# Patient Record
Sex: Female | Born: 1945
Health system: Southern US, Community
[De-identification: ages and names within clinical notes are randomized; demographics above are authoritative.]

## PROBLEM LIST (undated history)

## (undated) DIAGNOSIS — E213 Hyperparathyroidism, unspecified: Secondary | ICD-10-CM

## (undated) DIAGNOSIS — E785 Hyperlipidemia, unspecified: Secondary | ICD-10-CM

## (undated) DIAGNOSIS — N183 Chronic kidney disease, stage 3 unspecified: Secondary | ICD-10-CM

## (undated) DIAGNOSIS — I10 Essential (primary) hypertension: Secondary | ICD-10-CM

## (undated) DIAGNOSIS — I700 Atherosclerosis of aorta: Secondary | ICD-10-CM

## (undated) DIAGNOSIS — I498 Other specified cardiac arrhythmias: Secondary | ICD-10-CM

## (undated) DIAGNOSIS — M810 Age-related osteoporosis without current pathological fracture: Secondary | ICD-10-CM

## (undated) DIAGNOSIS — K219 Gastro-esophageal reflux disease without esophagitis: Secondary | ICD-10-CM

## (undated) DIAGNOSIS — Z8673 Personal history of transient ischemic attack (TIA), and cerebral infarction without residual deficits: Secondary | ICD-10-CM

## (undated) DIAGNOSIS — M1990 Unspecified osteoarthritis, unspecified site: Secondary | ICD-10-CM

## (undated) DIAGNOSIS — F419 Anxiety disorder, unspecified: Secondary | ICD-10-CM

## (undated) DIAGNOSIS — R002 Palpitations: Secondary | ICD-10-CM

## (undated) DIAGNOSIS — E041 Nontoxic single thyroid nodule: Secondary | ICD-10-CM

## (undated) HISTORY — DX: Essential (primary) hypertension: I10

## (undated) HISTORY — PX: COLONOSCOPY: SHX174

## (undated) HISTORY — DX: Hyperlipidemia, unspecified: E78.5

## (undated) HISTORY — DX: Anxiety disorder, unspecified: F41.9

## (undated) HISTORY — DX: Gastro-esophageal reflux disease without esophagitis: K21.9

## (undated) HISTORY — PX: ESOPHAGOGASTRODUODENOSCOPY: SHX1529

## (undated) NOTE — *Deleted (*Deleted)
Health Maintenance Due  Topic Date Due  . COLONOSCOPY  05/31/2019  . INFLUENZA VACCINE  08/08/2019   Depression screen [facility] 2/9 07/19/2019 04/15/2019 10/29/2018  Decreased Interest 0 0 0  Down, Depressed, Hopeless 0 0 0  PHQ - 2 Score 0 0 0  Altered sleeping 0 0 0  Tired, decreased energy 0 0 3  Change in appetite 0 0 0  Feeling bad or failure about yourself  0 0 0  Trouble concentrating 0 0 0  Moving slowly or fidgety/restless 0 0 0  Suicidal thoughts 0 0 0  PHQ-9 Score 0 0 3  Difficult doing work/chores Not difficult at all Not difficult at all Not difficult at all  Some recent data might be hidden

---

## 1997-04-14 ENCOUNTER — Ambulatory Visit (HOSPITAL_COMMUNITY): Admission: RE | Admit: 1997-04-14 | Discharge: 1997-04-14 | Payer: Self-pay | Admitting: Obstetrics and Gynecology

## 1997-09-19 ENCOUNTER — Other Ambulatory Visit: Admission: RE | Admit: 1997-09-19 | Discharge: 1997-09-19 | Payer: Self-pay | Admitting: Gynecology

## 1998-03-29 ENCOUNTER — Other Ambulatory Visit: Admission: RE | Admit: 1998-03-29 | Discharge: 1998-03-29 | Payer: Self-pay | Admitting: Gynecology

## 1998-12-13 ENCOUNTER — Encounter: Payer: Self-pay | Admitting: *Deleted

## 1998-12-13 ENCOUNTER — Encounter: Admission: RE | Admit: 1998-12-13 | Discharge: 1998-12-13 | Payer: Self-pay | Admitting: *Deleted

## 1998-12-19 ENCOUNTER — Other Ambulatory Visit: Admission: RE | Admit: 1998-12-19 | Discharge: 1998-12-19 | Payer: Self-pay | Admitting: Gynecology

## 1999-12-05 ENCOUNTER — Encounter: Payer: Self-pay | Admitting: *Deleted

## 1999-12-05 ENCOUNTER — Encounter: Admission: RE | Admit: 1999-12-05 | Discharge: 1999-12-05 | Payer: Self-pay | Admitting: *Deleted

## 2000-03-25 ENCOUNTER — Other Ambulatory Visit: Admission: RE | Admit: 2000-03-25 | Discharge: 2000-03-25 | Payer: Self-pay | Admitting: Gynecology

## 2001-02-16 ENCOUNTER — Observation Stay (HOSPITAL_COMMUNITY): Admission: RE | Admit: 2001-02-16 | Discharge: 2001-02-17 | Payer: Self-pay | Admitting: Neurosurgery

## 2001-04-21 ENCOUNTER — Other Ambulatory Visit: Admission: RE | Admit: 2001-04-21 | Discharge: 2001-04-21 | Payer: Self-pay | Admitting: Gynecology

## 2002-01-07 HISTORY — PX: CERVICAL LAMINECTOMY: SHX94

## 2002-06-15 ENCOUNTER — Other Ambulatory Visit: Admission: RE | Admit: 2002-06-15 | Discharge: 2002-06-15 | Payer: Self-pay | Admitting: Gynecology

## 2003-07-21 ENCOUNTER — Other Ambulatory Visit: Admission: RE | Admit: 2003-07-21 | Discharge: 2003-07-21 | Payer: Self-pay | Admitting: Gynecology

## 2004-04-02 ENCOUNTER — Ambulatory Visit: Payer: Self-pay | Admitting: Gastroenterology

## 2004-08-30 ENCOUNTER — Other Ambulatory Visit: Admission: RE | Admit: 2004-08-30 | Discharge: 2004-08-30 | Payer: Self-pay | Admitting: Gynecology

## 2005-09-16 ENCOUNTER — Encounter: Admission: RE | Admit: 2005-09-16 | Discharge: 2005-09-16 | Payer: Self-pay | Admitting: Internal Medicine

## 2006-05-26 ENCOUNTER — Ambulatory Visit: Payer: Self-pay | Admitting: Internal Medicine

## 2006-05-26 DIAGNOSIS — K219 Gastro-esophageal reflux disease without esophagitis: Secondary | ICD-10-CM | POA: Insufficient documentation

## 2006-05-26 DIAGNOSIS — I10 Essential (primary) hypertension: Secondary | ICD-10-CM | POA: Insufficient documentation

## 2006-05-26 DIAGNOSIS — E785 Hyperlipidemia, unspecified: Secondary | ICD-10-CM | POA: Insufficient documentation

## 2006-05-26 DIAGNOSIS — F411 Generalized anxiety disorder: Secondary | ICD-10-CM | POA: Insufficient documentation

## 2006-05-27 LAB — CONVERTED CEMR LAB
ALT: 20 units/L (ref 0–40)
AST: 21 units/L (ref 0–37)
Albumin: 3.9 g/dL (ref 3.5–5.2)
Alkaline Phosphatase: 62 units/L (ref 39–117)
BUN: 8 mg/dL (ref 6–23)
Bilirubin, Direct: 0.1 mg/dL (ref 0.0–0.3)
CO2: 28 meq/L (ref 19–32)
Calcium: 10 mg/dL (ref 8.4–10.5)
Chloride: 109 meq/L (ref 96–112)
Cholesterol: 162 mg/dL (ref 0–200)
Creatinine, Ser: 0.8 mg/dL (ref 0.4–1.2)
Direct LDL: 93.5 mg/dL
GFR calc Af Amer: 94 mL/min
GFR calc non Af Amer: 78 mL/min
Glucose, Bld: 135 mg/dL — ABNORMAL HIGH (ref 70–99)
HDL: 38.3 mg/dL — ABNORMAL LOW (ref >39.0)
Potassium: 3.7 meq/L (ref 3.5–5.1)
Sodium: 141 meq/L (ref 135–145)
Total Bilirubin: 0.6 mg/dL (ref 0.3–1.2)
Total CHOL/HDL Ratio: 4.2
Total Protein: 6.5 g/dL (ref 6.0–8.3)
Triglycerides: 271 mg/dL (ref 0–149)
VLDL: 54 mg/dL — ABNORMAL HIGH (ref 0–40)

## 2006-07-03 ENCOUNTER — Ambulatory Visit: Payer: Self-pay | Admitting: Internal Medicine

## 2006-10-23 ENCOUNTER — Ambulatory Visit (HOSPITAL_COMMUNITY): Admission: RE | Admit: 2006-10-23 | Discharge: 2006-10-23 | Payer: Self-pay | Admitting: Plastic Surgery

## 2006-10-23 ENCOUNTER — Encounter (INDEPENDENT_AMBULATORY_CARE_PROVIDER_SITE_OTHER): Payer: Self-pay | Admitting: Gynecology

## 2006-12-16 ENCOUNTER — Ambulatory Visit: Payer: Self-pay | Admitting: Internal Medicine

## 2006-12-18 ENCOUNTER — Ambulatory Visit: Payer: Self-pay | Admitting: Internal Medicine

## 2006-12-18 LAB — CONVERTED CEMR LAB
ALT: 20 units/L (ref 0–35)
AST: 20 units/L (ref 0–37)
Albumin: 3.8 g/dL (ref 3.5–5.2)
Alkaline Phosphatase: 62 units/L (ref 39–117)
BUN: 8 mg/dL (ref 6–23)
Basophils Absolute: 0 10*3/uL (ref 0.0–0.1)
Basophils Relative: 0.1 % (ref 0.0–1.0)
Bilirubin, Direct: 0.2 mg/dL (ref 0.0–0.3)
CO2: 29 meq/L (ref 19–32)
Calcium: 10.6 mg/dL — ABNORMAL HIGH (ref 8.4–10.5)
Chloride: 106 meq/L (ref 96–112)
Cholesterol: 145 mg/dL (ref 0–200)
Creatinine, Ser: 0.9 mg/dL (ref 0.4–1.2)
Eosinophils Absolute: 0.2 10*3/uL (ref 0.0–0.6)
Eosinophils Relative: 2.6 % (ref 0.0–5.0)
GFR calc Af Amer: 82 mL/min
GFR calc non Af Amer: 67 mL/min
Glucose, Bld: 100 mg/dL — ABNORMAL HIGH (ref 70–99)
Glucose, Urine, Semiquant: NEGATIVE
HCT: 46 % (ref 36.0–46.0)
HDL: 28.7 mg/dL — ABNORMAL LOW (ref >39.0)
Hemoglobin: 16.2 g/dL — ABNORMAL HIGH (ref 12.0–15.0)
LDL Cholesterol: 87 mg/dL (ref 0–99)
Lymphocytes Relative: 17.3 % (ref 12.0–46.0)
MCHC: 35.3 g/dL (ref 30.0–36.0)
MCV: 96 fL (ref 78.0–100.0)
Monocytes Absolute: 0.6 10*3/uL (ref 0.2–0.7)
Monocytes Relative: 6.3 % (ref 3.0–11.0)
Neutro Abs: 6.6 10*3/uL (ref 1.4–7.7)
Neutrophils Relative %: 73.7 % (ref 43.0–77.0)
Nitrite: NEGATIVE
Platelets: 248 10*3/uL (ref 150–400)
Potassium: 5 meq/L (ref 3.5–5.1)
RBC: 4.78 M/uL (ref 3.87–5.11)
RDW: 12.7 % (ref 11.5–14.6)
Sodium: 142 meq/L (ref 135–145)
Specific Gravity, Urine: >=1.03
TSH: 1.93 microintl units/mL (ref 0.35–5.50)
Total Bilirubin: 0.7 mg/dL (ref 0.3–1.2)
Total CHOL/HDL Ratio: 5.1
Total Protein: 6.7 g/dL (ref 6.0–8.3)
Triglycerides: 149 mg/dL (ref 0–149)
Urobilinogen, UA: 2
VLDL: 30 mg/dL (ref 0–40)
WBC Urine, dipstick: NEGATIVE
WBC: 9 10*3/uL (ref 4.5–10.5)
pH: 7

## 2006-12-24 ENCOUNTER — Ambulatory Visit: Payer: Self-pay | Admitting: Internal Medicine

## 2006-12-24 DIAGNOSIS — Z87891 Personal history of nicotine dependence: Secondary | ICD-10-CM | POA: Insufficient documentation

## 2006-12-24 DIAGNOSIS — F17200 Nicotine dependence, unspecified, uncomplicated: Secondary | ICD-10-CM

## 2007-01-26 ENCOUNTER — Telehealth (INDEPENDENT_AMBULATORY_CARE_PROVIDER_SITE_OTHER): Payer: Self-pay | Admitting: *Deleted

## 2007-01-28 ENCOUNTER — Ambulatory Visit: Payer: Self-pay | Admitting: Internal Medicine

## 2007-02-12 ENCOUNTER — Ambulatory Visit: Payer: Self-pay | Admitting: Gastroenterology

## 2007-02-26 ENCOUNTER — Ambulatory Visit: Payer: Self-pay | Admitting: Gastroenterology

## 2007-02-26 ENCOUNTER — Encounter: Payer: Self-pay | Admitting: Gastroenterology

## 2007-02-26 ENCOUNTER — Encounter: Payer: Self-pay | Admitting: Internal Medicine

## 2007-03-10 ENCOUNTER — Telehealth: Payer: Self-pay | Admitting: Internal Medicine

## 2007-03-12 ENCOUNTER — Telehealth: Payer: Self-pay | Admitting: *Deleted

## 2007-03-12 ENCOUNTER — Emergency Department (HOSPITAL_COMMUNITY): Admission: EM | Admit: 2007-03-12 | Discharge: 2007-03-12 | Payer: Self-pay | Admitting: Emergency Medicine

## 2007-03-13 ENCOUNTER — Ambulatory Visit: Payer: Self-pay | Admitting: Internal Medicine

## 2007-03-13 LAB — HM COLONOSCOPY

## 2007-03-16 ENCOUNTER — Encounter: Payer: Self-pay | Admitting: Internal Medicine

## 2007-03-16 ENCOUNTER — Ambulatory Visit: Payer: Self-pay | Admitting: Gastroenterology

## 2007-03-16 ENCOUNTER — Encounter: Payer: Self-pay | Admitting: Gastroenterology

## 2007-05-27 ENCOUNTER — Ambulatory Visit: Payer: Self-pay | Admitting: Internal Medicine

## 2007-06-29 ENCOUNTER — Encounter: Payer: Self-pay | Admitting: Internal Medicine

## 2007-10-20 ENCOUNTER — Telehealth: Payer: Self-pay | Admitting: Internal Medicine

## 2007-11-02 ENCOUNTER — Ambulatory Visit: Payer: Self-pay | Admitting: Internal Medicine

## 2007-11-02 LAB — CONVERTED CEMR LAB
ALT: 36 units/L — ABNORMAL HIGH (ref 0–35)
AST: 32 units/L (ref 0–37)
Alkaline Phosphatase: 58 units/L (ref 39–117)
Bilirubin, Direct: 0.1 mg/dL (ref 0.0–0.3)
CO2: 28 meq/L (ref 19–32)
Calcium: 10.8 mg/dL — ABNORMAL HIGH (ref 8.4–10.5)
Chloride: 107 meq/L (ref 96–112)
Glucose, Bld: 111 mg/dL — ABNORMAL HIGH (ref 70–99)
HDL: 56.5 mg/dL (ref >39.0)
Potassium: 4.9 meq/L (ref 3.5–5.1)
Sodium: 143 meq/L (ref 135–145)
Total Bilirubin: 0.6 mg/dL (ref 0.3–1.2)
Total CHOL/HDL Ratio: 3.2
Total Protein: 6.9 g/dL (ref 6.0–8.3)

## 2007-11-26 ENCOUNTER — Ambulatory Visit: Payer: Self-pay | Admitting: Internal Medicine

## 2007-12-01 ENCOUNTER — Telehealth: Payer: Self-pay | Admitting: Internal Medicine

## 2008-01-28 ENCOUNTER — Ambulatory Visit: Payer: Self-pay | Admitting: Internal Medicine

## 2008-06-01 ENCOUNTER — Emergency Department (HOSPITAL_COMMUNITY): Admission: EM | Admit: 2008-06-01 | Discharge: 2008-06-02 | Payer: Self-pay | Admitting: Emergency Medicine

## 2008-06-07 ENCOUNTER — Ambulatory Visit: Payer: Self-pay | Admitting: Family Medicine

## 2008-07-18 ENCOUNTER — Ambulatory Visit: Payer: Self-pay | Admitting: Internal Medicine

## 2008-08-22 ENCOUNTER — Encounter: Payer: Self-pay | Admitting: Internal Medicine

## 2008-10-17 ENCOUNTER — Ambulatory Visit: Payer: Self-pay | Admitting: Internal Medicine

## 2008-10-20 LAB — CONVERTED CEMR LAB
BUN: 18 mg/dL (ref 6–23)
Basophils Absolute: 0 10*3/uL (ref 0.0–0.1)
CO2: 28 meq/L (ref 19–32)
Calcium: 10.7 mg/dL — ABNORMAL HIGH (ref 8.4–10.5)
Chloride: 102 meq/L (ref 96–112)
Cholesterol: 212 mg/dL — ABNORMAL HIGH (ref 0–200)
Creatinine, Ser: 1.2 mg/dL (ref 0.4–1.2)
Eosinophils Absolute: 0.2 10*3/uL (ref 0.0–0.7)
GFR calc non Af Amer: 48.07 mL/min (ref >60)
Glucose, Bld: 87 mg/dL (ref 70–99)
HDL: 67.4 mg/dL (ref >39.00)
Hemoglobin: 12.2 g/dL (ref 12.0–15.0)
Lymphocytes Relative: 23.7 % (ref 12.0–46.0)
MCHC: 35.4 g/dL (ref 30.0–36.0)
Neutro Abs: 3.7 10*3/uL (ref 1.4–7.7)
Neutrophils Relative %: 64 % (ref 43.0–77.0)
RDW: 11.5 % (ref 11.5–14.6)
VLDL: 30 mg/dL (ref 0.0–40.0)

## 2009-01-11 ENCOUNTER — Ambulatory Visit: Payer: Self-pay | Admitting: Internal Medicine

## 2009-01-11 LAB — CONVERTED CEMR LAB: Calcium Ionized: 1.45 mmol/L — ABNORMAL HIGH (ref 1.12–1.32)

## 2009-02-20 LAB — CONVERTED CEMR LAB: Calcium: 10.5 mg/dL

## 2009-03-22 ENCOUNTER — Ambulatory Visit: Payer: Self-pay | Admitting: Internal Medicine

## 2009-03-22 LAB — CONVERTED CEMR LAB: Calcium, Total (PTH): 10.5 mg/dL (ref 8.4–10.5)

## 2009-03-24 LAB — CONVERTED CEMR LAB
BUN: 19 mg/dL (ref 6–23)
CO2: 29 meq/L (ref 19–32)
Calcium: 10.6 mg/dL — ABNORMAL HIGH (ref 8.4–10.5)
Creatinine, Ser: 1.2 mg/dL (ref 0.4–1.2)
Glucose, Bld: 94 mg/dL (ref 70–99)

## 2009-07-26 ENCOUNTER — Ambulatory Visit: Payer: Self-pay | Admitting: Internal Medicine

## 2009-07-26 LAB — CONVERTED CEMR LAB
BUN: 18 mg/dL (ref 6–23)
CO2: 29 meq/L (ref 19–32)
Calcium: 10.8 mg/dL — ABNORMAL HIGH (ref 8.4–10.5)
Creatinine, Ser: 0.9 mg/dL (ref 0.4–1.2)
Glucose, Bld: 95 mg/dL (ref 70–99)

## 2009-09-27 ENCOUNTER — Encounter: Payer: Self-pay | Admitting: Internal Medicine

## 2009-09-29 ENCOUNTER — Telehealth: Payer: Self-pay | Admitting: Internal Medicine

## 2009-10-18 ENCOUNTER — Ambulatory Visit: Payer: Self-pay | Admitting: Internal Medicine

## 2009-10-20 LAB — CONVERTED CEMR LAB
Albumin: 4.1 g/dL (ref 3.5–5.2)
Alkaline Phosphatase: 56 units/L (ref 39–117)
BUN: 15 mg/dL (ref 6–23)
Bilirubin, Direct: 0.1 mg/dL (ref 0.0–0.3)
CO2: 30 meq/L (ref 19–32)
Calcium: 10.5 mg/dL (ref 8.4–10.5)
Creatinine, Ser: 0.9 mg/dL (ref 0.4–1.2)
HDL: 63.8 mg/dL (ref >39.00)
Total CHOL/HDL Ratio: 4
VLDL: 55.8 mg/dL — ABNORMAL HIGH (ref 0.0–40.0)

## 2009-12-28 ENCOUNTER — Telehealth: Payer: Self-pay | Admitting: Internal Medicine

## 2010-01-12 ENCOUNTER — Ambulatory Visit
Admission: RE | Admit: 2010-01-12 | Discharge: 2010-01-12 | Payer: Self-pay | Source: Home / Self Care | Attending: Internal Medicine | Admitting: Internal Medicine

## 2010-02-06 NOTE — Progress Notes (Signed)
Summary: 90 day supply rx  Phone Note Call from Patient Call back at Home Phone [phone]   Caller: Patient Call For: Birdie Sons MD Summary of Call: pt needs 90 day supply crestor 40mg ,protonix 40 mg,celexa 10mg , wellbutrin 150 mg and lisinopril 40 mg call into [place] [phone] Initial call taken by: Heron Sabins,  September 29, 2009 3:21 PM  Follow-up for Phone Call        ok x 1 needs OV before refilled again Follow-up by: Birdie Sons MD,  September 29, 2009 3:59 PM    Prescriptions: BUDEPRION XL 150 MG  TB24 (BUPROPION HCL) 1 by mouth daily  #90 x 0   Entered by:   Lynann Beaver CMA   Authorized by:   Birdie Sons MD   Signed by:   Lynann Beaver CMA on 09/29/2009   Method used:   Electronically to        [facility]* (retail)       [address]       [place], [place]  [address]       Ph: [phone]       Fax: [phone]   RxID:   [id number] CITALOPRAM HYDROBROMIDE 10 MG  TABS (CITALOPRAM HYDROBROMIDE) Take 1 tab by mouth daily  #90 x 0   Entered by:   Lynann Beaver CMA   Authorized by:   Birdie Sons MD   Signed by:   Lynann Beaver CMA on 09/29/2009   Method used:   Electronically to        [facility]* (retail)       [address]       [place], [place]  [address]       Ph: [phone]       Fax: [phone]   RxID:   [id number] PROTONIX 40 MG  TBEC (PANTOPRAZOLE SODIUM) once daily  #90 Each x 0   Entered by:   Lynann Beaver CMA   Authorized by:   Birdie Sons MD   Signed by:   Lynann Beaver CMA on 09/29/2009   Method used:   Electronically to        [facility]* (retail)       [address]       [place], [place]  [id number]       Ph: [phone]       Fax: [phone]   RxID:   [id number] CRESTOR 40 MG TABS (ROSUVASTATIN CALCIUM) Take 1 tablet by mouth once a day  #90 x 0   Entered by:   Lynann Beaver CMA   Authorized by:   Birdie Sons MD   Signed by:   Lynann Beaver CMA on  09/29/2009   Method used:   Electronically to        [facility]* (retail)       [address]       [place], [place]  [address]       Ph: [phone]       Fax: [phone]   RxID:   [id number] LISINOPRIL 40 MG TABS (LISINOPRIL) 1 by mouth once daily  #90 Each x 0   Entered by:   Lynann Beaver CMA   Authorized by:   Birdie Sons MD   Signed by:   Lynann Beaver CMA on 09/29/2009   Method used:   Electronically to        [facility]* (retail)       [address]  [place], [place]  [address]       Ph: [phone]       Fax: [phone]   RxID:   [id number]

## 2010-02-06 NOTE — Assessment & Plan Note (Signed)
Summary: FU/et--Rm 14   Vital Signs:  Patient profile:   65 year old female Height:      64.5 inches Weight:      151 pounds BMI:     25.61 Temp:     97.6 degrees F oral Pulse rate:   78 / minute Pulse rhythm:   regular Resp:     18 per minute BP sitting:   120 / 80  (left arm) Cuff size:   regular  Vitals Entered By: Mervin Kung CMA Duncan Dull) (October 18, 2009 11:12 AM) CC: Rm 14  Follow up. Pt would like to have Wellbutrin dose increased. Is Patient Diabetic? No Pain Assessment Patient in pain? no      Comments Pt states all med doses and directions are correct. Nicki Guadalajara Fergerson CMA Duncan Dull)  October 18, 2009 11:16 AM    CC:  Rm 14  Follow up. Pt would like to have Wellbutrin dose increased.Marland Kitchen  History of Present Illness: She felt better on budeprion 300 mg   htn---tolerating meds  lipids---doing well on crestor  Allergies: 1)  * Antihistamines - Alkylamine Group 2)  Sulfamethoxazole (Sulfamethoxazole)  Past History:  Past Medical History: Last updated: 05/26/2006 Hyperlipidemia Hypertension GERD Anxiety  Past Surgical History: Last updated: 11/26/2007 Cervical laminectomy-Jeff Jenkins--2004  Family History: Last updated: 06/05/2007 Father deceased 3yo MI (paternal relatives with early heart disease) Mother deceased 32 melanoma Family History of Melanoma multiple paternal relatives with MI  Social History: Last updated: 05/26/2006 husband with melanoma Current Smoker ETOH  Risk Factors: Alcohol Use: 3 (05/26/2006)  Risk Factors: Smoking Status: current (10/17/2008) Packs/Day: <1 ppd (10/17/2008)  Physical Exam  General:  alert and well-developed.   Head:  normocephalic and atraumatic.   Eyes:  pupils equal and pupils round.   Ears:  R ear normal and L ear normal.   Neck:  No deformities, masses, or tenderness noted. Chest Wall:  No deformities, masses, or tenderness noted. Lungs:  normal respiratory effort and no intercostal  retractions.   Heart:  normal rate and regular rhythm.   Abdomen:  soft and non-tender.   Skin:  turgor normal and color normal.   Psych:  normally interactive and good eye contact.     Impression & Recommendations:  Problem # 1:  HYPERTENSION (ICD-401.9) controlled continue current medications  Her updated medication list for this problem includes:    Lisinopril 40 Mg Tabs (Lisinopril) .Marland Kitchen... 1 by mouth once daily  BP today: 120/80 Prior BP: 110/60 (07/18/2008)  Labs Reviewed: K+: 4.3 (07/26/2009) Creat: : 0.9 (07/26/2009)   Chol: 212 (10/17/2008)   HDL: 67.40 (10/17/2008)   LDL: 92 (11/02/2007)   TG: 150.0 (10/17/2008)  Orders: Specimen Handling (16109) TLB-Renal Function Panel (80069-RENAL)  Problem # 2:  HYPERLIPIDEMIA (ICD-272.4) controlled continue current medications  Her updated medication list for this problem includes:    Crestor 40 Mg Tabs (Rosuvastatin calcium) .Marland Kitchen... Take 1 tablet by mouth once a day  Labs Reviewed: SGOT: 32 (11/02/2007)   SGPT: 36 (11/02/2007)   HDL:67.40 (10/17/2008), 56.5 (11/02/2007)  LDL:92 (11/02/2007), 87 (12/18/2006)  Chol:212 (10/17/2008), 180 (11/02/2007)  Trig:150.0 (10/17/2008), 159 (11/02/2007)  Orders: Venipuncture (60454) Specimen Handling (09811) TLB-Lipid Panel (80061-LIPID) TLB-Hepatic/Liver Function Pnl (80076-HEPATIC) TLB-TSH (Thyroid Stimulating Hormone) (84443-TSH)  Problem # 3:  ANXIETY (ICD-300.00) see meds discussed side effects Her updated medication list for this problem includes:    Alprazolam 0.25 Mg Tabs (Alprazolam) .Marland Kitchen... Take 1 tablet by mouth once a day as needed    Budeprion Xl  300 Mg Tb24 (Bupropion hcl) .Marland Kitchen... 1 by mouth daily    Citalopram Hydrobromide 10 Mg Tabs (Citalopram hydrobromide) .Marland Kitchen... Take 1 tab by mouth daily  Orders: Specimen Handling (16109)  Problem # 4:  TOBACCO USE (ICD-305.1)  Encouraged smoking cessation and discussed different methods for smoking cessation.   Complete  Medication List: 1)  Alprazolam 0.25 Mg Tabs (Alprazolam) .... Take 1 tablet by mouth once a day as needed 2)  Lisinopril 40 Mg Tabs (Lisinopril) .Marland Kitchen.. 1 by mouth once daily 3)  Aspir-81 81 Mg Tbec (Aspirin) .... Take 1 tablet by mouth once a day 4)  Crestor 40 Mg Tabs (Rosuvastatin calcium) .... Take 1 tablet by mouth once a day 5)  Budeprion Xl 300 Mg Tb24 (Bupropion hcl) .Marland Kitchen.. 1 by mouth daily 6)  Protonix 40 Mg Tbec (Pantoprazole sodium) .... Once daily 7)  Zovirax 5 % Crea (Acyclovir) .... Use qid as needed 8)  Estrovan  .... Qd 9)  Citalopram Hydrobromide 10 Mg Tabs (Citalopram hydrobromide) .... Take 1 tab by mouth daily  Other Orders: Tdap => 53yrs IM (60454) Admin 1st Vaccine (09811) Flu Vaccine 8yrs + MEDICARE PATIENTS (B1478) Administration Flu vaccine - MCR (G0008) Zoster (Shingles) Vaccine Live (29562) Admin of Any Addtl Vaccine (13086)  Patient Instructions: 1)  . Prescriptions: BUDEPRION XL 300 MG TB24 (BUPROPION HCL) 1 by mouth daily  #90 x 3   Entered and Authorized by:   Birdie Sons MD   Signed by:   Birdie Sons MD on 10/18/2009   Method used:   Electronically to        [facility]* (retail)       [address]       [place], [place]  [address]       Ph: [phone]       Fax: [phone]   RxID:   [id number]   Current Allergies (reviewed today): * ANTIHISTAMINES - ALKYLAMINE GROUP SULFAMETHOXAZOLE (SULFAMETHOXAZOLE)   Orders Added: 1)  Venipuncture [43329] 2)  Est. Patient Level IV [51884] 3)  Specimen Handling [99000] 4)  Tdap => 71yrs IM [90715] 5)  Admin 1st Vaccine [90471] 6)  Flu Vaccine 41yrs + MEDICARE PATIENTS [Q2039] 7)  Administration Flu vaccine - MCR [G0008] 8)  Zoster (Shingles) Vaccine Live [90736] 9)  Admin of Any Addtl Vaccine [90472] 10)  TLB-Renal Function Panel [80069-RENAL] 11)  TLB-Lipid Panel [80061-LIPID] 12)  TLB-Hepatic/Liver Function Pnl [80076-HEPATIC] 13)  TLB-TSH (Thyroid Stimulating Hormone)  [16606-TKZ]    Immunizations Administered:  Tetanus Vaccine:    Vaccine Type: Tdap    Site: right deltoid    Mfr: GlaxoSmithKline    Dose: 0.5 ml    Route: IM    Given by: Mervin Kung CMA (AAMA)    Exp. Date: 10/27/2011    Lot #: [id number]    VIS given: 11/25/07 version given October 18, 2009.  Zostavax # 1:    Vaccine Type: Zostavax    Site: right thigh    Mfr: Merck    Dose: 0.5 ml    Route: [place]    Given by: Mervin Kung CMA (AAMA)    Exp. Date: 08/25/2010    Lot #: [id number]    VIS given: 10/19/04 given October 18, 2009.   Flu Vaccine Consent Questions     Do you have a history of severe allergic reactions to this vaccine? no    Any prior history of allergic reactions to egg and/or gelatin? no    Do you have a sensitivity  to the preservative Thimersol? no    Do you have a past history of Guillan-Barre Syndrome? no    Do you currently have an acute febrile illness? no    Have you ever had a severe reaction to latex? no    Vaccine information given and explained to patient? yes    Are you currently pregnant? no    Lot Number:[id number]   Exp Date:07/07/2010   Site Given  Left Deltoid IM. Nicki Guadalajara Fergerson CMA Duncan Dull)  October 18, 2009 12:14 PM

## 2010-02-07 ENCOUNTER — Other Ambulatory Visit: Payer: Self-pay | Admitting: Internal Medicine

## 2010-02-07 DIAGNOSIS — I10 Essential (primary) hypertension: Secondary | ICD-10-CM

## 2010-02-07 MED ORDER — LISINOPRIL 40 MG PO TABS: 40.0000 mg | ORAL_TABLET | Freq: Every day | ORAL | Status: DC

## 2010-02-08 NOTE — Progress Notes (Signed)
Summary: med refill  Phone Note Refill Request Message from:  Patient  Refills Requested: Medication #1:  ALPRAZOLAM 0.25 MG TABS Take 1 tablet by mouth once a day as needed pt needs refill  Initial call taken by: Heron Sabins,  December 28, 2009 9:49 AM  Follow-up for Phone Call        Rx called to pharmacy Follow-up by: Alfred Levins, CMA,  December 28, 2009 9:57 AM    Prescriptions: ALPRAZOLAM 0.25 MG TABS (ALPRAZOLAM) Take 1 tablet by mouth once a day as needed  #30 x 0   Entered by:   Alfred Levins, CMA   Authorized by:   Birdie Sons MD   Signed by:   Alfred Levins, CMA on 12/28/2009   Method used:   Telephoned to ...       [facility]* (retail)       [address]       [place], [place]  [address]       Ph: [phone]       Fax: [phone]   RxID:   [id number]

## 2010-02-08 NOTE — Assessment & Plan Note (Signed)
Summary: bloody diarrhea this a.m./cjr   Vital Signs:  Patient profile:   65 year old female Weight:      147 pounds Temp:     98.7 degrees F oral Pulse rate:   80 / minute Pulse rhythm:   regular BP sitting:   112 / 66  (left arm) Cuff size:   regular  Vitals Entered By: Alfred Levins, CMA (January 12, 2010 10:39 AM) CC: diarrhea with blood onset this morning   CC:  diarrhea with blood onset this morning.  History of Present Illness: This a.m. had one episode of loose stool today---some watery. she is concerned that there may have been some blood.  she denies any unusual foods. She is unsure whether there was blood in the stool this morning. She denies any fevers or chills. She denies any unusual foods or recent travel. She otherwise feels well. No other complaints in a complete review of systems.  Current Medications (verified): 1)  Alprazolam 0.25 Mg Tabs (Alprazolam) .... Take 1 Tablet By Mouth Once A Day As Needed 2)  Lisinopril 40 Mg Tabs (Lisinopril) .Marland Kitchen.. 1 By Mouth Once Daily 3)  Aspir-81 81 Mg Tbec (Aspirin) .... Take 1 Tablet By Mouth Once A Day 4)  Crestor 40 Mg Tabs (Rosuvastatin Calcium) .... Take 1 Tablet By Mouth Once A Day 5)  Budeprion Xl 300 Mg Tb24 (Bupropion Hcl) .Marland Kitchen.. 1 By Mouth Daily 6)  Protonix 40 Mg  Tbec (Pantoprazole Sodium) .... Once Daily 7)  Zovirax 5 %  Crea (Acyclovir) .... Use Qid As Needed 8)  Estrovan .... Qd 9)  Citalopram Hydrobromide 10 Mg  Tabs (Citalopram Hydrobromide) .... Take 1 Tab By Mouth Daily  Allergies (verified): 1)  * Antihistamines - Alkylamine Group 2)  Sulfamethoxazole (Sulfamethoxazole)  Past History:  Past Medical History: Last updated: 05/26/2006 Hyperlipidemia Hypertension GERD Anxiety  Past Surgical History: Last updated: 11/26/2007 Cervical laminectomy-Jeff Jenkins--2004  Family History: Last updated: 06-01-07 Father deceased 6yo MI (paternal relatives with early heart disease) Mother deceased 73  melanoma Family History of Melanoma multiple paternal relatives with MI  Social History: Last updated: 05/26/2006 husband with melanoma Current Smoker ETOH  Risk Factors: Alcohol Use: 3 (05/26/2006)  Risk Factors: Smoking Status: current (10/17/2008) Packs/Day: <1 ppd (10/17/2008)  Physical Exam  General:   well-developed well-nourished female in no acute distress. HEENT exam atraumatic, normocephalic. Conjunctiva are pink. neck is supple without jugular venous distention. chest clear to auscultation cardiac exam S1-S2 are regular. Abdominal exam active bowel sounds, soft. Extremities no edema.   Impression & Recommendations:  Problem # 1:  DIARRHEA (ICD-787.91) suspect will self resolve  if symptoms persist she will need to see gastroenterology. reviewed colonoscopy.  Complete Medication List: 1)  Alprazolam 0.25 Mg Tabs (Alprazolam) .... Take 1 tablet by mouth once a day as needed 2)  Lisinopril 40 Mg Tabs (Lisinopril) .Marland Kitchen.. 1 by mouth once daily 3)  Aspir-81 81 Mg Tbec (Aspirin) .... Take 1 tablet by mouth once a day 4)  Crestor 40 Mg Tabs (Rosuvastatin calcium) .... Take 1 tablet by mouth once a day 5)  Budeprion Xl 300 Mg Tb24 (Bupropion hcl) .Marland Kitchen.. 1 by mouth daily 6)  Protonix 40 Mg Tbec (Pantoprazole sodium) .... Once daily 7)  Zovirax 5 % Crea (Acyclovir) .... Use qid as needed 8)  Estrovan  .... Qd 9)  Citalopram Hydrobromide 10 Mg Tabs (Citalopram hydrobromide) .... Take 1 tab by mouth daily   Orders Added: 1)  Est. Patient Level III [  99213] 

## 2010-05-22 NOTE — Assessment & Plan Note (Signed)
[facility]                         GASTROENTEROLOGY OFFICE NOTE   [name], [name]                      MRN:          [id number]  DATE:02/12/2007                            DOB:          12/06/1945    PROBLEM AND REASON:  Crystal Carroll is here to schedule colonoscopy and upper  endoscopy.  She was seen in March 2006 to schedule colonoscopy, which  she did not do.  She has no lower GI complaints.  She had long standing  GERD characterized by pyrosis, which is well controlled with Protonix.  She denies dysphagia, cough or chest discomfort.   MEDICATIONS:  Include Xanax, Crestor, baby aspirin, Wellbutrin,  lisinopril, Estratest and Protonix.   EXAMINATION:  Pulse 64, blood pressure 130/80, weight 192.   IMPRESSION:  Gastroesophageal reflux disease.  Barrett's esophagus ought  to be ruled out.   RECOMMENDATIONS:  Screening colonoscopy and upper endoscopy.     Barbette Hair. Arlyce Dice, MD,FACG  Electronically Signed    RDK/MedQ  DD: 02/12/2007  DT: 02/12/2007  Job #: [id number]   cc:   Valetta Mole. Swords, MD

## 2010-05-22 NOTE — Op Note (Signed)
NAME:  Crystal Carroll, Crystal Carroll NO.:  [id number]   MEDICAL RECORD NO.:  [id number]          PATIENT TYPE:  AMB   LOCATION:  SDC                           FACILITY:  [facility]   PHYSICIAN:  Luvenia Redden, M.D.   DATE OF BIRTH:  1945-10-04   DATE OF PROCEDURE:  DATE OF DISCHARGE:                               OPERATIVE REPORT   PREOPERATIVE DIAGNOSIS:  Postmenopausal bleeding secondary to hormone  replacement therapy, probable.   POSTOPERATIVE DIAGNOSIS:  1. Postmenopausal bleeding secondary to hormone replacement therapy,      probable.  2. Endometrial polyps.   OPERATION:  Hysteroscopy and dilatation and curettage.   SURGEON:  Luvenia Redden, M.D.   PROCEDURE:  Under good anesthesia, the patient was in the lithotomy  position, prepped and draped in a sterile manner.  The bladder was  catheterized.  The exam revealed the uterus anterior, upper limits of  normal size.  No adnexal masses are palpable.  The cervix was grasped  with a tenaculum.  The uterine cavity sounded to a depth of 9 cm.  The  cervix was dilated.  Hysteroscope was placed in the endocervical canal.  Using sorbitol as a distending medium, the scope was advanced.  Multiple  polyps were seen within the uterine cavity, and the endometrium had an  irregular, shaggy appearance to it.  No other abnormalities were seen.  The scope was removed.  Endocervical curettage was done, and this was  sent as a separate specimen.  Next, the uterine cavity was explored with  a polyp forceps, and multiple polypoid fragments of tissue, which were  rather large, were removed.  Following this, a thorough curettage of the  endometrial cavity was performed.  An additional small amount of tissue  was obtained.  The endometrial cavity was then wiped with a dry sponge  and found to be clean.  The procedure was then terminated.  Blood loss  was minimal.  Fluid deficit was 20 cc.  Patient tolerated the procedure  well and was removed to  recovery in good condition.           ______________________________  Luvenia Redden, M.D.     WSB/MEDQ  D:  10/23/2006  T:  10/23/2006  Job:  [id number]

## 2010-05-25 NOTE — Op Note (Signed)
[facility]  Patient:    Crystal Carroll, Crystal Carroll Visit Number: [id number] MRN: [id number]          Service Type: SUR Location: [id number] Attending Physician:  Cristi Loron Dictated by:   Cristi Loron, M.D. Proc. Date: 02/16/01 Admit Date:  02/16/2001                             Operative Report  PREOPERATIVE DIAGNOSIS:  C5-6 and C6-7 degenerative disk disease, spondylosis, spinal stenosis, cervicalgia, and cervical radiculopathy.  POSTOPERATIVE DIAGNOSIS:  C5-6 and C6-7 degenerative disk disease, spondylosis, spinal stenosis, cervicalgia, and cervical radiculopathy.  PROCEDURE:  C5-6 and C6-7 extensive anterior cervical diskectomy, interbody iliac crest allograft arthrodesis, and anterior cervical plating (Codman titanium plate and screws).  SURGEON:  Cristi Loron, M.D.  ASSISTANT:  Stefani Dama, M.D.  ANESTHESIA:  General endotracheal.  ESTIMATED BLOOD LOSS:  150 cc.  SPECIMENS:  None.  DRAINS:  None.  COMPLICATIONS:  None.  BRIEF HISTORY:  The patient is a 65 year old white female who has suffered from neck and bilateral arm tingling and numbness.  She has failed medical management, and was worked up as an outpatient with a cervical MRI, which demonstrated severe degenerative disk disease and spondylosis at C5-6 and C6-7.  I discussed various treatment options with her and recommended that she consider surgery.  The patient weighed the risks, benefits, and alternatives of surgery, and decided to proceed with at the C5-6 and C6-7 anterior cervical diskectomy, fusion, and plating.  DESCRIPTION OF PROCEDURE:  The patient was brought to the operating room by the anesthesia team.  General endotracheal anesthesia was induced.  The patient remained in the supine position.  A roll was placed under her shoulders, placing her neck in slight extension.  The anterior cervical region was then prepared with Betadine scrub  with Betadine solution.  Sterile drapes were applied.  I then injected the area to be incised with Marcaine with epinephrine solution.  I used the scalpel to make a transverse incision in the patients left anterior neck.  I used the Metzenbaum scissors to divide the platysmal muscle.  I then dissected medial to the sternocleidomastoid muscle, jugular vein, and carotid artery.  I bluntly dissected down towards the anterior cervical spine, carefully identifying the esophagus, and retracting it medially.  I cleared the soft tissues from the anterior cervical spine, and then I inserted a bent spinal needle into the upper exposed interspace and obtained an intraoperative radiograph to confirm my location.  I then used electrocautery to detach the medial border of the longus colli muscle bilateral from the C5-6 and C6-7 intervertebral disk space.  I then inserted a Caspar self-retaining retractor for exposure.  I then brought the operative microscope into the field and under instant magnification and illumination, I completed the decompression.  I incised the C5-6 intervertebral disk and performed a partial diskectomy using pituitary forceps.  Of note, there is considerable ventral spondylosis.  Then, I inserted the _______ screws at C5 and C6, starting at the interspace.  I then used the high speed drill to drill the ventral bone spurs, and drilled away the remainder of the intervertebral disk at C5-6 which was quite collapsed down. I then used the high speed drill to decorticate the vertebral endplate of C6, and drilled the remainder of the intervertebral disk.  There was considerable posterior spondylosis which I thinned out with the  drill.  I then thinned out the posterior longitudinal ligament.  I incised the ligament with an arachnoid knife and then removed it with a Kerrison punch, undercutting the vertebral endplates at C5-6, decompressing the thecal sac, and performed a  foraminotomy about the bilateral C6 nerve Eliasen.  I then completed the procedure at C6-7, i.e. I removed the distraction screw from C5, placed into the C7, distracted the C6-7 intervertebral disk.  I performed a partial diskectomy by excising the intervertebral disk.  I performed the partial diskectomy with the pituitary forceps.  There also was considerable spondylosis at this level.  I then used the high speed drill to remove the ventral bone spurs, and then drilled away the remainder of the intervertebral disk, and decorticated the vertebral endplates at C6-7.  I again encountered considerable spondylosis posteriorly, which I thinned out with the drill, and I also thinned the posterior longitudinal ligament with the drill.  I incised the ligament with the arachnoid knife.  I then removed it with the Kerrison punch, undercutting the vertebral endplates at C6-7, decompressing the thecal sac.  I then performed a foraminotomy about the bilateral C7 nerve Sellick, completing the decompression.  I now turned my attention to arthrodesis.  I obtained iliac crest and pedicle allograft bone graft, and fashioned them to these approximate dimensions; approximately 7 mm in height and 1 cm in depth.  I inserted one bone graft into the C6-7 interspace.  I then removed the distraction screw from C7, and placed it back into C5, distracted it from the C5-6 interspace, and then placed a second bone graft in the C5-6 interspace.  I removed the distraction screws.  There was a good snug fit of bone graft at both levels.  I now turned my attention to anterior spinal instrumentation.  I obtained the appropriate length Codman anterior cervical plating.  Laid it along the anterior aspect of the vertebral body from C5 to C7.  I then drilled two holes at C5, two at C6, and two at C7; tapped the holes, and secured the plates at the vertebral bodies with two 12 mm screws at each vertebral body.  I then obtained an  intraoperative radiograph that demonstrated good position of the plate, screws, and interbody graft at C5 and at C6 (I could not see the screws  well at C7 secondary to the patients shoulders with ________).  I then secured the screws to the plate using the Cam tightener at each screw.  I then achieved strenuous hemostasis using bipolar electrocautery.  I copiously irrigated the wound out with Bacitracin solution, removed the solution, and then removed the Caspar self-retaining retractor.  I then inspected the esophagus for any damage and there was none apparent, and then reapproximated the patients platysmal muscle with interrupted 3-0 Vicryl suture, the subcutaneous tissue with interrupted 3-0 Vicryl suture, and the skin with Steri-Strips and Benzoin.  The wound was then coated with Bacitracin ointment. A sterile dressing was applied and the drapes were removed.  The patient was subsequently extubated by the anesthesia team and transported to the postanesthesia care unit in stable condition.  All sponge, instrument, and needle counts were correct at the end of the case. Dictated by:   Cristi Loron, M.D. Attending Physician:  Tressie Stalker D DD:  02/16/01 TD:  02/17/01 Job: [id number]

## 2010-06-21 ENCOUNTER — Other Ambulatory Visit: Payer: Self-pay | Admitting: Internal Medicine

## 2010-06-23 ENCOUNTER — Other Ambulatory Visit: Payer: Self-pay | Admitting: Internal Medicine

## 2010-09-11 ENCOUNTER — Telehealth: Payer: Self-pay | Admitting: Internal Medicine

## 2010-09-11 NOTE — Telephone Encounter (Signed)
I called pt and notified her that Dr Cato Mulligan recommended that she be sch for ov to see another doctor. I have sch pt to see Dr Clent Ridges at 2:15 on Wed, 09/12/10.

## 2010-09-11 NOTE — Telephone Encounter (Signed)
OV with any physician 

## 2010-09-11 NOTE — Telephone Encounter (Signed)
Pt called and said that she has runny nose and eyes, sorethroat, cough and chest congestion. Pt req med called in to [facility] or a work in ov with Dr Cato Mulligan only.

## 2010-09-12 ENCOUNTER — Ambulatory Visit (INDEPENDENT_AMBULATORY_CARE_PROVIDER_SITE_OTHER): Payer: Medicare Other | Admitting: Family Medicine

## 2010-09-12 ENCOUNTER — Encounter: Payer: Self-pay | Admitting: Family Medicine

## 2010-09-12 VITALS — BP 108/70 | HR 88 | Temp 98.1°F | Wt 148.0 lb

## 2010-09-12 DIAGNOSIS — J069 Acute upper respiratory infection, unspecified: Secondary | ICD-10-CM

## 2010-09-12 NOTE — Progress Notes (Signed)
  Subjective:    Patient ID: Crystal Carroll, female    DOB: August 14, 1945, 65 y.o.   MRN: [id number]  HPI Here for 3 days of stuffy head, PND, ST, sneezing, and a dry cough. No fever. She feels much better today.    Review of Systems  Constitutional: Negative.   HENT: Positive for congestion, rhinorrhea, sneezing and postnasal drip.   Eyes: Negative.   Respiratory: Positive for cough.        Objective:   Physical Exam  Constitutional: She appears well-developed and well-nourished.  HENT:  Right Ear: External ear normal.  Left Ear: External ear normal.  Nose: Nose normal.  Mouth/Throat: Oropharynx is clear and moist. No oropharyngeal exudate.  Eyes: Conjunctivae are normal. Pupils are equal, round, and reactive to light.  Neck: Neck supple. No thyromegaly present.  Pulmonary/Chest: Effort normal and breath sounds normal.  Lymphadenopathy:    She has no cervical adenopathy.          Assessment & Plan:  Rest, fluids, Mucinex prn

## 2010-09-27 ENCOUNTER — Other Ambulatory Visit: Payer: Self-pay | Admitting: Internal Medicine

## 2010-10-08 ENCOUNTER — Encounter: Payer: Self-pay | Admitting: Internal Medicine

## 2010-10-17 LAB — CBC
HCT: 46.5 — ABNORMAL HIGH
Hemoglobin: 16.3 — ABNORMAL HIGH
Platelets: 244
WBC: 7.8

## 2010-10-17 LAB — BASIC METABOLIC PANEL
Calcium: 10
GFR calc non Af Amer: >60
Potassium: 4.3
Sodium: 137

## 2010-11-09 ENCOUNTER — Other Ambulatory Visit (INDEPENDENT_AMBULATORY_CARE_PROVIDER_SITE_OTHER): Payer: Medicare Other

## 2010-11-09 DIAGNOSIS — I10 Essential (primary) hypertension: Secondary | ICD-10-CM

## 2010-11-09 DIAGNOSIS — Z79899 Other long term (current) drug therapy: Secondary | ICD-10-CM

## 2010-11-09 DIAGNOSIS — Z0000 Encounter for general adult medical examination without abnormal findings: Secondary | ICD-10-CM

## 2010-11-09 DIAGNOSIS — E785 Hyperlipidemia, unspecified: Secondary | ICD-10-CM

## 2010-11-09 LAB — HEPATIC FUNCTION PANEL
ALT: 22 U/L (ref 0–35)
Albumin: 4.4 g/dL (ref 3.5–5.2)
Bilirubin, Direct: 0 mg/dL (ref 0.0–0.3)
Total Protein: 7.5 g/dL (ref 6.0–8.3)

## 2010-11-09 LAB — BASIC METABOLIC PANEL
BUN: 14 mg/dL (ref 6–23)
CO2: 25 mEq/L (ref 19–32)
Chloride: 104 mEq/L (ref 96–112)
Creatinine, Ser: 1 mg/dL (ref 0.4–1.2)
Potassium: 5 mEq/L (ref 3.5–5.1)

## 2010-11-09 LAB — CBC WITH DIFFERENTIAL/PLATELET
Basophils Relative: 0.2 % (ref 0.0–3.0)
Eosinophils Absolute: 0.2 10*3/uL (ref 0.0–0.7)
Eosinophils Relative: 3 % (ref 0.0–5.0)
HCT: 40.5 % (ref 36.0–46.0)
Lymphs Abs: 1.1 10*3/uL (ref 0.7–4.0)
MCHC: 34.7 g/dL (ref 30.0–36.0)
MCV: 92.8 fl (ref 78.0–100.0)
Monocytes Absolute: 0.5 10*3/uL (ref 0.1–1.0)
Neutro Abs: 5 10*3/uL (ref 1.4–7.7)
Neutrophils Relative %: 73.3 % (ref 43.0–77.0)
RBC: 4.36 Mil/uL (ref 3.87–5.11)
WBC: 6.9 10*3/uL (ref 4.5–10.5)

## 2010-11-09 LAB — LIPID PANEL
Cholesterol: 225 mg/dL — ABNORMAL HIGH (ref 0–200)
HDL: 75 mg/dL (ref >39.00)
Triglycerides: 199 mg/dL — ABNORMAL HIGH (ref 0.0–149.0)

## 2010-11-09 LAB — POCT URINALYSIS DIPSTICK
Leukocytes, UA: NEGATIVE
Urobilinogen, UA: 0.2

## 2010-11-09 LAB — TSH: TSH: 1.43 u[IU]/mL (ref 0.35–5.50)

## 2010-11-21 ENCOUNTER — Other Ambulatory Visit: Payer: Self-pay | Admitting: Internal Medicine

## 2010-12-03 ENCOUNTER — Encounter: Payer: Self-pay | Admitting: Internal Medicine

## 2010-12-03 ENCOUNTER — Ambulatory Visit (INDEPENDENT_AMBULATORY_CARE_PROVIDER_SITE_OTHER): Payer: Medicare Other | Admitting: Internal Medicine

## 2010-12-03 VITALS — BP 112/64 | HR 84 | Temp 98.2°F | Ht 64.5 in | Wt 152.0 lb

## 2010-12-03 DIAGNOSIS — Z0000 Encounter for general adult medical examination without abnormal findings: Secondary | ICD-10-CM

## 2010-12-03 DIAGNOSIS — Z23 Encounter for immunization: Secondary | ICD-10-CM

## 2010-12-03 NOTE — Progress Notes (Signed)
[facility]  Past Medical History  Diagnosis Date  . Hyperlipidemia   . Hypertension   . GERD (gastroesophageal reflux disease)   . Anxiety     History   Social History  . Marital Status: Married    Spouse Name: N/A    Number of Children: N/A  . Years of Education: N/A   Occupational History  . Not on file.   Social History Main Topics  . Smoking status: Current Everyday Smoker -- 0.5 packs/day for 40 years    Types: Cigarettes  . Smokeless tobacco: Never Used  . Alcohol Use: Yes  . Drug Use: No  . Sexually Active: Not on file   Other Topics Concern  . Not on file   Social History Narrative  . No narrative on file    Past Surgical History  Procedure Date  . Cervical laminectomy 2004    Crystal Carroll    Family History  Problem Relation Age of Onset  . Heart attack Father   . Heart disease Other   . Melanoma Other   . Heart attack Other     Allergies  Allergen Reactions  . Sulfamethoxazole     REACTION: GI upset    Current Outpatient Prescriptions on File Prior to Visit  Medication Sig Dispense Refill  . acyclovir (ZOVIRAX) 5 % cream Apply 1 application topically 4 (four) times daily.        Marland Kitchen ALPRAZolam (XANAX) 0.25 MG tablet Take 0.25 mg by mouth at bedtime as needed.        Marland Kitchen aspirin 81 MG tablet Take 81 mg by mouth daily.        Marland Kitchen buPROPion (WELLBUTRIN XL) 300 MG 24 hr tablet Take 300 mg by mouth daily.        . CELEXA 10 MG tablet TAKE 1 TABLET ONCE DAILY.  90 each  1  . CRESTOR 40 MG tablet TAKE 1 TABLET ONCE DAILY.  30 each  5  . lisinopril (PRINIVIL,ZESTRIL) 40 MG tablet Take 1 tablet (40 mg total) by mouth daily.  30 tablet  11  . pantoprazole (PROTONIX) 40 MG tablet Take 40 mg by mouth daily.        Marland Kitchen VALTREX 1 G tablet TAKE 1 TABLET 3 TIMES A DAY.  21 each  0     patient denies chest pain, shortness of breath, orthopnea. Denies lower extremity edema, abdominal pain, change in appetite, change in bowel movements. Patient denies rashes,  musculoskeletal complaints. No other specific complaints in a complete review of systems.   BP 112/64  Pulse 84  Temp(Src) 98.2 F (36.8 C) (Oral)  Ht 5' 4.5" (1.638 m)  Wt 152 lb (68.947 kg)  BMI 25.69 kg/m2  Well-developed well-nourished female in no acute distress. HEENT exam atraumatic, normocephalic, extraocular muscles are intact. Neck is supple. No jugular venous distention no thyromegaly. Chest clear to auscultation without increased work of breathing. Cardiac exam S1 and S2 are regular. Abdominal exam active bowel sounds, soft, nontender. Extremities no edema. Neurologic exam she is alert without any motor sensory deficits. Gait is normal.   Assessment and plan health maintenance up to date. Smoking cessation discussed.

## 2010-12-06 ENCOUNTER — Other Ambulatory Visit: Payer: Self-pay | Admitting: Gynecology

## 2010-12-18 ENCOUNTER — Other Ambulatory Visit: Payer: Self-pay | Admitting: Internal Medicine

## 2010-12-27 ENCOUNTER — Other Ambulatory Visit: Payer: Self-pay | Admitting: Internal Medicine

## 2011-01-28 ENCOUNTER — Other Ambulatory Visit: Payer: Self-pay | Admitting: Internal Medicine

## 2011-02-28 ENCOUNTER — Other Ambulatory Visit: Payer: Self-pay | Admitting: Internal Medicine

## 2011-04-02 ENCOUNTER — Other Ambulatory Visit: Payer: Self-pay | Admitting: *Deleted

## 2011-04-02 MED ORDER — CITALOPRAM HYDROBROMIDE 10 MG PO TABS: 10.0000 mg | ORAL_TABLET | Freq: Every day | ORAL | Status: DC

## 2011-05-14 ENCOUNTER — Ambulatory Visit (INDEPENDENT_AMBULATORY_CARE_PROVIDER_SITE_OTHER): Payer: Medicare Other | Admitting: Family

## 2011-05-14 ENCOUNTER — Encounter: Payer: Self-pay | Admitting: Family

## 2011-05-14 DIAGNOSIS — I959 Hypotension, unspecified: Secondary | ICD-10-CM

## 2011-05-14 NOTE — Progress Notes (Signed)
Subjective:    Patient ID: Crystal Carroll, female    DOB: [birth year], 66 y.o.   MRN: [id number]  HPI 66 year old white female, half a pack per day smoker, patient of Dr. Cato Mulligan is in today with complaints of hypotension. She had her blood pressure checked at a health fair and was found to be 82/57. At that time, patient denied any lightheadedness or dizziness. She's here today concerned about a blood pressure. She currently takes lisinopril 40 milligrams once a day. Her blood pressure was taken with an electronic cuff.   Review of Systems  Constitutional: Negative.   HENT: Negative.   Respiratory: Negative.   Cardiovascular: Negative.   Gastrointestinal: Negative.   Musculoskeletal: Negative.   Skin: Negative.   Neurological: Negative.  Negative for dizziness and light-headedness.  Hematological: Negative.   Psychiatric/Behavioral: Negative.    Past Medical History  Diagnosis Date  . Hyperlipidemia   . Hypertension   . GERD (gastroesophageal reflux disease)   . Anxiety     History   Social History  . Marital Status: Married    Spouse Name: N/A    Number of Children: N/A  . Years of Education: N/A   Occupational History  . Not on file.   Social History Main Topics  . Smoking status: Current Everyday Smoker -- 0.5 packs/day for 40 years    Types: Cigarettes  . Smokeless tobacco: Never Used  . Alcohol Use: Yes  . Drug Use: No  . Sexually Active: Not on file   Other Topics Concern  . Not on file   Social History Narrative  . No narrative on file    Past Surgical History  Procedure Date  . Cervical laminectomy 2004    Delma Officer    Family History  Problem Relation Age of Onset  . Heart attack Father   . Heart disease Other   . Melanoma Other   . Heart attack Other   . Melanoma Mother     Allergies  Allergen Reactions  . Sulfamethoxazole     REACTION: GI upset    Current Outpatient Prescriptions on File Prior to Visit  Medication Sig Dispense  Refill  . acyclovir (ZOVIRAX) 5 % cream Apply 1 application topically 4 (four) times daily.        Marland Kitchen aspirin 81 MG tablet Take 81 mg by mouth daily.        . citalopram (CELEXA) 10 MG tablet Take 1 tablet (10 mg total) by mouth daily.  90 tablet  1  . CRESTOR 40 MG tablet TAKE 1 TABLET ONCE DAILY.  30 each  5  . lisinopril (PRINIVIL,ZESTRIL) 40 MG tablet TAKE 1 TABLET EACH DAY.  30 tablet  3  . pantoprazole (PROTONIX) 40 MG tablet Take 40 mg by mouth daily.        Marland Kitchen VALTREX 1 G tablet TAKE 1 TABLET 3 TIMES A DAY.  21 each  0  . WELLBUTRIN XL 300 MG 24 hr tablet TAKE 1 TABLET ONCE DAILY.  90 each  1  . XANAX 0.25 MG tablet TAKE (1) TABLET DAILY AS NEEDED.  30 each  3    BP 134/78  Pulse 72  Temp 98.2 F (36.8 C)  Resp 16  Ht 5' 4.5" (1.638 m)  Wt 152 lb (68.947 kg)  BMI 25.69 kg/m2chart    Objective:   Physical Exam  Constitutional: She is oriented to person, place, and time. She appears well-developed and well-nourished.  HENT:  Right Ear: External  ear normal.  Left Ear: External ear normal.  Nose: Nose normal.  Mouth/Throat: Oropharynx is clear and moist.  Neck: Normal range of motion. Neck supple.  Cardiovascular: Normal rate, regular rhythm and normal heart sounds.        Blood pressure recheck 136/80 in the left arm, 130/78 in the right arm  Pulmonary/Chest: Effort normal and breath sounds normal.  Abdominal: Soft. Bowel sounds are normal.  Neurological: She is alert and oriented to person, place, and time.  Skin: Skin is warm and dry.  Psychiatric: She has a normal mood and affect.          Assessment & Plan:  Assessment: Hypotension-resolved, hypertension  Plan: Pressure in the office today is stable and consistent with her usual readings. I don't see any need to adjust any blood pressure medications today. I've advised patient to keep a blood pressure diary periodically checking her blood pressure and call us with those blood pressure readings. Follow up with her  PCP as scheduled and sooner when necessary.

## 2011-07-01 ENCOUNTER — Other Ambulatory Visit: Payer: Self-pay | Admitting: Internal Medicine

## 2011-07-03 ENCOUNTER — Other Ambulatory Visit: Payer: Self-pay | Admitting: Internal Medicine

## 2011-07-26 ENCOUNTER — Telehealth: Payer: Self-pay | Admitting: Internal Medicine

## 2011-07-26 NOTE — Telephone Encounter (Signed)
Caller: [name]/Patient; PCP: Birdie Sons; CB#: [phone]; ; ; Call regarding Tick Bite; noted tick on R forearm just above the elbow 07/26/11.  Was able to remove entire tick.  Per protocol, emergnet symtpoms denied; advised for home care, with callback parameters given.

## 2011-08-05 ENCOUNTER — Other Ambulatory Visit: Payer: Self-pay | Admitting: Internal Medicine

## 2011-10-09 ENCOUNTER — Other Ambulatory Visit: Payer: Self-pay | Admitting: Internal Medicine

## 2011-11-11 ENCOUNTER — Other Ambulatory Visit: Payer: Self-pay | Admitting: *Deleted

## 2011-11-11 MED ORDER — PANTOPRAZOLE SODIUM 40 MG PO TBEC: 40.0000 mg | DELAYED_RELEASE_TABLET | Freq: Every day | ORAL | Status: DC

## 2011-11-11 MED ORDER — ROSUVASTATIN CALCIUM 40 MG PO TABS: 40.0000 mg | ORAL_TABLET | Freq: Every day | ORAL | Status: DC

## 2011-11-14 ENCOUNTER — Other Ambulatory Visit: Payer: Self-pay | Admitting: *Deleted

## 2011-11-14 MED ORDER — LISINOPRIL 40 MG PO TABS: 40.0000 mg | ORAL_TABLET | Freq: Every day | ORAL | Status: DC

## 2011-11-27 ENCOUNTER — Other Ambulatory Visit (INDEPENDENT_AMBULATORY_CARE_PROVIDER_SITE_OTHER): Payer: Medicare Other

## 2011-11-27 DIAGNOSIS — E785 Hyperlipidemia, unspecified: Secondary | ICD-10-CM

## 2011-11-27 DIAGNOSIS — Z0000 Encounter for general adult medical examination without abnormal findings: Secondary | ICD-10-CM

## 2011-11-27 DIAGNOSIS — Z79899 Other long term (current) drug therapy: Secondary | ICD-10-CM

## 2011-11-27 LAB — CBC WITH DIFFERENTIAL/PLATELET
Basophils Absolute: 0 10*3/uL (ref 0.0–0.1)
HCT: 40.6 % (ref 36.0–46.0)
Lymphocytes Relative: 19.4 % (ref 12.0–46.0)
Lymphs Abs: 1.1 10*3/uL (ref 0.7–4.0)
Monocytes Relative: 7.1 % (ref 3.0–12.0)
Platelets: 203 10*3/uL (ref 150.0–400.0)
RDW: 13.4 % (ref 11.5–14.6)

## 2011-11-27 LAB — BASIC METABOLIC PANEL
Calcium: 10.6 mg/dL — ABNORMAL HIGH (ref 8.4–10.5)
Creatinine, Ser: 1 mg/dL (ref 0.4–1.2)
GFR: 60.86 mL/min (ref >60.00)
Glucose, Bld: 82 mg/dL (ref 70–99)
Sodium: 138 mEq/L (ref 135–145)

## 2011-11-27 LAB — LIPID PANEL
Cholesterol: 213 mg/dL — ABNORMAL HIGH (ref 0–200)
HDL: 66.9 mg/dL (ref >39.00)
Triglycerides: 183 mg/dL — ABNORMAL HIGH (ref 0.0–149.0)

## 2011-11-27 LAB — POCT URINALYSIS DIPSTICK
Bilirubin, UA: NEGATIVE
Glucose, UA: NEGATIVE
Leukocytes, UA: NEGATIVE
Nitrite, UA: NEGATIVE

## 2011-11-27 LAB — HEPATIC FUNCTION PANEL
AST: 20 U/L (ref 0–37)
Total Bilirubin: 0.5 mg/dL (ref 0.3–1.2)

## 2011-11-27 LAB — TSH: TSH: 1.12 u[IU]/mL (ref 0.35–5.50)

## 2011-11-29 ENCOUNTER — Encounter: Payer: Self-pay | Admitting: Internal Medicine

## 2011-12-09 ENCOUNTER — Ambulatory Visit (INDEPENDENT_AMBULATORY_CARE_PROVIDER_SITE_OTHER): Payer: Medicare Other | Admitting: Internal Medicine

## 2011-12-09 ENCOUNTER — Encounter: Payer: Self-pay | Admitting: Internal Medicine

## 2011-12-09 VITALS — BP 114/66 | HR 78 | Temp 97.5°F | Ht 64.75 in | Wt 155.0 lb

## 2011-12-09 DIAGNOSIS — Z23 Encounter for immunization: Secondary | ICD-10-CM

## 2011-12-09 DIAGNOSIS — Z0000 Encounter for general adult medical examination without abnormal findings: Secondary | ICD-10-CM

## 2011-12-09 NOTE — Progress Notes (Signed)
Patient ID: Crystal Carroll, female   DOB: Nov 06, 1945, 66 y.o.   MRN: [id number]  cpx-  Past Medical History  Diagnosis Date  . Hyperlipidemia   . Hypertension   . GERD (gastroesophageal reflux disease)   . Anxiety     History   Social History  . Marital Status: Married    Spouse Name: N/A    Number of Children: N/A  . Years of Education: N/A   Occupational History  . Not on file.   Social History Main Topics  . Smoking status: Current Every Day Smoker -- 0.5 packs/day for 40 years    Types: Cigarettes  . Smokeless tobacco: Never Used  . Alcohol Use: Yes  . Drug Use: No  . Sexually Active: Not on file   Other Topics Concern  . Not on file   Social History Narrative  . No narrative on file    Past Surgical History  Procedure Date  . Cervical laminectomy 2004    Delma Officer    Family History  Problem Relation Age of Onset  . Heart attack Father   . Heart disease Other   . Melanoma Other   . Heart attack Other   . Melanoma Mother     Allergies  Allergen Reactions  . Sulfamethoxazole     REACTION: GI upset    Current Outpatient Prescriptions on File Prior to Visit  Medication Sig Dispense Refill  . acyclovir (ZOVIRAX) 5 % cream Apply 1 application topically 4 (four) times daily.        Marland Kitchen aspirin 81 MG tablet Take 81 mg by mouth daily.        . CELEXA 10 MG tablet TAKE 1 TABLET ONCE DAILY.  90 tablet  1  . lisinopril (PRINIVIL,ZESTRIL) 40 MG tablet Take 1 tablet (40 mg total) by mouth daily.  30 tablet  0  . pantoprazole (PROTONIX) 40 MG tablet Take 1 tablet (40 mg total) by mouth daily.  30 tablet  0  . rosuvastatin (CRESTOR) 40 MG tablet Take 1 tablet (40 mg total) by mouth daily.  30 tablet  0  . WELLBUTRIN XL 300 MG 24 hr tablet TAKE 1 TABLET ONCE DAILY.  90 each  1  . XANAX 0.25 MG tablet TAKE (1) TABLET DAILY AS NEEDED.  30 each  0     patient denies chest pain, shortness of breath, orthopnea. Denies lower extremity edema, abdominal pain, change  in appetite, change in bowel movements. Patient denies rashes, musculoskeletal complaints. No other specific complaints in a complete review of systems.   BP 114/66  Pulse 78  Temp 97.5 F (36.4 C) (Oral)  Ht 5' 4.75" (1.645 m)  Wt 155 lb (70.308 kg)  BMI 25.99 kg/m2  Well-developed well-nourished female in no acute distress. HEENT exam atraumatic, normocephalic, extraocular muscles are intact. Neck is supple. No jugular venous distention no thyromegaly. Chest clear to auscultation without increased work of breathing. Cardiac exam S1 and S2 are regular. Abdominal exam active bowel sounds, soft, nontender. Extremities no edema. Neurologic exam she is alert without any motor sensory deficits. Gait is normal.  A/p-- well visti- health maint ut Discussed need for weight loss and exercise

## 2011-12-11 ENCOUNTER — Other Ambulatory Visit: Payer: Self-pay | Admitting: *Deleted

## 2011-12-11 DIAGNOSIS — E785 Hyperlipidemia, unspecified: Secondary | ICD-10-CM

## 2011-12-11 DIAGNOSIS — I10 Essential (primary) hypertension: Secondary | ICD-10-CM

## 2011-12-11 MED ORDER — LISINOPRIL 40 MG PO TABS: 40.0000 mg | ORAL_TABLET | Freq: Every day | ORAL | Status: DC

## 2011-12-11 MED ORDER — ROSUVASTATIN CALCIUM 40 MG PO TABS: 40.0000 mg | ORAL_TABLET | Freq: Every day | ORAL | Status: DC

## 2011-12-23 ENCOUNTER — Other Ambulatory Visit: Payer: Self-pay | Admitting: *Deleted

## 2011-12-23 MED ORDER — PANTOPRAZOLE SODIUM 40 MG PO TBEC: 40.0000 mg | DELAYED_RELEASE_TABLET | Freq: Every day | ORAL | Status: DC

## 2011-12-31 ENCOUNTER — Other Ambulatory Visit: Payer: Self-pay | Admitting: *Deleted

## 2011-12-31 MED ORDER — CITALOPRAM HYDROBROMIDE 10 MG PO TABS: 10.0000 mg | ORAL_TABLET | Freq: Every day | ORAL | Status: DC

## 2012-02-10 ENCOUNTER — Other Ambulatory Visit: Payer: Self-pay | Admitting: Internal Medicine

## 2012-02-18 ENCOUNTER — Encounter: Payer: Self-pay | Admitting: Gastroenterology

## 2012-02-25 ENCOUNTER — Encounter: Payer: Self-pay | Admitting: Internal Medicine

## 2012-02-25 ENCOUNTER — Encounter: Payer: Self-pay | Admitting: Family Medicine

## 2012-02-25 ENCOUNTER — Ambulatory Visit (INDEPENDENT_AMBULATORY_CARE_PROVIDER_SITE_OTHER): Payer: Medicare Other | Admitting: Family Medicine

## 2012-02-25 VITALS — BP 122/68 | HR 91 | Temp 98.4°F | Wt 159.0 lb

## 2012-02-25 DIAGNOSIS — M659 Synovitis and tenosynovitis, unspecified: Secondary | ICD-10-CM

## 2012-02-25 DIAGNOSIS — M65839 Other synovitis and tenosynovitis, unspecified forearm: Secondary | ICD-10-CM

## 2012-02-25 NOTE — Progress Notes (Signed)
Chief Complaint  Patient presents with  . left ring finger painful and pops    soreness     HPI:  Acute visit for finger pain: -started 2-3 weeks ago -L ring finger -feels like this finger and MCP joint area has sore and sensation of catching with extension of finger  ROS: See pertinent positives and negatives per HPI.  Past Medical History  Diagnosis Date  . Hyperlipidemia   . Hypertension   . GERD (gastroesophageal reflux disease)   . Anxiety     Family History  Problem Relation Age of Onset  . Heart attack Father   . Heart disease Other   . Melanoma Other   . Heart attack Other   . Melanoma Mother     History   Social History  . Marital Status: Married    Spouse Name: N/A    Number of Children: N/A  . Years of Education: N/A   Social History Main Topics  . Smoking status: Current Every Day Smoker -- 0.50 packs/day for 40 years    Types: Cigarettes  . Smokeless tobacco: Never Used  . Alcohol Use: Yes  . Drug Use: No  . Sexually Active: None   Other Topics Concern  . None   Social History Narrative  . None    Current outpatient prescriptions:acyclovir (ZOVIRAX) 5 % cream, Apply 1 application topically 4 (four) times daily.  , Disp: , Rfl: ;  aspirin 81 MG tablet, Take 81 mg by mouth daily.  , Disp: , Rfl: ;  buPROPion (WELLBUTRIN XL) 300 MG 24 hr tablet, TAKE 1 TABLET ONCE DAILY., Disp: 30 tablet, Rfl: 5;  citalopram (CELEXA) 10 MG tablet, Take 1 tablet (10 mg total) by mouth daily., Disp: 90 tablet, Rfl: 1 lisinopril (PRINIVIL,ZESTRIL) 40 MG tablet, Take 1 tablet (40 mg total) by mouth daily., Disp: 30 tablet, Rfl: 5;  pantoprazole (PROTONIX) 40 MG tablet, Take 1 tablet (40 mg total) by mouth daily., Disp: 30 tablet, Rfl: 5;  rosuvastatin (CRESTOR) 40 MG tablet, Take 1 tablet (40 mg total) by mouth daily., Disp: 30 tablet, Rfl: 5;  valACYclovir (VALTREX) 1000 MG tablet, , Disp: , Rfl:  XANAX 0.25 MG tablet, TAKE (1) TABLET DAILY AS NEEDED., Disp: 30 each,  Rfl: 0  EXAM:  Filed Vitals:   02/25/12 1500  BP: 122/68  Pulse: 91  Temp: 98.4 F (36.9 C)    Body mass index is 26.65 kg/(m^2).  GENERAL: vitals reviewed and listed above, alert, oriented, appears well hydrated and in no acute distress  HEENT: atraumatic, conjunttiva clear, no obvious abnormalities on inspection of external nose and ears  NECK: no obvious masses on inspection  MS: moves all extremities without noticeable abnormality TTP at R ring finger flexor tendon over metacarpal head and in region of CMP joint No swelling erythema or nodule appreciated Pain with ext of flexor tendon Pt reports catching and pain with repeated flexion and ext -no weakness of fingers, good cap refill  PSYCH: pleasant and cooperative, no obvious depression or anxiety  ASSESSMENT AND PLAN:  Discussed the following assessment and plan:  Flexor tenosynovitis of finger -suspect mild tenosynovitis, discussed implications and management options - new management favors conservative immobilization with buddy taping or finger splint and ice for 4-6 weeks followed by rehab with stretching over immediate inj - pt prefers this approach  -Patient advised to return or notify a doctor immediately if symptoms worsen or persist or new concerns arise.  Patient Instructions  immobilize finger as  instructed  -ice 15 minutes twice daily if needed for pain  -follow up in 4-6 weeks      Jenisis Harmsen R.

## 2012-02-25 NOTE — Patient Instructions (Addendum)
immobilize finger as instructed  -ice 15 minutes twice daily if needed for pain  -follow up in 4-6 weeks

## 2012-06-14 ENCOUNTER — Other Ambulatory Visit: Payer: Self-pay | Admitting: Internal Medicine

## 2012-06-16 ENCOUNTER — Other Ambulatory Visit: Payer: Self-pay | Admitting: Internal Medicine

## 2012-07-13 ENCOUNTER — Other Ambulatory Visit: Payer: Self-pay | Admitting: Internal Medicine

## 2012-08-11 ENCOUNTER — Other Ambulatory Visit: Payer: Self-pay | Admitting: Internal Medicine

## 2012-08-14 ENCOUNTER — Other Ambulatory Visit: Payer: Self-pay | Admitting: *Deleted

## 2012-08-14 MED ORDER — ALPRAZOLAM 0.25 MG PO TABS: ORAL_TABLET | ORAL | Status: DC

## 2012-10-15 ENCOUNTER — Ambulatory Visit (INDEPENDENT_AMBULATORY_CARE_PROVIDER_SITE_OTHER)
Admission: RE | Admit: 2012-10-15 | Discharge: 2012-10-15 | Disposition: A | Discharge status: Home / Self Care | Payer: Medicare Other | Source: Ambulatory Visit | Attending: Internal Medicine | Admitting: Internal Medicine

## 2012-10-15 ENCOUNTER — Other Ambulatory Visit: Payer: Self-pay | Admitting: *Deleted

## 2012-10-15 DIAGNOSIS — M7989 Other specified soft tissue disorders: Secondary | ICD-10-CM

## 2012-11-02 ENCOUNTER — Encounter: Payer: Self-pay | Admitting: Family

## 2012-11-02 ENCOUNTER — Ambulatory Visit (INDEPENDENT_AMBULATORY_CARE_PROVIDER_SITE_OTHER): Payer: Medicare Other | Admitting: Family

## 2012-11-02 VITALS — BP 122/68 | HR 81 | Wt 169.0 lb

## 2012-11-02 DIAGNOSIS — Z23 Encounter for immunization: Secondary | ICD-10-CM

## 2012-11-02 DIAGNOSIS — M542 Cervicalgia: Secondary | ICD-10-CM

## 2012-11-02 MED ORDER — METHYLPREDNISOLONE 4 MG PO KIT: PACK | ORAL | Status: AC

## 2012-11-02 NOTE — Patient Instructions (Signed)
Cervical Radiculopathy  Cervical radiculopathy happens when a nerve in the neck is pinched or bruised by a slipped (herniated) disk or by arthritic changes in the bones of the cervical spine. This can occur due to an injury or as part of the normal aging process. Pressure on the cervical nerves can cause pain or numbness that runs from your neck all the way down into your arm and fingers.  CAUSES   There are many possible causes, including:   Injury.   Muscle tightness in the neck from overuse.   Swollen, painful joints (arthritis).   Breakdown or degeneration in the bones and joints of the spine (spondylosis) due to aging.   Bone spurs that may develop near the cervical nerves.  SYMPTOMS   Symptoms include pain, weakness, or numbness in the affected arm and hand. Pain can be severe or irritating. Symptoms may be worse when extending or turning the neck.  DIAGNOSIS   Your caregiver will ask about your symptoms and do a physical exam. He or she may test your strength and reflexes. X-rays, CT scans, and MRI scans may be needed in cases of injury or if the symptoms do not go away after a period of time. Electromyography (EMG) or nerve conduction testing may be done to study how your nerves and muscles are working.  TREATMENT   Your caregiver may recommend certain exercises to help relieve your symptoms. Cervical radiculopathy can, and often does, get better with time and treatment. If your problems continue, treatment options may include:   Wearing a soft collar for short periods of time.   Physical therapy to strengthen the neck muscles.   Medicines, such as nonsteroidal anti-inflammatory drugs (NSAIDs), oral corticosteroids, or spinal injections.   Surgery. Different types of surgery may be done depending on the cause of your problems.  HOME CARE INSTRUCTIONS    Put ice on the affected area.   Put ice in a plastic bag.   Place a towel between your skin and the bag.    Leave the ice on for 15-20 minutes, 3-4 times a day or as directed by your caregiver.   If ice does not help, you can try using heat. Take a warm shower or bath, or use a hot water bottle as directed by your caregiver.   You may try a gentle neck and shoulder massage.   Use a flat pillow when you sleep.   Only take over-the-counter or prescription medicines for pain, discomfort, or fever as directed by your caregiver.   If physical therapy was prescribed, follow your caregiver's directions.   If a soft collar was prescribed, use it as directed.  SEEK IMMEDIATE MEDICAL CARE IF:    Your pain gets much worse and cannot be controlled with medicines.   You have weakness or numbness in your hand, arm, face, or leg.   You have a high fever or a stiff, rigid neck.   You lose bowel or bladder control (incontinence).   You have trouble with walking, balance, or speaking.  MAKE SURE YOU:    Understand these instructions.   Will watch your condition.   Will get help right away if you are not doing well or get worse.  Document Released: 09/18/2000 Document Revised: 03/18/2011 Document Reviewed: 08/07/2010  [employer] Patient Information 2014 [employer].

## 2012-11-03 ENCOUNTER — Encounter: Payer: Self-pay | Admitting: Family

## 2012-11-03 NOTE — Progress Notes (Signed)
Subjective:    Patient ID: Crystal Carroll, female    DOB: December 11, 1945, 67 y.o.   MRN: [id number]  HPI  67 year old white female, patient of Dr. Cato Mulligan is in today with persistent tension in her neck and shoulders off and on x5 years but worse over the last 2 days. She brings attention about a 4/10, worse with movement. Advil helps but does not completely get rid of the pain. She MRI several years ago that showed a bulging disc.  Review of Systems  Constitutional: Negative.   HENT: Negative.   Respiratory: Negative.   Cardiovascular: Negative.   Gastrointestinal: Negative.   Endocrine: Negative.   Genitourinary: Negative.   Musculoskeletal: Positive for neck pain and neck stiffness.  Skin: Negative.   Neurological: Negative.   Psychiatric/Behavioral: Negative.    Past Medical History  Diagnosis Date  . Hyperlipidemia   . Hypertension   . GERD (gastroesophageal reflux disease)   . Anxiety     History   Social History  . Marital Status: Married    Spouse Name: N/A    Number of Children: N/A  . Years of Education: N/A   Occupational History  . Not on file.   Social History Main Topics  . Smoking status: Current Every Day Smoker -- 0.50 packs/day for 40 years    Types: Cigarettes  . Smokeless tobacco: Never Used  . Alcohol Use: Yes  . Drug Use: No  . Sexual Activity: Not on file   Other Topics Concern  . Not on file   Social History Narrative  . No narrative on file    Past Surgical History  Procedure Laterality Date  . Cervical laminectomy  2004    [facility] Officer    Family History  Problem Relation Age of Onset  . Heart attack Father   . Heart disease Other   . Melanoma Other   . Heart attack Other   . Melanoma Mother     Allergies  Allergen Reactions  . Sulfamethoxazole     REACTION: GI upset    Current Outpatient Prescriptions on File Prior to Visit  Medication Sig Dispense Refill  . ALPRAZolam (XANAX) 0.25 MG tablet TAKE (1) TABLET DAILY AS  NEEDED.  30 tablet  1  . aspirin 81 MG tablet Take 81 mg by mouth daily.        Marland Kitchen buPROPion (WELLBUTRIN XL) 300 MG 24 hr tablet TAKE 1 TABLET ONCE DAILY.  30 tablet  2  . citalopram (CELEXA) 10 MG tablet TAKE 1 TABLET ONCE DAILY.  30 tablet  5  . CRESTOR 40 MG tablet TAKE 1 TABLET ONCE DAILY.  30 tablet  5  . lisinopril (PRINIVIL,ZESTRIL) 40 MG tablet TAKE 1 TABLET EACH DAY.  30 tablet  5  . pantoprazole (PROTONIX) 40 MG tablet TAKE 1 TABLET ONCE DAILY.  30 tablet  5  . valACYclovir (VALTREX) 1000 MG tablet TAKE 1 TABLET 3 TIMES A DAY.  21 tablet  0  . ZOVIRAX 5 % USE 4 TIMES DAILY AS DIRECTED.  5 g  3   No current facility-administered medications on file prior to visit.    BP 122/68  Pulse 81  Wt 169 lb (76.658 kg)  BMI 28.33 kg/m2chart    Objective:   Physical Exam  Constitutional: She is oriented to person, place, and time. She appears well-developed and well-nourished.  HENT:  Right Ear: External ear normal.  Left Ear: External ear normal.  Nose: Nose normal.  Mouth/Throat: Oropharynx is  clear and moist.  Neck: Normal range of motion. Neck supple.  Cardiovascular: Normal rate, regular rhythm and normal heart sounds.   Pulmonary/Chest: Effort normal and breath sounds normal.  Abdominal: Soft. Bowel sounds are normal.  Musculoskeletal: She exhibits tenderness. She exhibits no edema.  Tension and tightness to the posterior neck and shoulders.   Neurological: She is alert and oriented to person, place, and time.  Skin: Skin is warm and dry.  Psychiatric: She has a normal mood and affect.          Assessment & Plan:  Assessment: 1. Neck tension 2. Neck pain  Plan: Medrol Dosepak as directed. Consider massage therapy. Ice and heat to the affected area. On the office with any questions or concerns. Recheck as scheduled, and as needed.

## 2012-11-16 ENCOUNTER — Other Ambulatory Visit: Payer: Self-pay | Admitting: Internal Medicine

## 2012-11-20 ENCOUNTER — Encounter: Payer: Self-pay | Admitting: Gastroenterology

## 2012-12-18 ENCOUNTER — Other Ambulatory Visit: Payer: Self-pay | Admitting: Internal Medicine

## 2013-01-17 ENCOUNTER — Other Ambulatory Visit: Payer: Self-pay | Admitting: Internal Medicine

## 2013-02-09 ENCOUNTER — Ambulatory Visit (INDEPENDENT_AMBULATORY_CARE_PROVIDER_SITE_OTHER): Payer: Medicare HMO | Admitting: Internal Medicine

## 2013-02-09 ENCOUNTER — Encounter: Payer: Self-pay | Admitting: Internal Medicine

## 2013-02-09 VITALS — BP 114/72 | HR 72 | Temp 98.3°F | Ht 64.5 in | Wt 165.0 lb

## 2013-02-09 DIAGNOSIS — E785 Hyperlipidemia, unspecified: Secondary | ICD-10-CM

## 2013-02-09 DIAGNOSIS — I10 Essential (primary) hypertension: Secondary | ICD-10-CM

## 2013-02-09 DIAGNOSIS — Z0000 Encounter for general adult medical examination without abnormal findings: Secondary | ICD-10-CM

## 2013-02-09 DIAGNOSIS — F17200 Nicotine dependence, unspecified, uncomplicated: Secondary | ICD-10-CM

## 2013-02-09 LAB — HEPATIC FUNCTION PANEL
ALT: 18 U/L (ref 0–35)
AST: 21 U/L (ref 0–37)
Albumin: 4.1 g/dL (ref 3.5–5.2)
Alkaline Phosphatase: 69 U/L (ref 39–117)
BILIRUBIN DIRECT: 0 mg/dL (ref 0.0–0.3)
TOTAL PROTEIN: 7.3 g/dL (ref 6.0–8.3)
Total Bilirubin: 0.6 mg/dL (ref 0.3–1.2)

## 2013-02-09 LAB — BASIC METABOLIC PANEL
BUN: 14 mg/dL (ref 6–23)
CHLORIDE: 105 meq/L (ref 96–112)
CO2: 24 mEq/L (ref 19–32)
CREATININE: 1.1 mg/dL (ref 0.4–1.2)
Calcium: 10.3 mg/dL (ref 8.4–10.5)
GFR: 52.45 mL/min — ABNORMAL LOW (ref >60.00)
Glucose, Bld: 93 mg/dL (ref 70–99)
POTASSIUM: 4.7 meq/L (ref 3.5–5.1)
Sodium: 137 mEq/L (ref 135–145)

## 2013-02-09 LAB — LDL CHOLESTEROL, DIRECT: Direct LDL: 124.7 mg/dL

## 2013-02-09 LAB — LIPID PANEL
Cholesterol: 216 mg/dL — ABNORMAL HIGH (ref 0–200)
HDL: 60.2 mg/dL (ref >39.00)
TRIGLYCERIDES: 324 mg/dL — AB (ref 0.0–149.0)
Total CHOL/HDL Ratio: 4
VLDL: 64.8 mg/dL — ABNORMAL HIGH (ref 0.0–40.0)

## 2013-02-09 LAB — CBC WITH DIFFERENTIAL/PLATELET
Basophils Absolute: 0 10*3/uL (ref 0.0–0.1)
Basophils Relative: 0.3 % (ref 0.0–3.0)
EOS ABS: 0.1 10*3/uL (ref 0.0–0.7)
Eosinophils Relative: 2.2 % (ref 0.0–5.0)
HCT: 41.5 % (ref 36.0–46.0)
Hemoglobin: 13.8 g/dL (ref 12.0–15.0)
LYMPHS ABS: 1.3 10*3/uL (ref 0.7–4.0)
Lymphocytes Relative: 20.2 % (ref 12.0–46.0)
MCHC: 33.2 g/dL (ref 30.0–36.0)
MCV: 93.2 fl (ref 78.0–100.0)
MONO ABS: 0.4 10*3/uL (ref 0.1–1.0)
Monocytes Relative: 6.2 % (ref 3.0–12.0)
NEUTROS PCT: 71.1 % (ref 43.0–77.0)
Neutro Abs: 4.6 10*3/uL (ref 1.4–7.7)
Platelets: 203 10*3/uL (ref 150.0–400.0)
RBC: 4.45 Mil/uL (ref 3.87–5.11)
RDW: 13.5 % (ref 11.5–14.6)
WBC: 6.4 10*3/uL (ref 4.5–10.5)

## 2013-02-09 LAB — TSH: TSH: 1.46 u[IU]/mL (ref 0.35–5.50)

## 2013-02-09 LAB — POCT URINALYSIS DIPSTICK
Bilirubin, UA: NEGATIVE
GLUCOSE UA: NEGATIVE
KETONES UA: NEGATIVE
LEUKOCYTES UA: NEGATIVE
Nitrite, UA: NEGATIVE
PH UA: 7
Spec Grav, UA: 1.025
Urobilinogen, UA: 0.2

## 2013-02-09 MED ORDER — LISINOPRIL 40 MG PO TABS: ORAL_TABLET | ORAL | Status: DC

## 2013-02-09 MED ORDER — PANTOPRAZOLE SODIUM 40 MG PO TBEC: DELAYED_RELEASE_TABLET | ORAL | Status: DC

## 2013-02-09 MED ORDER — CITALOPRAM HYDROBROMIDE 10 MG PO TABS: ORAL_TABLET | ORAL | Status: DC

## 2013-02-09 MED ORDER — ROSUVASTATIN CALCIUM 40 MG PO TABS: ORAL_TABLET | ORAL | Status: DC

## 2013-02-09 MED ORDER — BUPROPION HCL ER (XL) 300 MG PO TB24: ORAL_TABLET | ORAL | Status: DC

## 2013-02-09 NOTE — Progress Notes (Signed)
cpx  Past Medical History  Diagnosis Date  . Hyperlipidemia   . Hypertension   . GERD (gastroesophageal reflux disease)   . Anxiety     History   Social History  . Marital Status: Married    Spouse Name: N/A    Number of Children: N/A  . Years of Education: N/A   Occupational History  . Not on file.   Social History Main Topics  . Smoking status: Current Every Day Smoker -- 0.50 packs/day for 40 years    Types: Cigarettes  . Smokeless tobacco: Never Used  . Alcohol Use: Yes  . Drug Use: No  . Sexual Activity: Not on file   Other Topics Concern  . Not on file   Social History Narrative  . No narrative on file    Past Surgical History  Procedure Laterality Date  . Cervical laminectomy  2004    E[facility] Gell    Family History  Problem Relation Age of Onset  . Heart attack Father   . Heart disease Other   . Melanoma Other   . Heart attack Other   . Melanoma Mother     Allergies  Allergen Reactions  . Sulfamethoxazole     REACTION: GI upset    Current Outpatient Prescriptions on File Prior to Visit  Medication Sig Dispense Refill  . ALPRAZolam (XANAX) 0.25 MG tablet TAKE (1) TABLET DAILY AS NEEDED.  30 tablet  1  . aspirin 81 MG tablet Take 81 mg by mouth daily.        Marland Kitchen buPROPion (WELLBUTRIN XL) 300 MG 24 hr tablet TAKE 1 TABLET ONCE DAILY.  30 tablet  0  . citalopram (CELEXA) 10 MG tablet TAKE 1 TABLET ONCE DAILY.  30 tablet  0  . CRESTOR 40 MG tablet TAKE 1 TABLET ONCE DAILY.  90 tablet  0  . lisinopril (PRINIVIL,ZESTRIL) 40 MG tablet TAKE 1 TABLET EACH DAY.  90 tablet  0  . pantoprazole (PROTONIX) 40 MG tablet TAKE 1 TABLET ONCE DAILY.  90 tablet  0  . valACYclovir (VALTREX) 1000 MG tablet TAKE 1 TABLET 3 TIMES A DAY.  21 tablet  0  . ZOVIRAX 5 % USE 4 TIMES DAILY AS DIRECTED.  5 g  3   No current facility-administered medications on file prior to visit.     patient denies chest pain, shortness of breath, orthopnea. Denies lower extremity edema,  abdominal pain, change in appetite, change in bowel movements. Patient denies rashes, musculoskeletal complaints. No other specific complaints in a complete review of systems.   BP 114/72  Pulse 72  Temp(Src) 98.3 F (36.8 C) (Oral)  Ht 5' 4.5" (1.638 m)  Wt 165 lb (74.844 kg)  BMI 27.90 kg/m2  Well-developed well-nourished female in no acute distress. HEENT exam atraumatic, normocephalic, extraocular muscles are intact. Neck is supple. No jugular venous distention no thyromegaly. Chest clear to auscultation without increased work of breathing. Cardiac exam S1 and S2 are regular. Abdominal exam active bowel sounds, soft, nontender. Extremities no edema. Neurologic exam she is alert without any motor sensory deficits. Gait is normal.  Well visit Health maint utd  Smoker- she understands need to quit Discussed need for daily walking

## 2013-02-09 NOTE — Progress Notes (Signed)
Pre visit review using our clinic review tool, if applicable. No additional management support is needed unless otherwise documented below in the visit note. 

## 2013-02-10 ENCOUNTER — Telehealth: Payer: Self-pay | Admitting: Internal Medicine

## 2013-02-10 NOTE — Telephone Encounter (Signed)
Relevant patient education assigned to patient using Emmi. ° °

## 2013-02-22 ENCOUNTER — Encounter: Payer: Self-pay | Admitting: Internal Medicine

## 2013-03-16 ENCOUNTER — Encounter (INDEPENDENT_AMBULATORY_CARE_PROVIDER_SITE_OTHER): Payer: Medicare HMO

## 2013-03-16 DIAGNOSIS — R0602 Shortness of breath: Secondary | ICD-10-CM

## 2013-03-16 DIAGNOSIS — F17200 Nicotine dependence, unspecified, uncomplicated: Secondary | ICD-10-CM

## 2013-03-16 LAB — PULMONARY FUNCTION TEST
DL/VA % pred: 72 %
DL/VA: 3.48 ml/min/mmHg/L
DLCO unc % pred: 58 %
DLCO unc: 14.22 ml/min/mmHg
FEF 25-75 Post: 1.75 L/sec
FEF 25-75 Pre: 1.8 L/sec
FEF2575-%Change-Post: -2 %
FEF2575-%Pred-Post: 88 %
FEF2575-%Pred-Pre: 91 %
FEV1-%Change-Post: -1 %
FEV1-%Pred-Post: 79 %
FEV1-%Pred-Pre: 80 %
FEV1-Post: 1.84 L
FEV1-Pre: 1.86 L
FEV1FVC-%Change-Post: 2 %
FEV1FVC-%Pred-Pre: 105 %
FEV6-%Change-Post: -5 %
FEV6-%Pred-Post: 74 %
FEV6-%Pred-Pre: 78 %
FEV6-Post: 2.18 L
FEV6-Pre: 2.3 L
FEV6FVC-%Change-Post: -1 %
FEV6FVC-%Pred-Post: 101 %
FEV6FVC-%Pred-Pre: 102 %
FVC-%Change-Post: -4 %
FVC-%Pred-Post: 73 %
FVC-%Pred-Pre: 76 %
FVC-Post: 2.23 L
FVC-Pre: 2.33 L
Post FEV1/FVC ratio: 82 %
Post FEV6/FVC ratio: 98 %
Pre FEV1/FVC ratio: 80 %
Pre FEV6/FVC Ratio: 99 %

## 2013-03-18 ENCOUNTER — Encounter: Payer: Self-pay | Admitting: Internal Medicine

## 2013-08-18 ENCOUNTER — Other Ambulatory Visit: Payer: Self-pay | Admitting: Internal Medicine

## 2013-08-31 ENCOUNTER — Other Ambulatory Visit: Payer: Self-pay | Admitting: Internal Medicine

## 2013-09-21 ENCOUNTER — Other Ambulatory Visit: Payer: Self-pay | Admitting: Internal Medicine

## 2013-10-11 ENCOUNTER — Telehealth: Payer: Self-pay | Admitting: Internal Medicine

## 2013-10-11 NOTE — Telephone Encounter (Signed)
[place].is requesting re-fill on lisinopril (PRINIVIL,ZESTRIL) 40 MG tablet

## 2013-10-12 MED ORDER — LISINOPRIL 40 MG PO TABS: ORAL_TABLET | ORAL | Status: DC

## 2013-10-12 NOTE — Telephone Encounter (Signed)
Medication sent in. 

## 2013-10-15 ENCOUNTER — Other Ambulatory Visit: Payer: Self-pay | Admitting: Family Medicine

## 2013-10-15 ENCOUNTER — Encounter: Payer: Self-pay | Admitting: Family Medicine

## 2013-10-15 ENCOUNTER — Ambulatory Visit (INDEPENDENT_AMBULATORY_CARE_PROVIDER_SITE_OTHER): Payer: Medicare HMO | Admitting: Family Medicine

## 2013-10-15 VITALS — BP 108/60 | HR 72 | Temp 98.6°F | Wt 160.0 lb

## 2013-10-15 DIAGNOSIS — R05 Cough: Secondary | ICD-10-CM

## 2013-10-15 DIAGNOSIS — Z23 Encounter for immunization: Secondary | ICD-10-CM

## 2013-10-15 DIAGNOSIS — N183 Chronic kidney disease, stage 3 unspecified: Secondary | ICD-10-CM | POA: Insufficient documentation

## 2013-10-15 DIAGNOSIS — Z720 Tobacco use: Secondary | ICD-10-CM

## 2013-10-15 DIAGNOSIS — Z8619 Personal history of other infectious and parasitic diseases: Secondary | ICD-10-CM | POA: Insufficient documentation

## 2013-10-15 DIAGNOSIS — E785 Hyperlipidemia, unspecified: Secondary | ICD-10-CM

## 2013-10-15 DIAGNOSIS — F17200 Nicotine dependence, unspecified, uncomplicated: Secondary | ICD-10-CM

## 2013-10-15 DIAGNOSIS — R059 Cough, unspecified: Secondary | ICD-10-CM

## 2013-10-15 DIAGNOSIS — I10 Essential (primary) hypertension: Secondary | ICD-10-CM

## 2013-10-15 DIAGNOSIS — R319 Hematuria, unspecified: Secondary | ICD-10-CM

## 2013-10-15 LAB — POCT URINALYSIS DIPSTICK
Bilirubin, UA: NEGATIVE
GLUCOSE UA: NEGATIVE
Ketones, UA: NEGATIVE
LEUKOCYTES UA: NEGATIVE
NITRITE UA: NEGATIVE
SPEC GRAV UA: 1.02
Urobilinogen, UA: 1
pH, UA: 6

## 2013-10-15 NOTE — Assessment & Plan Note (Signed)
Poor control with last LDL at 124. Advised start regular exercise and repeat direct ldl next visit. Continue crestor 40mg 

## 2013-10-15 NOTE — Assessment & Plan Note (Signed)
Recheck albumin/calcium today.

## 2013-10-15 NOTE — Progress Notes (Signed)
Crystal Reddish, MD Phone: [phone]  Subjective:  Patient presents today to establish care with me as their new primary care provider. Patient was formerly a patient of Dr. Leanne Chang. Chief complaint-noted.   Hyperlipidemia-poor control Last ldl >100  On statin: yes, crestor 40mg  Regular exercise: no, advised ROS- no chest pain or shortness of breath. No myalgias  Tobacco abuse history-currently former smoker Patient quit smoking in march but has > 30 pack year smoking history. Discussed CT scan vs. CXR and patient prefers to avoid higher doses of radiation.  ROS-intermittent cough  Hematuria In former smoker-1+ on dipstick previously. Recheck today and send for micro if elevated ROS-no gross hematuria, no changes in urination  CKD Stage III-stable on lisinopril Hypercalcemia history-none on recent testing but intermittently elevated ROS-no changes in urination pattern or in urine color, no confusion, no   The following were reviewed and entered/updated in epic: Past Medical History  Diagnosis Date  . Hyperlipidemia   . Hypertension   . GERD (gastroesophageal reflux disease)   . Anxiety    Patient Active Problem List   Diagnosis Date Noted  . TOBACCO USE 12/24/2006    Priority: High  . Hypercalcemia 01/28/2007    Priority: Medium  . Hyperlipidemia 05/26/2006    Priority: Medium  . Anxiety state 05/26/2006    Priority: Medium  . Essential hypertension 05/26/2006    Priority: Medium  . H/O cold sores 10/15/2013    Priority: Low  . GERD 05/26/2006    Priority: Low  . CKD (chronic kidney disease), stage III 10/15/2013   Past Surgical History  Procedure Laterality Date  . Cervical laminectomy  2004    Earle Gell    Family History  Problem Relation Age of Onset  . Heart attack Father 65    smoker, rheumatic fever as child  . Melanoma Mother     labia    Medications- reviewed and updated Current Outpatient Prescriptions  Medication Sig Dispense Refill    . aspirin 81 MG tablet Take 81 mg by mouth daily.        Marland Kitchen buPROPion (WELLBUTRIN XL) 300 MG 24 hr tablet TAKE 1 TABLET ONCE DAILY.  90 tablet  1  . citalopram (CELEXA) 10 MG tablet TAKE 1 TABLET ONCE DAILY.  90 tablet  1  . CRESTOR 40 MG tablet TAKE 1 TABLET ONCE DAILY.  90 tablet  1  . lisinopril (PRINIVIL,ZESTRIL) 40 MG tablet TAKE 1 TABLET EACH DAY.  90 tablet  1  . pantoprazole (PROTONIX) 40 MG tablet TAKE 1 TABLET ONCE DAILY.  90 tablet  0  . ALPRAZolam (XANAX) 0.25 MG tablet TAKE (1) TABLET DAILY AS NEEDED.  30 tablet  1   No current facility-administered medications for this visit.    Allergies-reviewed and updated Allergies  Allergen Reactions  . Sulfamethoxazole     REACTION: GI upset    History   Social History  . Marital Status: Married    Spouse Name: N/A    Number of Children: N/A  . Years of Education: N/A   Social History Main Topics  . Smoking status: Former Smoker -- 0.75 packs/day for 50 years    Types: Cigarettes    Quit date: 03/07/2013  . Smokeless tobacco: Never Used  . Alcohol Use: Yes  . Drug Use: No  . Sexual Activity: None   Other Topics Concern  . None   Social History Narrative   Married to [employer] (patient) in 1981. 2nd marriage-1 child with 1  adopted grandchild.       Retired as Statistician.       Hobbies: antiques, former Firefighter      Exercise: none currently.           ROS--See HPI   Objective: BP 108/60  Pulse 72  Temp(Src) 98.6 F (37 C)  Wt 160 lb (72.576 kg) Gen: NAD, resting comfortably in chair.  HEENT: Mucous membranes are moist. Oropharynx normal CV: RRR no murmurs rubs or gallops Lungs: CTAB. Slight coarseness throughout.  Abdomen: soft/nontender/nondistended/normal bowel sounds.  Ext: no edema, 2+ PT pulses Skin: warm, dry, no rash Neuro: grossly normal, moves all extremities, PERRLA  Assessment/Plan:  Hyperlipidemia Poor control with last LDL at 124. Advised start regular exercise  and repeat direct ldl next visit. Continue crestor 40mg   TOBACCO USE Praised on cessation. Patient would like to avoid radiation from CT. Will get CXR for intermittent cough and former smoker.   CKD (chronic kidney disease), stage III Hypercalcemia Hematuria Check kidney function today. Does have intermittent hypercalcemia seems to correct to normal. May need to consider PTH testing. Hematuria also noted in former smoker-will need micro if dipstick shows 1+ blood again.   Hypercalcemia Recheck albumin/calcium today.   Return precautions advised. 4-6 month follow up.   Orders Placed This Encounter  Procedures  . DG Chest 2 View    Standing Status: Future     Number of Occurrences:      Standing Expiration Date: 12/16/2014    Order Specific Question:  Reason for Exam (SYMPTOM  OR DIAGNOSIS REQUIRED)    Answer:  cough and former smoker, considering low dose CT scan in future    Order Specific Question:  Preferred imaging location?    Answer:  [facility]  . Comprehensive metabolic panel  . POCT urinalysis dipstick

## 2013-10-15 NOTE — Assessment & Plan Note (Signed)
Praised on cessation. Patient would like to avoid radiation from CT. Will get CXR for intermittent cough and former smoker.

## 2013-10-15 NOTE — Patient Instructions (Addendum)
Chest x-ray at [facility]. Could consider low dose CT. Congrats on quitting smoking.   Sorry to alarm you about chronic kidney disease stage III. You are on the right medicine for this and I doubt this will ever really affect you. Keep taking your lisinopril.   Check your urine as there was possible blood in there last time-will send follow up test if needed.   Blood test will check your calcium and your kidney function.

## 2013-10-15 NOTE — Assessment & Plan Note (Signed)
Check kidney function today. Does have intermittent hypercalcemia seems to correct to normal. May need to consider PTH testing. Hematuria also noted in former smoker-will need micro if dipstick shows 1+ blood again.

## 2013-10-16 LAB — COMPREHENSIVE METABOLIC PANEL
ALBUMIN: 4.5 g/dL (ref 3.5–5.2)
ALK PHOS: 60 U/L (ref 39–117)
ALT: 14 U/L (ref 0–35)
AST: 20 U/L (ref 0–37)
BILIRUBIN TOTAL: 0.5 mg/dL (ref 0.2–1.2)
BUN: 20 mg/dL (ref 6–23)
CO2: 25 mEq/L (ref 19–32)
Calcium: 10.5 mg/dL (ref 8.4–10.5)
Chloride: 103 mEq/L (ref 96–112)
Creat: 1.22 mg/dL — ABNORMAL HIGH (ref 0.50–1.10)
GLUCOSE: 93 mg/dL (ref 70–99)
POTASSIUM: 4.3 meq/L (ref 3.5–5.3)
SODIUM: 139 meq/L (ref 135–145)
TOTAL PROTEIN: 6.9 g/dL (ref 6.0–8.3)

## 2013-10-17 LAB — URINE CULTURE
Colony Count: NO GROWTH
Organism ID, Bacteria: NO GROWTH

## 2013-10-18 LAB — URINALYSIS, MICROSCOPIC ONLY
Bacteria, UA: NONE SEEN
CASTS: NONE SEEN
Crystals: NONE SEEN
Squamous Epithelial / LPF: NONE SEEN

## 2013-10-20 ENCOUNTER — Ambulatory Visit (INDEPENDENT_AMBULATORY_CARE_PROVIDER_SITE_OTHER)
Admission: RE | Admit: 2013-10-20 | Discharge: 2013-10-20 | Disposition: A | Discharge status: Home / Self Care | Payer: Medicare HMO | Source: Ambulatory Visit | Attending: Family Medicine | Admitting: Family Medicine

## 2013-10-20 DIAGNOSIS — R059 Cough, unspecified: Secondary | ICD-10-CM

## 2013-10-20 DIAGNOSIS — R05 Cough: Secondary | ICD-10-CM

## 2013-10-20 DIAGNOSIS — F17200 Nicotine dependence, unspecified, uncomplicated: Secondary | ICD-10-CM

## 2013-10-20 DIAGNOSIS — Z720 Tobacco use: Secondary | ICD-10-CM

## 2013-10-21 ENCOUNTER — Encounter: Payer: Self-pay | Admitting: Family Medicine

## 2013-11-18 ENCOUNTER — Other Ambulatory Visit: Payer: Self-pay | Admitting: Gynecology

## 2013-11-19 LAB — CYTOLOGY - PAP

## 2013-12-24 ENCOUNTER — Other Ambulatory Visit: Payer: Self-pay | Admitting: Internal Medicine

## 2014-01-05 ENCOUNTER — Other Ambulatory Visit: Payer: Self-pay | Admitting: Internal Medicine

## 2014-01-24 ENCOUNTER — Encounter: Payer: Self-pay | Admitting: Family Medicine

## 2014-01-27 ENCOUNTER — Encounter: Payer: Self-pay | Admitting: Family Medicine

## 2014-01-27 ENCOUNTER — Ambulatory Visit (INDEPENDENT_AMBULATORY_CARE_PROVIDER_SITE_OTHER): Payer: PPO | Admitting: Family Medicine

## 2014-01-27 VITALS — BP 100/52 | Temp 98.0°F | Wt 157.0 lb

## 2014-01-27 DIAGNOSIS — K529 Noninfective gastroenteritis and colitis, unspecified: Secondary | ICD-10-CM

## 2014-01-27 LAB — COMPREHENSIVE METABOLIC PANEL
ALK PHOS: 73 U/L (ref 39–117)
ALT: 14 U/L (ref 0–35)
AST: 19 U/L (ref 0–37)
Albumin: 4.4 g/dL (ref 3.5–5.2)
BUN: 17 mg/dL (ref 6–23)
CALCIUM: 10.1 mg/dL (ref 8.4–10.5)
CO2: 22 mEq/L (ref 19–32)
Chloride: 103 mEq/L (ref 96–112)
Creatinine, Ser: 1.69 mg/dL — ABNORMAL HIGH (ref 0.40–1.20)
GFR: 31.86 mL/min — AB (ref >60.00)
GLUCOSE: 101 mg/dL — AB (ref 70–99)
Potassium: 3.9 mEq/L (ref 3.5–5.1)
Sodium: 136 mEq/L (ref 135–145)
Total Bilirubin: 0.5 mg/dL (ref 0.2–1.2)
Total Protein: 6.9 g/dL (ref 6.0–8.3)

## 2014-01-27 LAB — TSH: TSH: 1.18 u[IU]/mL (ref 0.35–4.50)

## 2014-01-27 NOTE — Patient Instructions (Signed)
Check your stool for infection and also check some bloodwork given how long you have had the diarrhea.   Let's check back in 2-3 weeks from now. If issues persistent and we do not find an answer, may send you to stomach docs

## 2014-01-27 NOTE — Progress Notes (Signed)
  [name], MD Phone: [phone]  Subjective:   Crystal Carroll is a 69 y.o. year old very pleasant female patient who presents with the following:  Chronic Diarrhea Over the last 6 weeks perhaps 5 days a week once a day. Normal color, no blood, no pain. Nausea only twice and short lived. Primarily loose not very watery except perhaps at beginning. Takes a lot of advil.   No recent travel, food changes, new medications.   ROS- NO melena or brbpr. No fever/chills/vomiting. No fatigue, slight weight loss in last few months but no drastic loss-stable in 6 weeks at home. No dizziness.   Past Medical History- Patient Active Problem List   Diagnosis Date Noted  . TOBACCO USE 12/24/2006    Priority: High  . CKD (chronic kidney disease), stage III 10/15/2013    Priority: Medium  . Hypercalcemia 01/28/2007    Priority: Medium  . Hyperlipidemia 05/26/2006    Priority: Medium  . Anxiety state 05/26/2006    Priority: Medium  . Essential hypertension 05/26/2006    Priority: Medium  . H/O cold sores 10/15/2013    Priority: Low  . GERD 05/26/2006    Priority: Low   Medications- reviewed and updated Current Outpatient Prescriptions  Medication Sig Dispense Refill  . aspirin 81 MG tablet Take 81 mg by mouth daily.      Marland Kitchen buPROPion (WELLBUTRIN XL) 300 MG 24 hr tablet TAKE 1 TABLET ONCE DAILY. 90 tablet 1  . citalopram (CELEXA) 10 MG tablet TAKE 1 TABLET ONCE DAILY. 90 tablet 1  . CRESTOR 40 MG tablet TAKE 1 TABLET ONCE DAILY. 30 tablet 5  . lisinopril (PRINIVIL,ZESTRIL) 40 MG tablet TAKE 1 TABLET EACH DAY. 90 tablet 1  . pantoprazole (PROTONIX) 40 MG tablet TAKE 1 TABLET ONCE DAILY. 90 tablet 0  . ALPRAZolam (XANAX) 0.25 MG tablet TAKE (1) TABLET DAILY AS NEEDED. (Patient not taking: Reported on 01/27/2014) 30 tablet 1   Objective: BP 100/52 mmHg  Temp(Src) 98 F (36.7 C)  Wt 157 lb (71.215 kg) Gen: NAD, resting comfortably Mucous membranes are moist. CV: RRR no murmurs rubs  or gallops Lungs: CTAB no crackles, wheeze, rhonchi Abdomen: soft/nontender/nondistended/normal bowel sounds (slightly hyperactive). No rebound or guarding.  Ext: no edema Skin: warm, dry, no rash   Assessment/Plan:  Chronic Diarrhea Labs as below. Interesting pattern but duration is worrisome so will include ova and parasite. Highly doubt c. Diff. No other symptoms to suggest hyperthyroidism. CMET to evaluate electrolytes and renal function.   Suggested to patient that she consider probiotics such as [place] depending on results.   Return precautions advised.   Orders Placed This Encounter  Procedures  . Stool culture  . Ova and parasite examination  . C. difficile, PCR  . TSH    [place]  . Comprehensive metabolic panel    [place]

## 2014-02-05 LAB — C. DIFFICILE GDH AND TOXIN A/B
C. difficile GDH: NOT DETECTED
C. difficile Toxin A/B: NOT DETECTED

## 2014-02-08 LAB — STOOL CULTURE

## 2014-02-08 LAB — OVA AND PARASITE EXAMINATION: OP: NONE SEEN

## 2014-02-17 ENCOUNTER — Encounter: Payer: Self-pay | Admitting: Family Medicine

## 2014-02-17 ENCOUNTER — Ambulatory Visit (INDEPENDENT_AMBULATORY_CARE_PROVIDER_SITE_OTHER): Payer: PPO | Admitting: Family Medicine

## 2014-02-17 VITALS — BP 108/50 | Temp 98.3°F | Wt 157.0 lb

## 2014-02-17 DIAGNOSIS — N183 Chronic kidney disease, stage 3 unspecified: Secondary | ICD-10-CM

## 2014-02-17 DIAGNOSIS — D369 Benign neoplasm, unspecified site: Secondary | ICD-10-CM

## 2014-02-17 DIAGNOSIS — R197 Diarrhea, unspecified: Secondary | ICD-10-CM | POA: Diagnosis not present

## 2014-02-17 LAB — BASIC METABOLIC PANEL
BUN: 22 mg/dL (ref 6–23)
CO2: 26 meq/L (ref 19–32)
Calcium: 10.4 mg/dL (ref 8.4–10.5)
Chloride: 102 mEq/L (ref 96–112)
Creatinine, Ser: 1.44 mg/dL — ABNORMAL HIGH (ref 0.40–1.20)
GFR: 38.32 mL/min — AB (ref >60.00)
Glucose, Bld: 102 mg/dL — ABNORMAL HIGH (ref 70–99)
Potassium: 4 mEq/L (ref 3.5–5.1)
Sodium: 135 mEq/L (ref 135–145)

## 2014-02-17 NOTE — Progress Notes (Signed)
Crystal Reddish, MD Phone: [phone]  Subjective:   Crystal Carroll is a 69 y.o. year old very pleasant female patient who presents with the following:  Chronic Diarrhea Now for about 8 weeks. still slightly watery but becoming more and more formed. Monday 2 episodes, other days has only had 1 episode. No episode yet today. Slight decreased appetite but no weight loss.   We reviewed her last colonoscopy from 2009 as well as path and discovered adenomatous polyp. Patient believed she was instructed 10 year follow up.   We also discussed her CKD stage III which showed worsening on last labs with GFR down to near 30 from typical 50-60 range.   ROS- no fever/chills/nausea. No blood or mucus in stool. No oliguria or change in urination.   Past Medical History- Patient Active Problem List   Diagnosis Date Noted  . TOBACCO USE 12/24/2006    Priority: High  . CKD (chronic kidney disease), stage III 10/15/2013    Priority: Medium  . Hypercalcemia 01/28/2007    Priority: Medium  . Hyperlipidemia 05/26/2006    Priority: Medium  . Anxiety state 05/26/2006    Priority: Medium  . Essential hypertension 05/26/2006    Priority: Medium  . H/O cold sores 10/15/2013    Priority: Low  . GERD 05/26/2006    Priority: Low   Medications- reviewed and updated Current Outpatient Prescriptions  Medication Sig Dispense Refill  . aspirin 81 MG tablet Take 81 mg by mouth daily.      Marland Kitchen buPROPion (WELLBUTRIN XL) 300 MG 24 hr tablet TAKE 1 TABLET ONCE DAILY. 90 tablet 1  . citalopram (CELEXA) 10 MG tablet TAKE 1 TABLET ONCE DAILY. 90 tablet 1  . CRESTOR 40 MG tablet TAKE 1 TABLET ONCE DAILY. 30 tablet 5  . lisinopril (PRINIVIL,ZESTRIL) 40 MG tablet TAKE 1 TABLET EACH DAY. 90 tablet 1  . pantoprazole (PROTONIX) 40 MG tablet TAKE 1 TABLET ONCE DAILY. 90 tablet 0  . ALPRAZolam (XANAX) 0.25 MG tablet TAKE (1) TABLET DAILY AS NEEDED. (Patient not taking: Reported on 01/27/2014) 30 tablet 1   No current  facility-administered medications for this visit.    Objective: BP 108/50 mmHg  Temp(Src) 98.3 F (36.8 C)  Wt 157 lb (71.215 kg) Gen: NAD, resting comfortably CV: RRR no murmurs rubs or gallops Lungs: CTAB no crackles, wheeze, rhonchi Abdomen: soft/nontender/nondistended/normal bowel sounds. No rebound or guarding.  Ext: no edema Skin: warm, dry, no rash  Assessment/Plan:  Chronic Diarrhea Slow improvement now with some formation of stool but still much more loose than her baseline. C. Diff, stool cultures, ova and parasites were negative. Given negative workup, discussed could use imodium but patient declines as she would like to know cause  Given change in stool pattern and discovery of adenomatous polyp in 2009-7 years out with no follow up, we discussed GI referral. I will have patient continue her probiotic which may possibly be helping. My strong guess is she will need colonoscopy.   CKD (chronic kidney disease), stage III GFR down to near 30 at last check from typical 50-60 range. Today, gfr improved to near 40. Unclear cause-last visit had stopped nsaids which helped some. Encouraged patient to continue to not use. Asked for 1 month follow up for repeat. I am hopeful she continues to trend up as she improves hydration, hopefully moves away from diarrhea.      Return precautions advised.   Orders Placed This Encounter  Procedures  . Basic metabolic panel  [place]  . Ambulatory referral to Gastroenterology    Referral Priority:  Routine    Referral Type:  Consultation    Referral Reason:  Specialty Services Required    Requested Specialty:  Gastroenterology    Number of Visits Requested:  1    Results for orders placed or performed in visit on 02/17/14 (from the past 24 hour(s))  Basic metabolic panel     Status: Abnormal   Collection Time: 02/17/14  4:05 PM  Result Value Ref Range   Sodium 135 135 - 145 mEq/L   Potassium 4.0 3.5 - 5.1 mEq/L   Chloride 102 96  - 112 mEq/L   CO2 26 19 - 32 mEq/L   Glucose, Bld 102 (H) 70 - 99 mg/dL   BUN 22 6 - 23 mg/dL   Creatinine, Ser 1.44 (H) 0.40 - 1.20 mg/dL   Calcium 10.4 8.4 - 10.5 mg/dL   GFR 38.32 (L) >60.00 mL/min

## 2014-02-17 NOTE — Patient Instructions (Signed)
I placed a referral to the stomach doctors. I am not sure if they will send you directly for colonoscopy or have them discuss your symptoms first.   Continue probiotic. I am glad your symptoms are much improved. Please return if any worsening beyond perhaps twice a day slightly loose stools.

## 2014-02-17 NOTE — Assessment & Plan Note (Signed)
GFR down to near 30 at last check from typical 50-60 range. Today, gfr improved to near 40. Unclear cause-last visit had stopped nsaids which helped some. Encouraged patient to continue to not use. Asked for 1 month follow up for repeat. I am hopeful she continues to trend up as she improves hydration, hopefully moves away from diarrhea.

## 2014-02-20 ENCOUNTER — Other Ambulatory Visit: Payer: Self-pay | Admitting: Internal Medicine

## 2014-02-22 ENCOUNTER — Other Ambulatory Visit: Payer: Self-pay | Admitting: Internal Medicine

## 2014-02-24 ENCOUNTER — Encounter: Payer: Self-pay | Admitting: Gastroenterology

## 2014-03-23 LAB — HM MAMMOGRAPHY: HM Mammogram: NORMAL

## 2014-03-24 ENCOUNTER — Encounter: Payer: Self-pay | Admitting: Family Medicine

## 2014-03-27 ENCOUNTER — Other Ambulatory Visit: Payer: Self-pay | Admitting: Family Medicine

## 2014-03-31 ENCOUNTER — Encounter: Payer: Self-pay | Admitting: Family Medicine

## 2014-04-14 ENCOUNTER — Ambulatory Visit (INDEPENDENT_AMBULATORY_CARE_PROVIDER_SITE_OTHER): Payer: PPO | Admitting: Gastroenterology

## 2014-04-14 ENCOUNTER — Encounter: Payer: Self-pay | Admitting: Gastroenterology

## 2014-04-14 VITALS — BP 104/60 | HR 76 | Ht 63.5 in | Wt 157.1 lb

## 2014-04-14 DIAGNOSIS — Z860100 Personal history of colon polyps, unspecified: Secondary | ICD-10-CM | POA: Insufficient documentation

## 2014-04-14 DIAGNOSIS — R197 Diarrhea, unspecified: Secondary | ICD-10-CM | POA: Diagnosis not present

## 2014-04-14 DIAGNOSIS — Z789 Other specified health status: Secondary | ICD-10-CM | POA: Diagnosis not present

## 2014-04-14 DIAGNOSIS — F1090 Alcohol use, unspecified, uncomplicated: Secondary | ICD-10-CM | POA: Insufficient documentation

## 2014-04-14 MED ORDER — NA SULFATE-K SULFATE-MG SULF 17.5-3.13-1.6 GM/177ML PO SOLN: 1.0000 | Freq: Once | ORAL | Status: DC

## 2014-04-14 NOTE — Assessment & Plan Note (Signed)
Plan followup colonoscopy 

## 2014-04-14 NOTE — Patient Instructions (Signed)

## 2014-04-14 NOTE — Assessment & Plan Note (Signed)
Stool studies were negative and symptoms appear to have resolved.  Plan no further workup at this time.

## 2014-04-14 NOTE — Progress Notes (Signed)
_                                                                                                                History of Present Illness:  Crystal Carroll is a pleasant 69 year old white female with history of colon polyps referred at the request of Dr. Yong Channel for evaluation of diarrhea.  For the past 3 months she is been complaining of loose stools consisting of 3-4 loose stools daily.  Stool studies were negative.  She denies change in medications or diet Over the past 2-3 weeks symptoms have actually significantly improved.  She's now having a normal solid bowel movement daily.  She does note small mass of blood on the toilet tissue with wiping.  Adenomatous polyps were removed in 2009.  Weight is stable.  She admits to drinking 3 glasses of wine daily although her companion says it is more.   Past Medical History  Diagnosis Date  . Hyperlipidemia   . Hypertension   . GERD (gastroesophageal reflux disease)   . Anxiety    Past Surgical History  Procedure Laterality Date  . Cervical laminectomy  2004    [facility]   family history includes Heart attack (age of onset: 30) in her father; Melanoma in her mother. Current Outpatient Prescriptions  Medication Sig Dispense Refill  . ALPRAZolam (XANAX) 0.25 MG tablet TAKE (1) TABLET DAILY AS NEEDED. (Patient taking differently: Take 0.125 mg by mouth as needed. TAKE (1) TABLET DAILY AS NEEDED.) 30 tablet 1  . aspirin 81 MG tablet Take 81 mg by mouth daily.      Marland Kitchen buPROPion (WELLBUTRIN XL) 300 MG 24 hr tablet TAKE 1 TABLET ONCE DAILY. 90 tablet 3  . citalopram (CELEXA) 10 MG tablet TAKE 1 TABLET ONCE DAILY. 90 tablet 3  . CRESTOR 40 MG tablet TAKE 1 TABLET ONCE DAILY. 30 tablet 5  . lisinopril (PRINIVIL,ZESTRIL) 40 MG tablet TAKE 1 TABLET EACH DAY. 90 tablet 1  . nystatin-triamcinolone (MYCOLOG II) cream Apply 1 application topically 3 (three) times daily as needed.    . pantoprazole (PROTONIX) 40 MG tablet TAKE 1 TABLET ONCE  DAILY. 90 tablet 1   No current facility-administered medications for this visit.   Allergies as of 04/14/2014 - Review Complete 04/14/2014  Allergen Reaction Noted  . Sulfamethoxazole  07/10/2006    reports that she quit smoking about 13 months ago. Her smoking use included Cigarettes. She has a 37.5 pack-year smoking history. She has never used smokeless tobacco. She reports that she drinks alcohol. She reports that she does not use illicit drugs.   Review of Systems: Pertinent positive and negative review of systems were noted in the above HPI section. All other review of systems were otherwise negative.  Vital signs were reviewed in today's medical record Physical Exam: General: Well developed , well nourished, no acute distress Skin: anicteric.  There are telangiectasias on her cheek Head: Normocephalic and atraumatic Eyes:  sclerae anicteric, EOMI Ears: Normal auditory acuity Mouth: No deformity or  lesions Neck: Supple, no masses or thyromegaly Lungs: Clear throughout to auscultation Heart: Regular rate and rhythm; no murmurs, rubs or bruits Abdomen: Soft, non tender and non distended. No masses, hepatosplenomegaly or hernias noted. Normal Bowel sounds Rectal:deferred Musculoskeletal: Symmetrical with no gross deformities  Skin: No lesions on visible extremities Pulses:  Normal pulses noted Extremities: No clubbing, cyanosis, edema or deformities noted Neurological: Alert oriented x 4, grossly nonfocal Cervical Nodes:  No significant cervical adenopathy Inguinal Nodes: No significant inguinal adenopathy Psychological:  Alert and cooperative. Normal mood and affect  See Assessment and Plan under Problem List

## 2014-04-14 NOTE — Assessment & Plan Note (Signed)
Patient admits to 3 glasses  of wine daily although I suspect more.

## 2014-04-15 ENCOUNTER — Ambulatory Visit (INDEPENDENT_AMBULATORY_CARE_PROVIDER_SITE_OTHER): Payer: PPO | Admitting: Family Medicine

## 2014-04-15 ENCOUNTER — Encounter: Payer: Self-pay | Admitting: Family Medicine

## 2014-04-15 VITALS — BP 112/64 | HR 70 | Temp 98.0°F | Wt 157.0 lb

## 2014-04-15 DIAGNOSIS — Z780 Asymptomatic menopausal state: Secondary | ICD-10-CM

## 2014-04-15 DIAGNOSIS — Z23 Encounter for immunization: Secondary | ICD-10-CM | POA: Diagnosis not present

## 2014-04-15 DIAGNOSIS — I10 Essential (primary) hypertension: Secondary | ICD-10-CM

## 2014-04-15 DIAGNOSIS — N183 Chronic kidney disease, stage 3 unspecified: Secondary | ICD-10-CM

## 2014-04-15 DIAGNOSIS — E785 Hyperlipidemia, unspecified: Secondary | ICD-10-CM

## 2014-04-15 LAB — COMPREHENSIVE METABOLIC PANEL
ALK PHOS: 62 U/L (ref 39–117)
ALT: 9 U/L (ref 0–35)
AST: 11 U/L (ref 0–37)
Albumin: 4.1 g/dL (ref 3.5–5.2)
BILIRUBIN TOTAL: 0.4 mg/dL (ref 0.2–1.2)
BUN: 26 mg/dL — AB (ref 6–23)
CO2: 23 mEq/L (ref 19–32)
Calcium: 9.6 mg/dL (ref 8.4–10.5)
Chloride: 106 mEq/L (ref 96–112)
Creatinine, Ser: 1.4 mg/dL — ABNORMAL HIGH (ref 0.40–1.20)
GFR: 39.57 mL/min — ABNORMAL LOW (ref >60.00)
GLUCOSE: 111 mg/dL — AB (ref 70–99)
POTASSIUM: 3.5 meq/L (ref 3.5–5.1)
Sodium: 137 mEq/L (ref 135–145)
Total Protein: 6.8 g/dL (ref 6.0–8.3)

## 2014-04-15 LAB — LDL CHOLESTEROL, DIRECT: LDL DIRECT: 100 mg/dL

## 2014-04-15 NOTE — Assessment & Plan Note (Signed)
In past. Not evident as of 04/2014 labs. Continue to monitor. Avoid hctz.

## 2014-04-15 NOTE — Progress Notes (Signed)
[name], MD Phone: [phone]  Subjective:   Crystal Carroll is a 69 y.o. year old very pleasant female patient who presents with the following:  Hyperlipidemia-mild poor control previously   On statin: crestor 44m Regular exercise: no advised Diet: poor choices ROS- no chest pain or shortness of breath. No myalgias  Hypertension-great control CKD Stage III- stable, on statin and ace-i though not clear on proteinuric element BP Readings from Last 3 Encounters:  04/15/14 112/64  04/14/14 104/60  02/17/14 108/50   Home BP monitoring-no Compliant with medications-yes without side effects ROS-Denies any CP, HA, SOB, blurry vision, LE edema  Past Medical History- Patient Active Problem List   Diagnosis Date Noted  . TOBACCO USE 12/24/2006    Priority: High  . CKD (chronic kidney disease), stage III 10/15/2013    Priority: Medium  . Hypercalcemia 01/28/2007    Priority: Medium  . Hyperlipidemia 05/26/2006    Priority: Medium  . Anxiety state 05/26/2006    Priority: Medium  . Essential hypertension 05/26/2006    Priority: Medium  . H/O cold sores 10/15/2013    Priority: Low  . GERD 05/26/2006    Priority: Low  . Diarrhea 04/14/2014  . History of colonic polyps 04/14/2014  . Heavy alcohol use 04/14/2014   Medications- reviewed and updated Current Outpatient Prescriptions  Medication Sig Dispense Refill  . ALPRAZolam (XANAX) 0.25 MG tablet TAKE (1) TABLET DAILY AS NEEDED. (Patient not taking: Reported on 04/15/2014) 30 tablet 1  . aspirin 81 MG tablet Take 81 mg by mouth daily.      .Marland KitchenbuPROPion (WELLBUTRIN XL) 300 MG 24 hr tablet TAKE 1 TABLET ONCE DAILY. 90 tablet 3  . citalopram (CELEXA) 10 MG tablet TAKE 1 TABLET ONCE DAILY. 90 tablet 3  . CRESTOR 40 MG tablet TAKE 1 TABLET ONCE DAILY. 30 tablet 5  . lisinopril (PRINIVIL,ZESTRIL) 40 MG tablet TAKE 1 TABLET EACH DAY. 90 tablet 1  . Na Sulfate-K Sulfate-Mg Sulf (SUPREP BOWEL PREP) SOLN Take 1 kit by mouth  once. 1 Bottle 0  . nystatin-triamcinolone (MYCOLOG II) cream Apply 1 application topically 3 (three) times daily as needed.    . pantoprazole (PROTONIX) 40 MG tablet TAKE 1 TABLET ONCE DAILY. 90 tablet 1   Objective: BP 112/64 mmHg  Pulse 70  Temp(Src) 98 F (36.7 C)  Wt 157 lb (71.215 kg) Gen: NAD, resting comfortably in chair CV: RRR no murmurs rubs or gallops Lungs: CTAB no crackles, wheeze, rhonchi Abdomen: soft/nontender/nondistended/normal bowel sounds. No rebound or guarding.  Ext: no edema Skin: warm, dry, no rash   Assessment/Plan:  Hyperlipidemia LDL right at 100 today. Encouraged exercise and eating healthy. Continue crestor 416m would not add another agent.    Hypercalcemia In past. Not evident as of 04/2014 labs. Continue to monitor. Avoid hctz.    Essential hypertension Control on lisinopril 40 mg   CKD (chronic kidney disease), stage III GFR 50-60-->30-40 after bout with diarrhea early 2016. Stable near 40 today. Continue BP and cholesterol control.     6 month follow up. Agrees to screen for osteoporosis.   Orders Placed This Encounter  Procedures  . DG Bone Density    Standing Status: Future     Number of Occurrences:      Standing Expiration Date: 06/15/2015    Order Specific Question:  Reason for Exam (SYMPTOM  OR DIAGNOSIS REQUIRED)    Answer:  screening osteoporosis, postmenopausal    Order Specific Question:  Preferred imaging location?  Answer:  [facility]  . Pneumococcal conjugate vaccine 13-valent  . LDL cholesterol, direct    [facility]  . Comprehensive metabolic panel    [place]

## 2014-04-15 NOTE — Assessment & Plan Note (Signed)
LDL right at 100 today. Encouraged exercise and eating healthy. Continue crestor 40mg , would not add another agent.

## 2014-04-15 NOTE — Patient Instructions (Addendum)
Received final pneumonia shot today([id number]).  Check bone density-schedule at front desk  Check cholesterol and kidneys  Check in at least every 6 months  Glad for good report from stomach docs

## 2014-04-15 NOTE — Assessment & Plan Note (Signed)
GFR 50-60-->30-40 after bout with diarrhea early 2016. Stable near 40 today. Continue BP and cholesterol control.

## 2014-04-15 NOTE — Assessment & Plan Note (Signed)
Control on lisinopril 40 mg

## 2014-04-18 ENCOUNTER — Encounter: Payer: Self-pay | Admitting: Gastroenterology

## 2014-04-23 ENCOUNTER — Other Ambulatory Visit: Payer: Self-pay | Admitting: Family Medicine

## 2014-05-16 ENCOUNTER — Ambulatory Visit (INDEPENDENT_AMBULATORY_CARE_PROVIDER_SITE_OTHER)
Admission: RE | Admit: 2014-05-16 | Discharge: 2014-05-16 | Disposition: A | Discharge status: Home / Self Care | Payer: PPO | Source: Ambulatory Visit | Attending: Family Medicine | Admitting: Family Medicine

## 2014-05-16 DIAGNOSIS — Z780 Asymptomatic menopausal state: Secondary | ICD-10-CM | POA: Diagnosis not present

## 2014-05-31 ENCOUNTER — Encounter: Payer: Self-pay | Admitting: Gastroenterology

## 2014-05-31 ENCOUNTER — Ambulatory Visit (AMBULATORY_SURGERY_CENTER): Payer: PPO | Admitting: Gastroenterology

## 2014-05-31 VITALS — BP 115/39 | HR 61 | Temp 97.9°F | Resp 23 | Ht 63.0 in | Wt 157.0 lb

## 2014-05-31 DIAGNOSIS — Z86010 Personal history of colonic polyps: Secondary | ICD-10-CM

## 2014-05-31 DIAGNOSIS — D123 Benign neoplasm of transverse colon: Secondary | ICD-10-CM

## 2014-05-31 DIAGNOSIS — R197 Diarrhea, unspecified: Secondary | ICD-10-CM

## 2014-05-31 DIAGNOSIS — K5730 Diverticulosis of large intestine without perforation or abscess without bleeding: Secondary | ICD-10-CM

## 2014-05-31 HISTORY — PX: COLONOSCOPY: SHX174

## 2014-05-31 MED ORDER — SODIUM CHLORIDE 0.9 % IV SOLN: 500.0000 mL | INTRAVENOUS | Status: DC

## 2014-05-31 NOTE — Op Note (Signed)
[facility]  [address] [place], [address]   COLONOSCOPY PROCEDURE REPORT  PATIENT: [name], [name]  MR#: [id number] BIRTHDATE: 01-07-1946 , 68  yrs. old GENDER: female ENDOSCOPIST: Inda Castle, MD REFERRED OX:[id number] Kristian Covey, M.D. PROCEDURE DATE:  05/31/2014 PROCEDURE:   Colonoscopy, surveillance and Colonoscopy with snare polypectomy First Screening Colonoscopy - Avg.  risk and is 50 yrs.  old or older - No.  Prior Negative Screening - Now for repeat screening. N/Carroll  History of Adenoma - Now for follow-up colonoscopy & has been > or = to 3 yrs.  Yes hx of adenoma.  Has been 3 or more years since last colonoscopy.  Polyps removed today? Yes ASA CLASS:   Class II INDICATIONS:PH Colon Adenoma 2009. MEDICATIONS: Monitored anesthesia care and Propofol 200 mg IV  DESCRIPTION OF PROCEDURE:   After the risks benefits and alternatives of the procedure were thoroughly explained, informed consent was obtained.  The digital rectal exam revealed no abnormalities of the rectum.   The LB [id number]  endoscope was introduced through the anus and advanced to the cecum, which was identified by both the appendix and ileocecal valve. No adverse events experienced.   The quality of the prep was (Suprep was used) excellent.  The instrument was then slowly withdrawn as the colon was fully examined. Estimated blood loss is zero unless otherwise noted in this procedure report.      COLON FINDINGS: Carroll sessile polyp measuring 3 mm in size was found in the proximal transverse colon.  Carroll polypectomy was performed with Carroll cold snare.  The resection was complete, the polyp tissue was completely retrieved and sent to histology.   There was severe diverticulosis noted in the sigmoid colon with associated colonic spasm and muscular hypertrophy.  Retroflexed views revealed no abnormalities. The time to cecum = 4.6 Withdrawal time = 7.9   The scope was withdrawn and the  procedure completed. COMPLICATIONS: There were no immediate complications.  ENDOSCOPIC IMPRESSION: 1.   Sessile polyp was found in the proximal transverse colon; polypectomy was performed with Carroll cold snare 2.   There was severe diverticulosis noted in the sigmoid colon  RECOMMENDATIONS: If the polyp(s) removed today are proven to be adenomatous (pre-cancerous) polyps, you will need Carroll repeat colonoscopy in 5 years.  Otherwise you should continue to follow colorectal cancer screening guidelines for "routine risk" patients with colonoscopy in 10 years.  You will receive Carroll letter within 1-2 weeks with the results of your biopsy as well as final recommendations.  Please call my office if you have not received Carroll letter after 3 weeks.  eSigned:  Inda Castle, MD 05/31/2014 3:17 PM   cc:   PATIENT NAME:  Crystal Carroll, Crystal Carroll MR#: [id number]

## 2014-05-31 NOTE — Patient Instructions (Signed)
YOU HAD AN ENDOSCOPIC PROCEDURE TODAY AT [facility]:   Refer to the procedure report that was given to you for any specific questions about what was found during the examination.  If the procedure report does not answer your questions, please call your gastroenterologist to clarify.  If you requested that your care partner not be given the details of your procedure findings, then the procedure report has been included in a sealed envelope for you to review at your convenience later.  YOU SHOULD EXPECT: Some feelings of bloating in the abdomen. Passage of more gas than usual.  Walking can help get rid of the air that was put into your GI tract during the procedure and reduce the bloating. If you had a lower endoscopy (such as a colonoscopy or flexible sigmoidoscopy) you may notice spotting of blood in your stool or on the toilet paper. If you underwent a bowel prep for your procedure, you may not have a normal bowel movement for a few days.  Please Note:  You might notice some irritation and congestion in your nose or some drainage.  This is from the oxygen used during your procedure.  There is no need for concern and it should clear up in a day or so.  SYMPTOMS TO REPORT IMMEDIATELY:   Following lower endoscopy (colonoscopy or flexible sigmoidoscopy):  Excessive amounts of blood in the stool  Significant tenderness or worsening of abdominal pains  Swelling of the abdomen that is new, acute  Fever of 100F or higher   For urgent or emergent issues, a gastroenterologist can be reached at any hour by calling [phone].   DIET: Your first meal following the procedure should be a small meal and then it is ok to progress to your normal diet. Heavy or fried foods are harder to digest and may make you feel nauseous or bloated.  Likewise, meals heavy in dairy and vegetables can increase bloating.  Drink plenty of fluids but you should avoid alcoholic beverages for 24 hours. Increase  the fiber in your diet.  ACTIVITY:  You should plan to take it easy for the rest of today and you should NOT DRIVE or use heavy machinery until tomorrow (because of the sedation medicines used during the test).    FOLLOW UP: Our staff will call the number listed on your records the next business day following your procedure to check on you and address any questions or concerns that you may have regarding the information given to you following your procedure. If we do not reach you, we will leave a message.  However, if you are feeling well and you are not experiencing any problems, there is no need to return our call.  We will assume that you have returned to your regular daily activities without incident.  If any biopsies were taken you will be contacted by phone or by letter within the next 1-3 weeks.  Please call us at [phone] if you have not heard about the biopsies in 3 weeks.    SIGNATURES/CONFIDENTIALITY: You and/or your care partner have signed paperwork which will be entered into your electronic medical record.  These signatures attest to the fact that that the information above on your After Visit Summary has been reviewed and is understood.  Full responsibility of the confidentiality of this discharge information lies with you and/or your care-partner.  Read all of the handouts given to you by your recovery room nurse.

## 2014-05-31 NOTE — Progress Notes (Signed)
To Pacu. Awake and alert. Pleased with MAC. Report to RN

## 2014-05-31 NOTE — Progress Notes (Signed)
Called to room to assist during endoscopic procedure.  Patient ID and intended procedure confirmed with present staff. Received instructions for my participation in the procedure from the performing physician.  

## 2014-06-01 ENCOUNTER — Telehealth: Payer: Self-pay

## 2014-06-01 NOTE — Telephone Encounter (Signed)
  Follow up Call-  Call back number 05/31/2014  Post procedure Call Back phone  # [phone]  Permission to leave phone message Yes     Patient questions:  Do you have a fever, pain , or abdominal swelling? No. Pain Score  0 *  Have you tolerated food without any problems? Yes.    Have you been able to return to your normal activities? Yes.    Do you have any questions about your discharge instructions: Diet   No. Medications  No. Follow up visit  No.  Do you have questions or concerns about your Care? No.  Actions: * If pain score is 4 or above: No action needed, pain <4.

## 2014-06-14 ENCOUNTER — Encounter: Payer: Self-pay | Admitting: Gastroenterology

## 2014-06-14 ENCOUNTER — Encounter: Payer: Self-pay | Admitting: Family Medicine

## 2014-06-14 DIAGNOSIS — D126 Benign neoplasm of colon, unspecified: Secondary | ICD-10-CM | POA: Insufficient documentation

## 2014-07-26 ENCOUNTER — Other Ambulatory Visit: Payer: Self-pay | Admitting: Family Medicine

## 2014-10-03 ENCOUNTER — Other Ambulatory Visit: Payer: Self-pay | Admitting: Family Medicine

## 2014-11-09 ENCOUNTER — Encounter: Payer: Self-pay | Admitting: Family Medicine

## 2014-11-23 ENCOUNTER — Encounter: Payer: Self-pay | Admitting: Gastroenterology

## 2014-11-28 ENCOUNTER — Telehealth: Payer: Self-pay

## 2014-11-28 NOTE — Telephone Encounter (Signed)
Please schedule pt follow up appointment with Dr. Yong Channel per request of her dentist.

## 2014-11-28 NOTE — Telephone Encounter (Signed)
Pt has been scheduled for 12/06/14 at 1 pm had to use a SDA

## 2014-12-06 ENCOUNTER — Ambulatory Visit: Payer: PPO | Admitting: Family Medicine

## 2014-12-06 ENCOUNTER — Ambulatory Visit (INDEPENDENT_AMBULATORY_CARE_PROVIDER_SITE_OTHER): Payer: PPO | Admitting: Family Medicine

## 2014-12-06 DIAGNOSIS — Z23 Encounter for immunization: Secondary | ICD-10-CM

## 2014-12-07 ENCOUNTER — Other Ambulatory Visit: Payer: Self-pay | Admitting: Family Medicine

## 2014-12-07 ENCOUNTER — Encounter: Payer: Self-pay | Admitting: Family Medicine

## 2014-12-07 ENCOUNTER — Ambulatory Visit (INDEPENDENT_AMBULATORY_CARE_PROVIDER_SITE_OTHER): Payer: PPO | Admitting: Family Medicine

## 2014-12-07 VITALS — BP 108/50 | Temp 98.7°F | Wt 163.0 lb

## 2014-12-07 DIAGNOSIS — Z20828 Contact with and (suspected) exposure to other viral communicable diseases: Secondary | ICD-10-CM | POA: Diagnosis not present

## 2014-12-07 DIAGNOSIS — I6521 Occlusion and stenosis of right carotid artery: Secondary | ICD-10-CM

## 2014-12-07 DIAGNOSIS — E785 Hyperlipidemia, unspecified: Secondary | ICD-10-CM

## 2014-12-07 DIAGNOSIS — I10 Essential (primary) hypertension: Secondary | ICD-10-CM

## 2014-12-07 DIAGNOSIS — N183 Chronic kidney disease, stage 3 unspecified: Secondary | ICD-10-CM

## 2014-12-07 LAB — LDL CHOLESTEROL, DIRECT: LDL DIRECT: 97 mg/dL

## 2014-12-07 LAB — BASIC METABOLIC PANEL
BUN: 20 mg/dL (ref 6–23)
CO2: 27 mEq/L (ref 19–32)
Calcium: 9.8 mg/dL (ref 8.4–10.5)
Chloride: 103 mEq/L (ref 96–112)
Creatinine, Ser: 1.61 mg/dL — ABNORMAL HIGH (ref 0.40–1.20)
GFR: 33.61 mL/min — AB (ref >60.00)
GLUCOSE: 84 mg/dL (ref 70–99)
POTASSIUM: 4.1 meq/L (ref 3.5–5.1)
SODIUM: 139 meq/L (ref 135–145)

## 2014-12-07 NOTE — Progress Notes (Signed)
[name], MD  Subjective:  Crystal Carroll is a 69 y.o. year old very pleasant female patient who presents for/with See problem oriented charting ROS- No chest pain or shortness of breath. No headache or blurry vision.   Past Medical History-  Patient Active Problem List   Diagnosis Date Noted  . TOBACCO USE 12/24/2006    Priority: High  . CKD (chronic kidney disease), stage III 10/15/2013    Priority: Medium  . Hypercalcemia 01/28/2007    Priority: Medium  . Hyperlipidemia 05/26/2006    Priority: Medium  . Anxiety state 05/26/2006    Priority: Medium  . Essential hypertension 05/26/2006    Priority: Medium  . Adenomatous colon polyp 06/14/2014    Priority: Low  . History of colonic polyps 04/14/2014    Priority: Low  . H/O cold sores 10/15/2013    Priority: Low  . GERD 05/26/2006    Priority: Low  . Diarrhea 04/14/2014  . Heavy alcohol use 04/14/2014    Medications- reviewed and updated Current Outpatient Prescriptions  Medication Sig Dispense Refill  . aspirin 81 MG tablet Take 81 mg by mouth daily.      Marland Kitchen buPROPion (WELLBUTRIN XL) 300 MG 24 hr tablet TAKE 1 TABLET ONCE DAILY. 90 tablet 3  . citalopram (CELEXA) 10 MG tablet TAKE 1 TABLET ONCE DAILY. 90 tablet 3  . CRESTOR 40 MG tablet TAKE 1 TABLET ONCE DAILY. 90 tablet 3  . lisinopril (PRINIVIL,ZESTRIL) 40 MG tablet TAKE 1 TABLET EACH DAY. 90 tablet 3  . pantoprazole (PROTONIX) 40 MG tablet TAKE 1 TABLET ONCE DAILY. 90 tablet 3  . ALPRAZolam (XANAX) 0.25 MG tablet TAKE (1) TABLET DAILY AS NEEDED. (Patient not taking: Reported on 04/15/2014) 30 tablet 1  . nystatin-triamcinolone (MYCOLOG II) cream Apply 1 application topically 3 (three) times daily as needed.     No current facility-administered medications for this visit.    Objective: BP 108/50 mmHg  Temp(Src) 98.7 F (37.1 C)  Wt 163 lb (73.936 kg) Gen: NAD, resting comfortably See below CV: RRR no murmurs rubs or gallops Lungs: CTAB no crackles,  wheeze, rhonchi Abdomen: soft/nontender/nondistended/normal bowel sounds. No rebound or guarding.  Ext: no edema Skin: warm, dry Neuro: grossly normal, moves all extremities  Assessment/Plan:  Hyperlipidemia S: improved controlled on crestor 40mg  with goal <100 LDL. No myalgias.  Lab Results  Component Value Date   LDLDIRECT 97.0 12/07/2014   A/P: continue current rx. Needs weight loss.     CKD (chronic kidney disease), stage III S:GFR 50-60 last year-->30-40 after bout with diarrhea early 2016. Same #s today.  A/P: discussed avoiding nsaids. Continue to trend. Wonder if diarrhea episode caused permanent damage.    Essential hypertension S: controlled. On Lisinopril 40mg   BP Readings from Last 3 Encounters:  12/07/14 108/50  05/31/14 115/39  04/15/14 112/64  A/P:Continue current meds:  Doing well  Calcifications left neck on dental x-ray S: saw dentist within last month and 2 small calcifications were seen in left neck. Per dentist could be Salivary or parotid stone or carotid calcification. Patient denies any pain O: no carotid bruit. No salivary or parotid stones palpated.  A/P: no obvious cause for concern after examination. Carotid artery calcification seems most likely with HLD, tobacco history, HTN. No bruit to suggest narrowing though certainly imperfect test. We will follow for symtpoms and repeat carotid bruit auscultation at follow up.   Return precautions advised.   Orders Placed This Encounter  Procedures  . Basic metabolic  panel    [facility]  . LDL cholesterol, direct    [facility]  . Hepatitis C antibody, reflex    [facility]

## 2014-12-07 NOTE — Assessment & Plan Note (Signed)
S:GFR 50-60 last year-->30-40 after bout with diarrhea early 2016. Same #s today.  A/P: discussed avoiding nsaids. Continue to trend. Wonder if diarrhea episode caused permanent damage.

## 2014-12-07 NOTE — Assessment & Plan Note (Signed)
S: improved controlled on crestor 40mg  with goal <100 LDL. No myalgias.  Lab Results  Component Value Date   LDLDIRECT 97.0 12/07/2014   A/P: continue current rx. Needs weight loss.

## 2014-12-07 NOTE — Assessment & Plan Note (Signed)
S: controlled. On Lisinopril 40mg   BP Readings from Last 3 Encounters:  12/07/14 108/50  05/31/14 115/39  04/15/14 112/64  A/P:Continue current meds:  Doing well

## 2014-12-07 NOTE — Patient Instructions (Addendum)
No findings to correspond to shadow on x-ray. No routine follow up unless symptoms. I will listen to your neck next visit  Labs before you go  Follow up 6 months  Health Maintenance Due  Topic Date Due  . Hepatitis C Screening  04/15/1945

## 2014-12-08 LAB — HEPATITIS C ANTIBODY: HCV AB: NEGATIVE

## 2015-01-19 DIAGNOSIS — Z01419 Encounter for gynecological examination (general) (routine) without abnormal findings: Secondary | ICD-10-CM | POA: Diagnosis not present

## 2015-01-20 ENCOUNTER — Encounter: Payer: Self-pay | Admitting: Family Medicine

## 2015-02-08 ENCOUNTER — Encounter: Payer: Self-pay | Admitting: Family Medicine

## 2015-02-08 ENCOUNTER — Other Ambulatory Visit: Payer: Self-pay | Admitting: Obstetrics & Gynecology

## 2015-02-08 DIAGNOSIS — D280 Benign neoplasm of vulva: Secondary | ICD-10-CM | POA: Diagnosis not present

## 2015-02-08 DIAGNOSIS — N909 Noninflammatory disorder of vulva and perineum, unspecified: Secondary | ICD-10-CM | POA: Diagnosis not present

## 2015-02-08 DIAGNOSIS — R809 Proteinuria, unspecified: Secondary | ICD-10-CM | POA: Diagnosis not present

## 2015-02-27 ENCOUNTER — Other Ambulatory Visit: Payer: Self-pay | Admitting: Family Medicine

## 2015-03-10 ENCOUNTER — Ambulatory Visit (INDEPENDENT_AMBULATORY_CARE_PROVIDER_SITE_OTHER): Payer: PPO | Admitting: Family Medicine

## 2015-03-10 ENCOUNTER — Encounter: Payer: Self-pay | Admitting: Family Medicine

## 2015-03-10 VITALS — BP 116/60 | HR 79 | Temp 98.0°F | Wt 163.0 lb

## 2015-03-10 DIAGNOSIS — N183 Chronic kidney disease, stage 3 unspecified: Secondary | ICD-10-CM

## 2015-03-10 DIAGNOSIS — I10 Essential (primary) hypertension: Secondary | ICD-10-CM

## 2015-03-10 LAB — POC URINALSYSI DIPSTICK (AUTOMATED)
BILIRUBIN UA: NEGATIVE
GLUCOSE UA: NEGATIVE
Ketones, UA: NEGATIVE
LEUKOCYTES UA: NEGATIVE
NITRITE UA: NEGATIVE
PH UA: 5.5
Spec Grav, UA: >=1.03
Urobilinogen, UA: 1

## 2015-03-10 LAB — CBC
HCT: 39.7 % (ref 36.0–46.0)
Hemoglobin: 13.4 g/dL (ref 12.0–15.0)
MCHC: 33.8 g/dL (ref 30.0–36.0)
MCV: 89.6 fl (ref 78.0–100.0)
PLATELETS: 220 10*3/uL (ref 150.0–400.0)
RBC: 4.43 Mil/uL (ref 3.87–5.11)
RDW: 13.5 % (ref 11.5–15.5)
WBC: 7.1 10*3/uL (ref 4.0–10.5)

## 2015-03-10 LAB — BASIC METABOLIC PANEL
BUN: 15 mg/dL (ref 6–23)
CALCIUM: 10.2 mg/dL (ref 8.4–10.5)
CO2: 26 mEq/L (ref 19–32)
CREATININE: 1.39 mg/dL — AB (ref 0.40–1.20)
Chloride: 105 mEq/L (ref 96–112)
GFR: 39.79 mL/min — AB (ref >60.00)
Glucose, Bld: 87 mg/dL (ref 70–99)
Potassium: 4 mEq/L (ref 3.5–5.1)
SODIUM: 141 meq/L (ref 135–145)

## 2015-03-10 LAB — MICROALBUMIN / CREATININE URINE RATIO
CREATININE, U: 103.6 mg/dL
Microalb Creat Ratio: 20.5 mg/g (ref 0.0–30.0)
Microalb, Ur: 21.2 mg/dL — ABNORMAL HIGH (ref 0.0–1.9)

## 2015-03-10 NOTE — Assessment & Plan Note (Signed)
S: controlled on Lisinopril 40mg  BP Readings from Last 3 Encounters:  03/10/15 116/60  12/07/14 108/50  05/31/14 115/39  A/P:Continue current medications- consider 20mg  lisinopril and monitor BP and GFR to see if any improvement though doubt- likely protective

## 2015-03-10 NOTE — Progress Notes (Signed)
[name], MD  Subjective:  Crystal Carroll is a 70 y.o. year old very pleasant female patient who presents for/with See problem oriented charting ROS- no chest pain, shortness of breath, no CVA tenderness, no change in urination pattern  Past Medical History-  Patient Active Problem List   Diagnosis Date Noted  . TOBACCO USE 12/24/2006    Priority: High  . CKD (chronic kidney disease), stage III 10/15/2013    Priority: Medium  . Hypercalcemia 01/28/2007    Priority: Medium  . Hyperlipidemia 05/26/2006    Priority: Medium  . Anxiety state 05/26/2006    Priority: Medium  . Essential hypertension 05/26/2006    Priority: Medium  . Adenomatous colon polyp 06/14/2014    Priority: Low  . History of colonic polyps 04/14/2014    Priority: Low  . H/O cold sores 10/15/2013    Priority: Low  . GERD 05/26/2006    Priority: Low  . Diarrhea 04/14/2014  . Heavy alcohol use 04/14/2014    Medications- reviewed and updated Current Outpatient Prescriptions  Medication Sig Dispense Refill  . aspirin 81 MG tablet Take 81 mg by mouth daily.      Marland Kitchen buPROPion (WELLBUTRIN XL) 300 MG 24 hr tablet TAKE 1 TABLET ONCE DAILY. 90 tablet 2  . citalopram (CELEXA) 10 MG tablet TAKE 1 TABLET ONCE DAILY. 90 tablet 2  . CRESTOR 40 MG tablet TAKE 1 TABLET ONCE DAILY. 90 tablet 3  . lisinopril (PRINIVIL,ZESTRIL) 40 MG tablet TAKE 1 TABLET EACH DAY. 90 tablet 3  . pantoprazole (PROTONIX) 40 MG tablet TAKE 1 TABLET ONCE DAILY. 90 tablet 3  . ALPRAZolam (XANAX) 0.25 MG tablet TAKE (1) TABLET DAILY AS NEEDED. (Patient not taking: Reported on 04/15/2014) 30 tablet 1  . nystatin-triamcinolone (MYCOLOG II) cream Apply 1 application topically 3 (three) times daily as needed. Reported on 03/10/2015     No current facility-administered medications for this visit.    Objective: BP 116/60 mmHg  Pulse 79  Temp(Src) 98 F (36.7 C)  Wt 163 lb (73.936 kg) Gen: NAD, resting comfortably CV: RRR no murmurs rubs or  gallops Lungs: CTAB no crackles, wheeze, rhonchi Abdomen: soft/nontender/nondistended/normal bowel sounds. No rebound or guarding.  Ext: no edema Skin: warm, dry Neuro: grossly normal, moves all extremities  Assessment/Plan:  CKD (chronic kidney disease), stage III S: Patient concerned about prior kidney findings with GFR dipping from 50-60 down to 30-40 range after diarrheal illness in 2016.  A/P: Given significant dip, will also get renal ultrasound, UA, microalbumin/creatinine ratio, cbc, bmet. Avoiding nsaids. Did advise to push fluids some. Consider lisinopril 20mg  to see if any difference if no proteinuria  Essential hypertension S: controlled on Lisinopril 40mg  BP Readings from Last 3 Encounters:  03/10/15 116/60  12/07/14 108/50  05/31/14 115/39  A/P:Continue current medications- consider 20mg  lisinopril and monitor BP and GFR to see if any improvement though doubt- likely protective   Return precautions advised.   Orders Placed This Encounter  Procedures  . US Renal    Wt Ins- Epic order    Standing Status: Future     Number of Occurrences:      Standing Expiration Date: 05/09/2016    Order Specific Question:  Reason for Exam (SYMPTOM  OR DIAGNOSIS REQUIRED)    Answer:  CKD III with worsening from GFR in 50s to 30s after chronic diarrhea and hasnt reovered    Order Specific Question:  Preferred imaging location?    Answer:  [facility]  .  Microalbumin / creatinine urine ratio    [place]  . Basic metabolic panel    [place]  . CBC    [place]  . POCT Urinalysis Dipstick (Automated)

## 2015-03-10 NOTE — Assessment & Plan Note (Addendum)
S: Patient concerned about prior kidney findings with GFR dipping from 50-60 down to 30-40 range after diarrheal illness in 2016.  A/P: Given significant dip, will also get renal ultrasound, UA, microalbumin/creatinine ratio, cbc, bmet. Avoiding nsaids. Did advise to push fluids some. Consider lisinopril 20mg  to see if any difference if no proteinuria

## 2015-03-10 NOTE — Patient Instructions (Signed)
Blood and urine tests before you go  We will call you within a week about your referral for renal ultrasound (kidney ultrasound). If you do not hear within 2 weeks, give [place] a call.   May reduce lisinopril to 20mg  and have you back in a month- will wait on labwork and ultrasound to decide

## 2015-03-11 ENCOUNTER — Encounter: Payer: Self-pay | Admitting: Family Medicine

## 2015-03-13 ENCOUNTER — Other Ambulatory Visit: Payer: Self-pay | Admitting: Family Medicine

## 2015-03-13 DIAGNOSIS — R319 Hematuria, unspecified: Secondary | ICD-10-CM

## 2015-03-13 NOTE — Telephone Encounter (Signed)
Keba please set patient up with urine microscopic

## 2015-03-15 ENCOUNTER — Ambulatory Visit
Admission: RE | Admit: 2015-03-15 | Discharge: 2015-03-15 | Disposition: A | Discharge status: Home / Self Care | Payer: PPO | Source: Ambulatory Visit | Attending: Family Medicine | Admitting: Family Medicine

## 2015-03-15 DIAGNOSIS — N183 Chronic kidney disease, stage 3 unspecified: Secondary | ICD-10-CM

## 2015-03-21 ENCOUNTER — Encounter: Payer: Self-pay | Admitting: Family Medicine

## 2015-04-07 ENCOUNTER — Other Ambulatory Visit: Payer: Self-pay | Admitting: Internal Medicine

## 2015-04-07 NOTE — Telephone Encounter (Signed)
Refill ok? 

## 2015-04-07 NOTE — Telephone Encounter (Signed)
Yes thanks 

## 2015-04-18 ENCOUNTER — Encounter: Payer: Self-pay | Admitting: Family Medicine

## 2015-04-18 ENCOUNTER — Ambulatory Visit (INDEPENDENT_AMBULATORY_CARE_PROVIDER_SITE_OTHER): Payer: PPO | Admitting: Family Medicine

## 2015-04-18 VITALS — BP 110/50 | HR 89 | Temp 99.2°F | Wt 162.0 lb

## 2015-04-18 DIAGNOSIS — G250 Essential tremor: Secondary | ICD-10-CM | POA: Diagnosis not present

## 2015-04-18 NOTE — Patient Instructions (Signed)
You opted to monitor for now. There are medications to help if it bothers you too much  Essential Tremor A tremor is trembling or shaking that you cannot control. Most tremors affect the hands or arms. Tremors can also affect the head, vocal cords, face, and other parts of the body.  Essential tremor is a tremor without a known cause.  CAUSES Essential tremor has no known cause.  RISK FACTORS You may be at greater risk of essential tremor if:   You have a family member with essential tremor.   You are age 16 or older.   You take certain medicines. SIGNS AND SYMPTOMS The main sign of a tremor is uncontrolled and unintentional rhythmic shaking of a body part.  You may have difficulty eating with a spoon or fork.   You may have difficulty writing.   You may nod your head up and down or side to side.   You may have a quivering voice.  Your tremors:  May get worse over time.   May come and go.   May be more noticeable on one side of your body.   May get worse due to stress, fatigue, caffeine, and extreme heat or cold.  DIAGNOSIS Your health care provider can diagnose essential tremor based on your symptoms, medical history, and a physical examination. There is no single test to diagnose an essential tremor. However, your health care provider may perform a variety of tests to rule out other conditions. Tests may include:   Blood and urine tests.   Imaging studies of your brain, such as:   CT scan.   MRI.   A test that measures involuntary muscle movement (electromyogram). TREATMENT Your tremors may go away without treatment. Mild tremors may not need treatment if they do not affect your day-to-day life. Severe tremors may need to be treated using one or a combination of the following options:   Medicines. This may include medicine that is injected.  Lifestyle changes.   Physical therapy.  HOME CARE INSTRUCTIONS  Take medicines only as directed by  your health care provider.   Limit alcohol intake to no more than 1 drink per day for nonpregnant women and 2 drinks per day for men. One drink equals 12 oz of beer, 5 oz of wine, or 1 oz of hard liquor.  Do not use any tobacco products, including cigarettes, chewing tobacco, or electronic cigarettes. If you need help quitting, ask your health care provider.  Take medicines only as directed by your health care provider.   Avoid extreme heat or cold.   Limit the amount of caffeine you consumeas directed by your health care provider.   Try to get eight hours of sleep each night.  Find ways to manage your stress, such as meditation or yoga.  Keep all follow-up visits as directed by your health care provider. This is important. This includes any physical therapy visits. SEEK MEDICAL CARE IF:  You experience any changes in the location or intensity of your tremors.   You start having a tremor after starting a new medicine.   You have tremor with other symptoms such as:   Numbness.   Tingling.   Pain.   Weakness.   Your tremor gets worse.   Your tremor interferes with your daily life.    This information is not intended to replace advice given to you by your health care provider. Make sure you discuss any questions you have with your health care provider.  Document Released: 01/14/2014 Document Reviewed: 01/14/2014 Elsevier Interactive Patient Education [employer].

## 2015-04-18 NOTE — Progress Notes (Signed)
Crystal Reddish, MD  Subjective:  Crystal Carroll is a 70 y.o. year old very pleasant female patient who presents for/with See problem oriented charting ROS- No facial or extremity weakness. No slurred words or trouble swallowing. no blurry vision or double vision. No paresthesias. No confusion or word finding difficulties.   Past Medical History-  Patient Active Problem List   Diagnosis Date Noted  . Essential tremor 04/18/2015    Priority: Medium  . CKD (chronic kidney disease), stage III 10/15/2013    Priority: Medium  . Hypercalcemia 01/28/2007    Priority: Medium  . TOBACCO USE 12/24/2006    Priority: Medium  . Hyperlipidemia 05/26/2006    Priority: Medium  . Anxiety state 05/26/2006    Priority: Medium  . Essential hypertension 05/26/2006    Priority: Medium  . Adenomatous colon polyp 06/14/2014    Priority: Low  . History of colonic polyps 04/14/2014    Priority: Low  . H/O cold sores 10/15/2013    Priority: Low  . GERD 05/26/2006    Priority: Low  . Diarrhea 04/14/2014  . Heavy alcohol use 04/14/2014    Medications- reviewed and updated Current Outpatient Prescriptions  Medication Sig Dispense Refill  . ALPRAZolam (XANAX) 0.25 MG tablet TAKE (1) TABLET DAILY AS NEEDED. 30 tablet 5  . aspirin 81 MG tablet Take 81 mg by mouth daily.      Marland Kitchen buPROPion (WELLBUTRIN XL) 300 MG 24 hr tablet TAKE 1 TABLET ONCE DAILY. 90 tablet 2  . citalopram (CELEXA) 10 MG tablet TAKE 1 TABLET ONCE DAILY. 90 tablet 2  . CRESTOR 40 MG tablet TAKE 1 TABLET ONCE DAILY. 90 tablet 3  . lisinopril (PRINIVIL,ZESTRIL) 40 MG tablet TAKE 1 TABLET EACH DAY. 90 tablet 3  . pantoprazole (PROTONIX) 40 MG tablet TAKE 1 TABLET ONCE DAILY. 90 tablet 3  . nystatin-triamcinolone (MYCOLOG II) cream Apply 1 application topically 3 (three) times daily as needed. Reported on 04/18/2015     No current facility-administered medications for this visit.    Objective: BP 110/50 mmHg  Pulse 89  Temp(Src) 99.2  F (37.3 C)  Wt 162 lb (73.483 kg) Gen: NAD, resting comfortably CV: RRR no murmurs rubs or gallops Lungs: CTAB no crackles, wheeze, rhonchi Abdomen: soft/nontender/nondistended/normal bowel sounds. No rebound or guarding.  Ext: no edema Skin: warm, dry Neuro: CN II-XII intact, sensation and reflexes normal throughout, 5/5 muscle strength in bilateral upper and lower extremities. Normal finger to nose. Normal rapid alternating movements. No pronator drift. Normal romberg. Normal gait.   Assessment/Plan:  Essential tremor S: plays bridge every Tuesday has been told hands shake when she plays the game for at least 6 months but admits could have been going on longer. Has seemed to get worse since last weekend. The more she dwells on it the worse it gets.  Usually no issues when well rested, gets worse with prolonged activity. Goes away with alcohol. No issues with rest. Better today. Has noted handwriting has been getting worse over last year. Trouble putting earrongs in. Left slightly worse than right A/P: Classic for essential tremor (intention tremor, no resting tremor, worse with anxiety or prolonged use, better with alcohol). Discussed medication options- primarily beta blocker with her underlying anxiety but she declines- would reduce lisinopril to 20mg  and consider beta blocker like propranolol. We will monitor for now per her preference. Counseled extensively on her stress levels and amount of worry.   Return precautions advised.   The duration of face-to-face time  during this visit was 20 minutes. Greater than 50% of this time was spent in counseling, explanation of diagnosis, planning of further management, and/or coordination of care.

## 2015-04-18 NOTE — Assessment & Plan Note (Signed)
S: plays bridge every Tuesday has been told hands shake when she plays the game for at least 6 months but admits could have been going on longer. Has seemed to get worse since last weekend. The more she dwells on it the worse it gets.  Usually no issues when well rested, gets worse with prolonged activity. Goes away with alcohol. No issues with rest. Better today. Has noted handwriting has been getting worse over last year. Trouble putting earrongs in. Left slightly worse than right A/P: Classic for essential tremor (intention tremor, no resting tremor, worse with anxiety or prolonged use, better with alcohol). Discussed medication options- primarily beta blocker with her underlying anxiety but she declines- would reduce lisinopril to 20mg  and consider beta blocker like propranolol. We will monitor for now per her preference. Counseled extensively on her stress levels and amount of worry.

## 2015-04-19 DIAGNOSIS — Z1231 Encounter for screening mammogram for malignant neoplasm of breast: Secondary | ICD-10-CM | POA: Diagnosis not present

## 2015-04-21 LAB — HM MAMMOGRAPHY: HM Mammogram: NORMAL (ref 0–4)

## 2015-04-25 ENCOUNTER — Encounter: Payer: Self-pay | Admitting: Family Medicine

## 2015-04-27 DIAGNOSIS — L853 Xerosis cutis: Secondary | ICD-10-CM | POA: Diagnosis not present

## 2015-04-27 DIAGNOSIS — L439 Lichen planus, unspecified: Secondary | ICD-10-CM | POA: Diagnosis not present

## 2015-04-27 DIAGNOSIS — L438 Other lichen planus: Secondary | ICD-10-CM | POA: Diagnosis not present

## 2015-04-27 DIAGNOSIS — L821 Other seborrheic keratosis: Secondary | ICD-10-CM | POA: Diagnosis not present

## 2015-04-27 DIAGNOSIS — R21 Rash and other nonspecific skin eruption: Secondary | ICD-10-CM | POA: Diagnosis not present

## 2015-05-25 ENCOUNTER — Other Ambulatory Visit: Payer: Self-pay | Admitting: Family Medicine

## 2015-06-07 DIAGNOSIS — L570 Actinic keratosis: Secondary | ICD-10-CM | POA: Diagnosis not present

## 2015-06-27 ENCOUNTER — Ambulatory Visit (INDEPENDENT_AMBULATORY_CARE_PROVIDER_SITE_OTHER): Payer: PPO | Admitting: Family Medicine

## 2015-06-27 ENCOUNTER — Encounter: Payer: Self-pay | Admitting: Family Medicine

## 2015-06-27 ENCOUNTER — Ambulatory Visit (INDEPENDENT_AMBULATORY_CARE_PROVIDER_SITE_OTHER)
Admission: RE | Admit: 2015-06-27 | Discharge: 2015-06-27 | Disposition: A | Discharge status: Home / Self Care | Payer: PPO | Source: Ambulatory Visit | Attending: Family Medicine | Admitting: Family Medicine

## 2015-06-27 VITALS — BP 136/78 | HR 81 | Temp 98.2°F | Ht 63.0 in | Wt 166.0 lb

## 2015-06-27 DIAGNOSIS — R0781 Pleurodynia: Secondary | ICD-10-CM | POA: Diagnosis not present

## 2015-06-27 DIAGNOSIS — S299XXA Unspecified injury of thorax, initial encounter: Secondary | ICD-10-CM | POA: Diagnosis not present

## 2015-06-27 MED ORDER — TRAMADOL HCL 50 MG PO TABS: 50.0000 mg | ORAL_TABLET | Freq: Four times a day (QID) | ORAL | Status: DC | PRN

## 2015-06-27 NOTE — Progress Notes (Signed)
Subjective:  Crystal Carroll is a 70 y.o. year old very pleasant female patient who presents for/with See problem oriented charting ROS- see any ROS included in HPI as well.   Past Medical History-  Patient Active Problem List   Diagnosis Date Noted  . Essential tremor 04/18/2015    Priority: Medium  . CKD (chronic kidney disease), stage III 10/15/2013    Priority: Medium  . Hypercalcemia 01/28/2007    Priority: Medium  . TOBACCO USE 12/24/2006    Priority: Medium  . Hyperlipidemia 05/26/2006    Priority: Medium  . Anxiety state 05/26/2006    Priority: Medium  . Essential hypertension 05/26/2006    Priority: Medium  . Adenomatous colon polyp 06/14/2014    Priority: Low  . History of colonic polyps 04/14/2014    Priority: Low  . H/O cold sores 10/15/2013    Priority: Low  . GERD 05/26/2006    Priority: Low  . Diarrhea 04/14/2014  . Heavy alcohol use 04/14/2014    Medications- reviewed and updated Current Outpatient Prescriptions  Medication Sig Dispense Refill  . ALPRAZolam (XANAX) 0.25 MG tablet TAKE (1) TABLET DAILY AS NEEDED. 30 tablet 5  . aspirin 81 MG tablet Take 81 mg by mouth daily.      Marland Kitchen buPROPion (WELLBUTRIN XL) 300 MG 24 hr tablet TAKE 1 TABLET ONCE DAILY. 90 tablet 2  . citalopram (CELEXA) 10 MG tablet TAKE 1 TABLET ONCE DAILY. 90 tablet 2  . CRESTOR 40 MG tablet TAKE 1 TABLET ONCE DAILY. 90 tablet 3  . lisinopril (PRINIVIL,ZESTRIL) 40 MG tablet TAKE 1 TABLET EACH DAY. 90 tablet 1  . nystatin-triamcinolone (MYCOLOG II) cream Apply 1 application topically 3 (three) times daily as needed. Reported on 04/18/2015    . pantoprazole (PROTONIX) 40 MG tablet TAKE 1 TABLET ONCE DAILY. 90 tablet 3   No current facility-administered medications for this visit.    Objective: BP 136/78 mmHg  Pulse 81  Temp(Src) 98.2 F (36.8 C) (Oral)  Ht 5\' 3"  (1.6 m)  Wt 166 lb (75.297 kg)  BMI 29.41 kg/m2  SpO2 96% Gen: NAD, resting comfortably CV: RRR no murmurs rubs or  gallops Lungs: CTAB no crackles, wheeze, rhonchi Patient very tender in an area at least 10 x 5 cm along her left lower ribs. No obvious bruising at this point. Diffusely tender in this area and cannot pinpoint more painful area. Abdomen: soft/nontender/nondistended/normal bowel sounds.  Ext: no edema Skin: warm, dry, no rash or bruising over the ribs yet Neuro: grossly normal, moves all extremities   Assessment/Plan:   Left Side Rib pain S: Out to dinner last night. Table was up on small platform. Gout up to see a friend and forgot was on a platform and fell toward a man in a chair and hit her left side of ribs along the back of chair with severe impact. Noted some pain initially moderately but then worsened. At 4:30 this AM seemed to hurt worse, very sore. Took 4 advil at 4:30, then 9 AM took 3 more advil, then took 1/2 an advil at 9:30 AM. Martin Majestic to play bridge and when she got back home started to have some spasms. Pain up to 10/10.  ROS-no fever, shortness of breath. Does have pain with deep breaths, coughing, sneezing. No abnormal fatigue A/P: We will get rib films although unlikely to change management. I suspect this is just going to be an extensive contusion but rib fracture is possible. Pain level is severe and  considered hydrocodone but patient apparently has not tolerated this or oxycodone in the past and feels confused. She also has some muscle spasm and may benefit from a medication such as Flexeril but has had similar confused response in the past. Therefore we opted to trial tramadol. We discussed increased risk of serotonin syndrome on citalopram. Also advised icing for first 3 days and then switching to heat.   Orders Placed This Encounter  Procedures  . DG Ribs Unilateral Left    Standing Status: Future     Number of Occurrences: 1     Standing Expiration Date: 08/26/2016    Order Specific Question:  Reason for Exam (SYMPTOM  OR DIAGNOSIS REQUIRED)    Answer:  left sided rib  pain, eval fracture    Order Specific Question:  Preferred imaging location?    Answer:  [facility]    Meds ordered this encounter  Medications  . traMADol (ULTRAM) 50 MG tablet    Sig: Take 1 tablet (50 mg total) by mouth every 6 (six) hours as needed.    Dispense:  40 tablet    Refill:  1   New Acute problem. Medication management including high-risk medication. High-risk in patient with history of difficulty tolerating medications including opiates and muscle relaxants  Return precautions advised. Specifically we discussed risk of pneumothorax if there was a fracture and urgent follow-up needed if develops shortness of breath [name], MD

## 2015-06-27 NOTE — Progress Notes (Signed)
Pre visit review using our clinic review tool, if applicable. No additional management support is needed unless otherwise documented below in the visit note. 

## 2015-06-27 NOTE — Patient Instructions (Signed)
Get rib films to evaluate for fracture  Take tramadol up to every 6 hours for pain. Can also schedule tylenol 650mg  every 6 hours. Would avoid ibuprofen with your kidney history  Ice area of pain 20 minutes 3x a day for next  Days then switch to heat

## 2015-06-29 ENCOUNTER — Ambulatory Visit (INDEPENDENT_AMBULATORY_CARE_PROVIDER_SITE_OTHER): Payer: PPO | Admitting: Family Medicine

## 2015-06-29 ENCOUNTER — Encounter: Payer: Self-pay | Admitting: Family Medicine

## 2015-06-29 VITALS — BP 136/72 | HR 80 | Temp 98.2°F

## 2015-06-29 DIAGNOSIS — S2232XA Fracture of one rib, left side, initial encounter for closed fracture: Secondary | ICD-10-CM

## 2015-06-29 MED ORDER — HYDROCODONE-ACETAMINOPHEN 5-325 MG PO TABS: 1.0000 | ORAL_TABLET | Freq: Four times a day (QID) | ORAL | Status: DC | PRN

## 2015-06-29 NOTE — Patient Instructions (Signed)
Trial stronger pain medicine. If you cannot get pain to manageable level (5-6/10 ) by tomorrow then proceed to emergency room. May need to get suggested CT at that point to make sure something else isnt going on

## 2015-06-29 NOTE — Progress Notes (Signed)
Subjective:  Crystal Carroll is a 70 y.o. year old very pleasant female patient who presents for/with See problem oriented charting ROS- denies shortness of breath, lightheadedness, disorientation on tramadol.see any ROS included in HPI as well.   Past Medical History-  Patient Active Problem List   Diagnosis Date Noted  . Essential tremor 04/18/2015    Priority: Medium  . CKD (chronic kidney disease), stage III 10/15/2013    Priority: Medium  . Hypercalcemia 01/28/2007    Priority: Medium  . TOBACCO USE 12/24/2006    Priority: Medium  . Hyperlipidemia 05/26/2006    Priority: Medium  . Anxiety state 05/26/2006    Priority: Medium  . Essential hypertension 05/26/2006    Priority: Medium  . Adenomatous colon polyp 06/14/2014    Priority: Low  . History of colonic polyps 04/14/2014    Priority: Low  . H/O cold sores 10/15/2013    Priority: Low  . GERD 05/26/2006    Priority: Low  . Diarrhea 04/14/2014  . Heavy alcohol use 04/14/2014    Medications- reviewed and updated Current Outpatient Prescriptions  Medication Sig Dispense Refill  . ALPRAZolam (XANAX) 0.25 MG tablet TAKE (1) TABLET DAILY AS NEEDED. 30 tablet 5  . aspirin 81 MG tablet Take 81 mg by mouth daily.      Marland Kitchen buPROPion (WELLBUTRIN XL) 300 MG 24 hr tablet TAKE 1 TABLET ONCE DAILY. 90 tablet 2  . citalopram (CELEXA) 10 MG tablet TAKE 1 TABLET ONCE DAILY. 90 tablet 2  . CRESTOR 40 MG tablet TAKE 1 TABLET ONCE DAILY. 90 tablet 3  . lisinopril (PRINIVIL,ZESTRIL) 40 MG tablet TAKE 1 TABLET EACH DAY. 90 tablet 1  . nystatin-triamcinolone (MYCOLOG II) cream Apply 1 application topically 3 (three) times daily as needed. Reported on 04/18/2015    . pantoprazole (PROTONIX) 40 MG tablet TAKE 1 TABLET ONCE DAILY. 90 tablet 3  . traMADol (ULTRAM) 50 MG tablet Take 1 tablet (50 mg total) by mouth every 6 (six) hours as needed. 40 tablet 1   No current facility-administered medications for this visit.   Dg Ribs Unilateral  Left  06/28/2015  CLINICAL DATA:  Status post fall lot left night with posterior lower ribcage pain on the left. Chest x-ray of October 20 2013 EXAM: LEFT RIBS - 2 VIEW COMPARISON:  Chest x-ray of October 20, 2013 FINDINGS: There is subtle contour deformity involving the lateral aspect of the left ninth rib. This is seen on two views. No displaced fracture is observed. Subtle loss of the cortical margin of the posterior aspect of the left tenth rib is noted. This is limited no by the patient's osteopenia. No pleural effusion or pneumothorax is demonstrated. IMPRESSION: No pneumothorax or pleural effusion.Probable nondisplaced fracture of the lateral aspect of the left ninth rib. Possible mildly displaced fracture of the posterior aspect of the left tenth rib fine detail of the posterior aspect of the tenth rib is not available. While this is likely due to osteopenia and radiographic technique a lytic lesion here is not excluded. Noncontrast chest CT scanning would be a useful next imaging step. Electronically Signed   By: David  Martinique M.D.   On: 06/28/2015 08:27     Objective: BP 136/72 mmHg  Pulse 80  Temp(Src) 98.2 F (36.8 C)  SpO2 96% Gen: NAD, resting comfortably, appears uncomfortable, unkempt for patient CV: RRR no murmurs rubs or gallops Lungs: CTAB no crackles, wheeze, rhonchi Patient very tender in an area at least 10 x 5 cm  along her left lower ribs. Diffusely tender in this area and cannot pinpoint more painful area. Patient seems to intermittently have flare ups of pain in area Abdomen: soft/nontender/nondistended/normal bowel sounds.  Ext: no edema Neuro: grossly normal, moves all extremities  Assessment/Plan:  Left rib fractures S:Patien treturns today after visit 2 days ago where we discovered likely 2 broken ribs. Her pain is severe. Has been able to sleep in night but today pain seems worse. Took tramadol 6 hours ago but has not repeated dosing. Pain 10/10 without shortness  of breath A/P: We will trade tramadol for vicodin given level of pain can take 1-2 tablets every 6 hours. Continue to avoid alcohol. She will follow up likely in emergency room if worsening pain as may need IV pain medicine- in addition follow up CT may be reasonable given lvel of pain but likelihood of malignancy/lytic lesion likely very low. On appearance today, would not tolerate CT scan without further pain management- we had planned on 8 week follow up x-ray initially and may still end up with that plan. patien thesitant to try hydrocodone due to SE on oxycodone of confusion in past. IN addition- does not want to trial muscle relaxant   Meds ordered this encounter  Medications  . HYDROcodone-acetaminophen (NORCO/VICODIN) 5-325 MG tablet    Sig: Take 1-2 tablets by mouth every 6 (six) hours as needed for severe pain.    Dispense:  50 tablet    Refill:  0   The duration of face-to-face time during this visit was 16 minutes. Greater than 50% of this time was spent in counseling, explanation of diagnosis, planning of further management, and/or coordination of care.    Return precautions advised.  [name], MD

## 2015-07-06 ENCOUNTER — Encounter: Payer: Self-pay | Admitting: Family Medicine

## 2015-07-07 ENCOUNTER — Telehealth: Payer: Self-pay | Admitting: Family Medicine

## 2015-07-07 ENCOUNTER — Other Ambulatory Visit: Payer: Self-pay | Admitting: Family Medicine

## 2015-07-07 NOTE — Telephone Encounter (Signed)
Yes thanks 

## 2015-07-07 NOTE — Telephone Encounter (Signed)
Pt needs new rx hydrocodone °

## 2015-07-07 NOTE — Telephone Encounter (Signed)
Yes may fill #[id number]- after hours so would be unable to fill at this point

## 2015-07-10 ENCOUNTER — Other Ambulatory Visit: Payer: Self-pay

## 2015-07-10 NOTE — Telephone Encounter (Signed)
Prescription was reviewed and changed. Patient called and voicemail left for patient to pick up prescription from up front

## 2015-07-13 ENCOUNTER — Other Ambulatory Visit: Payer: Self-pay

## 2015-07-13 NOTE — Telephone Encounter (Signed)
Patient picked up medication

## 2015-07-13 NOTE — Telephone Encounter (Signed)
Fax received from pharmacy stating that Hydrocodone was faxed into her pharmacy. Just want to verify that she did come pick this up since we cant fax that medication for her. Thanks.

## 2015-08-10 ENCOUNTER — Other Ambulatory Visit: Payer: Self-pay | Admitting: Family Medicine

## 2015-08-23 ENCOUNTER — Telehealth: Payer: Self-pay | Admitting: Family Medicine

## 2015-08-23 DIAGNOSIS — S2232XA Fracture of one rib, left side, initial encounter for closed fracture: Secondary | ICD-10-CM

## 2015-08-23 DIAGNOSIS — S2242XA Multiple fractures of ribs, left side, initial encounter for closed fracture: Secondary | ICD-10-CM

## 2015-08-23 NOTE — Telephone Encounter (Signed)
Patient needs follow up x-ray of ribs - can we please call her to have her go to [facility] for this? Thanks

## 2015-08-25 NOTE — Telephone Encounter (Signed)
As long as she gets the follow up- sounds good

## 2015-08-25 NOTE — Telephone Encounter (Signed)
Patient is on vacation right now, but will go get x-ray when they get back next week.

## 2015-09-05 ENCOUNTER — Ambulatory Visit (INDEPENDENT_AMBULATORY_CARE_PROVIDER_SITE_OTHER)
Admission: RE | Admit: 2015-09-05 | Discharge: 2015-09-05 | Disposition: A | Discharge status: Home / Self Care | Payer: PPO | Source: Ambulatory Visit | Attending: Family Medicine | Admitting: Family Medicine

## 2015-09-05 DIAGNOSIS — S2232XA Fracture of one rib, left side, initial encounter for closed fracture: Secondary | ICD-10-CM | POA: Diagnosis not present

## 2015-09-05 DIAGNOSIS — S2242XA Multiple fractures of ribs, left side, initial encounter for closed fracture: Secondary | ICD-10-CM | POA: Diagnosis not present

## 2015-10-18 ENCOUNTER — Encounter: Payer: Self-pay | Admitting: Family Medicine

## 2015-10-18 ENCOUNTER — Ambulatory Visit (INDEPENDENT_AMBULATORY_CARE_PROVIDER_SITE_OTHER): Payer: PPO | Admitting: Family Medicine

## 2015-10-18 ENCOUNTER — Other Ambulatory Visit: Payer: Self-pay | Admitting: Family Medicine

## 2015-10-18 VITALS — BP 108/60 | HR 83 | Temp 97.8°F | Wt 156.6 lb

## 2015-10-18 DIAGNOSIS — R319 Hematuria, unspecified: Secondary | ICD-10-CM | POA: Diagnosis not present

## 2015-10-18 DIAGNOSIS — N183 Chronic kidney disease, stage 3 unspecified: Secondary | ICD-10-CM

## 2015-10-18 DIAGNOSIS — I10 Essential (primary) hypertension: Secondary | ICD-10-CM

## 2015-10-18 DIAGNOSIS — Z23 Encounter for immunization: Secondary | ICD-10-CM

## 2015-10-18 DIAGNOSIS — E785 Hyperlipidemia, unspecified: Secondary | ICD-10-CM | POA: Diagnosis not present

## 2015-10-18 DIAGNOSIS — I129 Hypertensive chronic kidney disease with stage 1 through stage 4 chronic kidney disease, or unspecified chronic kidney disease: Secondary | ICD-10-CM | POA: Diagnosis not present

## 2015-10-18 DIAGNOSIS — F411 Generalized anxiety disorder: Secondary | ICD-10-CM | POA: Diagnosis not present

## 2015-10-18 LAB — LDL CHOLESTEROL, DIRECT: Direct LDL: 98 mg/dL

## 2015-10-18 LAB — BASIC METABOLIC PANEL
BUN: 15 mg/dL (ref 6–23)
CO2: 24 meq/L (ref 19–32)
Calcium: 10.2 mg/dL (ref 8.4–10.5)
Chloride: 106 mEq/L (ref 96–112)
Creatinine, Ser: 1.39 mg/dL — ABNORMAL HIGH (ref 0.40–1.20)
GFR: 39.72 mL/min — ABNORMAL LOW (ref >60.00)
GLUCOSE: 89 mg/dL (ref 70–99)
POTASSIUM: 4 meq/L (ref 3.5–5.1)
SODIUM: 141 meq/L (ref 135–145)

## 2015-10-18 LAB — POC URINALSYSI DIPSTICK (AUTOMATED)
BILIRUBIN UA: NEGATIVE
GLUCOSE UA: NEGATIVE
Ketones, UA: NEGATIVE
Leukocytes, UA: NEGATIVE
NITRITE UA: NEGATIVE
SPEC GRAV UA: 1.025
UROBILINOGEN UA: 0.2
pH, UA: 6

## 2015-10-18 MED ORDER — CITALOPRAM HYDROBROMIDE 20 MG PO TABS: 20.0000 mg | ORAL_TABLET | Freq: Every day | ORAL | 3 refills | Status: DC

## 2015-10-18 MED ORDER — LISINOPRIL 20 MG PO TABS: ORAL_TABLET | ORAL | 3 refills | Status: DC

## 2015-10-18 NOTE — Assessment & Plan Note (Addendum)
S: GFR has lingered near 40 down from 50-60 after bout with diarrhea in 2016- stable today A/P: continue to avoid nsaids- work on remaining hydrated. Reasonable to be on ace-I.  Possible blood in urine- get microscopic

## 2015-10-18 NOTE — Progress Notes (Signed)
Subjective:  Crystal Carroll is a 70 y.o. year old very pleasant female patient who presents for/with See problem oriented charting ROS- left knee pain, admits to anxiety but No chest pain or shortness of breath. No headache or blurry vision.No SI .see any ROS included in HPI as well.   Past Medical History-  Patient Active Problem List   Diagnosis Date Noted  . Essential tremor 04/18/2015    Priority: Medium  . CKD (chronic kidney disease), stage III 10/15/2013    Priority: Medium  . Hypercalcemia 01/28/2007    Priority: Medium  . TOBACCO USE 12/24/2006    Priority: Medium  . Hyperlipidemia 05/26/2006    Priority: Medium  . Anxiety state 05/26/2006    Priority: Medium  . Essential hypertension 05/26/2006    Priority: Medium  . Adenomatous colon polyp 06/14/2014    Priority: Low  . History of colonic polyps 04/14/2014    Priority: Low  . H/O cold sores 10/15/2013    Priority: Low  . GERD 05/26/2006    Priority: Low  . Diarrhea 04/14/2014  . Heavy alcohol use 04/14/2014    Medications- reviewed and updated Current Outpatient Prescriptions  Medication Sig Dispense Refill  . ALPRAZolam (XANAX) 0.25 MG tablet TAKE (1) TABLET DAILY AS NEEDED. 30 tablet 5  . aspirin 81 MG tablet Take 81 mg by mouth daily.      Marland Kitchen buPROPion (WELLBUTRIN XL) 300 MG 24 hr tablet TAKE 1 TABLET ONCE DAILY. 90 tablet 2  . citalopram (CELEXA) 20 MG tablet Take 1 tablet (20 mg total) by mouth daily. 90 tablet 3  . lisinopril (PRINIVIL,ZESTRIL) 20 MG tablet TAKE 1 TABLET EACH DAY. 90 tablet 3  . nystatin-triamcinolone (MYCOLOG II) cream Apply 1 application topically 3 (three) times daily as needed. Reported on 04/18/2015    . pantoprazole (PROTONIX) 40 MG tablet TAKE 1 TABLET ONCE DAILY. 90 tablet 3  . rosuvastatin (CRESTOR) 40 MG tablet TAKE 1 TABLET ONCE DAILY. 90 tablet 3   No current facility-administered medications for this visit.     Objective: BP 108/60   Pulse 83   Temp 97.8 F (36.6 C)  (Oral)   Wt 156 lb 9.6 oz (71 kg)   SpO2 94%   BMI 27.74 kg/m  Gen: NAD, resting comfortably CV: RRR no murmurs rubs or gallops Lungs: CTAB no crackles, wheeze, rhonchi Abdomen: overweight Ext: no edema, thin ankles compared to torso Skin: warm, dry, no rash  Assessment/Plan:  Essential hypertension S: controlled. On lisinopril 40mg  BP Readings from Last 3 Encounters:  10/18/15 108/60  06/29/15 136/72  06/27/15 136/78  A/P:Continue current meds:  First reading today in 90s, prior BPs higher while in pain from rib fracture- trial 20mg  and follow up 2 months  CKD (chronic kidney disease), stage III S: GFR has lingered near 40 down from 50-60 after bout with diarrhea in 2016- stable today A/P: continue to avoid nsaids- work on remaining hydrated. Reasonable to be on ace-I.  Possible blood in urine- get microscopic   Hyperlipidemia S: well controlled on crestor 40mg . No myalgias.  Lab Results  Component Value Date   CHOL 216 (H) 02/09/2013   HDL 60.20 02/09/2013   LDLCALC 92 11/02/2007   LDLDIRECT 98.0 10/18/2015   TRIG 324.0 (H) 02/09/2013   CHOLHDL 4 02/09/2013   A/P: continue current dose   Anxiety state S: high level stress with husband having parkinsons and some fear after fall leading to rib fractures A/P: we will titrate celexa to  20mg  and leave wellbutrin 300mg  with 2 month follow up. Return precautions advised   2 months  Orders Placed This Encounter  Procedures  . Flu vaccine HIGH DOSE PF  . Basic metabolic panel    [place]  . LDL cholesterol, direct    [place]  . POCT Urinalysis Dipstick (Automated)    Meds ordered this encounter  Medications  . lisinopril (PRINIVIL,ZESTRIL) 20 MG tablet    Sig: TAKE 1 TABLET EACH DAY.    Dispense:  90 tablet    Refill:  3  . citalopram (CELEXA) 20 MG tablet    Sig: Take 1 tablet (20 mg total) by mouth daily.    Dispense:  90 tablet    Refill:  3    Return precautions advised.  [name], MD

## 2015-10-18 NOTE — Assessment & Plan Note (Signed)
S: well controlled on crestor 40mg . No myalgias.  Lab Results  Component Value Date   CHOL 216 (H) 02/09/2013   HDL 60.20 02/09/2013   LDLCALC 92 11/02/2007   LDLDIRECT 98.0 10/18/2015   TRIG 324.0 (H) 02/09/2013   CHOLHDL 4 02/09/2013   A/P: continue current dose

## 2015-10-18 NOTE — Assessment & Plan Note (Signed)
S: high level stress with husband having parkinsons and some fear after fall leading to rib fractures A/P: we will titrate celexa to 20mg  and leave wellbutrin 300mg  with 2 month follow up. Return precautions advised

## 2015-10-18 NOTE — Progress Notes (Signed)
Pre visit review using our clinic review tool, if applicable. No additional management support is needed unless otherwise documented below in the visit note. 

## 2015-10-18 NOTE — Patient Instructions (Addendum)
You can cut the 40mg  pills in half of lisinopril and take until you run out and then pick up the new prescription. Recheck at follow up. Check at pharmacy if you are out- if over 140/90 see me sooner  Increase celexa to 20mg - follow up in 2 months to see if this is helping with anxiety. Glad you are trying to fill your life with things to help with stress such as Bible Study and volunteering. Exercise can help as well  Blood tests before you leave ads well as urine

## 2015-10-18 NOTE — Assessment & Plan Note (Signed)
S: controlled. On lisinopril 40mg  BP Readings from Last 3 Encounters:  10/18/15 108/60  06/29/15 136/72  06/27/15 136/78  A/P:Continue current meds:  First reading today in 90s, prior BPs higher while in pain from rib fracture- trial 20mg  and follow up 2 months

## 2015-10-19 NOTE — Addendum Note (Signed)
Addended by: Elmer Picker on: 10/19/2015 09:26 AM   Modules accepted: Orders

## 2015-10-20 LAB — URINALYSIS, MICROSCOPIC ONLY

## 2015-11-13 ENCOUNTER — Ambulatory Visit (INDEPENDENT_AMBULATORY_CARE_PROVIDER_SITE_OTHER): Payer: PPO | Admitting: Family Medicine

## 2015-11-13 ENCOUNTER — Encounter: Payer: Self-pay | Admitting: Family Medicine

## 2015-11-13 VITALS — BP 102/64 | HR 79 | Temp 97.9°F | Wt 155.4 lb

## 2015-11-13 DIAGNOSIS — M4802 Spinal stenosis, cervical region: Secondary | ICD-10-CM | POA: Diagnosis not present

## 2015-11-13 DIAGNOSIS — M79602 Pain in left arm: Secondary | ICD-10-CM

## 2015-11-13 DIAGNOSIS — M79601 Pain in right arm: Secondary | ICD-10-CM | POA: Diagnosis not present

## 2015-11-13 DIAGNOSIS — R202 Paresthesia of skin: Secondary | ICD-10-CM

## 2015-11-13 MED ORDER — PREDNISONE 20 MG PO TABS: 20.0000 mg | ORAL_TABLET | Freq: Every day | ORAL | 0 refills | Status: DC

## 2015-11-13 NOTE — Progress Notes (Signed)
Pre visit review using our clinic review tool, if applicable. No additional management support is needed unless otherwise documented below in the visit note. 

## 2015-11-13 NOTE — Progress Notes (Signed)
Subjective:  Xin A Mccleery is a 70 y.o. year old very pleasant female patient who presents for/with See problem oriented charting ROS- .see any ROS included in HPI/S section  Past Medical History-  Patient Active Problem List   Diagnosis Date Noted  . Essential tremor 04/18/2015    Priority: Medium  . CKD (chronic kidney disease), stage III 10/15/2013    Priority: Medium  . Hypercalcemia 01/28/2007    Priority: Medium  . TOBACCO USE 12/24/2006    Priority: Medium  . Hyperlipidemia 05/26/2006    Priority: Medium  . Anxiety state 05/26/2006    Priority: Medium  . Essential hypertension 05/26/2006    Priority: Medium  . Adenomatous colon polyp 06/14/2014    Priority: Low  . History of colonic polyps 04/14/2014    Priority: Low  . H/O cold sores 10/15/2013    Priority: Low  . GERD 05/26/2006    Priority: Low  . Diarrhea 04/14/2014  . Heavy alcohol use 04/14/2014    Medications- reviewed and updated Current Outpatient Prescriptions  Medication Sig Dispense Refill  . ALPRAZolam (XANAX) 0.25 MG tablet TAKE (1) TABLET DAILY AS NEEDED. 30 tablet 5  . aspirin 81 MG tablet Take 81 mg by mouth daily.      Marland Kitchen buPROPion (WELLBUTRIN XL) 300 MG 24 hr tablet TAKE 1 TABLET ONCE DAILY. 90 tablet 2  . citalopram (CELEXA) 20 MG tablet Take 1 tablet (20 mg total) by mouth daily. 90 tablet 3  . lisinopril (PRINIVIL,ZESTRIL) 20 MG tablet TAKE 1 TABLET EACH DAY. 90 tablet 3  . nystatin-triamcinolone (MYCOLOG II) cream Apply 1 application topically 3 (three) times daily as needed. Reported on 04/18/2015    . pantoprazole (PROTONIX) 40 MG tablet TAKE 1 TABLET ONCE DAILY. 90 tablet 1  . rosuvastatin (CRESTOR) 40 MG tablet TAKE 1 TABLET ONCE DAILY. 90 tablet 3  . predniSONE (DELTASONE) 20 MG tablet Take 1 tablet (20 mg total) by mouth daily with breakfast. 7 tablet 0   No current facility-administered medications for this visit.     Objective: BP 102/64 (BP Location: Left Arm, Patient Position:  Sitting, Cuff Size: Normal)   Pulse 79   Temp 97.9 F (36.6 C) (Oral)   Wt 155 lb 6.4 oz (70.5 kg)   SpO2 95%   BMI 27.53 kg/m  Gen: NAD, resting comfortably Neck rather stiff with poor range of motion- not able to complete true spurlings.  CV: RRR no murmurs rubs or gallops Lungs: CTAB no crackles, wheeze, rhonchi  Ext: no edema. Negative tinel's and phalen's.  Skin: warm, dry Neuro: g5/5 grip strength, intact distal sensation to gross touch  Assessment/Plan:  Spinal stenosis in cervical region  Paresthesia and pain of both upper extremities S: With neck surgery had tingling down arms a few times a month. Had spinal stenosis- had cadaver bone put in along with surgery. This was 10 years ago.   Now experiencing intermittent numbness and tingling into bilateral arms. Burning in fingertips as well. Been going on since Friday. Not at all times but present most of the time. Rates as moderate burning. She has seen Dr. Earle Gell of neurosurgery in the past and was told that she would likely need return visit.   ROS- no dropping of objects, not worse with neck turning, no fecal or urinary incontinence. Mild feeling of weakness in hands due to the pain- as in harder to open a jar due to pain but physicall still able to.  A/P: Suspet symptoms of  numbness/tingling/burning may be from cervical stenosis- will trial prednisone 20mg  x 7 days and then she will update me. Discussed more severe symptoms such as true weakness would need to be addressed immediately.   She is very sensitive to medicine so asks for trial of lower dose Meds ordered this encounter  Medications  . predniSONE (DELTASONE) 20 MG tablet    Sig: Take 1 tablet (20 mg total) by mouth daily with breakfast.    Dispense:  7 tablet    Refill:  0    Return precautions advised.  [name], MD

## 2015-11-13 NOTE — Patient Instructions (Signed)
Prednisone for 7 days  Should help within 48 hours  Give me an update at end of course as to how you are doing  Hoping we can avoid neurosurgery referral- if worsening symptoms including weakness in particular- let us know immediately

## 2015-11-15 ENCOUNTER — Other Ambulatory Visit: Payer: Self-pay | Admitting: Family Medicine

## 2015-11-24 ENCOUNTER — Encounter: Payer: Self-pay | Admitting: Family Medicine

## 2015-11-24 ENCOUNTER — Telehealth: Payer: Self-pay | Admitting: Family Medicine

## 2015-11-25 MED ORDER — PREDNISONE 10 MG PO TABS: ORAL_TABLET | ORAL | 0 refills | Status: DC

## 2015-11-27 ENCOUNTER — Encounter: Payer: Self-pay | Admitting: Family Medicine

## 2015-11-27 NOTE — Telephone Encounter (Signed)
I sent her a mychart message about this

## 2016-02-08 DIAGNOSIS — Z01419 Encounter for gynecological examination (general) (routine) without abnormal findings: Secondary | ICD-10-CM | POA: Diagnosis not present

## 2016-02-13 ENCOUNTER — Ambulatory Visit: Payer: PPO | Admitting: Family Medicine

## 2016-02-16 ENCOUNTER — Encounter: Payer: Self-pay | Admitting: Internal Medicine

## 2016-02-16 ENCOUNTER — Ambulatory Visit (INDEPENDENT_AMBULATORY_CARE_PROVIDER_SITE_OTHER): Payer: PPO | Admitting: Internal Medicine

## 2016-02-16 VITALS — BP 112/52 | HR 67 | Temp 98.2°F | Ht 63.0 in | Wt 149.8 lb

## 2016-02-16 DIAGNOSIS — M4802 Spinal stenosis, cervical region: Secondary | ICD-10-CM | POA: Diagnosis not present

## 2016-02-16 DIAGNOSIS — I10 Essential (primary) hypertension: Secondary | ICD-10-CM

## 2016-02-16 MED ORDER — PREDNISONE 10 MG PO TABS: ORAL_TABLET | ORAL | 0 refills | Status: DC

## 2016-02-16 NOTE — Progress Notes (Signed)
Subjective:    Patient ID: Crystal Carroll, female    DOB: Nov 05, 1945, 71 y.o.   MRN: [id number]  HPI 71 year old patient who has a prior history of spinal stenosis that required a discectomy in 2012. She was seen in November for paresthesias of the arms and responded quite well to a modest prednisone regimen. For the past week she has been experiencing numbness of the fingertips bilaterally.  Denies any motor weakness or any bowel or bladder complaints  Past Medical History:  Diagnosis Date  . Anxiety   . GERD (gastroesophageal reflux disease)   . Hyperlipidemia   . Hypertension      Social History   Social History  . Marital status: Married    Spouse name: N/A  . Number of children: 1  . Years of education: N/A   Occupational History  . Not on file.   Social History Main Topics  . Smoking status: Former Smoker    Packs/day: 0.75    Years: 50.00    Types: Cigarettes    Quit date: 03/07/2013  . Smokeless tobacco: Never Used  . Alcohol use 0.0 oz/week     Comment: 2-3 per day  . Drug use: No  . Sexual activity: Not on file   Other Topics Concern  . Not on file   Social History Narrative   Married to [facility] (patient) in 1981. 2nd marriage-1 child with 1 adopted grandchild.       Retired as Statistician.       Hobbies: antiques, former Firefighter      Exercise: none currently.           Past Surgical History:  Procedure Laterality Date  . CERVICAL LAMINECTOMY  2004   Earle Gell    Family History  Problem Relation Age of Onset  . Heart attack Father 46    smoker, rheumatic fever as child  . Melanoma Mother     labia    Allergies  Allergen Reactions  . Sulfamethoxazole     REACTION: GI upset    Current Outpatient Prescriptions on File Prior to Visit  Medication Sig Dispense Refill  . ALPRAZolam (XANAX) 0.25 MG tablet TAKE (1) TABLET DAILY AS NEEDED. 30 tablet 5  . aspirin 81 MG tablet Take 81 mg by mouth daily.      Marland Kitchen  buPROPion (WELLBUTRIN XL) 300 MG 24 hr tablet TAKE 1 TABLET ONCE DAILY. 90 tablet 1  . citalopram (CELEXA) 20 MG tablet Take 1 tablet (20 mg total) by mouth daily. 90 tablet 3  . lisinopril (PRINIVIL,ZESTRIL) 20 MG tablet TAKE 1 TABLET EACH DAY. 90 tablet 3  . nystatin-triamcinolone (MYCOLOG II) cream Apply 1 application topically 3 (three) times daily as needed. Reported on 04/18/2015    . pantoprazole (PROTONIX) 40 MG tablet TAKE 1 TABLET ONCE DAILY. 90 tablet 1  . rosuvastatin (CRESTOR) 40 MG tablet TAKE 1 TABLET ONCE DAILY. 90 tablet 3   No current facility-administered medications on file prior to visit.     BP (!) 112/52 (BP Location: Right Arm, Patient Position: Sitting, Cuff Size: Normal)   Pulse 67   Temp 98.2 F (36.8 C) (Oral)   Ht 5\' 3"  (1.6 m)   Wt 149 lb 12.8 oz (67.9 kg)   SpO2 98%   BMI 26.54 kg/m      Review of Systems  Constitutional: Negative.   HENT: Negative for congestion, dental problem, hearing loss, rhinorrhea, sinus pressure, sore throat and tinnitus.  Eyes: Negative for pain, discharge and visual disturbance.  Respiratory: Negative for cough and shortness of breath.   Cardiovascular: Negative for chest pain, palpitations and leg swelling.  Gastrointestinal: Negative for abdominal distention, abdominal pain, blood in stool, constipation, diarrhea, nausea and vomiting.  Genitourinary: Negative for difficulty urinating, dysuria, flank pain, frequency, hematuria, pelvic pain, urgency, vaginal bleeding, vaginal discharge and vaginal pain.  Musculoskeletal: Negative for arthralgias, gait problem and joint swelling.  Skin: Negative for rash.  Neurological: Positive for numbness. Negative for dizziness, syncope, speech difficulty, weakness and headaches.  Hematological: Negative for adenopathy.  Psychiatric/Behavioral: Negative for agitation, behavioral problems and dysphoric mood. The patient is not nervous/anxious.        Objective:   Physical Exam    Constitutional: She appears well-developed and well-nourished. No distress.  Neurological:  Grip strength appeared normal Reflexes brisk  Sensory exam subjectively normal          Assessment & Plan:   Bilateral arm paresthesias.  Will retreat with prednisone, which was quite helpful.  3 months ago. We'll set up for cervical MRI and follow-up with neurosurgery Essential hypertension, stable  Nyoka Cowden

## 2016-02-16 NOTE — Patient Instructions (Addendum)
Spinal Stenosis Spinal stenosis is an abnormal narrowing of the canals of your spine (vertebrae). CAUSES  Spinal stenosis is caused by areas of bone pushing into the central canals of your vertebrae. This condition can be present at birth (congenital). It also may be caused by arthritic deterioration of your vertebrae (spinal degeneration).  SYMPTOMS   Pain that is generally worse with activities, particularly standing and walking.  Numbness, tingling, hot or cold sensations, weakness, or weariness in your legs.  Frequent episodes of falling.  A foot-slapping gait that leads to muscle weakness. DIAGNOSIS  Spinal stenosis is diagnosed with the use of magnetic resonance imaging (MRI) or computed tomography (CT). TREATMENT  Initial therapy for spinal stenosis focuses on the management of the pain and other symptoms associated with the condition. These therapies include:  Practicing postural changes to lessen pressure on your nerves.  Exercises to strengthen the core of your body.  Loss of excess body weight.  The use of nonsteroidal anti-inflammatory medicines to reduce swelling and inflammation in your nerves. When therapies to manage pain are not successful, surgery to treat spinal stenosis may be recommended. This surgery involves removing excess bone, which puts pressure on your nerve roots. During this surgery (laminectomy), the posterior boney arch (lamina) and excess bone around the facet joints are removed. This information is not intended to replace advice given to you by your health care provider. Make sure you discuss any questions you have with your health care provider. Document Released: 03/16/2003 Document Revised: 01/14/2014 Document Reviewed: 04/03/2012 Elsevier Interactive Patient Education  2017 [employer]  Prednisone as directed  Cervical MRI  Neurosurgery consultation with Dr. Arnoldo Morale

## 2016-02-16 NOTE — Progress Notes (Signed)
Pre visit review using our clinic review tool, if applicable. No additional management support is needed unless otherwise documented below in the visit note. 

## 2016-02-20 ENCOUNTER — Ambulatory Visit: Payer: Self-pay | Admitting: Family Medicine

## 2016-03-11 ENCOUNTER — Ambulatory Visit
Admission: RE | Admit: 2016-03-11 | Discharge: 2016-03-11 | Disposition: A | Discharge status: Home / Self Care | Payer: PPO | Source: Ambulatory Visit | Attending: Internal Medicine | Admitting: Internal Medicine

## 2016-03-11 DIAGNOSIS — M4802 Spinal stenosis, cervical region: Secondary | ICD-10-CM

## 2016-03-25 ENCOUNTER — Encounter: Payer: Self-pay | Admitting: Family Medicine

## 2016-03-26 DIAGNOSIS — R200 Anesthesia of skin: Secondary | ICD-10-CM | POA: Diagnosis not present

## 2016-03-26 DIAGNOSIS — M4802 Spinal stenosis, cervical region: Secondary | ICD-10-CM | POA: Diagnosis not present

## 2016-03-26 DIAGNOSIS — M4312 Spondylolisthesis, cervical region: Secondary | ICD-10-CM | POA: Diagnosis not present

## 2016-04-02 ENCOUNTER — Ambulatory Visit (INDEPENDENT_AMBULATORY_CARE_PROVIDER_SITE_OTHER): Payer: PPO | Admitting: Family Medicine

## 2016-04-02 ENCOUNTER — Encounter: Payer: Self-pay | Admitting: Family Medicine

## 2016-04-02 DIAGNOSIS — M5412 Radiculopathy, cervical region: Secondary | ICD-10-CM | POA: Diagnosis not present

## 2016-04-02 DIAGNOSIS — I10 Essential (primary) hypertension: Secondary | ICD-10-CM | POA: Diagnosis not present

## 2016-04-02 DIAGNOSIS — Z7289 Other problems related to lifestyle: Secondary | ICD-10-CM

## 2016-04-02 DIAGNOSIS — Z636 Dependent relative needing care at home: Secondary | ICD-10-CM

## 2016-04-02 DIAGNOSIS — Z789 Other specified health status: Secondary | ICD-10-CM

## 2016-04-02 DIAGNOSIS — F1090 Alcohol use, unspecified, uncomplicated: Secondary | ICD-10-CM

## 2016-04-02 NOTE — Progress Notes (Signed)
Pre visit review using our clinic review tool, if applicable. No additional management support is needed unless otherwise documented below in the visit note. 

## 2016-04-02 NOTE — Progress Notes (Signed)
Subjective:  Crystal Carroll is a 71 y.o. year old very pleasant female patient who presents for/with See problem oriented charting ROS- no ches tpain or shortness of breath. Admits to stress and anxiety at times. Still with som earm pain and finger numbness (second portion residual from prior surgery0   Past Medical History-  Patient Active Problem List   Diagnosis Date Noted  . Cervical radiculopathy 04/03/2016    Priority: Medium  . Essential tremor 04/18/2015    Priority: Medium  . Heavy alcohol use 04/14/2014    Priority: Medium  . CKD (chronic kidney disease), stage III 10/15/2013    Priority: Medium  . Hypercalcemia 01/28/2007    Priority: Medium  . TOBACCO USE 12/24/2006    Priority: Medium  . Hyperlipidemia 05/26/2006    Priority: Medium  . Anxiety state 05/26/2006    Priority: Medium  . Essential hypertension 05/26/2006    Priority: Medium  . Adenomatous colon polyp 06/14/2014    Priority: Low  . H/O cold sores 10/15/2013    Priority: Low  . GERD 05/26/2006    Priority: Low  . Diarrhea 04/14/2014    Medications- reviewed and updated Current Outpatient Prescriptions  Medication Sig Dispense Refill  . ALPRAZolam (XANAX) 0.25 MG tablet TAKE (1) TABLET DAILY AS NEEDED. 30 tablet 5  . aspirin 81 MG tablet Take 81 mg by mouth daily.      Marland Kitchen buPROPion (WELLBUTRIN XL) 300 MG 24 hr tablet TAKE 1 TABLET ONCE DAILY. 90 tablet 1  . citalopram (CELEXA) 20 MG tablet Take 1 tablet (20 mg total) by mouth daily. 90 tablet 3  . lisinopril (PRINIVIL,ZESTRIL) 20 MG tablet TAKE 1 TABLET EACH DAY. 90 tablet 3  . nystatin-triamcinolone (MYCOLOG II) cream Apply 1 application topically 3 (three) times daily as needed. Reported on 04/18/2015    . pantoprazole (PROTONIX) 40 MG tablet TAKE 1 TABLET ONCE DAILY. 90 tablet 1  . rosuvastatin (CRESTOR) 40 MG tablet TAKE 1 TABLET ONCE DAILY. 90 tablet 3   No current facility-administered medications for this visit.     Objective: BP 104/60  (BP Location: Left Arm, Patient Position: Sitting, Cuff Size: Normal)   Pulse 77   Temp 97.9 F (36.6 C) (Oral)   Ht 5\' 3"  (1.6 m)   Wt 149 lb 12.8 oz (67.9 kg)   SpO2 97%   BMI 26.54 kg/m  Gen: NAD, resting comfortably CV: RRR no murmurs rubs or gallops Lungs: CTAB no crackles, wheeze, rhonchi Abdomen: soft/nontender/nondistended/normal bowel sounds. No rebound or guarding.  Ext: no edema Skin: warm, dry  Assessment/Plan:  Cervical radiculopathy S: Patient last saw Dr. Earle Gell 2003. She saw him again recently as had noted pain into both arms and numbness into both thumbs. She had an MRI and Prior hardware looked excellent. has some arthritic changes and foraminal narrowing.  Option of surgery was given but only mild places- as long as she thinks she can tolerate the difficulty. 1 year follow up planned.  A/P: patient is pleased that no surgery was recommended. Will continue to deal with pain and discomfort and contact Dr. Earle Gell for worsening symptoms  Heavy alcohol use Wine spritzer- one beverage a night. She has drastically cut down- likely helping her with weigh tloss. Praised her efforts  Essential hypertension S: controlled on lisinopril 40mg .  BP Readings from Last 3 Encounters:  04/02/16 104/60  02/16/16 (!) 112/52  11/13/15 102/64  A/P:Continue current meds:   6 lbs down since November. May have  to reduce medicine if weight loss continues  Caregiver burden We also spent over 15 minutes discussing the burden of caring for her husband with parkinsons and mental burden of not knowing what future holds. She wants to discuss next life steps and husband not willing- trying to get them both in for awv with Manuela Schwartz  The duration of face-to-face time during this visit was greater than 25 minutes. Greater than 50% of this time was spent in counseling, explanation of diagnosis, planning of further management, and/or coordination of care including 15 minute discussion on  caregiver burden as noted.    Return precautions advised.  [name], MD

## 2016-04-02 NOTE — Patient Instructions (Signed)
Great job on weight loss and cutting down on the wine!   For you and Brad. Could even sign up on same day.  I would also like for you to sign up for an annual wellness visit with our nurse, Manuela Schwartz, who specializes in the annual wellness exam. This is a free benefit under medicare that may help [place] find additional ways to help you. Some highlights are reviewing medications, lifestyle, and doing a dementia screen.

## 2016-04-03 DIAGNOSIS — M5412 Radiculopathy, cervical region: Secondary | ICD-10-CM | POA: Insufficient documentation

## 2016-04-03 DIAGNOSIS — Z636 Dependent relative needing care at home: Secondary | ICD-10-CM | POA: Insufficient documentation

## 2016-04-03 NOTE — Assessment & Plan Note (Signed)
Wine spritzer- one beverage a night. She has drastically cut down- likely helping her with weigh tloss. Praised her efforts

## 2016-04-03 NOTE — Assessment & Plan Note (Addendum)
We also spent over 15 minutes discussing the burden of caring for her husband with parkinsons and mental burden of not knowing what future holds. She wants to discuss next life steps and husband not willing- trying to get them both in for awv with Manuela Schwartz

## 2016-04-03 NOTE — Assessment & Plan Note (Signed)
S: controlled on lisinopril 40mg .  BP Readings from Last 3 Encounters:  04/02/16 104/60  02/16/16 (!) 112/52  11/13/15 102/64  A/P:Continue current meds:   6 lbs down since November. May have to reduce medicine if weight loss continues

## 2016-04-03 NOTE — Assessment & Plan Note (Signed)
S: Patient last saw Dr. Earle Gell 2003. She saw him again recently as had noted pain into both arms and numbness into both thumbs. She had an MRI and Prior hardware looked excellent. has some arthritic changes and foraminal narrowing.  Option of surgery was given but only mild places- as long as she thinks she can tolerate the difficulty. 1 year follow up planned.  A/P: patient is pleased that no surgery was recommended. Will continue to deal with pain and discomfort and contact Dr. Earle Gell for worsening symptoms

## 2016-05-06 ENCOUNTER — Other Ambulatory Visit: Payer: Self-pay | Admitting: Family Medicine

## 2016-05-10 DIAGNOSIS — Z1231 Encounter for screening mammogram for malignant neoplasm of breast: Secondary | ICD-10-CM | POA: Diagnosis not present

## 2016-05-10 LAB — HM MAMMOGRAPHY

## 2016-05-14 ENCOUNTER — Encounter: Payer: Self-pay | Admitting: Family Medicine

## 2016-05-20 ENCOUNTER — Other Ambulatory Visit: Payer: Self-pay | Admitting: Family Medicine

## 2016-05-20 ENCOUNTER — Ambulatory Visit: Payer: Self-pay | Admitting: Family Medicine

## 2016-07-18 ENCOUNTER — Ambulatory Visit: Payer: PPO | Admitting: Family Medicine

## 2016-07-19 ENCOUNTER — Encounter: Payer: Self-pay | Admitting: Family Medicine

## 2016-07-19 ENCOUNTER — Ambulatory Visit (INDEPENDENT_AMBULATORY_CARE_PROVIDER_SITE_OTHER): Payer: PPO | Admitting: Family Medicine

## 2016-07-19 VITALS — BP 92/58 | HR 73 | Temp 97.6°F | Ht 63.0 in | Wt 153.6 lb

## 2016-07-19 DIAGNOSIS — E785 Hyperlipidemia, unspecified: Secondary | ICD-10-CM | POA: Diagnosis not present

## 2016-07-19 DIAGNOSIS — I10 Essential (primary) hypertension: Secondary | ICD-10-CM | POA: Diagnosis not present

## 2016-07-19 DIAGNOSIS — F411 Generalized anxiety disorder: Secondary | ICD-10-CM | POA: Diagnosis not present

## 2016-07-19 DIAGNOSIS — R635 Abnormal weight gain: Secondary | ICD-10-CM

## 2016-07-19 DIAGNOSIS — L659 Nonscarring hair loss, unspecified: Secondary | ICD-10-CM

## 2016-07-19 LAB — TSH: TSH: 1.09 mIU/L

## 2016-07-19 MED ORDER — LISINOPRIL 10 MG PO TABS: 10.0000 mg | ORAL_TABLET | Freq: Every day | ORAL | 3 refills | Status: DC

## 2016-07-19 MED ORDER — ALPRAZOLAM 0.25 MG PO TABS: ORAL_TABLET | ORAL | 0 refills | Status: DC

## 2016-07-19 NOTE — Assessment & Plan Note (Signed)
S: controlled on lisinopril 20mg .  BP Readings from Last 3 Encounters:  07/19/16 (!) 92/58  04/02/16 104/60  02/16/16 (!) 112/52  A/P: We discussed blood pressure goal of <140/90. Continue current meds:  But further reduce to 10mg  lisinopril

## 2016-07-19 NOTE — Patient Instructions (Signed)
Please stop by lab before you go  You agreed to 1 alcoholic beverage max per day (0 is better)  #30 xanax given- want this to last you at least 6 months  You agreed to counseling with Dr. Glennon Hamilton- call # on handout  Lets check in 2 months from now to check on weight, alcohol, anxiety

## 2016-07-19 NOTE — Assessment & Plan Note (Signed)
S: anxiety stable on celexa 20mg  and wellbutrin 300mg  with sparing xanax. Last #30 xanax refill would have been 10/2015. States she uses very sparingly for stress as caregiver.   On unfortunate side, prior wine spritzer one beverage each night is up to 4-5 at times. Likely contributing to 4 lbs weigh tgain.  A/P: she agrees to limit alcohol to max 1 per day. I agreed to refill xanax #30 but continue celexa and wellbutrin. She agrees to counseling with Dr. Glennon Hamilton to help her deal with stress.

## 2016-07-19 NOTE — Progress Notes (Signed)
Subjective:  Hanae A Gutter is a 71 y.o. year old very pleasant female patient who presents for/with See problem oriented charting ROS- weight gain, hair thinning. No chest pain. No shortness of breath.    Past Medical History-  Patient Active Problem List   Diagnosis Date Noted  . Cervical radiculopathy 04/03/2016    Priority: Medium  . Essential tremor 04/18/2015    Priority: Medium  . Heavy alcohol use 04/14/2014    Priority: Medium  . CKD (chronic kidney disease), stage III 10/15/2013    Priority: Medium  . Hypercalcemia 01/28/2007    Priority: Medium  . TOBACCO USE 12/24/2006    Priority: Medium  . Hyperlipidemia 05/26/2006    Priority: Medium  . Anxiety state 05/26/2006    Priority: Medium  . Essential hypertension 05/26/2006    Priority: Medium  . Adenomatous colon polyp 06/14/2014    Priority: Low  . H/O cold sores 10/15/2013    Priority: Low  . GERD 05/26/2006    Priority: Low  . Caregiver burden 04/03/2016  . Diarrhea 04/14/2014    Medications- reviewed and updated Current Outpatient Prescriptions  Medication Sig Dispense Refill  . ALPRAZolam (XANAX) 0.25 MG tablet TAKE (1) TABLET DAILY AS NEEDED. 30 tablet 5  . aspirin 81 MG tablet Take 81 mg by mouth daily.      Marland Kitchen buPROPion (WELLBUTRIN XL) 300 MG 24 hr tablet TAKE 1 TABLET ONCE DAILY. 90 tablet 1  . citalopram (CELEXA) 20 MG tablet Take 1 tablet (20 mg total) by mouth daily. 90 tablet 3  . lisinopril (PRINIVIL,ZESTRIL) 20 MG tablet TAKE 1 TABLET EACH DAY. 90 tablet 3  . nystatin-triamcinolone (MYCOLOG II) cream Apply 1 application topically 3 (three) times daily as needed. Reported on 04/18/2015    . pantoprazole (PROTONIX) 40 MG tablet TAKE 1 TABLET ONCE DAILY. 90 tablet 1  . rosuvastatin (CRESTOR) 40 MG tablet TAKE 1 TABLET ONCE DAILY. 90 tablet 3   Objective: BP (!) 92/58 (BP Location: Left Arm, Patient Position: Sitting, Cuff Size: Large)   Pulse 73   Temp 97.6 F (36.4 C) (Oral)   Ht 5\' 3"  (1.6 m)    Wt 153 lb 9.6 oz (69.7 kg)   SpO2 97%   BMI 27.21 kg/m  Gen: NAD, resting comfortably CV: RRR no murmurs rubs or gallops Lungs: CTAB no crackles, wheeze, rhonchi Abdomen: obese Ext: no edema Skin: warm, dry  Assessment/Plan:  Check tsh- hair and weight gain (though likely related to increased alcohol intake)  Essential hypertension S: controlled on lisinopril 20mg .  BP Readings from Last 3 Encounters:  07/19/16 (!) 92/58  04/02/16 104/60  02/16/16 (!) 112/52  A/P: We discussed blood pressure goal of <140/90. Continue current meds:  But further reduce to 10mg  lisinopril   Hyperlipidemia S: reasonably controlled on crestor 40mg  with last LDL of 98. No myalgias.  A/P: continue current rx  Anxiety state S: anxiety stable on celexa 20mg  and wellbutrin 300mg  with sparing xanax. Last #30 xanax refill would have been 10/2015. States she uses very sparingly for stress as caregiver.   On unfortunate side, prior wine spritzer one beverage each night is up to 4-5 at times. Likely contributing to 4 lbs weigh tgain.  A/P: she agrees to limit alcohol to max 1 per day. I agreed to refill xanax #30 but continue celexa and wellbutrin. She agrees to counseling with Dr. Glennon Hamilton to help her deal with stress.  2 month follow up  Orders Placed This Encounter  Procedures  .  TSH    [place]  . LDL cholesterol, direct    [place]  . Basic metabolic panel    [facility]   Meds ordered this encounter  Medications  . lisinopril (PRINIVIL,ZESTRIL) 10 MG tablet    Sig: Take 1 tablet (10 mg total) by mouth daily.    Dispense:  90 tablet    Refill:  3  . ALPRAZolam (XANAX) 0.25 MG tablet    Sig: TAKE (1) TABLET DAILY AS NEEDED.    Dispense:  30 tablet    Refill:  0   Return precautions advised.  [name], MD

## 2016-07-19 NOTE — Assessment & Plan Note (Signed)
S: reasonably controlled on crestor 40mg  with last LDL of 98. No myalgias.  A/P: continue current rx

## 2016-07-20 LAB — BASIC METABOLIC PANEL
BUN: 20 mg/dL (ref 7–25)
CALCIUM: 10.1 mg/dL (ref 8.6–10.4)
CHLORIDE: 104 mmol/L (ref 98–110)
CO2: 21 mmol/L (ref 20–31)
Creat: 1.33 mg/dL — ABNORMAL HIGH (ref 0.60–0.93)
Glucose, Bld: 98 mg/dL (ref 65–99)
POTASSIUM: 4.1 mmol/L (ref 3.5–5.3)
SODIUM: 138 mmol/L (ref 135–146)

## 2016-07-20 LAB — LDL CHOLESTEROL, DIRECT: Direct LDL: 106 mg/dL — ABNORMAL HIGH (ref <100)

## 2016-07-22 ENCOUNTER — Ambulatory Visit (INDEPENDENT_AMBULATORY_CARE_PROVIDER_SITE_OTHER): Payer: PPO

## 2016-07-22 VITALS — BP 100/58 | HR 68 | Resp 16 | Ht 63.0 in | Wt 149.3 lb

## 2016-07-22 DIAGNOSIS — Z780 Asymptomatic menopausal state: Secondary | ICD-10-CM | POA: Diagnosis not present

## 2016-07-22 DIAGNOSIS — Z0000 Encounter for general adult medical examination without abnormal findings: Secondary | ICD-10-CM | POA: Diagnosis not present

## 2016-07-22 NOTE — Progress Notes (Signed)
Subjective:   Crystal Carroll is a 71 y.o. female who presents for an Initial Medicare Annual Wellness Visit.  2 Spritzers/day. States she is trying to cut back more.   Pt states that she has been experiencing hair loss x 6 months. Pt is wondering if she should see a dermatologist or Dr Crystal Carroll.   Review of Systems    No ROS.  Medicare Wellness Visit. Additional risk factors are reflected in the social history.  Cardiac Risk Factors include: advanced age (>75men, >44 women);hypertension;dyslipidemia   Sleep patterns: States she is a night person. States she does not like to wake up in the morning.   Home Safety/Smoke Alarms: Feels safe in home. Smoke alarms in place.  Living environment; residence and Firearm Safety: Lives with husband who recently was diagnosed with Parkinson's Disease. Two story home. Seat Belt Safety/Bike Helmet: Wears seat belt.   Female:   Pap-    Every 2 years, Crystal Carroll.   Mammo-  05/10/2016 Negative Dexa scan- 05/16/2014  Osteopenia CCS-  05/31/2014  5 year recall    Objective:    Today's Vitals   07/22/16 1529  BP: (!) 100/58  Pulse: 68  Resp: 16  SpO2: 98%  Weight: 149 lb 4.8 oz (67.7 kg)  Height: 5\' 3"  (1.6 m)   Body mass index is 26.45 kg/m.   Current Medications (verified) Outpatient Encounter Prescriptions as of 07/22/2016  Medication Sig  . ALPRAZolam (XANAX) 0.25 MG tablet TAKE (1) TABLET DAILY AS NEEDED.  Marland Kitchen aspirin 81 MG tablet Take 81 mg by mouth daily.    Marland Kitchen buPROPion (WELLBUTRIN XL) 300 MG 24 hr tablet TAKE 1 TABLET ONCE DAILY.  . citalopram (CELEXA) 20 MG tablet Take 1 tablet (20 mg total) by mouth daily.  Marland Kitchen lisinopril (PRINIVIL,ZESTRIL) 10 MG tablet Take 1 tablet (10 mg total) by mouth daily.  Marland Kitchen nystatin-triamcinolone (MYCOLOG II) cream Apply 1 application topically 3 (three) times daily as needed. Reported on 04/18/2015  . pantoprazole (PROTONIX) 40 MG tablet TAKE 1 TABLET ONCE DAILY.  . rosuvastatin (CRESTOR) 40 MG tablet TAKE  1 TABLET ONCE DAILY.   No facility-administered encounter medications on file as of 07/22/2016.     Allergies (verified) Sulfamethoxazole   History: Past Medical History:  Diagnosis Date  . Anxiety   . GERD (gastroesophageal reflux disease)   . Hyperlipidemia   . Hypertension    Past Surgical History:  Procedure Laterality Date  . CERVICAL LAMINECTOMY  2004   [facility]   Family History  Problem Relation Age of Onset  . Heart attack Father 83       smoker, rheumatic fever as child  . Melanoma Mother        labia   Social History   Occupational History  . Not on file.   Social History Main Topics  . Smoking status: Former Smoker    Packs/day: 0.75    Years: 50.00    Types: Cigarettes    Quit date: 03/07/2013  . Smokeless tobacco: Never Used  . Alcohol use 0.0 oz/week     Comment: 2 per day  . Drug use: No  . Sexual activity: Not on file    Tobacco Counseling Counseling given: Not Answered   Activities of Daily Living In your present state of health, do you have any difficulty performing the following activities: 07/22/2016  Hearing? N  Vision? N  Difficulty concentrating or making decisions? N  Walking or climbing stairs? N  Dressing or bathing?  N  Doing errands, shopping? N  Preparing Food and eating ? N  Using the Toilet? N  In the past six months, have you accidently leaked urine? N  Do you have problems with loss of bowel control? N  Managing your Medications? N  Managing your Finances? N  Housekeeping or managing your Housekeeping? N  Some recent data might be hidden    Immunizations and Health Maintenance Immunization History  Administered Date(s) Administered  . Influenza Split 12/03/2010, 12/09/2011  . Influenza Whole 10/18/2009  . Influenza, High Dose Seasonal PF 11/02/2012, 10/18/2015  . Influenza,inj,Quad PF,36+ Mos 10/15/2013, 12/06/2014  . Pneumococcal Conjugate-13 04/15/2014  . Pneumococcal Polysaccharide-23 12/03/2010  . Td  01/07/1998, 10/18/2009  . Zoster 10/18/2009   There are no preventive care reminders to display for this patient.  Patient Care Team: Marin Olp, MD as PCP - General (Family Medicine)  Indicate any recent Medical Services you may have received from other than [facility] providers in the past year (date may be approximate).     Assessment:   This is a routine wellness examination for Crystal Carroll. Physical assessment deferred to PCP.   Hearing/Vision screen Hearing Screening Comments: Able to hear conversational tones w/o difficulty. No issues reported.  Vision Screening Comments: Yearly. Dr Crystal Carroll.  Dietary issues and exercise activities discussed: Current Exercise Habits: The patient does not participate in regular exercise at present   Diet (meal preparation, eat out, water intake, caffeinated beverages, dairy products, fruits and vegetables):  8 oz water/day. I suggested she increase her water intake.     Goals    None     Depression Screen PHQ 2/9 Scores 07/22/2016 04/02/2016 03/10/2015 02/17/2014 02/09/2013  PHQ - 2 Score 0 0 0 0 0    Fall Risk Fall Risk  07/22/2016 04/02/2016 03/10/2015 02/17/2014 02/09/2013  Falls in the past year? No No No No No    Cognitive Function:   Ad8 score reviewed for issues:  Issues making decisions:no  Less interest in hobbies / activities:no  Repeats questions, stories (family complaining):no  Trouble using ordinary gadgets (microwave, computer, phone):no  Forgets the month or year: no  Mismanaging finances: no  Remembering appts:no  Daily problems with thinking and/or memory:no Ad8 score is=0       Screening Tests Health Maintenance  Topic Date Due  . INFLUENZA VACCINE  08/07/2016  . MAMMOGRAM  05/11/2018  . COLONOSCOPY  05/31/2019  . TETANUS/TDAP  10/19/2019  . DEXA SCAN  Completed  . Hepatitis C Screening  Completed  . PNA vac Low Risk Adult  Completed      Plan:   Pt would like guidance regarding her hair loss, she  considered seeing her dermatologist but wondered if she should see Dr Crystal Carroll. She has a dermatologist that she has seen in the past.  Bring a copy of your advance directives to your next office visit.  Order placed for bone density.   Pt would like order for Shingrix sent to [facility], I explained that pharmacies should not required prescription.  I have personally reviewed and noted the following in the patient's chart:   . Medical and social history . Use of alcohol, tobacco or illicit drugs  . Current medications and supplements . Functional ability and status . Nutritional status . Physical activity . Advanced directives . List of other physicians . Vitals . Screenings to include cognitive, depression, and falls . Referrals and appointments  In addition, I have reviewed and discussed with patient certain  preventive protocols, quality metrics, and best practice recommendations. A written personalized care plan for preventive services as well as general preventive health recommendations were provided to patient.     Ree Edman, RN   07/22/2016

## 2016-07-22 NOTE — Patient Instructions (Addendum)
Bring a copy of your advance directives to your next office visit. Schedule Bone Density Exam. Please find out the name of your dermatologist.  Please increase your water intake, at least 4-5 glasses/day.  Preventive Care 72 Years and Older, Female Preventive care refers to lifestyle choices and visits with your health care provider that can promote health and wellness. What does preventive care include?  A yearly physical exam. This is also called an annual well check.  Dental exams once or twice a year.  Routine eye exams. Ask your health care provider how often you should have your eyes checked.  Personal lifestyle choices, including: ? Daily care of your teeth and gums. ? Regular physical activity. ? Eating a healthy diet. ? Avoiding tobacco and drug use. ? Limiting alcohol use. ? Practicing safe sex. ? Taking low-dose aspirin every day. ? Taking vitamin and mineral supplements as recommended by your health care provider. What happens during an annual well check? The services and screenings done by your health care provider during your annual well check will depend on your age, overall health, lifestyle risk factors, and family history of disease. Counseling Your health care provider may ask you questions about your:  Alcohol use.  Tobacco use.  Drug use.  Emotional well-being.  Home and relationship well-being.  Sexual activity.  Eating habits.  History of falls.  Memory and ability to understand (cognition).  Work and work Statistician.  Reproductive health.  Screening You may have the following tests or measurements:  Height, weight, and BMI.  Blood pressure.  Lipid and cholesterol levels. These may be checked every 5 years, or more frequently if you are over 72 years old.  Skin check.  Lung cancer screening. You may have this screening every year starting at age 72 if you have a 30-pack-year history of smoking and currently smoke or have quit  within the past 15 years.  Fecal occult blood test (FOBT) of the stool. You may have this test every year starting at age 72.  Flexible sigmoidoscopy or colonoscopy. You may have a sigmoidoscopy every 5 years or a colonoscopy every 10 years starting at age 70.  Hepatitis C blood test.  Hepatitis B blood test.  Sexually transmitted disease (STD) testing.  Diabetes screening. This is done by checking your blood sugar (glucose) after you have not eaten for a while (fasting). You may have this done every 1-3 years.  Bone density scan. This is done to screen for osteoporosis. You may have this done starting at age 72.  Mammogram. This may be done every 1-2 years. Talk to your health care provider about how often you should have regular mammograms.  Talk with your health care provider about your test results, treatment options, and if necessary, the need for more tests. Vaccines Your health care provider may recommend certain vaccines, such as:  Influenza vaccine. This is recommended every year.  Tetanus, diphtheria, and acellular pertussis (Tdap, Td) vaccine. You may need a Td booster every 10 years.  Varicella vaccine. You may need this if you have not been vaccinated.  Zoster vaccine. You may need this after age 72.  Measles, mumps, and rubella (MMR) vaccine. You may need at least one dose of MMR if you were born in 1957 or later. You may also need a second dose.  Pneumococcal 13-valent conjugate (PCV13) vaccine. One dose is recommended after age 72.  Pneumococcal polysaccharide (PPSV23) vaccine. One dose is recommended after age 72.  Meningococcal vaccine. You may  need this if you have certain conditions.  Hepatitis A vaccine. You may need this if you have certain conditions or if you travel or work in places where you may be exposed to hepatitis A.  Hepatitis B vaccine. You may need this if you have certain conditions or if you travel or work in places where you may be exposed  to hepatitis B.  Haemophilus influenzae type b (Hib) vaccine. You may need this if you have certain conditions.  Talk to your health care provider about which screenings and vaccines you need and how often you need them. This information is not intended to replace advice given to you by your health care provider. Make sure you discuss any questions you have with your health care provider. Document Released: 01/20/2015 Document Revised: 09/13/2015 Document Reviewed: 10/25/2014 Elsevier Interactive Patient Education  2017 [employer].

## 2016-07-22 NOTE — Progress Notes (Signed)
Pre visit review using our clinic review tool, if applicable. No additional management support is needed unless otherwise documented below in the visit note. 

## 2016-07-22 NOTE — Progress Notes (Signed)
I have reviewed and agree with note, evaluation, plan. Roselyn Reef- can you please follow up with patient and advise she follow up with dermatology for hair loss?  [name], MD

## 2016-07-30 ENCOUNTER — Telehealth: Payer: Self-pay

## 2016-07-30 NOTE — Telephone Encounter (Signed)
-----   Message from Marin Olp, MD sent at 07/22/2016  7:50 PM EDT -----   ----- Message ----- From: Stephanie Acre, RN Sent: 07/22/2016   4:34 PM To: Marin Olp, MD

## 2016-07-30 NOTE — Telephone Encounter (Signed)
Called and left voicemail messages on both her home number and her cell number asking for a return phone call

## 2016-07-31 ENCOUNTER — Telehealth: Payer: Self-pay

## 2016-07-31 ENCOUNTER — Telehealth: Payer: Self-pay | Admitting: Family Medicine

## 2016-07-31 NOTE — Telephone Encounter (Signed)
-----   Message from Marin Olp, MD sent at 07/22/2016  7:50 PM EDT -----   ----- Message ----- From: Stephanie Acre, RN Sent: 07/22/2016   4:34 PM To: Marin Olp, MD

## 2016-07-31 NOTE — Telephone Encounter (Signed)
Crystal Carroll pt returned your call. °

## 2016-07-31 NOTE — Telephone Encounter (Signed)
Spoke with patient and she has not called yet but she is going to.  [facility] - Dr. Teryl Lucy

## 2016-07-31 NOTE — Telephone Encounter (Signed)
Spoke with patient and she has not called yet but she is going to.  [facility] - Dr. Teryl Lucy

## 2016-08-23 ENCOUNTER — Other Ambulatory Visit: Payer: Self-pay | Admitting: Family Medicine

## 2016-08-23 ENCOUNTER — Encounter (HOSPITAL_COMMUNITY): Payer: Self-pay | Admitting: Emergency Medicine

## 2016-08-23 ENCOUNTER — Emergency Department (HOSPITAL_COMMUNITY)
Admission: EM | Admit: 2016-08-23 | Discharge: 2016-08-23 | Disposition: A | Discharge status: Home / Self Care | Payer: PPO | Attending: Emergency Medicine | Admitting: Emergency Medicine

## 2016-08-23 DIAGNOSIS — Z5321 Procedure and treatment not carried out due to patient leaving prior to being seen by health care provider: Secondary | ICD-10-CM | POA: Insufficient documentation

## 2016-08-23 DIAGNOSIS — M79641 Pain in right hand: Secondary | ICD-10-CM | POA: Diagnosis present

## 2016-08-23 NOTE — ED Notes (Signed)
Made aware by secretary patient turned in her stickers and asked to be d/c'd while this RN was transporting a CP.  Will d/c

## 2016-08-23 NOTE — ED Triage Notes (Signed)
Pt presents with steam burn to R hand from cooking, slight redness and swelling noted. Pt attempted several at home remedies without success.

## 2016-10-21 ENCOUNTER — Ambulatory Visit (INDEPENDENT_AMBULATORY_CARE_PROVIDER_SITE_OTHER): Payer: PPO

## 2016-10-21 DIAGNOSIS — Z23 Encounter for immunization: Secondary | ICD-10-CM

## 2016-11-23 ENCOUNTER — Other Ambulatory Visit: Payer: Self-pay | Admitting: Family Medicine

## 2016-12-02 ENCOUNTER — Encounter: Payer: Self-pay | Admitting: Family Medicine

## 2016-12-02 ENCOUNTER — Ambulatory Visit: Payer: PPO | Admitting: Family Medicine

## 2016-12-02 VITALS — BP 118/60 | HR 55 | Temp 97.8°F | Ht 64.0 in | Wt 151.0 lb

## 2016-12-02 DIAGNOSIS — R1011 Right upper quadrant pain: Secondary | ICD-10-CM

## 2016-12-02 NOTE — Progress Notes (Signed)
Subjective:  Crystal Carroll is a 71 y.o. year old very pleasant female patient who presents for/with See problem oriented charting ROS- no chest pain, shortness of breath, nausea, vomiting, constipation   Past Medical History-  Patient Active Problem List   Diagnosis Date Noted  . Cervical radiculopathy 04/03/2016    Priority: Medium  . Essential tremor 04/18/2015    Priority: Medium  . Heavy alcohol use 04/14/2014    Priority: Medium  . CKD (chronic kidney disease), stage III ([place]) 10/15/2013    Priority: Medium  . Hypercalcemia 01/28/2007    Priority: Medium  . TOBACCO USE 12/24/2006    Priority: Medium  . Hyperlipidemia 05/26/2006    Priority: Medium  . Anxiety state 05/26/2006    Priority: Medium  . Essential hypertension 05/26/2006    Priority: Medium  . Adenomatous colon polyp 06/14/2014    Priority: Low  . H/O cold sores 10/15/2013    Priority: Low  . GERD 05/26/2006    Priority: Low  . Caregiver burden 04/03/2016  . Diarrhea 04/14/2014    Medications- reviewed and updated Current Outpatient Medications  Medication Sig Dispense Refill  . ALPRAZolam (XANAX) 0.25 MG tablet TAKE (1) TABLET DAILY AS NEEDED. 30 tablet 0  . aspirin 81 MG tablet Take 81 mg by mouth daily.      Marland Kitchen buPROPion (WELLBUTRIN XL) 300 MG 24 hr tablet TAKE 1 TABLET ONCE DAILY. 90 tablet 0  . citalopram (CELEXA) 20 MG tablet TAKE 1 TABLET ONCE DAILY. 90 tablet 0  . lisinopril (PRINIVIL,ZESTRIL) 10 MG tablet Take 1 tablet (10 mg total) by mouth daily. 90 tablet 3  . nystatin-triamcinolone (MYCOLOG II) cream Apply 1 application topically 3 (three) times daily as needed. Reported on 04/18/2015    . pantoprazole (PROTONIX) 40 MG tablet TAKE 1 TABLET ONCE DAILY. 90 tablet 0  . rosuvastatin (CRESTOR) 40 MG tablet TAKE 1 TABLET ONCE DAILY. 90 tablet 0   No current facility-administered medications for this visit.     Objective: BP 118/60 (BP Location: Left Arm, Patient Position: Sitting, Cuff Size:  Normal)   Pulse (!) 55   Temp 97.8 F (36.6 C) (Oral)   Ht 5\' 4"  (1.626 m)   Wt 151 lb (68.5 kg)   SpO2 97%   BMI 25.92 kg/m  Gen: NAD, resting comfortably CV: RRR no murmurs rubs or gallops Lungs: CTAB no crackles, wheeze, rhonchi Abdomen: soft/nontender- only tender at bottom of right side of ribs-  More toward mid axillary line/nondistended/normal bowel sounds. No rebound or guarding. obese Ext: no edema Skin: warm, dry, no rash  Assessment/Plan:  Right upper quadrant pain S: right upper quadrant soreness for 2-3 days. Not worse after meals or fatty foods. Only hurts if pushes on it. States mashing on it a lot. Not getting worse. Concerned about cancer. Pain up to 3/10 maximum. Not sharp. Mild soreness. Thinks possible could have had mild trauma. Remains down at only 1 drink a night which is significant improvement for her A/P: Pain reproducible over ribs. Strongly suspect msk cause. No exertional cause to suggest cardiac. Minimal pain on abdominal exam- doubt GI source though did discuss Cbc, cmp, ultrasound if not improving. See other conservative measures advised below  Also spent some time counseling about caregiver burden for husband with parkinsons and related stressors.   Patient Instructions  Try heat 20 minutes 3-4x a day on the tender area  Avoid pushing on the area  May try tylenol 650 mg as needed up to  every 6 hours as well  Give this about a week- if have fever, worsening symptoms including pain, nausea, vomiting- please see [place] back. We could certainly get labs and an ultrasound if you are not feeling better within a week  Return precautions advised.  [name], MD

## 2016-12-02 NOTE — Patient Instructions (Signed)
Try heat 20 minutes 3-4x a day on the tender area  Avoid pushing on the area  May try tylenol 650 mg as needed up to every 6 hours as well  Give this about a week- if have fever, worsening symptoms including pain, nausea, vomiting- please see [place] back. We could certainly get labs and an ultrasound if you are not feeling better within a week

## 2017-02-12 ENCOUNTER — Encounter: Payer: Self-pay | Admitting: Family Medicine

## 2017-02-12 ENCOUNTER — Ambulatory Visit: Payer: PPO | Admitting: Family Medicine

## 2017-02-12 VITALS — BP 100/58 | HR 65 | Temp 98.3°F | Ht 64.0 in | Wt 149.4 lb

## 2017-02-12 DIAGNOSIS — I10 Essential (primary) hypertension: Secondary | ICD-10-CM

## 2017-02-12 DIAGNOSIS — E785 Hyperlipidemia, unspecified: Secondary | ICD-10-CM | POA: Diagnosis not present

## 2017-02-12 NOTE — Patient Instructions (Signed)
No changes today  The spot you are feeling in your skull is the sagittal suture of the skull- this is normal  Come fasting in 3 months for physical

## 2017-02-12 NOTE — Assessment & Plan Note (Signed)
S: mild poorly controlled on crestor 40mg - had planned to work on weight loss with last LDL 106.  She is down 2 lbs from last visit A/P: Due for full fasting lipids- will do at physical

## 2017-02-12 NOTE — Assessment & Plan Note (Signed)
S: controlled on lisinopril 10mg  BP Readings from Last 3 Encounters:  02/12/17 (!) 100/58  12/02/16 118/60  08/23/16 114/61  A/P: We discussed blood pressure goal of <140/90. Continue current meds- may have to go down to 5mg  next visit if continues weight loss. Cutting down on alcohol has really helped her BP it seems.

## 2017-02-12 NOTE — Progress Notes (Signed)
Subjective:  Crystal Carroll is a 72 y.o. year old very pleasant female patient who presents for/with See problem oriented charting ROS- No chest pain or shortness of breath. No headache or blurry vision.    Past Medical History-  Patient Active Problem List   Diagnosis Date Noted  . Cervical radiculopathy 04/03/2016    Priority: Medium  . Essential tremor 04/18/2015    Priority: Medium  . Heavy alcohol use 04/14/2014    Priority: Medium  . CKD (chronic kidney disease), stage III ([place]) 10/15/2013    Priority: Medium  . Hypercalcemia 01/28/2007    Priority: Medium  . TOBACCO USE 12/24/2006    Priority: Medium  . Hyperlipidemia 05/26/2006    Priority: Medium  . Anxiety state 05/26/2006    Priority: Medium  . Essential hypertension 05/26/2006    Priority: Medium  . Adenomatous colon polyp 06/14/2014    Priority: Low  . H/O cold sores 10/15/2013    Priority: Low  . GERD 05/26/2006    Priority: Low  . Caregiver burden 04/03/2016  . Diarrhea 04/14/2014    Medications- reviewed and updated Current Outpatient Medications  Medication Sig Dispense Refill  . ALPRAZolam (XANAX) 0.25 MG tablet TAKE (1) TABLET DAILY AS NEEDED. 30 tablet 0  . aspirin 81 MG tablet Take 81 mg by mouth daily.      Marland Kitchen buPROPion (WELLBUTRIN XL) 300 MG 24 hr tablet TAKE 1 TABLET ONCE DAILY. 90 tablet 0  . citalopram (CELEXA) 20 MG tablet TAKE 1 TABLET ONCE DAILY. 90 tablet 0  . lisinopril (PRINIVIL,ZESTRIL) 10 MG tablet Take 1 tablet (10 mg total) by mouth daily. 90 tablet 3  . nystatin-triamcinolone (MYCOLOG II) cream Apply 1 application topically 3 (three) times daily as needed. Reported on 04/18/2015    . pantoprazole (PROTONIX) 40 MG tablet TAKE 1 TABLET ONCE DAILY. 90 tablet 0  . rosuvastatin (CRESTOR) 40 MG tablet TAKE 1 TABLET ONCE DAILY. 90 tablet 0   Objective: BP (!) 100/58 (BP Location: Left Arm, Patient Position: Sitting, Cuff Size: Large)   Pulse 65   Temp 98.3 F (36.8 C) (Oral)   Ht 5\' 4"   (1.626 m)   Wt 149 lb 6.4 oz (67.8 kg)   SpO2 96%   BMI 25.64 kg/m  Gen: NAD, resting comfortably CV: RRR no murmurs rubs or gallops Lungs: CTAB no crackles, wheeze, rhonchi Abdomen: soft/nontender/nondistended/normal bowel sounds.  Ext: no edema Skin: warm, dry  Assessment/Plan:  Patient came in concerned about ridge in her skull- explained this is normal- it is her sagittal suture of the skull  Essential hypertension S: controlled on lisinopril 10mg  BP Readings from Last 3 Encounters:  02/12/17 (!) 100/58  12/02/16 118/60  08/23/16 114/61  A/P: We discussed blood pressure goal of <140/90. Continue current meds- may have to go down to 5mg  next visit if continues weight loss. Cutting down on alcohol has really helped her BP it seems.   Hyperlipidemia S: mild poorly controlled on crestor 40mg - had planned to work on weight loss with last LDL 106.  She is down 2 lbs from last visit A/P: Due for full fasting lipids- will do at physical  Return in about 3 months (around 05/12/2017) for physical.  Return precautions advised.  [name], MD

## 2017-02-24 ENCOUNTER — Other Ambulatory Visit: Payer: Self-pay | Admitting: Family Medicine

## 2017-02-27 ENCOUNTER — Other Ambulatory Visit: Payer: Self-pay | Admitting: Family Medicine

## 2017-03-21 DIAGNOSIS — L853 Xerosis cutis: Secondary | ICD-10-CM | POA: Diagnosis not present

## 2017-03-21 DIAGNOSIS — D1801 Hemangioma of skin and subcutaneous tissue: Secondary | ICD-10-CM | POA: Diagnosis not present

## 2017-03-21 DIAGNOSIS — L821 Other seborrheic keratosis: Secondary | ICD-10-CM | POA: Diagnosis not present

## 2017-04-15 ENCOUNTER — Ambulatory Visit: Payer: PPO | Admitting: Physician Assistant

## 2017-04-15 ENCOUNTER — Encounter: Payer: Self-pay | Admitting: Physician Assistant

## 2017-04-15 VITALS — BP 120/68 | HR 74 | Temp 98.2°F | Ht 64.0 in | Wt 152.2 lb

## 2017-04-15 DIAGNOSIS — J0110 Acute frontal sinusitis, unspecified: Secondary | ICD-10-CM

## 2017-04-15 MED ORDER — FLUTICASONE PROPIONATE 50 MCG/ACT NA SUSP: 2.0000 | Freq: Every day | NASAL | 2 refills | Status: DC

## 2017-04-15 MED ORDER — AMOXICILLIN-POT CLAVULANATE 875-125 MG PO TABS: 1.0000 | ORAL_TABLET | Freq: Two times a day (BID) | ORAL | 0 refills | Status: DC

## 2017-04-15 NOTE — Patient Instructions (Signed)
It was great to see you!  Use medication as prescribed: Augmentin (antibiotic) and Flonase (nasal spray) Push fluids and get plenty of rest. Please return if you are not improving as expected, or if you have high fevers (>101.5) or difficulty swallowing or worsening productive cough.  Call clinic with questions.  I hope you start feeling better soon!

## 2017-04-15 NOTE — Progress Notes (Signed)
Crystal Carroll is a 72 y.o. female here for a new problem.  I acted as a Education administrator for [employer], PA-C Anselmo Pickler, LPN  History of Present Illness:   Chief Complaint  Patient presents with  . Sinus Problem    Sinus Problem  This is a new problem. Episode onset: Started on Saturday. The problem has been gradually worsening since onset. There has been no fever. Associated symptoms include congestion (Nasal yellow / green drainage, Chest congestion also), coughing, headaches, a hoarse voice, sinus pressure (around eyes) and a sore throat. Pertinent negatives include no ear pain, neck pain, shortness of breath, sneezing or swollen glands. Past treatments include acetaminophen. The treatment provided no relief.   Not a smoker, quit 3 months ago. No sick contacts. Appetite is diminished. Not drinking much water, enjoys Coke.  No recent travel.    Past Medical History:  Diagnosis Date  . Anxiety   . GERD (gastroesophageal reflux disease)   . Hyperlipidemia   . Hypertension      Social History   Socioeconomic History  . Marital status: Married    Spouse name: Not on file  . Number of children: 1  . Years of education: Not on file  . Highest education level: Not on file  Occupational History  . Not on file  Social Needs  . Financial resource strain: Not on file  . Food insecurity:    Worry: Not on file    Inability: Not on file  . Transportation needs:    Medical: Not on file    Non-medical: Not on file  Tobacco Use  . Smoking status: Former Smoker    Packs/day: 0.75    Years: 50.00    Pack years: 37.50    Types: Cigarettes    Last attempt to quit: 03/07/2013    Years since quitting: 4.1  . Smokeless tobacco: Never Used  Substance and Sexual Activity  . Alcohol use: Yes    Alcohol/week: 0.0 oz    Comment: 2 per day  . Drug use: No  . Sexual activity: Not on file  Lifestyle  . Physical activity:    Days per week: Not on file    Minutes per session: Not on  file  . Stress: Not on file  Relationships  . Social connections:    Talks on phone: Not on file    Gets together: Not on file    Attends religious service: Not on file    Active member of club or organization: Not on file    Attends meetings of clubs or organizations: Not on file    Relationship status: Not on file  . Intimate partner violence:    Fear of current or ex partner: Not on file    Emotionally abused: Not on file    Physically abused: Not on file    Forced sexual activity: Not on file  Other Topics Concern  . Not on file  Social History Narrative   Married to [facility] (patient) in 1981. 2nd marriage-1 child with 1 adopted grandchild.       Retired as Statistician.       Hobbies: antiques, former Firefighter      Exercise: none currently.           Past Surgical History:  Procedure Laterality Date  . CERVICAL LAMINECTOMY  2004   Earle Gell    Family History  Problem Relation Age of Onset  . Heart attack Father 17  smoker, rheumatic fever as child  . Melanoma Mother        labia    Allergies  Allergen Reactions  . Codeine Nausea Only  . Sulfamethoxazole     REACTION: GI upset    Current Medications:   Current Outpatient Medications:  .  ALPRAZolam (XANAX) 0.25 MG tablet, TAKE (1) TABLET DAILY AS NEEDED., Disp: 30 tablet, Rfl: 0 .  aspirin 81 MG tablet, Take 81 mg by mouth daily.  , Disp: , Rfl:  .  buPROPion (WELLBUTRIN XL) 300 MG 24 hr tablet, TAKE 1 TABLET ONCE DAILY., Disp: 90 tablet, Rfl: 1 .  citalopram (CELEXA) 20 MG tablet, TAKE 1 TABLET ONCE DAILY., Disp: 90 tablet, Rfl: 1 .  lisinopril (PRINIVIL,ZESTRIL) 10 MG tablet, Take 1 tablet (10 mg total) by mouth daily., Disp: 90 tablet, Rfl: 3 .  nystatin-triamcinolone (MYCOLOG II) cream, Apply 1 application topically 3 (three) times daily as needed. Reported on 04/18/2015, Disp: , Rfl:  .  pantoprazole (PROTONIX) 40 MG tablet, TAKE 1 TABLET ONCE DAILY., Disp: 90 tablet, Rfl:  0 .  rosuvastatin (CRESTOR) 40 MG tablet, TAKE 1 TABLET ONCE DAILY., Disp: 90 tablet, Rfl: 1 .  amoxicillin-clavulanate (AUGMENTIN) 875-125 MG tablet, Take 1 tablet by mouth 2 (two) times daily., Disp: 20 tablet, Rfl: 0 .  fluticasone (FLONASE) 50 MCG/ACT nasal spray, Place 2 sprays into both nostrils daily., Disp: 16 g, Rfl: 2   Review of Systems:   Review of Systems  HENT: Positive for congestion (Nasal yellow / green drainage, Chest congestion also), hoarse voice, sinus pressure (around eyes) and sore throat. Negative for ear pain and sneezing.   Respiratory: Positive for cough. Negative for shortness of breath.   Musculoskeletal: Negative for neck pain.  Neurological: Positive for headaches.    Vitals:   Vitals:   04/15/17 1258  BP: 120/68  Pulse: 74  Temp: 98.2 F (36.8 C)  TempSrc: Oral  SpO2: 97%  Weight: 152 lb 4 oz (69.1 kg)  Height: 5\' 4"  (1.626 m)     Body mass index is 26.13 kg/m.  Physical Exam:   Physical Exam  Constitutional: She appears well-developed. She is cooperative.  Non-toxic appearance. She does not have a sickly appearance. She does not appear ill. No distress.  HENT:  Head: Normocephalic and atraumatic.  Right Ear: Tympanic membrane, external ear and ear canal normal. Tympanic membrane is not erythematous, not retracted and not bulging.  Left Ear: Tympanic membrane, external ear and ear canal normal. Tympanic membrane is not erythematous, not retracted and not bulging.  Nose: Mucosal edema and rhinorrhea present. Right sinus exhibits maxillary sinus tenderness. Right sinus exhibits no frontal sinus tenderness. Left sinus exhibits maxillary sinus tenderness. Left sinus exhibits no frontal sinus tenderness.  Mouth/Throat: Uvula is midline and mucous membranes are normal. Posterior oropharyngeal erythema present. No posterior oropharyngeal edema. Tonsils are 1+ on the right. Tonsils are 1+ on the left.  Eyes: Conjunctivae and lids are normal.  Neck:  Trachea normal.  Cardiovascular: Normal rate, regular rhythm, S1 normal, S2 normal and normal heart sounds.  Pulmonary/Chest: Effort normal and breath sounds normal. She has no decreased breath sounds. She has no wheezes. She has no rhonchi. She has no rales.  Lymphadenopathy:    She has no cervical adenopathy.  Neurological: She is alert.  Skin: Skin is warm, dry and intact.  Psychiatric: She has a normal mood and affect. Her speech is normal and behavior is normal.  Nursing note and vitals reviewed.  Assessment and Plan:    Suzanne was seen today for sinus problem.  Diagnoses and all orders for this visit:  Acute non-recurrent frontal sinusitis  Other orders -     amoxicillin-clavulanate (AUGMENTIN) 875-125 MG tablet; Take 1 tablet by mouth 2 (two) times daily. -     fluticasone (FLONASE) 50 MCG/ACT nasal spray; Place 2 sprays into both nostrils daily.   No red flags on exam.  Will initiate Augmentin and Flonase per orders. Discussed taking medications as prescribed. Reviewed return precautions including worsening fever, SOB, worsening cough or other concerns. Push fluids and rest. I recommend that patient follow-up if symptoms worsen or persist despite treatment x 7-10 days, sooner if needed.   . Reviewed expectations re: course of current medical issues. . Discussed self-management of symptoms. . Outlined signs and symptoms indicating need for more acute intervention. . Patient verbalized understanding and all questions were answered. . See orders for this visit as documented in the electronic medical record. . Patient received an After-Visit Summary.  CMA or LPN served as scribe during this visit. History, Physical, and Plan performed by medical provider. Documentation and orders reviewed and attested to.  [name], PA-C

## 2017-05-12 ENCOUNTER — Ambulatory Visit (INDEPENDENT_AMBULATORY_CARE_PROVIDER_SITE_OTHER): Payer: PPO | Admitting: Family Medicine

## 2017-05-12 ENCOUNTER — Encounter: Payer: Self-pay | Admitting: Family Medicine

## 2017-05-12 VITALS — BP 118/68 | HR 60 | Temp 98.1°F | Ht 64.0 in | Wt 148.2 lb

## 2017-05-12 DIAGNOSIS — Z87891 Personal history of nicotine dependence: Secondary | ICD-10-CM | POA: Diagnosis not present

## 2017-05-12 DIAGNOSIS — Z79899 Other long term (current) drug therapy: Secondary | ICD-10-CM

## 2017-05-12 DIAGNOSIS — E785 Hyperlipidemia, unspecified: Secondary | ICD-10-CM

## 2017-05-12 DIAGNOSIS — Z0001 Encounter for general adult medical examination with abnormal findings: Secondary | ICD-10-CM

## 2017-05-12 DIAGNOSIS — R002 Palpitations: Secondary | ICD-10-CM | POA: Insufficient documentation

## 2017-05-12 DIAGNOSIS — F411 Generalized anxiety disorder: Secondary | ICD-10-CM

## 2017-05-12 DIAGNOSIS — E538 Deficiency of other specified B group vitamins: Secondary | ICD-10-CM | POA: Insufficient documentation

## 2017-05-12 DIAGNOSIS — K219 Gastro-esophageal reflux disease without esophagitis: Secondary | ICD-10-CM | POA: Diagnosis not present

## 2017-05-12 DIAGNOSIS — Z0000 Encounter for general adult medical examination without abnormal findings: Secondary | ICD-10-CM

## 2017-05-12 DIAGNOSIS — Z1231 Encounter for screening mammogram for malignant neoplasm of breast: Secondary | ICD-10-CM | POA: Diagnosis not present

## 2017-05-12 LAB — COMPREHENSIVE METABOLIC PANEL
ALT: 8 U/L (ref 0–35)
AST: 11 U/L (ref 0–37)
Albumin: 4.5 g/dL (ref 3.5–5.2)
Alkaline Phosphatase: 75 U/L (ref 39–117)
BILIRUBIN TOTAL: 0.6 mg/dL (ref 0.2–1.2)
BUN: 16 mg/dL (ref 6–23)
CALCIUM: 10.6 mg/dL — AB (ref 8.4–10.5)
CO2: 28 meq/L (ref 19–32)
CREATININE: 1.27 mg/dL — AB (ref 0.40–1.20)
Chloride: 105 mEq/L (ref 96–112)
GFR: 43.89 mL/min — ABNORMAL LOW (ref >60.00)
GLUCOSE: 100 mg/dL — AB (ref 70–99)
Potassium: 4.6 mEq/L (ref 3.5–5.1)
Sodium: 141 mEq/L (ref 135–145)
Total Protein: 7.2 g/dL (ref 6.0–8.3)

## 2017-05-12 LAB — TSH: TSH: 1.45 u[IU]/mL (ref 0.35–4.50)

## 2017-05-12 LAB — URINALYSIS, ROUTINE W REFLEX MICROSCOPIC
BILIRUBIN URINE: NEGATIVE
Ketones, ur: NEGATIVE
Nitrite: NEGATIVE
Specific Gravity, Urine: >=1.03 — AB (ref 1.000–1.030)
TOTAL PROTEIN, URINE-UPE24: 30 — AB
UROBILINOGEN UA: 0.2 (ref 0.0–1.0)
Urine Glucose: NEGATIVE
pH: 6 (ref 5.0–8.0)

## 2017-05-12 LAB — CBC
HCT: 39.2 % (ref 36.0–46.0)
Hemoglobin: 13.8 g/dL (ref 12.0–15.0)
MCHC: 35.2 g/dL (ref 30.0–36.0)
MCV: 86 fl (ref 78.0–100.0)
Platelets: 225 10*3/uL (ref 150.0–400.0)
RBC: 4.55 Mil/uL (ref 3.87–5.11)
RDW: 13.6 % (ref 11.5–15.5)
WBC: 6 10*3/uL (ref 4.0–10.5)

## 2017-05-12 LAB — LIPID PANEL
Cholesterol: 170 mg/dL (ref 0–200)
HDL: 65.7 mg/dL (ref >39.00)
LDL Cholesterol: 78 mg/dL (ref 0–99)
NonHDL: 103.86
Total CHOL/HDL Ratio: 3
Triglycerides: 131 mg/dL (ref 0.0–149.0)
VLDL: 26.2 mg/dL (ref 0.0–40.0)

## 2017-05-12 LAB — VITAMIN B12: Vitamin B-12: 183 pg/mL — ABNORMAL LOW (ref 211–911)

## 2017-05-12 NOTE — Assessment & Plan Note (Signed)
GERD-continues on Protonix- if missed dose for 3 days she gets flare up. Advised her to try every other day.  We will check a B12. Failed zantac years ago

## 2017-05-12 NOTE — Assessment & Plan Note (Signed)
Patient remains on Celexa 20 mg and Wellbutrin 300 mg for anxiety.  She will also has Xanax on hand for sparing breakthrough anxiety.  GAD-7 today of 11- a lot of this is caregiver burden related. She does not jump at the idea of counseling- has done this many times before and hasnt found super helpful

## 2017-05-12 NOTE — Assessment & Plan Note (Signed)
Hyperlipidemia unspecified- remains on Crestor 40 mg.  She is also on aspirin for primary prevention and we discussed she could potentially quit this- she opts for this

## 2017-05-12 NOTE — Assessment & Plan Note (Addendum)
S:About once or twice a week is experiencing palpitations in upper chest/ around lower neck. No chest pain, shortness of breath, lightheadedness, dizziness, no left arm or neck pain. Going on for at least a month. Does not feel poorly with these.  A/P: We will place her in a 7-day heart monitor.  Offered a 14-day monitor and she declines.  She would prefer to wear 7 days and palpitations do not occur to wear it again another 7 days.  We will also get a TSH and CBC and CMP

## 2017-05-12 NOTE — Progress Notes (Signed)
Are you having any signs of urinary tract infection such as burning with peeing or frequent urination?  If she is please let me know.  If you are not- team please refer her to urology under microscopic hematuria.  This is basically blood in the urine that is too small to see with the eye Your CBC was normal (blood counts, infection fighting cells, platelets). Your CMET was stable (kidney, liver, and electrolytes, blood sugar).  You have a baseline loss of some kidney function but this actually looks better than the last few checks.  Your blood sugar was a hair high suggesting increased risk of diabetes.  The main thing here is to cut out those Coca-Cola's.  Calcium just a hair high-we will monitor every 6 to 12 months Your cholesterol looks great Your B12 was low which can cause memory issues.  Please start her on vitamin B12 shots once a week for the next 4 weeks then monthly after that..  Please repeat a level in about 2 months under B12 deficiency Your thyroid was normal

## 2017-05-12 NOTE — Patient Instructions (Addendum)
Schedule your bone density test at check out desk. You may also call directly to X-ray at [phone] to schedule an appointment that is convenient for you.  - located [address] across the street from [place] - in the basement - you do need an appointment for the bone density tests.   Please stop by lab before you go   Take 800 to 1000 units of vitamin D per day. Also 1200mg  of calcium per day. You could do half through a pill and half through diet.   We will call you within a week or two about your referral to lung cancer screening and heart monitor. If you do not hear within 3 weeks, give [place] a call.

## 2017-05-12 NOTE — Progress Notes (Signed)
Phone: [phone]  Subjective:  Patient presents today for their annual physical. Chief complaint-noted.   See problem oriented charting- ROS- full  review of systems was completed and negative except for: palpitations, increased thirst, increased eating, off balance at times, hyperactive, nervous  The following were reviewed and entered/updated in epic: Past Medical History:  Diagnosis Date  . Anxiety   . GERD (gastroesophageal reflux disease)   . Hyperlipidemia   . Hypertension    Patient Active Problem List   Diagnosis Date Noted  . Cervical radiculopathy 04/03/2016    Priority: Medium  . Essential tremor 04/18/2015    Priority: Medium  . Heavy alcohol use 04/14/2014    Priority: Medium  . CKD (chronic kidney disease), stage III ([place]) 10/15/2013    Priority: Medium  . Hypercalcemia 01/28/2007    Priority: Medium  . TOBACCO USE 12/24/2006    Priority: Medium  . Hyperlipidemia 05/26/2006    Priority: Medium  . Anxiety state 05/26/2006    Priority: Medium  . Essential hypertension 05/26/2006    Priority: Medium  . Adenomatous colon polyp 06/14/2014    Priority: Low  . H/O cold sores 10/15/2013    Priority: Low  . GERD 05/26/2006    Priority: Low  . Palpitations 05/12/2017  . Caregiver burden 04/03/2016  . Diarrhea 04/14/2014   Past Surgical History:  Procedure Laterality Date  . CERVICAL LAMINECTOMY  2004   Earle Gell    Family History  Problem Relation Age of Onset  . Heart attack Father 46       smoker, rheumatic fever as child  . Melanoma Mother        labia    Medications- reviewed and updated Current Outpatient Medications  Medication Sig Dispense Refill  . ALPRAZolam (XANAX) 0.25 MG tablet TAKE (1) TABLET DAILY AS NEEDED. 30 tablet 0  . buPROPion (WELLBUTRIN XL) 300 MG 24 hr tablet TAKE 1 TABLET ONCE DAILY. 90 tablet 1  . citalopram (CELEXA) 20 MG tablet TAKE 1 TABLET ONCE DAILY. 90 tablet 1  . lisinopril (PRINIVIL,ZESTRIL) 10 MG tablet  Take 1 tablet (10 mg total) by mouth daily. 90 tablet 3  . nystatin-triamcinolone (MYCOLOG II) cream Apply 1 application topically 3 (three) times daily as needed. Reported on 04/18/2015    . pantoprazole (PROTONIX) 40 MG tablet TAKE 1 TABLET ONCE DAILY. 90 tablet 0  . rosuvastatin (CRESTOR) 40 MG tablet TAKE 1 TABLET ONCE DAILY. 90 tablet 1   No current facility-administered medications for this visit.     Allergies-reviewed and updated Allergies  Allergen Reactions  . Codeine Nausea Only  . Sulfamethoxazole     REACTION: GI upset    Social History   Social History Narrative   Married to [employer] (patient) in 1981. 2nd marriage-1 child with 1 adopted grandchild.       Retired as Statistician.       Hobbies: antiques, former Firefighter      Exercise: none currently.           Objective: BP 118/68 (BP Location: Left Arm, Patient Position: Sitting, Cuff Size: Normal)   Pulse 60   Temp 98.1 F (36.7 C) (Oral)   Ht 5\' 4"  (1.626 m)   Wt 148 lb 3.2 oz (67.2 kg)   SpO2 97%   BMI 25.44 kg/m  Gen: NAD, resting comfortably HEENT: Mucous membranes are moist. Oropharynx normal Neck: no thyromegaly CV: RRR no murmurs rubs or gallops Lungs: CTAB no crackles, wheeze, rhonchi Abdomen:  soft/nontender/nondistended/normal bowel sounds. No rebound or guarding.  Ext: no edema Skin: warm, dry Neuro: grossly normal, moves all extremities, PERRLA  Assessment/Plan:  72 y.o. female presenting for annual physical.  Health Maintenance counseling: 1. Anticipatory guidance: Patient counseled regarding regular dental exams -q6 months, eye exams - yearly, wearing seatbelts.  2. Risk factor reduction:  Advised patient of need for regular exercise and diet rich and fruits and vegetables to reduce risk of heart attack and stroke. Exercise- 0 right now- advised her the importance of this with target 150 minutes a week. Diet-drinks too many cokes- strongly advised her to cut down or  out.  Wt Readings from Last 3 Encounters:  05/12/17 148 lb 3.2 oz (67.2 kg)  04/15/17 152 lb 4 oz (69.1 kg)  02/12/17 149 lb 6.4 oz (67.8 kg)  3. Immunizations/screenings/ancillary studies-discussed option of Shingrix at her pharmacy.  This has also been discussed at annual wellness visit Immunization History  Administered Date(s) Administered  . Influenza Split 12/03/2010, 12/09/2011  . Influenza Whole 10/18/2009  . Influenza, High Dose Seasonal PF 11/02/2012, 10/18/2015, 10/21/2016  . Influenza,inj,Quad PF,6+ Mos 10/15/2013, 12/06/2014  . Pneumococcal Conjugate-13 04/15/2014  . Pneumococcal Polysaccharide-23 12/03/2010  . Td 01/07/1998, 10/18/2009  . Tdap 10/18/2016  . Zoster 10/18/2009  4. Cervical cancer screening- passed age based screening- still sees gynecology.  Denies history of abnormal Pap smear.  Denies vaginal bleeding 5. Breast cancer screening-  breast exam with gynecology and mammogram 05/17/2016 reassuring- and has updated mammogram planned 6. Colon cancer screening - 05/31/2014 with 5-year repeat planned due to adenomatous polyp history 7. Skin cancer screening-has a dermatologist and has seen within last year for hair loss-Dr. Fontaine No. advised regular sunscreen use. Denies worrisome, changing, or new skin lesions.  8. Birth control/STD check- monogamous and postmenopausal 9. Osteoporosis screening at 11- 05/16/2014 with worst T score at -2.2.  Patient was to take calcium and vitamin D- she is not taking this.  At her annual wellness visit another bone density was ordered.  She needs to schedule this and I gave her information today on that.  Suspect she will need Fosamaxj 10.  Former smoker-over 30 pack years.  Quit in 2015.  We discussed lung cancer screening program- she opts in .  Will get urine as well  Status of chronic or acute concerns   Increased eating due to stress. Also stress drinking cokes- advised against.   Chronic kidney disease stage III- GFR 30-40  range after bout of diarrhea in 2016.  She knows to avoid NSAIDs.  We will update bmet/cmet  Hypertension controlled on lisinopril 10 mg.  We have considered reducing to 5 mg if BP drops any lower  Alcohol use-has cut down to 1 wine spritzer per night.  This is a huge improvement for her  Anxiety state Patient remains on Celexa 20 mg and Wellbutrin 300 mg for anxiety.  She will also has Xanax on hand for sparing breakthrough anxiety.  GAD-7 today of 11- a lot of this is caregiver burden related. She does not jump at the idea of counseling- has done this many times before and hasnt found super helpful   Hyperlipidemia Hyperlipidemia unspecified- remains on Crestor 40 mg.  She is also on aspirin for primary prevention and we discussed she could potentially quit this- she opts for this   GERD GERD-continues on Protonix- if missed dose for 3 days she gets flare up. Advised her to try every other day.  We will check a B12.  Failed zantac years ago  Palpitations S:About once or twice a week is experiencing palpitations in upper chest/ around lower neck. No chest pain, shortness of breath, lightheadedness, dizziness, no left arm or neck pain. Going on for at least a month. Does not feel poorly with these.  A/P: We will place her in a 7-day heart monitor.  Offered a 14-day monitor and she declines.  She would prefer to wear 7 days and palpitations do not occur to wear it again another 7 days.  We will also get a TSH and CBC and CMP  Return in about 3 months (around 08/12/2017) for follow up- or sooner if needed.  Lab/Order associations: Hyperlipidemia, unspecified hyperlipidemia type - Plan: CBC, Comprehensive metabolic panel, Lipid panel, Urinalysis, TSH  High risk medication use - Plan: Vitamin B12  Anxiety state - Plan: TSH  Former smoker - Plan: Ambulatory Referral for Lung Cancer Scre  Palpitations - Plan: Cardiac event monitor  Return precautions advised.  [name], MD

## 2017-05-13 ENCOUNTER — Other Ambulatory Visit: Payer: Self-pay

## 2017-05-13 ENCOUNTER — Telehealth: Payer: Self-pay

## 2017-05-13 DIAGNOSIS — R3129 Other microscopic hematuria: Secondary | ICD-10-CM

## 2017-05-13 DIAGNOSIS — E538 Deficiency of other specified B group vitamins: Secondary | ICD-10-CM

## 2017-05-13 NOTE — Telephone Encounter (Signed)
Called patient and read her lab results to her. Patient stated "I don't have time for this!" in a joking matter. I have placed the referral for urology for her. I also have scheduled her for her first nurse visit for the B12 shot. She will call weekly to schedule the future visits. Patient verbalized understanding.

## 2017-05-14 ENCOUNTER — Ambulatory Visit: Payer: PPO

## 2017-05-16 ENCOUNTER — Other Ambulatory Visit: Payer: Self-pay | Admitting: Acute Care

## 2017-05-16 DIAGNOSIS — Z122 Encounter for screening for malignant neoplasm of respiratory organs: Secondary | ICD-10-CM

## 2017-05-16 DIAGNOSIS — Z87891 Personal history of nicotine dependence: Secondary | ICD-10-CM

## 2017-05-19 ENCOUNTER — Ambulatory Visit (INDEPENDENT_AMBULATORY_CARE_PROVIDER_SITE_OTHER)
Admission: RE | Admit: 2017-05-19 | Discharge: 2017-05-19 | Disposition: A | Discharge status: Home / Self Care | Payer: PPO | Source: Ambulatory Visit | Attending: Family Medicine | Admitting: Family Medicine

## 2017-05-19 DIAGNOSIS — Z780 Asymptomatic menopausal state: Secondary | ICD-10-CM | POA: Diagnosis not present

## 2017-05-22 ENCOUNTER — Encounter: Payer: Self-pay | Admitting: Family Medicine

## 2017-05-23 ENCOUNTER — Ambulatory Visit (INDEPENDENT_AMBULATORY_CARE_PROVIDER_SITE_OTHER): Payer: PPO

## 2017-05-23 DIAGNOSIS — E538 Deficiency of other specified B group vitamins: Secondary | ICD-10-CM | POA: Diagnosis not present

## 2017-05-23 MED ORDER — CYANOCOBALAMIN 1000 MCG/ML IJ SOLN: 1000.0000 ug | Freq: Once | INTRAMUSCULAR | Status: DC

## 2017-05-23 NOTE — Progress Notes (Signed)
Per orders of Dr. Yong Channel, injection of B12 given by Francella Solian. Patient tolerated injection well. Injection given in left arm.

## 2017-05-26 ENCOUNTER — Telehealth: Payer: Self-pay | Admitting: Acute Care

## 2017-05-26 NOTE — Telephone Encounter (Signed)
Spoke with pt and rescheduled [facility] 06/06/17 2:30.  Will reschedule CT.  Pt verbalized understanding.  Nothing further needed.

## 2017-05-29 ENCOUNTER — Other Ambulatory Visit: Payer: Self-pay

## 2017-05-29 ENCOUNTER — Ambulatory Visit (INDEPENDENT_AMBULATORY_CARE_PROVIDER_SITE_OTHER): Payer: PPO

## 2017-05-29 DIAGNOSIS — E538 Deficiency of other specified B group vitamins: Secondary | ICD-10-CM | POA: Diagnosis not present

## 2017-05-29 MED ORDER — CYANOCOBALAMIN 1000 MCG/ML IJ SOLN
1000.0000 ug | Freq: Once | INTRAMUSCULAR | Status: AC
  Administered 2017-05-29: 1000 ug via INTRAMUSCULAR

## 2017-05-29 MED ORDER — ALENDRONATE SODIUM 70 MG PO TABS: 70.0000 mg | ORAL_TABLET | ORAL | 11 refills | Status: DC

## 2017-05-29 NOTE — Progress Notes (Signed)
Patient was given B12 IM Injection in the right deltoid, tolerated well. Patient will return next week for another injection.

## 2017-05-30 ENCOUNTER — Other Ambulatory Visit: Payer: Self-pay | Admitting: Family Medicine

## 2017-05-30 ENCOUNTER — Encounter: Payer: PPO | Admitting: Acute Care

## 2017-05-30 ENCOUNTER — Ambulatory Visit: Payer: PPO

## 2017-06-05 ENCOUNTER — Ambulatory Visit (INDEPENDENT_AMBULATORY_CARE_PROVIDER_SITE_OTHER): Payer: PPO

## 2017-06-05 DIAGNOSIS — E538 Deficiency of other specified B group vitamins: Secondary | ICD-10-CM | POA: Diagnosis not present

## 2017-06-05 MED ORDER — CYANOCOBALAMIN 1000 MCG/ML IJ SOLN
1000.0000 ug | Freq: Once | INTRAMUSCULAR | Status: AC
  Administered 2017-06-05: 1000 ug via INTRAMUSCULAR

## 2017-06-05 NOTE — Progress Notes (Signed)
Per orders of Dr.Hunter, injection of B-12 given by Francella Solian. Patient tolerated injection well. Given in left arm.

## 2017-06-06 ENCOUNTER — Encounter: Payer: Self-pay | Admitting: Acute Care

## 2017-06-06 ENCOUNTER — Ambulatory Visit (INDEPENDENT_AMBULATORY_CARE_PROVIDER_SITE_OTHER): Payer: PPO | Admitting: Acute Care

## 2017-06-06 ENCOUNTER — Ambulatory Visit (INDEPENDENT_AMBULATORY_CARE_PROVIDER_SITE_OTHER)
Admission: RE | Admit: 2017-06-06 | Discharge: 2017-06-06 | Disposition: A | Discharge status: Home / Self Care | Payer: PPO | Source: Ambulatory Visit | Attending: Acute Care | Admitting: Acute Care

## 2017-06-06 DIAGNOSIS — Z87891 Personal history of nicotine dependence: Secondary | ICD-10-CM | POA: Diagnosis not present

## 2017-06-06 DIAGNOSIS — Z122 Encounter for screening for malignant neoplasm of respiratory organs: Secondary | ICD-10-CM

## 2017-06-06 NOTE — Progress Notes (Signed)
Shared Decision Making Visit Lung Cancer Screening Program [phone])   Eligibility:  Age 72 y.o.  Pack Years Smoking History Calculation 54 pack year smoking history (# packs/per year x # years smoked)  Recent History of coughing up blood  no  Unexplained weight loss? no ( >Than 15 pounds within the last 6 months )  Prior History Lung / other cancer no (Diagnosis within the last 5 years already requiring surveillance chest CT Scans).  Smoking Status Former Smoker  Former Smokers: Years since quit: 3 years  Quit Date: 2016  Visit Components:  Discussion included one or more decision making aids. yes  Discussion included risk/benefits of screening. yes  Discussion included potential follow up diagnostic testing for abnormal scans. yes  Discussion included meaning and risk of over diagnosis. yes  Discussion included meaning and risk of False Positives. yes  Discussion included meaning of total radiation exposure. yes  Counseling Included:  Importance of adherence to annual lung cancer LDCT screening. yes  Impact of comorbidities on ability to participate in the program. yes  Ability and willingness to under diagnostic treatment. yes  Smoking Cessation Counseling:  Current Smokers:   Discussed importance of smoking cessation. yes  Information about tobacco cessation classes and interventions provided to patient. yes  Patient provided with "ticket" for LDCT Scan. yes  Symptomatic Patient. no  Counseling  Diagnosis Code: Tobacco Use Z72.0  Asymptomatic Patient yes  Counseling (Intermediate counseling: > three minutes counseling) V7846  Former Smokers:   Discussed the importance of maintaining cigarette abstinence. yes  Diagnosis Code: Personal History of Nicotine Dependence. N62.952  Information about tobacco cessation classes and interventions provided to patient. Yes  Patient provided with "ticket" for LDCT Scan. yes  Written Order for Lung Cancer  Screening with LDCT placed in Epic. Yes (CT Chest Lung Cancer Screening Low Dose W/O CM) [id number] Z12.2-Screening of respiratory organs Z87.891-Personal history of nicotine dependence  I spent 25 minutes of face to face time with Ms. Selbe discussing the risks and benefits of lung cancer screening. We viewed a power point together that explained in detail the above noted topics. We took the time to pause the power point at intervals to allow for questions to be asked and answered to ensure understanding. We discussed that she had taken the single most powerful action possible to decrease her risk of developing lung cancer when she quit smoking. I counseled her to remain smoke free, and to contact me if she ever had the desire to smoke again so that I can provide resources and tools to help support the effort to remain smoke free. We discussed the time and location of the scan, and that either  Doroteo Glassman RN or I will call with the results within  24-48 hours of receiving them. She  has my card and contact information in the event she needs to speak with me, in addition to a copy of the power point we reviewed as a resource. She verbalized understanding of all of the above and had no further questions upon leaving the office.     I explained to the patient that there has been a high incidence of coronary artery disease noted on these exams. I explained that this is a non-gated exam therefore degree or severity cannot be determined. This patient is currently on statin therapy. I have asked the patient to follow-up with their PCP regarding any incidental finding of coronary artery disease and management with diet or medication as they feel is  clinically indicated. The patient verbalized understanding of the above and had no further questions.     Magdalen Spatz, NP  06/06/2017 3:10 PM

## 2017-06-10 ENCOUNTER — Encounter: Payer: Self-pay | Admitting: Family Medicine

## 2017-06-11 ENCOUNTER — Telehealth: Payer: Self-pay | Admitting: Acute Care

## 2017-06-11 DIAGNOSIS — Z122 Encounter for screening for malignant neoplasm of respiratory organs: Secondary | ICD-10-CM

## 2017-06-11 DIAGNOSIS — Z87891 Personal history of nicotine dependence: Secondary | ICD-10-CM

## 2017-06-11 NOTE — Telephone Encounter (Signed)
Pt informed of CT results per Sarah Groce, NP.  PT verbalized understanding.  Copy sent to PCP.  Order placed for 1 yr f/u CT.  

## 2017-06-12 ENCOUNTER — Ambulatory Visit (INDEPENDENT_AMBULATORY_CARE_PROVIDER_SITE_OTHER): Payer: PPO

## 2017-06-12 ENCOUNTER — Encounter: Payer: Self-pay | Admitting: Family Medicine

## 2017-06-12 DIAGNOSIS — I700 Atherosclerosis of aorta: Secondary | ICD-10-CM | POA: Insufficient documentation

## 2017-06-12 DIAGNOSIS — E538 Deficiency of other specified B group vitamins: Secondary | ICD-10-CM | POA: Diagnosis not present

## 2017-06-12 MED ORDER — CYANOCOBALAMIN 1000 MCG/ML IJ SOLN
1000.0000 ug | Freq: Once | INTRAMUSCULAR | Status: AC
  Administered 2017-06-12: 1000 ug via INTRAMUSCULAR

## 2017-06-12 NOTE — Progress Notes (Signed)
Per orders of Dr. Retail banker, injection of b12 given by Francella Solian. Patient tolerated injection well. Given in right deltoid

## 2017-06-26 DIAGNOSIS — L309 Dermatitis, unspecified: Secondary | ICD-10-CM | POA: Diagnosis not present

## 2017-06-26 DIAGNOSIS — L821 Other seborrheic keratosis: Secondary | ICD-10-CM | POA: Diagnosis not present

## 2017-06-26 DIAGNOSIS — L430 Hypertrophic lichen planus: Secondary | ICD-10-CM | POA: Diagnosis not present

## 2017-06-26 DIAGNOSIS — D485 Neoplasm of uncertain behavior of skin: Secondary | ICD-10-CM | POA: Diagnosis not present

## 2017-07-03 ENCOUNTER — Other Ambulatory Visit: Payer: Self-pay

## 2017-07-03 ENCOUNTER — Encounter: Payer: Self-pay | Admitting: Family Medicine

## 2017-07-03 DIAGNOSIS — R002 Palpitations: Secondary | ICD-10-CM

## 2017-07-14 ENCOUNTER — Ambulatory Visit (INDEPENDENT_AMBULATORY_CARE_PROVIDER_SITE_OTHER): Payer: PPO

## 2017-07-14 DIAGNOSIS — E538 Deficiency of other specified B group vitamins: Secondary | ICD-10-CM

## 2017-07-14 MED ORDER — CYANOCOBALAMIN 1000 MCG/ML IJ SOLN
1000.0000 ug | Freq: Once | INTRAMUSCULAR | Status: AC
  Administered 2017-07-14: 1000 ug via INTRAMUSCULAR

## 2017-07-14 NOTE — Progress Notes (Signed)
Patient here for B12 Injection. Administered in her left deltoid. No reaction to the injection was noted.

## 2017-07-16 ENCOUNTER — Ambulatory Visit (INDEPENDENT_AMBULATORY_CARE_PROVIDER_SITE_OTHER): Payer: PPO

## 2017-07-16 DIAGNOSIS — R002 Palpitations: Secondary | ICD-10-CM | POA: Diagnosis not present

## 2017-07-22 DIAGNOSIS — R002 Palpitations: Secondary | ICD-10-CM | POA: Diagnosis not present

## 2017-08-12 ENCOUNTER — Encounter: Payer: Self-pay | Admitting: Family Medicine

## 2017-08-12 ENCOUNTER — Ambulatory Visit (INDEPENDENT_AMBULATORY_CARE_PROVIDER_SITE_OTHER): Payer: PPO | Admitting: Family Medicine

## 2017-08-12 VITALS — BP 102/62 | HR 66 | Temp 98.2°F | Ht 64.0 in | Wt 151.2 lb

## 2017-08-12 DIAGNOSIS — F411 Generalized anxiety disorder: Secondary | ICD-10-CM

## 2017-08-12 DIAGNOSIS — K219 Gastro-esophageal reflux disease without esophagitis: Secondary | ICD-10-CM | POA: Diagnosis not present

## 2017-08-12 DIAGNOSIS — E538 Deficiency of other specified B group vitamins: Secondary | ICD-10-CM | POA: Diagnosis not present

## 2017-08-12 DIAGNOSIS — I10 Essential (primary) hypertension: Secondary | ICD-10-CM

## 2017-08-12 DIAGNOSIS — E785 Hyperlipidemia, unspecified: Secondary | ICD-10-CM | POA: Diagnosis not present

## 2017-08-12 NOTE — Progress Notes (Signed)
Subjective:  Crystal Carroll is a 72 y.o. year old very pleasant female patient who presents for/with See problem oriented charting ROS- continued stress/anxiety. Denies chest pain. No edema.    Past Medical History-  Patient Active Problem List   Diagnosis Date Noted  . Vitamin B12 deficiency 05/12/2017    Priority: Medium  . Cervical radiculopathy 04/03/2016    Priority: Medium  . Essential tremor 04/18/2015    Priority: Medium  . Heavy alcohol use 04/14/2014    Priority: Medium  . CKD (chronic kidney disease), stage III ([place]) 10/15/2013    Priority: Medium  . Hypercalcemia 01/28/2007    Priority: Medium  . TOBACCO USE 12/24/2006    Priority: Medium  . Hyperlipidemia 05/26/2006    Priority: Medium  . Anxiety state 05/26/2006    Priority: Medium  . Essential hypertension 05/26/2006    Priority: Medium  . Adenomatous colon polyp 06/14/2014    Priority: Low  . H/O cold sores 10/15/2013    Priority: Low  . GERD 05/26/2006    Priority: Low  . Aortic atherosclerosis ([place]) 06/12/2017  . Palpitations 05/12/2017  . Caregiver burden 04/03/2016  . Diarrhea 04/14/2014    Medications- reviewed and updated Current Outpatient Medications  Medication Sig Dispense Refill  . amoxicillin (AMOXIL) 500 MG capsule Take 500 mg by mouth 2 (two) times daily.    Marland Kitchen alendronate (FOSAMAX) 70 MG tablet Take 1 tablet (70 mg total) by mouth once a week. Take with a full glass of water on an empty stomach. 5 tablet 11  . ALPRAZolam (XANAX) 0.25 MG tablet TAKE (1) TABLET DAILY AS NEEDED. 30 tablet 0  . buPROPion (WELLBUTRIN XL) 300 MG 24 hr tablet TAKE 1 TABLET ONCE DAILY. 90 tablet 1  . citalopram (CELEXA) 20 MG tablet TAKE 1 TABLET ONCE DAILY. 90 tablet 1  . lisinopril (PRINIVIL,ZESTRIL) 10 MG tablet Take 1 tablet (10 mg total) by mouth daily. 90 tablet 3  . nystatin-triamcinolone (MYCOLOG II) cream Apply 1 application topically 3 (three) times daily as needed. Reported on 04/18/2015    .  pantoprazole (PROTONIX) 40 MG tablet TAKE 1 TABLET ONCE DAILY. 90 tablet 1  . rosuvastatin (CRESTOR) 40 MG tablet TAKE 1 TABLET ONCE DAILY. 90 tablet 1   No current facility-administered medications for this visit.     Objective: BP 102/62 (BP Location: Left Arm, Patient Position: Sitting, Cuff Size: Normal)   Pulse 66   Temp 98.2 F (36.8 C) (Oral)   Ht 5\' 4"  (1.626 m)   Wt 151 lb 3.2 oz (68.6 kg)   SpO2 95%   BMI 25.95 kg/m  Gen: NAD, resting comfortably CV: RRR no murmurs rubs or gallops Lungs: CTAB no crackles, wheeze, rhonchi Abdomen: soft/nontender/nondistended/normal bowel sounds. overweight Ext: no edema Skin: warm, dry  Assessment/Plan:  Other notes: 1.had one episode of dizziness lasting a few minutes or less then resolved- she is not sure  What caused it.  2. Continues to have palpitations when she had one while had monitor on it was found to be NSR- we discussed non cardiac palpitations  Essential hypertension S: controlled on lisinopril 10mg  alone BP Readings from Last 3 Encounters:  08/12/17 102/62  05/12/17 118/68  04/15/17 120/68  A/P: We discussed blood pressure goal of <140/90. Continue current meds:  If persists this low could reduce dose  Anxiety state S: PHQ2 remains at a 0 today on wellbutrin 300 mg and celexa 20mg . She states reasonable control of anxiety A/P: continue current rx  Hyperlipidemia S:  controlled on crestor 40mg  with last LDL at 78  A/P: continue current rx  GERD S:  doing extremely well on protonix 40mg . If she misses 2 days starts to get reflux A/P: we discussed reducing dose - she wants to hold off for now  Vitamin B12 deficiency S: did b12 shots weekly for a month and now is on once a month. She feels memory is sharper Lab Results  Component Value Date   VITAMINB12 412 08/12/2017  A/P: b12 is normal but it did not rapidly increase- lets continue monthly injections. She can keep her visit this week. She feels this has  helped her memory as above   Future Appointments  Date Time Provider [place]  08/14/2017  2:00 PM [facility] NURSE [facility]  11/13/2017  2:15 PM Marin Olp, MD [facility]   3 months per her preference  Lab/Order associations: B12 deficiency - Plan: Vitamin B12  Essential hypertension  Anxiety state  Hyperlipidemia, unspecified hyperlipidemia type  Gastroesophageal reflux disease without esophagitis  Vitamin B12 deficiency  Return precautions advised.  [name], MD

## 2017-08-12 NOTE — Patient Instructions (Addendum)
Health Maintenance Due  Topic Date Due  . INFLUENZA VACCINE - Will await until October 08/07/2017  . I would also like for you to sign up for an annual wellness visit with one of our nurses, Cassie or Manuela Schwartz, who both specialize in the annual wellness visit. This is a free benefit under medicare that may help [place] find additional ways to help you. Some highlights are reviewing medications, lifestyle, and doing a dementia screen.   . Please check with your pharmacy to see if they have the shingrix vaccine. If they do- please get this immunization and update [place] by phone call or mychart with dates you receive the vaccine    Please stop by lab before you go  If B12 looks really good- I will likely change you to cyanocobalamin/vitamin B12 1000 mcg by mouth daily and we can recheck at next visit  See me back in 3-6 months

## 2017-08-13 LAB — VITAMIN B12: Vitamin B-12: 412 pg/mL (ref 211–911)

## 2017-08-13 NOTE — Assessment & Plan Note (Signed)
S:  controlled on crestor 40mg  with last LDL at 78  A/P: continue current rx

## 2017-08-13 NOTE — Assessment & Plan Note (Signed)
S: did b12 shots weekly for a month and now is on once a month. She feels memory is sharper Lab Results  Component Value Date   VITAMINB12 412 08/12/2017  A/P: b12 is normal but it did not rapidly increase- lets continue monthly injections. She can keep her visit this week. She feels this has helped her memory as above

## 2017-08-13 NOTE — Assessment & Plan Note (Signed)
S: controlled on lisinopril 10mg  alone BP Readings from Last 3 Encounters:  08/12/17 102/62  05/12/17 118/68  04/15/17 120/68  A/P: We discussed blood pressure goal of <140/90. Continue current meds:  If persists this low could reduce dose

## 2017-08-13 NOTE — Assessment & Plan Note (Signed)
S: PHQ2 remains at a 0 today on wellbutrin 300 mg and celexa 20mg . She states reasonable control of anxiety A/P: continue current rx

## 2017-08-13 NOTE — Assessment & Plan Note (Signed)
S:  doing extremely well on protonix 40mg . If she misses 2 days starts to get reflux A/P: we discussed reducing dose - she wants to hold off for now

## 2017-08-14 ENCOUNTER — Ambulatory Visit (INDEPENDENT_AMBULATORY_CARE_PROVIDER_SITE_OTHER): Payer: PPO

## 2017-08-14 DIAGNOSIS — E538 Deficiency of other specified B group vitamins: Secondary | ICD-10-CM | POA: Diagnosis not present

## 2017-08-14 MED ORDER — CYANOCOBALAMIN 1000 MCG/ML IJ SOLN
1000.0000 ug | Freq: Once | INTRAMUSCULAR | Status: AC
Start: 2017-08-14 — End: 2017-08-14
  Administered 2017-08-14: 1000 ug via INTRAMUSCULAR

## 2017-08-14 NOTE — Progress Notes (Signed)
Per orders of Dr.Hunter, injection of B-12 given by Francella Solian. Patient tolerated injection well. Injection given in right deltoid. Will make next appointment at check out.

## 2017-08-21 ENCOUNTER — Other Ambulatory Visit: Payer: Self-pay | Admitting: Family Medicine

## 2017-09-09 ENCOUNTER — Other Ambulatory Visit: Payer: Self-pay | Admitting: Family Medicine

## 2017-09-17 ENCOUNTER — Ambulatory Visit (INDEPENDENT_AMBULATORY_CARE_PROVIDER_SITE_OTHER): Payer: PPO

## 2017-09-17 DIAGNOSIS — E538 Deficiency of other specified B group vitamins: Secondary | ICD-10-CM | POA: Diagnosis not present

## 2017-09-17 MED ORDER — CYANOCOBALAMIN 1000 MCG/ML IJ SOLN
1000.0000 ug | Freq: Once | INTRAMUSCULAR | Status: AC
Start: 2017-09-17 — End: 2017-09-17
  Administered 2017-09-17: 1000 ug via INTRAMUSCULAR

## 2017-09-17 NOTE — Progress Notes (Signed)
Per orders of Dr. Yong Channel , injection of b-12 given by Francella Solian. Patient tolerated injection well. Patient given injection in left deltoid with no problems. Will make next appointment at check out.

## 2017-09-19 ENCOUNTER — Other Ambulatory Visit: Payer: Self-pay | Admitting: Family Medicine

## 2017-10-21 ENCOUNTER — Ambulatory Visit (INDEPENDENT_AMBULATORY_CARE_PROVIDER_SITE_OTHER): Payer: PPO

## 2017-10-21 DIAGNOSIS — E538 Deficiency of other specified B group vitamins: Secondary | ICD-10-CM | POA: Diagnosis not present

## 2017-10-21 DIAGNOSIS — Z23 Encounter for immunization: Secondary | ICD-10-CM

## 2017-10-21 MED ORDER — CYANOCOBALAMIN 1000 MCG/ML IJ SOLN
1000.0000 ug | Freq: Once | INTRAMUSCULAR | Status: AC
  Administered 2017-10-21: 1000 ug via INTRAMUSCULAR

## 2017-10-21 NOTE — Progress Notes (Signed)
Per orders of Dr. Yong Channel, injection of Vitamin B12  1000 mcg given right deltoid IM by Kevan Ny, CMA Patient tolerated injection well.  Will make appt for 1 month to return for next injection.

## 2017-11-11 ENCOUNTER — Ambulatory Visit (INDEPENDENT_AMBULATORY_CARE_PROVIDER_SITE_OTHER): Payer: PPO | Admitting: Family Medicine

## 2017-11-11 ENCOUNTER — Ambulatory Visit (INDEPENDENT_AMBULATORY_CARE_PROVIDER_SITE_OTHER): Payer: PPO

## 2017-11-11 ENCOUNTER — Telehealth: Payer: Self-pay | Admitting: *Deleted

## 2017-11-11 ENCOUNTER — Encounter: Payer: Self-pay | Admitting: Family Medicine

## 2017-11-11 VITALS — BP 118/64 | HR 74 | Temp 97.6°F | Ht 64.0 in | Wt 154.6 lb

## 2017-11-11 DIAGNOSIS — I700 Atherosclerosis of aorta: Secondary | ICD-10-CM | POA: Diagnosis not present

## 2017-11-11 DIAGNOSIS — M79621 Pain in right upper arm: Secondary | ICD-10-CM

## 2017-11-11 DIAGNOSIS — R0781 Pleurodynia: Secondary | ICD-10-CM

## 2017-11-11 DIAGNOSIS — S4991XA Unspecified injury of right shoulder and upper arm, initial encounter: Secondary | ICD-10-CM

## 2017-11-11 DIAGNOSIS — W19XXXA Unspecified fall, initial encounter: Secondary | ICD-10-CM | POA: Diagnosis not present

## 2017-11-11 DIAGNOSIS — S299XXA Unspecified injury of thorax, initial encounter: Secondary | ICD-10-CM | POA: Diagnosis not present

## 2017-11-11 NOTE — Assessment & Plan Note (Signed)
S:Noted CT 06/2017 for lung cancer screening A/P: suspect stable. Sie pain related to fall and strongly doubt something like progression to aneurysm/rupture.   continue risk factor reduction with statin and BP control

## 2017-11-11 NOTE — Telephone Encounter (Signed)
Spoke to pt's husband told him left message on mobile wanted to know if pt would like to see Dr. Yong Channel instead he has openings this afternoon. Mr Mix said yes that would be great. Told him okay appt scheduled for 3:00 pm with Dr. Yong Channel. Mr Flynn verbalized understanding and will let pt know.

## 2017-11-11 NOTE — Progress Notes (Signed)
Subjective:  Crystal Carroll is a 72 y.o. year old very pleasant female patient who presents for/with See problem oriented charting ROS-patient with right chest wall pain where she fell.  Patient also has right upper arm pain.  Pain with lifting arm overhead.  No fever or chills.  No coughing.  No shortness of breath reported.  Past Medical History-  Patient Active Problem List   Diagnosis Date Noted  . Vitamin B12 deficiency 05/12/2017    Priority: Medium  . Cervical radiculopathy 04/03/2016    Priority: Medium  . Essential tremor 04/18/2015    Priority: Medium  . Heavy alcohol use 04/14/2014    Priority: Medium  . CKD (chronic kidney disease), stage III ([place]) 10/15/2013    Priority: Medium  . Hypercalcemia 01/28/2007    Priority: Medium  . TOBACCO USE 12/24/2006    Priority: Medium  . Hyperlipidemia 05/26/2006    Priority: Medium  . Anxiety state 05/26/2006    Priority: Medium  . Essential hypertension 05/26/2006    Priority: Medium  . Adenomatous colon polyp 06/14/2014    Priority: Low  . H/O cold sores 10/15/2013    Priority: Low  . GERD 05/26/2006    Priority: Low  . Aortic atherosclerosis ([place]) 06/12/2017  . Palpitations 05/12/2017  . Caregiver burden 04/03/2016  . Diarrhea 04/14/2014    Medications- reviewed and updated Current Outpatient Medications  Medication Sig Dispense Refill  . alendronate (FOSAMAX) 70 MG tablet Take 1 tablet (70 mg total) by mouth once a week. Take with a full glass of water on an empty stomach. 5 tablet 11  . ALPRAZolam (XANAX) 0.25 MG tablet TAKE (1) TABLET DAILY AS NEEDED. 30 tablet 0  . amoxicillin (AMOXIL) 500 MG capsule Take 500 mg by mouth 2 (two) times daily.    Marland Kitchen buPROPion (WELLBUTRIN XL) 300 MG 24 hr tablet TAKE 1 TABLET ONCE DAILY. 90 tablet 0  . citalopram (CELEXA) 20 MG tablet TAKE 1 TABLET ONCE DAILY. 90 tablet 0  . lisinopril (PRINIVIL,ZESTRIL) 10 MG tablet TAKE 1 TABLET ONCE DAILY. 90 tablet 3  . nystatin-triamcinolone  (MYCOLOG II) cream Apply 1 application topically 3 (three) times daily as needed. Reported on 04/18/2015    . pantoprazole (PROTONIX) 40 MG tablet TAKE 1 TABLET ONCE DAILY. 90 tablet 1  . rosuvastatin (CRESTOR) 40 MG tablet TAKE 1 TABLET ONCE DAILY. 90 tablet 1   No current facility-administered medications for this visit.     Objective: BP 118/64 (BP Location: Left Arm, Patient Position: Sitting, Cuff Size: Normal)   Pulse 74   Temp 97.6 F (36.4 C) (Oral)   Ht 5\' 4"  (1.626 m)   Wt 154 lb 9.6 oz (70.1 kg)   LMP  (LMP Unknown)   SpO2 97%   BMI 26.54 kg/m  Gen: NAD, resting comfortably CV: RRR no murmurs rubs or gallops Lungs: CTAB no crackles, wheeze, rhonchi MSK- pain over right anterior ribs.  No pain over shoulder.  Some pain with palpation over right upper humerus.  Patient with good forward flexion of shoulder but pain with abduction at about 90 degrees. Ext: no edema Skin: warm, dry Neuro: Moves all extremities- difficulty with abduction of right shoulder due to pain  Assessment/Plan:  Fall, initial encounter - Plan: DG Humerus Right, DG Ribs Unilateral Right  Rib pain on right side - Plan: DG Ribs Unilateral Right  Pain in right upper arm - Plan: DG Humerus Right S:  last night tripped over husbands cane in the house.  Fell onto carpetted floor- had arm tucked in against ribs- felt immediate pain in right upper arm as well as pain along ribs on right anterior side. She was able to get up by herself thankfully.   Pain level in arm 5/10 and in ribs 3/10. Arm pain with trying to elevate it hurts worse in the arm/grabbing pain  Some mild pain into right neck- likely whiplash like injury with fall. No midline pain.  A/P: 72 year old female with a fall after tripping over husband's cane.  Fall education provided-needs to be careful with footing.  Needs to encourage husband to keep cane in safer position.  I did an initial review on her rib and humerus x-rays and did not see  obvious fracture.  Is having some trouble with abduction of the right arm-discussed could have provoked a rotator cuff issue and we should follow-up if she is not improving in the next few  weeks.  From AVS:  " I think this is pain from significant fall/bone bruising but we will await radiology overread to make sure no fractures in ribs on arm.   Would be worth scheduling tylenol 650 mg every 6 hours while awake. Or could buy some extended release tablets and take 2 every 8 hours while awake.   Let us know if worsening symptoms like trouble breathing, worsening pain, fever. Can always send you to sports medicine if shoulder/arm dont improve or I could see you back first if you prefer "  Future Appointments  Date Time Provider [place]  11/13/2017  2:15 PM Marin Olp, MD [facility]  11/20/2017  2:00 PM [facility] NURSE [facility]   No follow-ups on file.  Lab/Order associations: Fall, initial encounter - Plan: DG Humerus Right, DG Ribs Unilateral Right  Rib pain on right side - Plan: DG Ribs Unilateral Right  Pain in right upper arm - Plan: DG Humerus Right  Return precautions advised.  [name], MD

## 2017-11-11 NOTE — Patient Instructions (Addendum)
I think this is pain from significant fall/bone bruising but we will await radiology overread to make sure no fractures in ribs on arm.   Would be worth scheduling tylenol 650 mg every 6 hours while awake. Or could buy some extended release tablets and take 2 every 8 hours while awake.   Let us know if worsening symptoms like trouble breathing, worsening pain, fever. Can always send you to sports medicine if shoulder/arm dont improve or I could see you back first if you prefer  No other changes today- will be in touch.

## 2017-11-13 ENCOUNTER — Ambulatory Visit: Payer: PPO | Admitting: Family Medicine

## 2017-11-20 ENCOUNTER — Ambulatory Visit: Payer: PPO

## 2017-12-03 ENCOUNTER — Ambulatory Visit (INDEPENDENT_AMBULATORY_CARE_PROVIDER_SITE_OTHER): Payer: PPO

## 2017-12-03 DIAGNOSIS — E538 Deficiency of other specified B group vitamins: Secondary | ICD-10-CM | POA: Diagnosis not present

## 2017-12-03 MED ORDER — CYANOCOBALAMIN 1000 MCG/ML IJ SOLN
1000.0000 ug | Freq: Once | INTRAMUSCULAR | Status: AC
  Administered 2017-12-03: 1000 ug via INTRAMUSCULAR

## 2017-12-03 NOTE — Patient Instructions (Signed)
There are no preventive care reminders to display for this patient.  Depression screen [facility] 2/9 08/12/2017 05/12/2017 07/22/2016  Decreased Interest 0 0 0  Down, Depressed, Hopeless 0 0 0  PHQ - 2 Score 0 0 0  Altered sleeping - 1 -  Tired, decreased energy - 1 -  Change in appetite - 0 -  Feeling bad or failure about yourself  - 0 -  Trouble concentrating - 0 -  Moving slowly or fidgety/restless - 0 -  Suicidal thoughts - 0 -  PHQ-9 Score - 2 -  Difficult doing work/chores - Not difficult at all -

## 2017-12-03 NOTE — Progress Notes (Signed)
Patient here today for B12 injection. Administered in left arm. Tolerated well.

## 2017-12-10 ENCOUNTER — Encounter: Payer: Self-pay | Admitting: Family Medicine

## 2017-12-10 ENCOUNTER — Ambulatory Visit (INDEPENDENT_AMBULATORY_CARE_PROVIDER_SITE_OTHER): Payer: PPO | Admitting: Family Medicine

## 2017-12-10 VITALS — BP 110/62 | HR 57 | Temp 97.6°F | Ht 64.0 in | Wt 150.0 lb

## 2017-12-10 DIAGNOSIS — N183 Chronic kidney disease, stage 3 unspecified: Secondary | ICD-10-CM

## 2017-12-10 DIAGNOSIS — E538 Deficiency of other specified B group vitamins: Secondary | ICD-10-CM

## 2017-12-10 DIAGNOSIS — F411 Generalized anxiety disorder: Secondary | ICD-10-CM | POA: Diagnosis not present

## 2017-12-10 DIAGNOSIS — I10 Essential (primary) hypertension: Secondary | ICD-10-CM | POA: Diagnosis not present

## 2017-12-10 DIAGNOSIS — E785 Hyperlipidemia, unspecified: Secondary | ICD-10-CM | POA: Diagnosis not present

## 2017-12-10 LAB — BASIC METABOLIC PANEL
BUN: 14 mg/dL (ref 6–23)
CALCIUM: 10.5 mg/dL (ref 8.4–10.5)
CO2: 27 mEq/L (ref 19–32)
Chloride: 104 mEq/L (ref 96–112)
Creatinine, Ser: 1.2 mg/dL (ref 0.40–1.20)
GFR: 46.78 mL/min — AB (ref >60.00)
Glucose, Bld: 80 mg/dL (ref 70–99)
POTASSIUM: 3.9 meq/L (ref 3.5–5.1)
SODIUM: 138 meq/L (ref 135–145)

## 2017-12-10 LAB — VITAMIN B12: VITAMIN B 12: 698 pg/mL (ref 211–911)

## 2017-12-10 MED ORDER — LISINOPRIL 5 MG PO TABS: 5.0000 mg | ORAL_TABLET | Freq: Every day | ORAL | 3 refills | Status: DC

## 2017-12-10 NOTE — Assessment & Plan Note (Signed)
S: controlled on lisinopril 10 mg. Having orthostatic symptoms with standing at times BP Readings from Last 3 Encounters:  12/10/17 110/62  11/11/17 118/64  08/12/17 102/62  A/P: We discussed blood pressure goal of <140/90. Due to orthostatic issues- we will reduce to 5mg  lisinopril today with follow up in 1-2 months

## 2017-12-10 NOTE — Assessment & Plan Note (Signed)
S: Well controlled on Crestor 40 mg with last LDL 78 in May 2019  A/P: Stable-continue current medications

## 2017-12-10 NOTE — Progress Notes (Signed)
Subjective:  Crystal Carroll is a 72 y.o. year old very pleasant female patient who presents for/with See problem oriented charting ROS- No chest pain or shortness of breath. No headache or blurry vision.     Past Medical History-  Patient Active Problem List   Diagnosis Date Noted  . Vitamin B12 deficiency 05/12/2017    Priority: Medium  . Cervical radiculopathy 04/03/2016    Priority: Medium  . Essential tremor 04/18/2015    Priority: Medium  . Heavy alcohol use 04/14/2014    Priority: Medium  . CKD (chronic kidney disease), stage III ([place]) 10/15/2013    Priority: Medium  . Hypercalcemia 01/28/2007    Priority: Medium  . TOBACCO USE 12/24/2006    Priority: Medium  . Hyperlipidemia 05/26/2006    Priority: Medium  . Anxiety state 05/26/2006    Priority: Medium  . Essential hypertension 05/26/2006    Priority: Medium  . Adenomatous colon polyp 06/14/2014    Priority: Low  . H/O cold sores 10/15/2013    Priority: Low  . GERD 05/26/2006    Priority: Low  . Aortic atherosclerosis ([place]) 06/12/2017  . Palpitations 05/12/2017  . Caregiver burden 04/03/2016  . Diarrhea 04/14/2014    Medications- reviewed and updated Current Outpatient Medications  Medication Sig Dispense Refill  . alendronate (FOSAMAX) 70 MG tablet Take 1 tablet (70 mg total) by mouth once a week. Take with a full glass of water on an empty stomach. 5 tablet 11  . ALPRAZolam (XANAX) 0.25 MG tablet TAKE (1) TABLET DAILY AS NEEDED. 30 tablet 0  . amoxicillin (AMOXIL) 500 MG capsule Take 500 mg by mouth 2 (two) times daily.    Marland Kitchen buPROPion (WELLBUTRIN XL) 300 MG 24 hr tablet TAKE 1 TABLET ONCE DAILY. 90 tablet 0  . citalopram (CELEXA) 20 MG tablet TAKE 1 TABLET ONCE DAILY. 90 tablet 0  . lisinopril (PRINIVIL,ZESTRIL) 5 MG tablet Take 1 tablet (5 mg total) by mouth daily. 90 tablet 3  . nystatin-triamcinolone (MYCOLOG II) cream Apply 1 application topically 3 (three) times daily as needed. Reported on 04/18/2015     . pantoprazole (PROTONIX) 40 MG tablet TAKE 1 TABLET ONCE DAILY. 90 tablet 1  . rosuvastatin (CRESTOR) 40 MG tablet TAKE 1 TABLET ONCE DAILY. 90 tablet 1   No current facility-administered medications for this visit.     Objective: BP 110/62 (BP Location: Left Arm, Patient Position: Sitting, Cuff Size: Large)   Pulse (!) 57   Temp 97.6 F (36.4 C) (Oral)   Ht 5\' 4"  (1.626 m)   Wt 150 lb (68 kg)   LMP  (LMP Unknown)   SpO2 98%   BMI 25.75 kg/m  Gen: NAD, resting comfortably CV: RRR no murmurs rubs or gallops Lungs: CTAB no crackles, wheeze, rhonchi Abdomen: soft/nontender/nondistended/normal bowel sounds.   Ext: no edema Skin: warm, dry  Assessment/Plan:  Other notes: 1.down 4 more lbs . BMI 25- congratulated her on success- contineu healthy eating efforts, cutting down on alcohol certainly has helped  Essential hypertension S: controlled on lisinopril 10 mg. Having orthostatic symptoms with standing at times BP Readings from Last 3 Encounters:  12/10/17 110/62  11/11/17 118/64  08/12/17 102/62  A/P: We discussed blood pressure goal of <140/90. Due to orthostatic issues- we will reduce to 5mg  lisinopril today with follow up in 1-2 months  Hyperlipidemia S: Well controlled on Crestor 40 mg with last LDL 78 in May 2019  A/P: Stable-continue current medications  Anxiety state S: Patient  continues on Wellbutrin 300 mg and Celexa 20 mg. She does well as long as her husband does as long as her husband is not more upset- he has parkinsons and that can be a big stressor  Has done well and has kept alcohol to one spritzer  A/P: doing well, continue current medicines.    CKD (chronic kidney disease), stage III S: mild reduction in kidney function but has been stable lately in hight 30s or 40s. Knows to avoid nsaids- tylenol if needed A/P:update bmet today  Vitamin B12 deficiency S: continues monthly b12 injections Lab Results  Component Value Date   VITAMINB12 412  08/12/2017  A/P:  Hopefully stable- update b12  Discussed lisinopril changes verbally  Return in about 3 months (around 03/11/2018) for follow up- or sooner if needed.  Lab/Order associations: Essential hypertension, CKD III - Plan: Basic metabolic panel  Vitamin M07 deficiency - Plan: Vitamin B12  Meds ordered this encounter  Medications  . lisinopril (PRINIVIL,ZESTRIL) 5 MG tablet    Sig: Take 1 tablet (5 mg total) by mouth daily.    Dispense:  90 tablet    Refill:  3    Return precautions advised.  [name], MD

## 2017-12-10 NOTE — Assessment & Plan Note (Signed)
S: mild reduction in kidney function but has been stable lately in hight 30s or 40s. Knows to avoid nsaids- tylenol if needed A/P:update bmet today

## 2017-12-10 NOTE — Assessment & Plan Note (Signed)
S: continues monthly b12 injections Lab Results  Component Value Date   VITAMINB12 412 08/12/2017  A/P:  Hopefully stable- update b12

## 2017-12-10 NOTE — Assessment & Plan Note (Signed)
S: Patient continues on Wellbutrin 300 mg and Celexa 20 mg. She does well as long as her husband does as long as her husband is not more upset- he has parkinsons and that can be a big stressor  Has done well and has kept alcohol to one spritzer  A/P: doing well, continue current medicines.

## 2017-12-10 NOTE — Patient Instructions (Addendum)
Please stop by lab before you go  Great to see you today ! Have a Merry Christmas!

## 2017-12-19 ENCOUNTER — Other Ambulatory Visit: Payer: Self-pay | Admitting: Family Medicine

## 2017-12-28 ENCOUNTER — Other Ambulatory Visit: Payer: Self-pay | Admitting: Family Medicine

## 2018-01-20 ENCOUNTER — Ambulatory Visit (INDEPENDENT_AMBULATORY_CARE_PROVIDER_SITE_OTHER): Payer: PPO | Admitting: Family Medicine

## 2018-01-20 ENCOUNTER — Encounter: Payer: Self-pay | Admitting: Family Medicine

## 2018-01-20 ENCOUNTER — Encounter

## 2018-01-20 VITALS — BP 94/58 | HR 72 | Temp 97.7°F | Ht 64.0 in | Wt 148.6 lb

## 2018-01-20 DIAGNOSIS — R2681 Unsteadiness on feet: Secondary | ICD-10-CM | POA: Insufficient documentation

## 2018-01-20 DIAGNOSIS — I10 Essential (primary) hypertension: Secondary | ICD-10-CM

## 2018-01-20 DIAGNOSIS — R29898 Other symptoms and signs involving the musculoskeletal system: Secondary | ICD-10-CM | POA: Diagnosis not present

## 2018-01-20 MED ORDER — ALPRAZOLAM 0.25 MG PO TABS: ORAL_TABLET | ORAL | 0 refills | Status: DC

## 2018-01-20 MED ORDER — PREDNISONE 20 MG PO TABS: ORAL_TABLET | ORAL | 0 refills | Status: DC

## 2018-01-20 NOTE — Assessment & Plan Note (Addendum)
right lower extremity weakness  s:On Saturday felt very unbalanced Felt low back pain and into both sides but not down into legs States walked like she was drunk even though she wasn't drunk Did not note any numbness initially  By Sunday then felt numbness into right leg Also noted some numbness yesterday up into right side but not into her arm.   At that time-neck was not bothering her.  Numbness is now improving on her right side Balance issues have largely improved-she can at least walk with a cane now but still does not feel like she has as good of control on the right side.  She does admit doing well with prednisone in the past  Of note- patient has a very good friend Dr. Linna Darner who evaluated patient on 01/16/2018-his notes state symptoms started at 11 AM and gradually over 10 minutes developed a sense of being unbalanced with rubbery like legs.  Symptoms seem to progress over 24 hours.  She complained of aching and pain at her waist level with lateral radiation.  Symptoms seem to improve with symptoms.  No lower extremity pain, numbness or tingling initially noted-see notes above about how this developed at a later time.  No fecal or urinary incontinence-that is consistent today.  Patient does have a history of cervical spine stenosis at C5-C7.  She has a titanium implant by Dr. Earle Gell of neurosurgery.  Dr. Linna Darner noted that her gait was broad-based and steady.  She had deep tendon reflexes 1.5+ as well as good strength to opposition in all extremities.  Her light touch was intact in bilateral lower extremities. A/P: Patient is a 73 year old female who presents with unstable gait (seems to swing the right leg at times and does not land on right foot nearly as gracefully as the left foot) and  right lower extremity weakness  (mild with 5-/5 on right compared to 5/5 on left) along with numbness in the right lower leg-the symptoms are improving.  I believe we need to get a CT scan to rule out  possible stroke.  I do think etiology from the back and possible lumbar stenosis as she has had spinal stenosis in cervical region is possible.  Would have gotten x-rays today but unfortunately our x-ray technician said already left the building at time of exam.  We agreed if CT is negative to trial a course of prednisone and get an x-ray if she does not improve with that.    In regards to possible stroke-considered aspirin-want to wait till CT scan is completed.  She is already on high-dose statin.  Blood pressure is controlled today- actually somewhat overcontrolled so we opted to hold her lisinopril for now-she states she is only taking half of a 5 mg tablet right now anyway  Depending on results- consider CT scan

## 2018-01-20 NOTE — Patient Instructions (Addendum)
I think this is most likely coming from your back- I want to try prednisone  First I want to get a CT scan to make sure no stroke with the balance issues you are having. We will try to get that set up this week hopefully tomorrow or Thursday  If you have new or worsening symptoms- go to the hospital  Hold your blood pressure medicine for now with how low your blood pressure is - restart if blood pressure gets above 130/85 .

## 2018-01-20 NOTE — Progress Notes (Addendum)
Subjective:  Crystal Carroll is a 73 y.o. year old very pleasant female patient who presents for/with See problem oriented charting ROS- No facial or extremity weakness- other than possibly right leg and also unstable gait. No slurred words or trouble swallowing. no blurry vision or double vision. Notes paresthesias as below. No confusion or word finding difficulties.    Past Medical History-  Patient Active Problem List   Diagnosis Date Noted  . Vitamin B12 deficiency 05/12/2017    Priority: Medium  . Cervical radiculopathy 04/03/2016    Priority: Medium  . Essential tremor 04/18/2015    Priority: Medium  . Heavy alcohol use 04/14/2014    Priority: Medium  . CKD (chronic kidney disease), stage III ([place]) 10/15/2013    Priority: Medium  . Hypercalcemia 01/28/2007    Priority: Medium  . TOBACCO USE 12/24/2006    Priority: Medium  . Hyperlipidemia 05/26/2006    Priority: Medium  . Anxiety state 05/26/2006    Priority: Medium  . Essential hypertension 05/26/2006    Priority: Medium  . Adenomatous colon polyp 06/14/2014    Priority: Low  . H/O cold sores 10/15/2013    Priority: Low  . GERD 05/26/2006    Priority: Low  . Unstable gait 01/20/2018  . Aortic atherosclerosis ([place]) 06/12/2017  . Palpitations 05/12/2017  . Caregiver burden 04/03/2016  . Diarrhea 04/14/2014    Medications- reviewed and updated Current Outpatient Medications  Medication Sig Dispense Refill  . alendronate (FOSAMAX) 70 MG tablet Take 1 tablet (70 mg total) by mouth once a week. Take with a full glass of water on an empty stomach. 5 tablet 11  . ALPRAZolam (XANAX) 0.25 MG tablet TAKE (1) TABLET DAILY AS NEEDED. 30 tablet 0  . amoxicillin (AMOXIL) 500 MG capsule Take 500 mg by mouth 2 (two) times daily.    Marland Kitchen buPROPion (WELLBUTRIN XL) 300 MG 24 hr tablet TAKE 1 TABLET ONCE DAILY. 90 tablet 1  . citalopram (CELEXA) 20 MG tablet TAKE 1 TABLET ONCE DAILY. 90 tablet 1  . lisinopril (PRINIVIL,ZESTRIL) 5 MG  tablet Take 1 tablet (5 mg total) by mouth daily. 90 tablet 3  . nystatin-triamcinolone (MYCOLOG II) cream Apply 1 application topically 3 (three) times daily as needed. Reported on 04/18/2015    . pantoprazole (PROTONIX) 40 MG tablet TAKE 1 TABLET ONCE DAILY. 90 tablet 1  . rosuvastatin (CRESTOR) 40 MG tablet TAKE 1 TABLET ONCE DAILY. 90 tablet 1  . predniSONE (DELTASONE) 20 MG tablet Take 2 pills for 3 days, 1 pill for 4 days 10 tablet 0   No current facility-administered medications for this visit.     Objective: BP (!) 94/58 (BP Location: Left Arm, Patient Position: Sitting, Cuff Size: Normal)   Pulse 72   Temp 97.7 F (36.5 C) (Oral)   Ht 5\' 4"  (1.626 m)   Wt 148 lb 9.6 oz (67.4 kg)   LMP  (LMP Unknown)   SpO2 97%   BMI 25.51 kg/m  Gen: NAD, resting comfortably CV: RRR no murmurs rubs or gallops Lungs: CTAB no crackles, wheeze, rhonchi Abdomen: soft/nontender/nondistended/normal bowel sounds. No rebound or guarding.  Ext: no edema Skin: warm, dry Neuro: CN II-XII intact, sensation and reflexes normal throughout, 5/5 muscle strength in bilateral upper and lower extremities- with exception in left lower extremity 5-/5 on right lower extremity particularly with flexion- may be 4+/5. Normal finger to nose. Normal rapid alternating movements. No pronator drift. With gait- Swings right foot out at times- foot hits  harder on the ground than typical. Walks with cane. Needs assist with balance. Similar sensation in right and left foot to monofilament. 2+ reflexes at knee.   Assessment/Plan:  Unstable gait right lower extremity weakness  s:On Saturday felt very unbalanced Felt low back pain and into both sides but not down into legs States walked like she was drunk even though she wasn't drunk Did not note any numbness initially  By Sunday then felt numbness into right leg Also noted some numbness yesterday up into right side but not into her arm.   At that time-neck was not bothering  her.  Numbness is now improving on her right side Balance issues have largely improved-she can at least walk with a cane now but still does not feel like she has as good of control on the right side.  She does admit doing well with prednisone in the past  Of note- patient has a very good friend Dr. Linna Darner who evaluated patient on 01/16/2018-his notes state symptoms started at 11 AM and gradually over 10 minutes developed a sense of being unbalanced with rubbery like legs.  Symptoms seem to progress over 24 hours.  She complained of aching and pain at her waist level with lateral radiation.  Symptoms seem to improve with symptoms.  No lower extremity pain, numbness or tingling initially noted-see notes above about how this developed at a later time.  No fecal or urinary incontinence-that is consistent today.  Patient does have a history of cervical spine stenosis at C5-C7.  She has a titanium implant by Dr. Earle Gell of neurosurgery.  Dr. Linna Darner noted that her gait was broad-based and steady.  She had deep tendon reflexes 1.5+ as well as good strength to opposition in all extremities.  Her light touch was intact in bilateral lower extremities. A/P: Patient is a 73 year old female who presents with unstable gait (seems to swing the right leg at times and does not land on right foot nearly as gracefully as the left foot) and  right lower extremity weakness  (mild with 5-/5 on right compared to 5/5 on left) along with numbness in the right lower leg-the symptoms are improving.  I believe we need to get a CT scan to rule out possible stroke.  I do think etiology from the back and possible lumbar stenosis as she has had spinal stenosis in cervical region is possible.  Would have gotten x-rays today but unfortunately our x-ray technician said already left the building at time of exam.  We agreed if CT is negative to trial a course of prednisone and get an x-ray if she does not improve with that.    In regards to  possible stroke-considered aspirin-want to wait till CT scan is completed.  She is already on high-dose statin.  Blood pressure is controlled today- actually somewhat overcontrolled so we opted to hold her lisinopril for now-she states she is only taking half of a 5 mg tablet right now anyway  Depending on results- consider CT scan   Essential hypertension S: controlled on lisinopril 5 mg but perhaps over controlled. She thinks only taking 2.5mg  BP Readings from Last 3 Encounters:  01/20/18 (!) 94/58  12/10/17 110/62  11/11/17 118/64  A/P: We discussed blood pressure goal of <140/90. Hold lisinopril unless BP >130/85 at home.     Future Appointments  Date Time Provider [place]  03/13/2018 11:00 AM Marin Olp, MD [facility]   Lab/Order associations: Weakness of right lower extremity -  Plan: CT Head Wo Contrast, CANCELED: CT Head Wo Contrast  Unstable gait - Plan: CT Head Wo Contrast, CANCELED: CT Head Wo Contrast  Essential hypertension  Meds ordered this encounter  Medications  . ALPRAZolam (XANAX) 0.25 MG tablet    Sig: TAKE (1) TABLET DAILY AS NEEDED.    Dispense:  30 tablet    Refill:  0  . predniSONE (DELTASONE) 20 MG tablet    Sig: Take 2 pills for 3 days, 1 pill for 4 days    Dispense:  10 tablet    Refill:  0    Return precautions advised.  [name], MD

## 2018-01-20 NOTE — Assessment & Plan Note (Signed)
S: controlled on lisinopril 5 mg but perhaps over controlled. She thinks only taking 2.5mg  BP Readings from Last 3 Encounters:  01/20/18 (!) 94/58  12/10/17 110/62  11/11/17 118/64  A/P: We discussed blood pressure goal of <140/90. Hold lisinopril unless BP >130/85 at home.

## 2018-01-21 ENCOUNTER — Ambulatory Visit (INDEPENDENT_AMBULATORY_CARE_PROVIDER_SITE_OTHER)
Admission: RE | Admit: 2018-01-21 | Discharge: 2018-01-21 | Disposition: A | Discharge status: Home / Self Care | Payer: PPO | Source: Ambulatory Visit | Attending: Family Medicine | Admitting: Family Medicine

## 2018-01-21 DIAGNOSIS — R2689 Other abnormalities of gait and mobility: Secondary | ICD-10-CM | POA: Diagnosis not present

## 2018-01-21 DIAGNOSIS — R2681 Unsteadiness on feet: Secondary | ICD-10-CM | POA: Diagnosis not present

## 2018-01-21 DIAGNOSIS — R29898 Other symptoms and signs involving the musculoskeletal system: Secondary | ICD-10-CM

## 2018-01-22 ENCOUNTER — Telehealth: Payer: Self-pay | Admitting: Family Medicine

## 2018-01-22 NOTE — Telephone Encounter (Signed)
See note  Copied from [place] [id number]. Topic: General - Call Back - No Documentation >> Jan 22, 2018 12:50 PM Sheran Luz wrote: Reason for CRM: Patient returning call to [facility] regarding imaging from 01/15. See result note.

## 2018-02-20 ENCOUNTER — Encounter: Payer: Self-pay | Admitting: Family Medicine

## 2018-02-20 ENCOUNTER — Ambulatory Visit (INDEPENDENT_AMBULATORY_CARE_PROVIDER_SITE_OTHER): Payer: PPO | Admitting: Family Medicine

## 2018-02-20 ENCOUNTER — Ambulatory Visit (INDEPENDENT_AMBULATORY_CARE_PROVIDER_SITE_OTHER): Payer: PPO

## 2018-02-20 VITALS — BP 102/60 | HR 68 | Temp 97.7°F | Ht 64.0 in | Wt 149.4 lb

## 2018-02-20 DIAGNOSIS — R269 Unspecified abnormalities of gait and mobility: Secondary | ICD-10-CM

## 2018-02-20 DIAGNOSIS — I10 Essential (primary) hypertension: Secondary | ICD-10-CM | POA: Diagnosis not present

## 2018-02-20 DIAGNOSIS — M545 Low back pain, unspecified: Secondary | ICD-10-CM

## 2018-02-20 DIAGNOSIS — M47816 Spondylosis without myelopathy or radiculopathy, lumbar region: Secondary | ICD-10-CM | POA: Diagnosis not present

## 2018-02-20 MED ORDER — DICLOFENAC SODIUM 1 % TD GEL: 2.0000 g | Freq: Four times a day (QID) | TRANSDERMAL | 3 refills | Status: DC

## 2018-02-20 NOTE — Patient Instructions (Addendum)
Please stop by x-ray before you go If you do not have mychart- we will call you about results within 5 business days of [place] receiving them.  If you have mychart- we will send your results within 3 business days of [place] receiving them.  If abnormal or we want to clarify a result, we will call or mychart you to make sure you receive the message.  If you have questions or concerns or don't hear within 5-7 days, please send [place] a message or call us.   We will call you within two weeks about your referral to Dr. Tamala Julian of sports medicine. If you do not hear within 3 weeks, give [place] a call.   Remain off lisinopril unless blood pressure gets over 140/90  Try Voltaren gel for the knees in the mornings-can use up to 4 times a day.  Only pick this up if affordable

## 2018-02-20 NOTE — Progress Notes (Signed)
Phone [phone]   Subjective:  Crystal Carroll is a 73 y.o. year old very pleasant female patient who presents for/with See problem oriented charting ROS-continued issues with imbalance.  No chest pain or shortness of breath.  Has a fluttering feeling in her right neck at times.  Back pain when helping her husband put on compression stockings  Past Medical History-  Patient Active Problem List   Diagnosis Date Noted  . Vitamin B12 deficiency 05/12/2017    Priority: Medium  . Cervical radiculopathy 04/03/2016    Priority: Medium  . Essential tremor 04/18/2015    Priority: Medium  . Heavy alcohol use 04/14/2014    Priority: Medium  . CKD (chronic kidney disease), stage III ([place]) 10/15/2013    Priority: Medium  . Hypercalcemia 01/28/2007    Priority: Medium  . TOBACCO USE 12/24/2006    Priority: Medium  . Hyperlipidemia 05/26/2006    Priority: Medium  . Anxiety state 05/26/2006    Priority: Medium  . Essential hypertension 05/26/2006    Priority: Medium  . Adenomatous colon polyp 06/14/2014    Priority: Low  . H/O cold sores 10/15/2013    Priority: Low  . GERD 05/26/2006    Priority: Low  . Unstable gait 01/20/2018  . Aortic atherosclerosis ([place]) 06/12/2017  . Palpitations 05/12/2017  . Caregiver burden 04/03/2016  . Diarrhea 04/14/2014    Medications- reviewed and updated Current Outpatient Medications  Medication Sig Dispense Refill  . alendronate (FOSAMAX) 70 MG tablet Take 1 tablet (70 mg total) by mouth once a week. Take with a full glass of water on an empty stomach. 5 tablet 11  . ALPRAZolam (XANAX) 0.25 MG tablet TAKE (1) TABLET DAILY AS NEEDED. 30 tablet 0  . buPROPion (WELLBUTRIN XL) 300 MG 24 hr tablet TAKE 1 TABLET ONCE DAILY. 90 tablet 1  . citalopram (CELEXA) 20 MG tablet TAKE 1 TABLET ONCE DAILY. 90 tablet 1  . nystatin-triamcinolone (MYCOLOG II) cream Apply 1 application topically 3 (three) times daily as needed. Reported on 04/18/2015    .  pantoprazole (PROTONIX) 40 MG tablet TAKE 1 TABLET ONCE DAILY. 90 tablet 1  . rosuvastatin (CRESTOR) 40 MG tablet TAKE 1 TABLET ONCE DAILY. 90 tablet 1   No current facility-administered medications for this visit.      Objective:  BP 102/60 (BP Location: Left Arm, Patient Position: Sitting)   Pulse 68   Temp 97.7 F (36.5 C) (Oral)   Ht 5\' 4"  (1.626 m)   Wt 149 lb 6.4 oz (67.8 kg)   LMP  (LMP Unknown)   SpO2 97%   BMI 25.64 kg/m  Gen: NAD, resting comfortably CV: RRR no murmurs rubs or gallops Lungs: CTAB no crackles, wheeze, rhonchi Abdomen: soft/nontender/nondistended/normal bowel sounds. No rebound or guarding.  Ext: no edema Skin: warm, dry MSK/neuro: Gait appears much more stable.  Last visit seem to be swinging the right foot out at times and that foot would hit harder on the ground.  She was using a cane at that time and needed assist with balance.  She is able to walk without assist today and appears much more balanced but still slightly unstable.  Seems to swing the left foot out at this visit slightly.    Assessment and Plan    Unstable gait/right lower extremity weakness S: Patient has had significant improvement over the last month and her gait imbalance.  She does not feel weak in her right lower extremity as she did last visit.  She is no longer having to use a cane.  She seemed to improve on prednisone- gait and balance better- pain in knees improved.  She is on even more so significant improvement this last Sunday-not sure what changed that day.  She wakes up each morning and feels like she has been hit by a truck in respect to her knees-knees seem to hurt pretty bad in the morning.  She does not have an orthopedist.  She has no pain at the moment.  She is complaining of some low back pain with putting on her husband's compression stockings but no regular back pain outside of that.  Discussed that Xanax can cause imbalance but she suffers from imbalance even  not on  days that she uses Xanax  A/P: Patient with unstable gait/right lower extremity weakness which improved on prednisone and with continued intermittent low back pain-possible lumbar spine issue.  Will get x-rays.  Pain is intermittent so I do not think we need pain medication.  In regards to the knees- can trial Voltaren gel-lower risk with her age and hypertension history -Patient request visit with Dr. Tamala Julian of sports medicine- will refer today for him to evaluate the low back and knee pain and see if he can comment on the unstable gait as well. - I also considered referral to physical therapy for gait imbalance -Also considering neurology referral depending on Dr. Thompson Caul findings.   Essential hypertension S: controlled on no medication-lisinopril has been held since last visit BP Readings from Last 3 Encounters:  02/20/18 102/60  01/20/18 (!) 94/58  12/10/17 110/62  A/P: We discussed blood pressure goal of <140/90. Continue current meds: Stable without medication  Future Appointments  Date Time Provider [place]  03/13/2018 11:00 AM Marin Olp, MD [facility]   Lab/Order associations: Bilateral low back pain without sciatica, unspecified chronicity - Plan: DG Lumbar Spine Complete, Ambulatory referral to Sports Medicine  Gait abnormality - Plan: DG Lumbar Spine Complete, Ambulatory referral to Sports Medicine  Essential hypertension  Return precautions advised.  [name], MD

## 2018-02-20 NOTE — Assessment & Plan Note (Signed)
S: controlled on no medication-lisinopril has been held since last visit BP Readings from Last 3 Encounters:  02/20/18 102/60  01/20/18 (!) 94/58  12/10/17 110/62  A/P: We discussed blood pressure goal of <140/90. Continue current meds: Stable without medication

## 2018-03-05 ENCOUNTER — Encounter: Payer: Self-pay | Admitting: Family Medicine

## 2018-03-05 ENCOUNTER — Ambulatory Visit (INDEPENDENT_AMBULATORY_CARE_PROVIDER_SITE_OTHER): Payer: PPO | Admitting: Family Medicine

## 2018-03-05 VITALS — BP 140/70 | HR 69 | Ht 64.0 in | Wt 151.0 lb

## 2018-03-05 DIAGNOSIS — M545 Low back pain, unspecified: Secondary | ICD-10-CM

## 2018-03-05 DIAGNOSIS — M1710 Unilateral primary osteoarthritis, unspecified knee: Secondary | ICD-10-CM | POA: Diagnosis not present

## 2018-03-05 DIAGNOSIS — M5136 Other intervertebral disc degeneration, lumbar region: Secondary | ICD-10-CM

## 2018-03-05 MED ORDER — GABAPENTIN 100 MG PO CAPS: 200.0000 mg | ORAL_CAPSULE | Freq: Every day | ORAL | 3 refills | Status: DC

## 2018-03-05 NOTE — Assessment & Plan Note (Signed)
Degenerative disc disease, possible spinal stenosis.  Discussed which activities to do which wants to avoid.  Gabapentin given today.  Will start formal physical therapy.  X-rays pending follow-up again in 4 weeks

## 2018-03-05 NOTE — Assessment & Plan Note (Signed)
Patellofemoral arthritis bilaterally.  Discussed bracing, home exercise, topical anti-inflammatories.  Do believe some of the leg pain could be secondary to more of a spinal stenosis.  X-rays of the back ordered today.  Start with formal physical therapy as well that I think will be beneficial.  Follow-up again in 4 to 6 weeks

## 2018-03-05 NOTE — Progress Notes (Signed)
[facility] [address], [place] [address] Phone: [phone] Subjective:    I [name] am serving as a Education administrator for Dr. Hulan Saas.    CC: Bilateral knee pain and back pain  [id number]  Crystal Carroll is a 73 y.o. female coming in with complaint of back and bilateral knee pain. [place] about a month ago. Fell on her right side. Back surgery about 20 years ago. No numbness and tingling.   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  This causes significant anxiety and has not had time to take care of herself.  Past Medical History:  Diagnosis Date  . Anxiety   . GERD (gastroesophageal reflux disease)   . Hyperlipidemia   . Hypertension    Past Surgical History:  Procedure Laterality Date  . CERVICAL LAMINECTOMY  2004   Earle Gell   Social History   Socioeconomic History  . Marital status: Married    Spouse name: Not on file  . Number of children: 1  . Years of education: Not on file  . Highest education level: Not on file  Occupational History  . Not on file  Social Needs  . Financial resource strain: Not on file  . Food insecurity:    Worry: Not on file    Inability: Not on file  . Transportation needs:    Medical: Not on file    Non-medical: Not on file  Tobacco Use  . Smoking status: Former Smoker    Packs/day: 0.75    Years: 50.00    Pack years: 37.50    Types: Cigarettes    Last attempt to quit: 03/07/2013    Years since quitting: 4.9  . Smokeless tobacco: Never Used  Substance and Sexual Activity  . Alcohol use: Yes    Alcohol/week: 0.0 standard drinks    Comment: 2 per day  . Drug use: No  . Sexual activity: Not on file  Lifestyle  . Physical activity:    Days per week: Not  on file    Minutes per session: Not on file  . Stress: Not on file  Relationships  . Social connections:    Talks on phone: Not on file    Gets together: Not on file    Attends religious service: Not on file    Active member of club or organization: Not on file    Attends meetings of clubs or organizations: Not on file    Relationship status: Not on file  Other Topics Concern  . Not on file  Social History Narrative   Married to [facility] (patient) in 1981. 2nd marriage-1 child with 1 adopted grandchild.       Retired as Statistician.       Hobbies: antiques, former Firefighter      Exercise: none currently.          Allergies  Allergen Reactions  . Codeine Nausea Only  . Sulfamethoxazole     REACTION: GI upset   Family History  Problem Relation Age of Onset  . Heart attack Father 64       smoker, rheumatic fever as child  . Melanoma Mother        labia    Current Outpatient  Medications (Endocrine & Metabolic):  .  alendronate (FOSAMAX) 70 MG tablet, Take 1 tablet (70 mg total) by mouth once a week. Take with a full glass of water on an empty stomach.  Current Outpatient Medications (Cardiovascular):  .  rosuvastatin (CRESTOR) 40 MG tablet, TAKE 1 TABLET ONCE DAILY.     Current Outpatient Medications (Other):  Marland Kitchen  ALPRAZolam (XANAX) 0.25 MG tablet, TAKE (1) TABLET DAILY AS NEEDED. Marland Kitchen  buPROPion (WELLBUTRIN XL) 300 MG 24 hr tablet, TAKE 1 TABLET ONCE DAILY. .  citalopram (CELEXA) 20 MG tablet, TAKE 1 TABLET ONCE DAILY. Marland Kitchen  diclofenac sodium (VOLTAREN) 1 % GEL, Apply 2 g topically 4 (four) times daily. Marland Kitchen  nystatin-triamcinolone (MYCOLOG II) cream, Apply 1 application topically 3 (three) times daily as needed. Reported on 04/18/2015 .  pantoprazole (PROTONIX) 40 MG tablet, TAKE 1 TABLET ONCE DAILY. Marland Kitchen  gabapentin (NEURONTIN) 100 MG capsule, Take 2 capsules (200 mg total) by mouth at bedtime.    Past medical history, social, surgical and family history  all reviewed in electronic medical record.  No pertanent information unless stated regarding to the chief complaint.   Review of Systems:  No headache, visual changes, nausea, vomiting, diarrhea, constipation, dizziness, abdominal pain, skin rash, fevers, chills, night sweats, weight loss, swollen lymph nodes, body aches, joint swelling,chest pain, shortness of breath, mood changes.  Positive muscle aches  Objective  Blood pressure 140/70, pulse 69, height 5\' 4"  (1.626 m), weight 151 lb (68.5 kg), SpO2 97 %.    General: No apparent distress alert and oriented x3 mood and affect normal, dressed appropriately.  HEENT: Pupils equal, extraocular movements intact  Respiratory: Patient's speak in full sentences and does not appear short of breath  Cardiovascular: No lower extremity edema, non tender, no erythema  Skin: Warm dry intact with no signs of infection or rash on extremities or on axial skeleton.  Abdomen: Soft nontender  Neuro: Cranial nerves II through XII are intact, neurovascularly intact in all extremities with 2+ DTRs and 2+ pulses.  Lymph: No lymphadenopathy of posterior or anterior cervical chain or axillae bilaterally.  Gait mild antalgic MSK:  tender with full range of motion and good stability and symmetric strength and tone of shoulders, elbows, wrist, hip, and ankles bilaterally.  Knee exams bilaterally show the patient has lateral tracking of the patella.  Positive patellar grind.  Patient does have full range of motion.  No significant instability with varus or valgus force.  Near full range of motion.  Mild narrowing of the medial joint line  Back Exam:  Inspection: Loss of lordosis Motion: Flexion 30 deg, Extension 25 deg, Side Bending to 35 deg bilaterally,  Rotation to 35 deg bilaterally  SLR laying: Negative  XSLR laying: Negative  Palpable tenderness: Tightness of the paraspinal musculature bilaterally in the lumbar spine. FABER: Tightness bilaterally. Sensory  change: Gross sensation intact to all lumbar and sacral dermatomes.  Reflexes: 2+ at both patellar tendons, 2+ at achilles tendons, Babinski's downgoing.  Strength at foot  Plantar-flexion: 5/5 Dorsi-flexion: 5/5 Eversion: 5/5 Inversion: 5/5  Leg strength  Quad: 5/5 Hamstring: 5/5 Hip flexor: 5/5 Hip abductors: 5/5    [id number]; 15 additional minutes spent for Therapeutic exercises as stated in above notes.  This included exercises focusing on stretching, strengthening, with significant focus on eccentric aspects.   Long term goals include an improvement in range of motion, strength, endurance as well as avoiding reinjury. Patient's frequency would include in 1-2 times a day, 3-5 times a  week for a duration of 6-12 weeks. Low back exercises that included:  Pelvic tilt/bracing instruction to focus on control of the pelvic girdle and lower abdominal muscles  Glute strengthening exercises, focusing on proper firing of the glutes without engaging the low back muscles Proper stretching techniques for maximum relief for the hamstrings, hip flexors, low back and some rotation where tolerated    Proper technique shown and discussed handout in great detail with ATC.  All questions were discussed and answered.     Impression and Recommendations:     This case required medical decision making of moderate complexity. The above documentation has been reviewed and is accurate and complete Lyndal Pulley, DO       Note: This dictation was prepared with Dragon dictation along with smaller phrase technology. Any transcriptional errors that result from this process are unintentional.

## 2018-03-05 NOTE — Patient Instructions (Addendum)
Good to see you  Ice 20 minutes 2 times daily. Usually after activity and before bed. PT will be calling you   Gabapentin 200mg  at night.  If drowsy in AM then decrease to 100mg   Over the counter get  Vitamin D 2000 IU daily  Turmeric 500mg  daily  Tart cherry extract 1200mg  at night See me again in 6 weeks and we will make sure you are better

## 2018-03-13 ENCOUNTER — Ambulatory Visit (INDEPENDENT_AMBULATORY_CARE_PROVIDER_SITE_OTHER): Payer: PPO | Admitting: Family Medicine

## 2018-03-13 ENCOUNTER — Encounter: Payer: Self-pay | Admitting: Family Medicine

## 2018-03-13 VITALS — BP 120/68 | HR 55 | Temp 97.6°F | Ht 64.0 in | Wt 151.2 lb

## 2018-03-13 DIAGNOSIS — N183 Chronic kidney disease, stage 3 unspecified: Secondary | ICD-10-CM

## 2018-03-13 DIAGNOSIS — M810 Age-related osteoporosis without current pathological fracture: Secondary | ICD-10-CM

## 2018-03-13 DIAGNOSIS — E785 Hyperlipidemia, unspecified: Secondary | ICD-10-CM

## 2018-03-13 DIAGNOSIS — I10 Essential (primary) hypertension: Secondary | ICD-10-CM | POA: Diagnosis not present

## 2018-03-13 DIAGNOSIS — I700 Atherosclerosis of aorta: Secondary | ICD-10-CM | POA: Diagnosis not present

## 2018-03-13 NOTE — Progress Notes (Signed)
Phone [phone]   Subjective:  Crystal Carroll is a 73 y.o. year old very pleasant female patient who presents for/with See problem oriented charting ROS- No chest pain or shortness of breath. No headache or blurry vision. Occasional palpitations still. Gait feels more stable but still not perfect- going to work with PT soon   Past Medical History-  Patient Active Problem List   Diagnosis Date Noted  . Caregiver burden 04/03/2016    Priority: High  . Aortic atherosclerosis ([place]) 06/12/2017    Priority: Medium  . Vitamin B12 deficiency 05/12/2017    Priority: Medium  . Cervical radiculopathy 04/03/2016    Priority: Medium  . Essential tremor 04/18/2015    Priority: Medium  . Heavy alcohol use 04/14/2014    Priority: Medium  . CKD (chronic kidney disease), stage III ([place]) 10/15/2013    Priority: Medium  . Hypercalcemia 01/28/2007    Priority: Medium  . TOBACCO USE 12/24/2006    Priority: Medium  . Hyperlipidemia 05/26/2006    Priority: Medium  . Anxiety state 05/26/2006    Priority: Medium  . Essential hypertension 05/26/2006    Priority: Medium  . Patellofemoral arthritis 03/05/2018    Priority: Low  . Adenomatous colon polyp 06/14/2014    Priority: Low  . H/O cold sores 10/15/2013    Priority: Low  . GERD 05/26/2006    Priority: Low  . Osteoporosis 03/13/2018  . DDD (degenerative disc disease), lumbar 03/05/2018  . Unstable gait 01/20/2018  . Palpitations 05/12/2017  . Diarrhea 04/14/2014    Medications- reviewed and updated Current Outpatient Medications  Medication Sig Dispense Refill  . alendronate (FOSAMAX) 70 MG tablet Take 1 tablet (70 mg total) by mouth once a week. Take with a full glass of water on an empty stomach. 5 tablet 11  . ALPRAZolam (XANAX) 0.25 MG tablet TAKE (1) TABLET DAILY AS NEEDED. 30 tablet 0  . buPROPion (WELLBUTRIN XL) 300 MG 24 hr tablet TAKE 1 TABLET ONCE DAILY. 90 tablet 1  . citalopram (CELEXA) 20 MG tablet TAKE 1 TABLET ONCE  DAILY. 90 tablet 1  . diclofenac sodium (VOLTAREN) 1 % GEL Apply 2 g topically 4 (four) times daily. 100 g 3  . gabapentin (NEURONTIN) 100 MG capsule Take 2 capsules (200 mg total) by mouth at bedtime. 60 capsule 3  . nystatin-triamcinolone (MYCOLOG II) cream Apply 1 application topically 3 (three) times daily as needed. Reported on 04/18/2015    . pantoprazole (PROTONIX) 40 MG tablet TAKE 1 TABLET ONCE DAILY. 90 tablet 1  . rosuvastatin (CRESTOR) 40 MG tablet TAKE 1 TABLET ONCE DAILY. 90 tablet 1     Objective:  BP 120/68 (BP Location: Left Arm, Patient Position: Sitting, Cuff Size: Normal)   Pulse (!) 55   Temp 97.6 F (36.4 C) (Oral)   Ht 5\' 4"  (1.626 m)   Wt 151 lb 3.2 oz (68.6 kg)   LMP  (LMP Unknown)   SpO2 98%   BMI 25.95 kg/m  Gen: NAD, resting comfortably CV: RRR no murmurs rubs or gallops Lungs: CTAB no crackles, wheeze, rhonchi Ext: no edema Skin: warm, dry Neuro: Able to stand more easily without use of hands today    Assessment and Plan   #Hypertension/CKD stage III S: Patient remains off lisinopril unfortunately blood pressure remains controlled  -GFR has been stable largely in the 40s lately- in the past had been in the 30s. No nsaids. PPI of some risk but current good control- so will continue A/P:  stable x2. Continue current meds   #Osteoporosis S: Patient is compliant with Fosamax once a week.  She also takes calcium and vitamin D.   A/P: Stable. Continue current medications.    #Hyperlipidemia/aortic atherosclerosis S: Compliant with rosuvastatin 40 mg.  LDL goal at least under 100 -Aortic atherosclerosis noted CT 06/26/2017 for lung cancer screening.  Risk factor modification with blood pressure and lipid control plan. A/P:Stable. Continue current medications.    #GERD S: Compliant with Protonix 40 mg. Reflux controlled A/P: controlled- continue meds. b12 has been ok.   Lab Results  Component Value Date   [id number] 096 12/10/2017    #Anxiety/stress  management S: Compliant with Celexa 20 mg, Wellbutrin 300 mg extended release.  Also takes sparing Xanax as needed for anxiety A/P: reasonably stable- continue current medicine. PHQ2 0 and no SI.    Other notes: 1.enjoyed visit with Dr. Tamala Julian. Is going to tryto be more consistent with ice and has upcoming PT sessions.  Going to start PT tonight.    Future Appointments  Date Time Provider [place]  03/25/2018  3:00 PM Donita Brooks [facility]  04/22/2018  1:00 PM Lyndal Pulley, DO [facility]   Return in about 4 months (around 07/13/2018) for physical.  Lab/Order associations: Hyperlipidemia, unspecified hyperlipidemia type  Essential hypertension  CKD (chronic kidney disease), stage III ([facility])  Aortic atherosclerosis ([place])  Age-related osteoporosis without current pathological fracture   Return precautions advised.  [name], MD

## 2018-03-13 NOTE — Patient Instructions (Addendum)
labwork likely next visit  Glad you are doing well! Hopefully PT will improve things

## 2018-03-25 ENCOUNTER — Ambulatory Visit: Payer: PPO | Admitting: Physical Therapy

## 2018-03-31 ENCOUNTER — Other Ambulatory Visit: Payer: Self-pay | Admitting: Family Medicine

## 2018-04-06 ENCOUNTER — Telehealth: Payer: Self-pay

## 2018-04-06 NOTE — Telephone Encounter (Signed)
Called patient to reschedule appointment on 04/22/2018. Left message.

## 2018-04-09 ENCOUNTER — Ambulatory Visit: Payer: PPO | Admitting: Physical Therapy

## 2018-04-12 NOTE — Progress Notes (Deleted)
[facility] [place], [place] [address] Phone: [phone] Subjective:    I'm seeing this patient by the request  of:    CC:   [id number]  Crystal Carroll is a 73 y.o. female coming in with complaint of ***  Onset-  Location Duration-  Character- Aggravating factors- Reliving factors-  Therapies tried-  Severity-     Past Medical History:  Diagnosis Date  . Anxiety   . GERD (gastroesophageal reflux disease)   . Hyperlipidemia   . Hypertension    Past Surgical History:  Procedure Laterality Date  . CERVICAL LAMINECTOMY  2004   Earle Gell   Social History   Socioeconomic History  . Marital status: Married    Spouse name: Not on file  . Number of children: 1  . Years of education: Not on file  . Highest education level: Not on file  Occupational History  . Not on file  Social Needs  . Financial resource strain: Not on file  . Food insecurity:    Worry: Not on file    Inability: Not on file  . Transportation needs:    Medical: Not on file    Non-medical: Not on file  Tobacco Use  . Smoking status: Former Smoker    Packs/day: 0.75    Years: 50.00    Pack years: 37.50    Types: Cigarettes    Last attempt to quit: 03/07/2013    Years since quitting: 5.1  . Smokeless tobacco: Never Used  Substance and Sexual Activity  . Alcohol use: Yes    Alcohol/week: 0.0 standard drinks    Comment: 2 per day  . Drug use: No  . Sexual activity: Not on file  Lifestyle  . Physical activity:    Days per week: Not on file    Minutes per session: Not on file  . Stress: Not on file  Relationships  . Social connections:    Talks on phone: Not on file    Gets together: Not on file    Attends religious service: Not on file    Active member of club or organization: Not on file    Attends meetings of clubs or organizations: Not on file    Relationship status: Not on file  Other Topics Concern  . Not on file  Social  History Narrative   Married to [facility] (patient) in 1981. 2nd marriage-1 child with 1 adopted grandchild.       Retired as Statistician.       Hobbies: antiques, former Firefighter      Exercise: none currently.          Allergies  Allergen Reactions  . Codeine Nausea Only  . Sulfamethoxazole     REACTION: GI upset   Family History  Problem Relation Age of Onset  . Heart attack Father 64       smoker, rheumatic fever as child  . Melanoma Mother        labia    Current Outpatient Medications (Endocrine & Metabolic):  .  alendronate (FOSAMAX) 70 MG tablet, Take 1 tablet (70 mg total) by mouth once a week. Take with a full glass of water on an empty stomach.  Current Outpatient Medications (Cardiovascular):  .  rosuvastatin (CRESTOR) 40 MG tablet, TAKE 1 TABLET ONCE DAILY.     Current Outpatient Medications (Other):  Marland Kitchen  ALPRAZolam (XANAX) 0.25 MG tablet, TAKE (1) TABLET DAILY AS NEEDED. Marland Kitchen  buPROPion (WELLBUTRIN XL) 300 MG 24 hr tablet, TAKE 1 TABLET ONCE DAILY. .  citalopram (CELEXA) 20 MG tablet, TAKE 1 TABLET ONCE DAILY. Marland Kitchen  diclofenac sodium (VOLTAREN) 1 % GEL, Apply 2 g topically 4 (four) times daily. Marland Kitchen  gabapentin (NEURONTIN) 100 MG capsule, Take 2 capsules (200 mg total) by mouth at bedtime. Marland Kitchen  nystatin-triamcinolone (MYCOLOG II) cream, Apply 1 application topically 3 (three) times daily as needed. Reported on 04/18/2015 .  pantoprazole (PROTONIX) 40 MG tablet, TAKE 1 TABLET ONCE DAILY.    Past medical history, social, surgical and family history all reviewed in electronic medical record.  No pertanent information unless stated regarding to the chief complaint.   Review of Systems:  No headache, visual changes, nausea, vomiting, diarrhea, constipation, dizziness, abdominal pain, skin rash, fevers, chills, night sweats, weight loss, swollen lymph nodes, body aches, joint swelling, muscle aches, chest pain, shortness of breath, mood changes.    Objective  There were no vitals taken for this visit. Systems examined below as of    General: No apparent distress alert and oriented x3 mood and affect normal, dressed appropriately.  HEENT: Pupils equal, extraocular movements intact  Respiratory: Patient's speak in full sentences and does not appear short of breath  Cardiovascular: No lower extremity edema, non tender, no erythema  Skin: Warm dry intact with no signs of infection or rash on extremities or on axial skeleton.  Abdomen: Soft nontender  Neuro: Cranial nerves II through XII are intact, neurovascularly intact in all extremities with 2+ DTRs and 2+ pulses.  Lymph: No lymphadenopathy of posterior or anterior cervical chain or axillae bilaterally.  Gait normal with good balance and coordination.  MSK:  Non tender with full range of motion and good stability and symmetric strength and tone of shoulders, elbows, wrist, hip, knee and ankles bilaterally.     Impression and Recommendations:     This case required medical decision making of moderate complexity. The above documentation has been reviewed and is accurate and complete Lyndal Pulley, DO       Note: This dictation was prepared with Dragon dictation along with smaller phrase technology. Any transcriptional errors that result from this process are unintentional.

## 2018-04-13 ENCOUNTER — Ambulatory Visit: Payer: PPO | Admitting: Family Medicine

## 2018-04-22 ENCOUNTER — Ambulatory Visit: Payer: PPO | Admitting: Family Medicine

## 2018-05-07 ENCOUNTER — Ambulatory Visit (INDEPENDENT_AMBULATORY_CARE_PROVIDER_SITE_OTHER): Payer: PPO | Admitting: Family Medicine

## 2018-05-07 DIAGNOSIS — M1710 Unilateral primary osteoarthritis, unspecified knee: Secondary | ICD-10-CM

## 2018-05-07 DIAGNOSIS — R2681 Unsteadiness on feet: Secondary | ICD-10-CM | POA: Diagnosis not present

## 2018-05-07 DIAGNOSIS — M5136 Other intervertebral disc degeneration, lumbar region: Secondary | ICD-10-CM

## 2018-05-07 NOTE — Progress Notes (Signed)
[facility] [place], [place] [address] Phone: [phone] Subjective:   Virtual Visit via Video Note  I connected with Crystal Carroll by a video enabled telemedicine application and verified that I am speaking with the correct person using two identifiers.  Location: Patient: home setting Provider: office setting  I discussed the limitations of evaluation and management by telemedicine and the availability of in person appointments. The patient expressed understanding and agreed to proceed.     I discussed the assessment and treatment plan with the patient. The patient was provided an opportunity to ask questions and all were answered. The patient agreed with the plan and demonstrated an understanding of the instructions.   The patient was advised to call back or seek an in-person evaluation if the symptoms worsen or if the condition fails to improve as anticipated.  I provided 25 minutes of non-face-to-face time during this encounter.   Lyndal Pulley, DO    CC: Knee and back pain follow-up  [id number]  Crystal Carroll is a 74 y.o. female coming in with complaint of knee and back pain.  Found to have patellofemoral arthritis as well as spinal stenosis.  Started on gabapentin which she is only been using intermittently.  Felt like it made her feel funny with some daydreams.  Patient stopped the medication prematurely.  Has not tried it again.  Patient did get some of the vitamin supplementations and been doing some of the exercises.  Would state approximately 20 to 25% better.  Patient still has pain on a regular basis.  Still affecting some daily activities.     Past Medical History:  Diagnosis Date  . Anxiety   . GERD (gastroesophageal reflux disease)   . Hyperlipidemia   . Hypertension    Past Surgical History:  Procedure Laterality Date  . CERVICAL LAMINECTOMY  2004   Earle Gell   Social History   Socioeconomic  History  . Marital status: Married    Spouse name: Not on file  . Number of children: 1  . Years of education: Not on file  . Highest education level: Not on file  Occupational History  . Not on file  Social Needs  . Financial resource strain: Not on file  . Food insecurity:    Worry: Not on file    Inability: Not on file  . Transportation needs:    Medical: Not on file    Non-medical: Not on file  Tobacco Use  . Smoking status: Former Smoker    Packs/day: 0.75    Years: 50.00    Pack years: 37.50    Types: Cigarettes    Last attempt to quit: 03/07/2013    Years since quitting: 5.1  . Smokeless tobacco: Never Used  Substance and Sexual Activity  . Alcohol use: Yes    Alcohol/week: 0.0 standard drinks    Comment: 2 per day  . Drug use: No  . Sexual activity: Not on file  Lifestyle  . Physical activity:    Days per week: Not on file    Minutes per session: Not on file  . Stress: Not on file  Relationships  . Social connections:    Talks on phone: Not on file    Gets together: Not on file    Attends religious service: Not on file    Active member of club or organization: Not on file    Attends meetings of clubs or organizations: Not on  file    Relationship status: Not on file  Other Topics Concern  . Not on file  Social History Narrative   Married to [facility] (patient) in 1981. 2nd marriage-1 child with 1 adopted grandchild.       Retired as Statistician.       Hobbies: antiques, former Firefighter      Exercise: none currently.          Allergies  Allergen Reactions  . Codeine Nausea Only  . Sulfamethoxazole     REACTION: GI upset   Family History  Problem Relation Age of Onset  . Heart attack Father 29       smoker, rheumatic fever as child  . Melanoma Mother        labia    Current Outpatient Medications (Endocrine & Metabolic):  .  alendronate (FOSAMAX) 70 MG tablet, Take 1 tablet (70 mg total) by mouth once a week. Take with a  full glass of water on an empty stomach.  Current Outpatient Medications (Cardiovascular):  .  rosuvastatin (CRESTOR) 40 MG tablet, TAKE 1 TABLET ONCE DAILY.     Current Outpatient Medications (Other):  Marland Kitchen  ALPRAZolam (XANAX) 0.25 MG tablet, TAKE (1) TABLET DAILY AS NEEDED. Marland Kitchen  buPROPion (WELLBUTRIN XL) 300 MG 24 hr tablet, TAKE 1 TABLET ONCE DAILY. .  citalopram (CELEXA) 20 MG tablet, TAKE 1 TABLET ONCE DAILY. Marland Kitchen  diclofenac sodium (VOLTAREN) 1 % GEL, Apply 2 g topically 4 (four) times daily. Marland Kitchen  gabapentin (NEURONTIN) 100 MG capsule, Take 2 capsules (200 mg total) by mouth at bedtime. Marland Kitchen  nystatin-triamcinolone (MYCOLOG II) cream, Apply 1 application topically 3 (three) times daily as needed. Reported on 04/18/2015 .  pantoprazole (PROTONIX) 40 MG tablet, TAKE 1 TABLET ONCE DAILY.    Past medical history, social, surgical and family history all reviewed in electronic medical record.  No pertanent information unless stated regarding to the chief complaint.   Review of Systems:  No headache, visual changes, nausea, vomiting, diarrhea, constipation, dizziness, abdominal pain, skin rash, fevers, chills, night sweats, weight loss, swollen lymph nodes, body aches, joint swelling, , chest pain, shortness of breath, mood changes.  Muscle aches positive  Objective     General: No apparent distress alert and oriented x3 mood and affect normal, dressed appropriately.      Impression and Recommendations:     This case required medical decision making of moderate complexity. The above documentation has been reviewed and is accurate and complete Lyndal Pulley, DO       Note: This dictation was prepared with Dragon dictation along with smaller phrase technology. Any transcriptional errors that result from this process are unintentional.

## 2018-05-08 ENCOUNTER — Encounter: Payer: Self-pay | Admitting: Family Medicine

## 2018-05-08 NOTE — Assessment & Plan Note (Signed)
Still likely lumbar spinal stenosis.  Encouraged her on core strengthening and trying the gabapentin on a more regular basis.  Patient states that she would like to do this.  We will see how patient responds.  Follow-up again 4 weeks

## 2018-05-08 NOTE — Assessment & Plan Note (Signed)
Having difficulty with this.  Would like her to start possible formal physical therapy for balance and coordination.  We will put the order in.  Secondary to corona outbreak though on able to likely start in the near future.  Patient understands this.  Has some home exercises and would encourage her to do them.

## 2018-05-08 NOTE — Assessment & Plan Note (Signed)
Stable.  Patient has not been completely compliant with medications of the exercises.  We discussed with patient in great length.  We discussed the idea of potential injections which patient wants to hold at the moment.  Patient will continue with conservative therapy at the moment.  We will follow-up again in 6 weeks

## 2018-05-22 ENCOUNTER — Ambulatory Visit (INDEPENDENT_AMBULATORY_CARE_PROVIDER_SITE_OTHER): Payer: PPO | Admitting: Family Medicine

## 2018-05-22 ENCOUNTER — Encounter: Payer: Self-pay | Admitting: Family Medicine

## 2018-05-22 ENCOUNTER — Other Ambulatory Visit: Payer: Self-pay

## 2018-05-22 VITALS — BP 122/64 | HR 64 | Temp 97.5°F | Ht 64.0 in | Wt 151.0 lb

## 2018-05-22 DIAGNOSIS — R2681 Unsteadiness on feet: Secondary | ICD-10-CM | POA: Diagnosis not present

## 2018-05-22 DIAGNOSIS — G3281 Cerebellar ataxia in diseases classified elsewhere: Secondary | ICD-10-CM | POA: Diagnosis not present

## 2018-05-22 DIAGNOSIS — R002 Palpitations: Secondary | ICD-10-CM | POA: Diagnosis not present

## 2018-05-22 DIAGNOSIS — E785 Hyperlipidemia, unspecified: Secondary | ICD-10-CM | POA: Diagnosis not present

## 2018-05-22 DIAGNOSIS — E538 Deficiency of other specified B group vitamins: Secondary | ICD-10-CM | POA: Diagnosis not present

## 2018-05-22 NOTE — Progress Notes (Signed)
Phone [phone]   Subjective:  Crystal Carroll is a 73 y.o. year old very pleasant female patient who presents for/with See problem oriented charting ROS- weakness sensation in legs without falls. Balance issues. No fever, chills, cough, shortness of breath, body aches, sore throat, or loss of taste or smell   Past Medical History-  Patient Active Problem List   Diagnosis Date Noted  . Caregiver burden 04/03/2016    Priority: High  . Aortic atherosclerosis ([place]) 06/12/2017    Priority: Medium  . Vitamin B12 deficiency 05/12/2017    Priority: Medium  . Cervical radiculopathy 04/03/2016    Priority: Medium  . Essential tremor 04/18/2015    Priority: Medium  . Heavy alcohol use 04/14/2014    Priority: Medium  . CKD (chronic kidney disease), stage III ([place]) 10/15/2013    Priority: Medium  . Hypercalcemia 01/28/2007    Priority: Medium  . TOBACCO USE 12/24/2006    Priority: Medium  . Hyperlipidemia 05/26/2006    Priority: Medium  . Anxiety state 05/26/2006    Priority: Medium  . Essential hypertension 05/26/2006    Priority: Medium  . Patellofemoral arthritis 03/05/2018    Priority: Low  . Adenomatous colon polyp 06/14/2014    Priority: Low  . H/O cold sores 10/15/2013    Priority: Low  . GERD 05/26/2006    Priority: Low  . Osteoporosis 03/13/2018  . DDD (degenerative disc disease), lumbar 03/05/2018  . Unstable gait 01/20/2018  . Palpitations 05/12/2017  . Diarrhea 04/14/2014    Medications- reviewed and updated Current Outpatient Medications  Medication Sig Dispense Refill  . alendronate (FOSAMAX) 70 MG tablet Take 1 tablet (70 mg total) by mouth once a week. Take with a full glass of water on an empty stomach. 5 tablet 11  . ALPRAZolam (XANAX) 0.25 MG tablet TAKE (1) TABLET DAILY AS NEEDED. 30 tablet 0  . buPROPion (WELLBUTRIN XL) 300 MG 24 hr tablet TAKE 1 TABLET ONCE DAILY. 90 tablet 1  . cholecalciferol (VITAMIN D3) 25 MCG (1000 UT) tablet Take 1,000 Units  by mouth daily.    . citalopram (CELEXA) 20 MG tablet TAKE 1 TABLET ONCE DAILY. 90 tablet 1  . diclofenac sodium (VOLTAREN) 1 % GEL Apply 2 g topically 4 (four) times daily. 100 g 3  . gabapentin (NEURONTIN) 100 MG capsule Take 2 capsules (200 mg total) by mouth at bedtime. 60 capsule 3  . Misc Natural Products (TART CHERRY ADVANCED PO) Take by mouth.    . nystatin-triamcinolone (MYCOLOG II) cream Apply 1 application topically 3 (three) times daily as needed. Reported on 04/18/2015    . pantoprazole (PROTONIX) 40 MG tablet TAKE 1 TABLET ONCE DAILY. 90 tablet 1  . rosuvastatin (CRESTOR) 40 MG tablet TAKE 1 TABLET ONCE DAILY. 90 tablet 1   No current facility-administered medications for this visit.      Objective:  BP 122/64 (BP Location: Left Arm, Patient Position: Sitting, Cuff Size: Normal)   Pulse 64   Temp (!) 97.5 F (36.4 C) (Oral)   Ht 5\' 4"  (1.626 m)   Wt 151 lb (68.5 kg)   LMP  (LMP Unknown)   SpO2 97%   BMI 25.92 kg/m  Gen: NAD, resting comfortably CV: RRR no murmurs rubs or gallops Lungs: CTAB no crackles, wheeze, rhonchi Abdomen: soft/nontender/nondistended Ext: no edema Skin: warm, dry Neuro: mild instability with turns- left foot seems to hit down hard at times with walking and turns slightly outward MSK: pain with empty can and  with abduction and forward flexion of shoulder    Assessment and Plan   # Gait instability # Palpitations S: patient seen in January for unstable gait- did stat head CT to rule out subdural hematoma or obvious stroke- none was detected thankfully. A tthat time she had mild right lower extremity weakness and some numbness in right lower leg. Patient was treated with prednisone and had significant improvement- thought symptoms potentially related to undiagnosed previously lumbar stenosis. She has had issues with intermittent back pain- continued to have that even after prednisone and we referred to Dr. Charlann Boxer of sports medicine. Dr. Tamala Julian  did x-rays of spine and knees- recommended home exercises (also having knee pain) and advised formal PT in late February. PT has been pushed back due to covid 19. Had follow up video visit with Dr. Tamala Julian on April 30th- still thought to have lumbar spinal stenosis as cause of back pain and leg issues- cores trengthening and gabapentin advised. Still advised to get into PT  Today, patient states overall gait improving -some right shoulder pain from trying to pull on compression stockings -can get pulsating/twitching feeling in right upper arm and chest- may be related to above A/P: Gait instability has improved after prednisone and with time but has not resolved. Concern for gait instability/ataxia- will get MRI brain to further evaluate infarct/mass- particularly cerrebellar. She states loses her balance just standing still but not sure if its her back/knee - would also consider neuro consult.  - for palpitations in right chest- initially seemed like muscular spasm related to shoulder issues from putting on compression stockings. We will do a cardiac monitor for 7 days to further evaluate. No chest pain or shortness of breath at present  Future Appointments  Date Time Provider [place]  06/18/2018  2:20 PM Marin Olp, MD [facility]   cpe in June- update labs at this time  Lab/Order associations: Hyperlipidemia, unspecified hyperlipidemia type - Plan: Lipid panel, Comprehensive metabolic panel, CBC, CBC, Comprehensive metabolic panel, Lipid panel, CANCELED: CBC, CANCELED: Comprehensive metabolic panel, CANCELED: Lipid panel, CANCELED: CBC, CANCELED: Comprehensive metabolic panel, CANCELED: Lipid panel -.Stable. Continue current medications.   Vitamin B12 deficiency - Plan: Vitamin B12, Vitamin B12, CANCELED: Vitamin B12, CANCELED: Vitamin B12  Cerebellar ataxia in diseases classified elsewhere [facility]) - Plan: MR Brain W Wo Contrast, CANCELED: MR Brain W Wo Contrast  Gait instability  - Plan: MR Brain W Wo Contrast, CANCELED: MR Brain W Wo Contrast  Palpitations - Plan: Cardiac event monitor  Return precautions advised.  [name], MD

## 2018-05-22 NOTE — Patient Instructions (Addendum)
Health Maintenance Due  Topic Date Due  . MAMMOGRAM - pushed back due to covid? 05/11/2018   Glad you are doing better- hope things get even better as you get back into PT  b12 levels have been ok -dont see vitamins he listed Since you have a history of this being low and you are not sure if you are taking it right now- lets repeat bloodwork  Please stop by lab before you go If you do not have mychart- we will call you about results within 5 business days of [place] receiving them.  If you have mychart- we will send your results within 3 business days of [place] receiving them.  If abnormal or we want to clarify a result, we will call or mychart you to make sure you receive the message.  If you have questions or concerns or don't hear within 5-7 days, please send [place] a message or call us.

## 2018-05-23 LAB — COMPREHENSIVE METABOLIC PANEL
AG Ratio: 2 (calc) (ref 1.0–2.5)
ALT: 9 U/L (ref 6–29)
AST: 12 U/L (ref 10–35)
Albumin: 4.5 g/dL (ref 3.6–5.1)
Alkaline phosphatase (APISO): 75 U/L (ref 37–153)
BUN/Creatinine Ratio: 12 (calc) (ref 6–22)
BUN: 18 mg/dL (ref 7–25)
CO2: 23 mmol/L (ref 20–32)
Calcium: 10.8 mg/dL — ABNORMAL HIGH (ref 8.6–10.4)
Chloride: 105 mmol/L (ref 98–110)
Creat: 1.5 mg/dL — ABNORMAL HIGH (ref 0.60–0.93)
Globulin: 2.3 g/dL (calc) (ref 1.9–3.7)
Glucose, Bld: 84 mg/dL (ref 65–99)
Potassium: 3.8 mmol/L (ref 3.5–5.3)
Sodium: 138 mmol/L (ref 135–146)
Total Bilirubin: 0.5 mg/dL (ref 0.2–1.2)
Total Protein: 6.8 g/dL (ref 6.1–8.1)

## 2018-05-23 LAB — CBC
HCT: 39.4 % (ref 35.0–45.0)
Hemoglobin: 13.8 g/dL (ref 11.7–15.5)
MCH: 30 pg (ref 27.0–33.0)
MCHC: 35 g/dL (ref 32.0–36.0)
MCV: 85.7 fL (ref 80.0–100.0)
MPV: 10.9 fL (ref 7.5–12.5)
Platelets: 219 10*3/uL (ref 140–400)
RBC: 4.6 10*6/uL (ref 3.80–5.10)
RDW: 13.4 % (ref 11.0–15.0)
WBC: 7.2 10*3/uL (ref 3.8–10.8)

## 2018-05-23 LAB — VITAMIN B12: Vitamin B-12: 789 pg/mL (ref 200–1100)

## 2018-05-23 LAB — LIPID PANEL
Cholesterol: 153 mg/dL (ref <200)
HDL: 72 mg/dL (ref >=50)
LDL Cholesterol (Calc): 63 mg/dL (calc)
Non-HDL Cholesterol (Calc): 81 mg/dL (calc) (ref <130)
Total CHOL/HDL Ratio: 2.1 (calc) (ref <5.0)
Triglycerides: 92 mg/dL (ref <150)

## 2018-05-23 NOTE — Assessment & Plan Note (Signed)
S: b12 fine in December and fine again today. Has received b12 injections previously now on no medication Lab Results  Component Value Date   VITAMINB12 789 05/22/2018  A/P: checked b12 with gait issues to make sure not related- levels are adequately repleted. Did advise 250 mcg daily to help keep these levels. Up.

## 2018-05-26 ENCOUNTER — Telehealth: Payer: Self-pay | Admitting: *Deleted

## 2018-05-26 NOTE — Telephone Encounter (Signed)
[employer] to ship a 30 day cardiac event monitor to her home.  Instructions reviewed briefly as they are included in the monitor kit.

## 2018-05-29 ENCOUNTER — Ambulatory Visit (HOSPITAL_COMMUNITY)
Admission: RE | Admit: 2018-05-29 | Discharge: 2018-05-29 | Disposition: A | Discharge status: Home / Self Care | Payer: PPO | Source: Ambulatory Visit | Attending: Family Medicine | Admitting: Family Medicine

## 2018-05-29 ENCOUNTER — Other Ambulatory Visit: Payer: Self-pay

## 2018-05-29 DIAGNOSIS — G3281 Cerebellar ataxia in diseases classified elsewhere: Secondary | ICD-10-CM | POA: Diagnosis not present

## 2018-05-29 DIAGNOSIS — R2681 Unsteadiness on feet: Secondary | ICD-10-CM | POA: Insufficient documentation

## 2018-05-29 DIAGNOSIS — R270 Ataxia, unspecified: Secondary | ICD-10-CM | POA: Diagnosis not present

## 2018-05-29 MED ORDER — GADOBUTROL 1 MMOL/ML IV SOLN
6.0000 mL | Freq: Once | INTRAVENOUS | Status: AC | PRN
  Administered 2018-05-29: 6 mL via INTRAVENOUS

## 2018-06-15 NOTE — Telephone Encounter (Signed)
See note

## 2018-06-15 NOTE — Telephone Encounter (Signed)
Shelly- can you guide patient a little further? We are unfamiliar on guiding patients on this. Or is there someone else who can?

## 2018-06-15 NOTE — Telephone Encounter (Signed)
Please advise as I am unfamiliar on instructions

## 2018-06-15 NOTE — Telephone Encounter (Signed)
PT RETURNED FOLLOW UP CALL HER PHONE # ARE CURRENT AND SHE HAS BEEN STRESSING A BIT WITH A SICK HUSBAND AND WOULD WELCOME A CALL FOR SOMEONE TO GIVE GUIDANCE ON THIS MONITOR [phone]

## 2018-06-16 ENCOUNTER — Telehealth: Payer: Self-pay | Admitting: *Deleted

## 2018-06-16 NOTE — Telephone Encounter (Signed)
    COVID-19 Pre-Screening Questions:  . In the past 7 to 10 days have you had a cough,  shortness of breath, headache, congestion, fever (100 or greater) body aches, chills, sore throat, or sudden loss of taste or sense of smell? . Have you been around anyone with known Covid 19. . Have you been around anyone who is awaiting Covid 19 test results in the past 7 to 10 days? . Have you been around anyone who has been exposed to Covid 19, or has mentioned symptoms of Covid 19 within the past 7 to 10 days?  If you have any concerns/questions about symptoms patients report during screening (either on the phone or at threshold). Contact the provider seeing the patient or DOD for further guidance.  If neither are available contact a member of the leadership team.   Patient answered no to all the above questions.  She is aware not to arrive any sooner than 15 minutes prior to her appointment time.

## 2018-06-16 NOTE — Telephone Encounter (Signed)
Patient scheduled to come into office 06/18/2018 to have her monitor applied.

## 2018-06-18 ENCOUNTER — Other Ambulatory Visit: Payer: Self-pay

## 2018-06-18 ENCOUNTER — Encounter: Payer: Self-pay | Admitting: Family Medicine

## 2018-06-18 ENCOUNTER — Ambulatory Visit (INDEPENDENT_AMBULATORY_CARE_PROVIDER_SITE_OTHER): Payer: PPO

## 2018-06-18 ENCOUNTER — Ambulatory Visit (INDEPENDENT_AMBULATORY_CARE_PROVIDER_SITE_OTHER): Payer: PPO | Admitting: Family Medicine

## 2018-06-18 VITALS — BP 110/64 | HR 69 | Temp 98.4°F | Ht 64.0 in | Wt 149.0 lb

## 2018-06-18 DIAGNOSIS — R2681 Unsteadiness on feet: Secondary | ICD-10-CM

## 2018-06-18 DIAGNOSIS — E785 Hyperlipidemia, unspecified: Secondary | ICD-10-CM | POA: Diagnosis not present

## 2018-06-18 DIAGNOSIS — N183 Chronic kidney disease, stage 3 unspecified: Secondary | ICD-10-CM

## 2018-06-18 DIAGNOSIS — Z8673 Personal history of transient ischemic attack (TIA), and cerebral infarction without residual deficits: Secondary | ICD-10-CM

## 2018-06-18 DIAGNOSIS — I10 Essential (primary) hypertension: Secondary | ICD-10-CM | POA: Diagnosis not present

## 2018-06-18 DIAGNOSIS — R358 Other polyuria: Secondary | ICD-10-CM | POA: Diagnosis not present

## 2018-06-18 DIAGNOSIS — K219 Gastro-esophageal reflux disease without esophagitis: Secondary | ICD-10-CM

## 2018-06-18 DIAGNOSIS — Z0000 Encounter for general adult medical examination without abnormal findings: Secondary | ICD-10-CM | POA: Diagnosis not present

## 2018-06-18 DIAGNOSIS — I700 Atherosclerosis of aorta: Secondary | ICD-10-CM

## 2018-06-18 DIAGNOSIS — R3589 Other polyuria: Secondary | ICD-10-CM

## 2018-06-18 DIAGNOSIS — R002 Palpitations: Secondary | ICD-10-CM | POA: Diagnosis not present

## 2018-06-18 LAB — POC URINALSYSI DIPSTICK (AUTOMATED)
Bilirubin, UA: NEGATIVE
Blood, UA: NEGATIVE
Glucose, UA: NEGATIVE
Ketones, UA: NEGATIVE
Leukocytes, UA: NEGATIVE
Nitrite, UA: NEGATIVE
Protein, UA: POSITIVE — AB
Spec Grav, UA: >=1.03 — AB (ref 1.010–1.025)
Urobilinogen, UA: 1 E.U./dL
pH, UA: 6 (ref 5.0–8.0)

## 2018-06-18 MED ORDER — PANTOPRAZOLE SODIUM 20 MG PO TBEC: 20.0000 mg | DELAYED_RELEASE_TABLET | Freq: Every day | ORAL | 3 refills | Status: DC

## 2018-06-18 NOTE — Patient Instructions (Addendum)
Team- please get a copy of her mammogram from [place]. She is going to get another one and asked her to have them send [place] a copy of that report.   Call [place] [facility] as you are due for your yearly ct scan  Defer shingrix this year due to overlap of side effects with covid 19 symptoms  Consider online support group for parkinsons care partners  I think counseling would be a great idea for you- see handout   Please stop by lab before you go If you do not have mychart- we will call you about results within 5 business days of [place] receiving them.  If you have mychart- we will send your results within 3 business days of [place] receiving them.  If abnormal or we want to clarify a result, we will call or mychart you to make sure you receive the message.  If you have questions or concerns or don't hear within 5-7 days, please send [place] a message or call us.

## 2018-06-18 NOTE — Assessment & Plan Note (Signed)
#  gait instability- improved after prednisone and with time but has noo fully resolved. Saw Dr. Tamala Julian of sports medicine as initially seemed to be related to back pain- some concern for spinal stenosis. Patient was to start PT- Starts PT next week- feels like knee is really bothering her. Advised follow up with Dr. Tamala Julian if not seeing improvement with PT   MRI brain was done after may visit to rule out stroke/mass and none seen.   aspirin 81 mg restarted after MRI finding "chronic lacunar infarct noted in the left frontal periventricular white matter"  The more I think about her case and continued stability I think I would like neurology to weigh in- no stroke was seen on CT at initial time of gait instability but was that due to it being too small? Is it possible small stroke is cause of symptoms- I would like their expert opinion. Is there another neurological condition that I have not thought about yet that could be causing her issues- referral placed to neurology today

## 2018-06-18 NOTE — Progress Notes (Signed)
Phone: [phone]   Subjective:  Patient presents today for their annual physical. Chief complaint-noted.   See problem oriented charting- ROS- full  review of systems was completed and negative except for: urinary frequency, joint pain, gait problem, muscle aches, anxiety, lightheaded/dizzy/seasonal allergies  The following were reviewed and entered/updated in epic: Past Medical History:  Diagnosis Date  . Anxiety   . GERD (gastroesophageal reflux disease)   . Hyperlipidemia   . Hypertension    Patient Active Problem List   Diagnosis Date Noted  . History of lacunar cerebrovascular accident (CVA) 06/18/2018    Priority: High  . Caregiver burden 04/03/2016    Priority: High  . Aortic atherosclerosis ([place]) 06/12/2017    Priority: Medium  . Vitamin B12 deficiency 05/12/2017    Priority: Medium  . Cervical radiculopathy 04/03/2016    Priority: Medium  . Essential tremor 04/18/2015    Priority: Medium  . Heavy alcohol use 04/14/2014    Priority: Medium  . CKD (chronic kidney disease), stage III ([place]) 10/15/2013    Priority: Medium  . Hypercalcemia 01/28/2007    Priority: Medium  . TOBACCO USE 12/24/2006    Priority: Medium  . Hyperlipidemia 05/26/2006    Priority: Medium  . Anxiety state 05/26/2006    Priority: Medium  . Essential hypertension 05/26/2006    Priority: Medium  . Patellofemoral arthritis 03/05/2018    Priority: Low  . Adenomatous colon polyp 06/14/2014    Priority: Low  . H/O cold sores 10/15/2013    Priority: Low  . GERD 05/26/2006    Priority: Low  . Osteoporosis 03/13/2018  . DDD (degenerative disc disease), lumbar 03/05/2018  . Unstable gait 01/20/2018  . Palpitations 05/12/2017  . Diarrhea 04/14/2014   Past Surgical History:  Procedure Laterality Date  . CERVICAL LAMINECTOMY  2004   Earle Gell    Family History  Problem Relation Age of Onset  . Heart attack Father 3       smoker, rheumatic fever as child  . Melanoma Mother       labia    Medications- reviewed and updated Current Outpatient Medications  Medication Sig Dispense Refill  . alendronate (FOSAMAX) 70 MG tablet Take 1 tablet (70 mg total) by mouth once a week. Take with a full glass of water on an empty stomach. 5 tablet 11  . ALPRAZolam (XANAX) 0.25 MG tablet TAKE (1) TABLET DAILY AS NEEDED. 30 tablet 0  . aspirin EC 81 MG tablet Take 81 mg by mouth daily.    Marland Kitchen buPROPion (WELLBUTRIN XL) 300 MG 24 hr tablet TAKE 1 TABLET ONCE DAILY. 90 tablet 1  . cholecalciferol (VITAMIN D3) 25 MCG (1000 UT) tablet Take 1,000 Units by mouth daily.    . citalopram (CELEXA) 20 MG tablet TAKE 1 TABLET ONCE DAILY. 90 tablet 1  . Cyanocobalamin (VITAMIN B-12 PO) Take by mouth.    . diclofenac sodium (VOLTAREN) 1 % GEL Apply 2 g topically 4 (four) times daily. 100 g 3  . gabapentin (NEURONTIN) 100 MG capsule Take 2 capsules (200 mg total) by mouth at bedtime. (Patient taking differently: Take 100 mg by mouth at bedtime. ) 60 capsule 3  . Misc Natural Products (TART CHERRY ADVANCED PO) Take by mouth.    . nystatin-triamcinolone (MYCOLOG II) cream Apply 1 application topically 3 (three) times daily as needed. Reported on 04/18/2015    . pantoprazole (PROTONIX) 20 MG tablet Take 1 tablet (20 mg total) by mouth daily. 90 tablet 3  . rosuvastatin (  CRESTOR) 40 MG tablet TAKE 1 TABLET ONCE DAILY. 90 tablet 1  . TURMERIC PO Take by mouth.     No current facility-administered medications for this visit.     Allergies-reviewed and updated Allergies  Allergen Reactions  . Codeine Nausea Only  . Sulfamethoxazole     REACTION: GI upset    Social History   Social History Narrative   Married to [employer] (patient) in 1981. 2nd marriage-1 child with 1 adopted grandchild.       Retired as Statistician.       Hobbies: antiques, former Firefighter      Exercise: none currently.          Objective  Objective:  BP 110/64 (BP Location: Left Arm, Patient Position:  Sitting, Cuff Size: Normal)   Pulse 69   Temp 98.4 F (36.9 C)   Ht 5\' 4"  (1.626 m)   Wt 149 lb (67.6 kg)   LMP  (LMP Unknown)   SpO2 97%   BMI 25.58 kg/m  Gen: NAD, resting comfortably HEENT: Mucous membranes are moist. Oropharynx normal Neck: no thyromegaly CV: RRR no murmurs rubs or gallops Lungs: CTAB no crackles, wheeze, rhonchi Abdomen: soft/nontender/nondistended/normal bowel sounds. No rebound or guarding.  Ext: no edema, bilateral knee pain with palpation Skin: warm, dry Neuro: gait imbalance noted- appears stable- pain seems to startle her with knees and throws her off. Good strength lower extremities 5/5 against my resistance.    Assessment and Plan   73 y.o. female presenting for annual physical.  Health Maintenance counseling: 1. Anticipatory guidance: Patient counseled regarding regular dental exams q6 months, eye exams ,  avoiding smoking and second hand smoke , limiting alcohol to 1 beverage per day- she is down to 1 a day and congratulated her for keeping this lower level intake .   2. Risk factor reduction:  Advised patient of need for regular exercise and diet rich and fruits and vegetables to reduce risk of heart attack and stroke. Exercise- states not doing well due to knee pain. Diet-trying to eat a healthy diet- has had periods In her life with stress that eats less and she is currently in that phase.  Wt Readings from Last 3 Encounters:  06/18/18 149 lb (67.6 kg)  05/22/18 151 lb (68.5 kg)  03/13/18 151 lb 3.2 oz (68.6 kg)  3. Immunizations/screenings/ancillary studies- Defer shingrix this year due to overlap of side effects with covid 19 symptoms  Immunization History  Administered Date(s) Administered  . Influenza Split 12/03/2010, 12/09/2011  . Influenza Whole 10/18/2009  . Influenza, High Dose Seasonal PF 11/02/2012, 10/18/2015, 10/21/2016, 10/21/2017  . Influenza,inj,Quad PF,6+ Mos 10/15/2013, 12/06/2014  . Influenza-Unspecified 10/08/2015  .  Pneumococcal Conjugate-13 04/15/2014  . Pneumococcal Polysaccharide-23 12/03/2010  . Td 01/07/1998, 10/18/2009  . Tdap 10/18/2016  . Zoster 10/18/2009  4. Cervical cancer screening- she has passed age to based screening recommendations.  She still follows with gynecology.  Denies history of abnormal Pap smear. 5. Breast cancer screening-  breast exam with gynecology and mammogram -last 05/10/2016 on record.   6. Colon cancer screening - May 2016 with 5-year repeat due to adenomatous colon polyp history 7. Skin cancer screening-sees Dr. Fontaine No. advised regular sunscreen use. Denies worrisome, changing, or new skin lesions.  8. Birth control/STD check-postmenopausal and monogamous 9. Osteoporosis screening at 64-see discussion below 10. former smoker- over 30 pack years and quit in 2015. Advised lung cancer screen program to be continued- due soon . Will  get UA  Status of chronic or acute concerns      #gait instability/History of lacunar cerebrovascular accident (CVA) #gait instability- improved after prednisone and with time but has noo fully resolved. Saw Dr. Tamala Julian of sports medicine as initially seemed to be related to back pain- some concern for spinal stenosis. Patient was to start PT- Starts PT next week- feels like knee is really bothering her. Advised follow up with Dr. Tamala Julian if not seeing improvement with PT   MRI brain was done after may visit to rule out stroke/mass and none seen.   aspirin 81 mg restarted after MRI finding "chronic lacunar infarct noted in the left frontal periventricular white matter"  The more I think about her case and continued stability I think I would like neurology to weigh in- no stroke was seen on CT at initial time of gait instability but was that due to it being too small? Is it possible small stroke is cause of symptoms- I would like their expert opinion. Is there another neurological condition that I have not thought about yet that could be causing  her issues- referral placed to neurology today     #urinary frequency- will get UA and culture  #was having palpitations last month as well- 7 day cardiac monitor ordered - results still pending- wearing monitor right now  #hypertension/CKD III- BP at goal despite being off lisinopril. GFR has been stable in 40s recently- had been as low as 30s before  # osteoporosis- vit d/fosamax- due 2021 for repeat - she is off calcium due to high levels on last check. We are going to further investigate. May consider endocrine referral if calcium remains high and off calcium - yet typically would need this for fosamax use. Thinks off calcium for years (I thought we restarted with fosamax)  # hyperlipidemia/aortic atherosclerosis- lipids recently well controlled on rosuvastatin 40mg . Aortic athero noted on CT 06/2017- likely stable. Continue risk factor modification  #gerd- compliant with protonix 40mg . Reflux controlled.  - we will trial 20mg  dose and if effective by next visit trial pepcid  #anxiety/stress/caregiver burden- compliant with celexa 20mg  and wellbutrin 300mg  XR. Sparing xanax for anxiety. Advised patient add counseling with visit to [place] [facility]  Future Appointments  Date Time Provider [place]  06/24/2018  2:15 PM Hosie Spangle [facility]  12/15/2018  1:00 PM Marin Olp, MD [facility]   Return in about 6 months (around 12/18/2018) for follow up- or sooner if needed.  Lab/Order associations:    ICD-10-CM   1. Preventative health care  Z00.00   2. Hyperlipidemia, unspecified hyperlipidemia type  E78.5   3. Essential hypertension  I10   4. CKD (chronic kidney disease), stage III (H[facility])  N18.3 POCT Urinalysis Dipstick (Automated)  5. Aortic atherosclerosis ([facility])  I70.0   6. Hypercalcemia  D53.29 Basic metabolic panel    Calcium, ionized    PTH, Intact and Calcium  7. Gastroesophageal reflux disease without esophagitis  K21.9   8. Polyuria   R35.8 Urine Culture  9. History of lacunar cerebrovascular accident (CVA)  Z86.73 Ambulatory referral to Neurology  10. Unstable gait  R26.81 Ambulatory referral to Neurology    Meds ordered this encounter  Medications  . pantoprazole (PROTONIX) 20 MG tablet    Sig: Take 1 tablet (20 mg total) by mouth daily.    Dispense:  90 tablet    Refill:  3    Return precautions advised.  [name], MD

## 2018-06-18 NOTE — Assessment & Plan Note (Signed)
#  gerd- compliant with protonix 40mg . Reflux controlled.  - we will trial 20mg  dose and if effective by next visit trial pepcid

## 2018-06-19 ENCOUNTER — Encounter: Payer: Self-pay | Admitting: Neurology

## 2018-06-19 LAB — BASIC METABOLIC PANEL
BUN: 16 mg/dL (ref 6–23)
CO2: 27 mEq/L (ref 19–32)
Calcium: 11 mg/dL — ABNORMAL HIGH (ref 8.4–10.5)
Chloride: 103 mEq/L (ref 96–112)
Creatinine, Ser: 1.29 mg/dL — ABNORMAL HIGH (ref 0.40–1.20)
GFR: 40.43 mL/min — ABNORMAL LOW (ref >60.00)
Glucose, Bld: 85 mg/dL (ref 70–99)
Potassium: 3.6 mEq/L (ref 3.5–5.1)
Sodium: 140 mEq/L (ref 135–145)

## 2018-06-19 LAB — PTH, INTACT AND CALCIUM
Calcium: 10.9 mg/dL — ABNORMAL HIGH (ref 8.6–10.4)
PTH: 45 pg/mL (ref 14–64)

## 2018-06-19 LAB — EXTRA SPECIMEN

## 2018-06-19 LAB — URINE CULTURE
MICRO NUMBER:: 560851
SPECIMEN QUALITY:: ADEQUATE

## 2018-06-19 LAB — CALCIUM, IONIZED: Calcium, Ion: 5.89 mg/dL — ABNORMAL HIGH (ref 4.8–5.6)

## 2018-06-24 ENCOUNTER — Encounter: Payer: Self-pay | Admitting: Physical Therapy

## 2018-06-24 ENCOUNTER — Ambulatory Visit: Payer: PPO | Attending: Family Medicine | Admitting: Physical Therapy

## 2018-06-24 ENCOUNTER — Other Ambulatory Visit: Payer: Self-pay

## 2018-06-24 DIAGNOSIS — M545 Low back pain, unspecified: Secondary | ICD-10-CM

## 2018-06-24 DIAGNOSIS — R262 Difficulty in walking, not elsewhere classified: Secondary | ICD-10-CM

## 2018-06-24 NOTE — Therapy (Signed)
[place], [place], [address] Phone: [phone]   Fax:  [phone]  Physical Therapy Evaluation  Patient Details  Name: Crystal Carroll MRN: [id number] Date of Birth: [birth year] Referring Provider (PT): Hulan Saas, DO   Encounter Date: 06/24/2018  PT End of Session - 06/24/18 1536    Visit Number  1    Number of Visits  12    Date for PT Re-Evaluation  08/05/18    Authorization Type  [employer], Progress note by visit 10 and add KX at 41    PT Start Time  1413    PT Stop Time  1507    PT Time Calculation (min)  54 min    Activity Tolerance  Patient tolerated treatment well    Behavior During Therapy  [facility] for tasks assessed/performed       Past Medical History:  Diagnosis Date  . Anxiety   . GERD (gastroesophageal reflux disease)   . Hyperlipidemia   . Hypertension     Past Surgical History:  Procedure Laterality Date  . CERVICAL LAMINECTOMY  2004   Earle Gell    There were no vitals filed for this visit.   Subjective Assessment - 06/24/18 1433    Subjective  Pt. is a 73 y/o female referred to PT for c/o LBP with associated gait/balance complaints and leg pain. Gait difficulties have been ongoing for some time but pt. reports change in status with new onset x 1 month ago with back/leg pain associated with activities while caregiving for her husband who has Parkinson's (assisting with transfers, donning TED hose). Pain in lumbar spine with transfers but pt. noting pain extending into right>left thigh and knee particularlywith stairs.    Pertinent History  cervical fusion, gait/balance difficulties, anxiety    Limitations  Standing;Walking;Lifting;House hold activities    How long can you sit comfortably?  depends on position but no significant limitations with sitting tolerance    How long can you stand comfortably?  1 hour    How long can you walk comfortably?  varies  depending on the day    Diagnostic tests  X-rays    Patient Stated Goals  Improve strength    Currently in Pain?  Yes    Pain Score  5     Pain Location  Leg    Pain Orientation  Right;Left    Pain Type  Acute pain    Pain Radiating Towards  right>left thigh    Pain Onset  More than a month ago    Pain Frequency  Intermittent    Aggravating Factors   stair navigation, sit>stand    Pain Relieving Factors  rest    Effect of Pain on Daily Activities  difficulty with stairs and sit<>stand transfers         [facility] PT Assessment - 06/24/18 0001      Assessment   Medical Diagnosis  LBP    Referring Provider (PT)  Hulan Saas, DO    Onset Date/Surgical Date  05/24/18    Hand Dominance  Right    Prior Therapy  none for current episode      Precautions   Precaution Comments  fall risk, wearing heart monitor July 11th      Restrictions   Weight Bearing Restrictions  No      Balance Screen   Has the patient fallen in the past 6 months  Yes    How many times?  [place] residence    Living Arrangements  Spouse/significant other    Home Access  Stairs to enter    Entrance Stairs-Number of Steps  3    Entrance Stairs-Rails  --   rail for 3 steps at garage but not for 3 steps in front   [place]  Two level    Alternate Level Stairs-Number of Steps  --   1 flight   [place] - single point    Additional Comments  Lives with spouse who has Parkinson's/helps as caregiver      Prior Function   Level of Independence  Independent with basic ADLs      Cognition   Overall Cognitive Status  Within Functional Limits for tasks assessed      Observation/Other Assessments   Focus on Therapeutic Outcomes (FOTO)   56% limited      Sensation   Light Touch  Appears Intact      ROM / Strength   AROM / PROM / Strength  AROM;Strength      AROM   Overall AROM Comments  Bilat. hip/knee AROM grossly WFL for bed mobility and  transfers, guarding with PROM (pt. reports she is nervous rather than limitation due to pain)    AROM Assessment Site  Hip;Knee;Lumbar    Right/Left Hip  Right;Left    Lumbar Flexion  85    Lumbar Extension  20   therapist assist for balance   Lumbar - Right Side Bend  25   Therapist assist for balance   Lumbar - Left Side Bend  25   Therapist assist for balance   Lumbar - Right Rotation  40%    Lumbar - Left Rotation  50%      Strength   Strength Assessment Site  Hip;Knee    Right/Left Hip  Right;Left    Right Hip Flexion  4/5    Left Hip Flexion  4+/5    Right/Left Knee  Right;Left    Right Knee Flexion  5/5    Right Knee Extension  4+/5    Left Knee Flexion  5/5    Left Knee Extension  5/5      Palpation   Palpation comment  Tight lumbar paraspinals otherwise no tenderness to palpation      Special Tests   Other special tests  SLR (-)      Ambulation/Gait   Gait Comments  TUG x 18.5 sec, pt. ambulates with decreased cadence but able bilat. step-through gait, decreased arm swing but no antalgia or other overt gait deviations      Balance   Balance Assessed  Yes      Static Standing Balance   Static Standing - Comment/# of Minutes  SLS-unable bilat., Romberg firm surface feet together x 20 sec, x 4 sec with eyes closed, Romberg on foam feet 5 in. apart x 12 sec eyes open, unable eyes closed                Objective measurements completed on examination: See above findings.      [facility] Adult PT Treatment/Exercise - 06/24/18 0001      Exercises   Exercises  --   brief HEP handout review-see chart copy            PT Education - 06/24/18 1535    Education Details  exam findings, balance components, HEP, POC    Person(s)  Educated  Patient    Methods  Explanation;Demonstration;Verbal cues;Handout    Comprehension  Verbalized understanding       PT Short Term Goals - 06/24/18 1545      PT SHORT TERM GOAL #1   Title  Independent with HEP     Baseline  no HEP    Time  3    Period  Weeks    Status  New    Target Date  07/15/18      PT SHORT TERM GOAL #2   Title  Improve TUG to <14 sec for decreased fall risk    Baseline  18.5 sec    Time  3    Period  Weeks    Status  New    Target Date  07/15/18        PT Long Term Goals - 06/24/18 1546      PT LONG TERM GOAL #1   Title  Improve FOTO score to 33% or less impairment    Baseline  56% limited    Time  6    Period  Weeks    Status  New    Target Date  08/05/18      PT LONG TERM GOAL #2   Title  Right knee ext strength 5/5 to improve ability for stair navigation at home and transfers from low chairs    Baseline  4+/5    Time  6    Period  Weeks    Status  New    Target Date  08/05/18      PT LONG TERM GOAL #3   Title  Improve Romberg foam, EC to 10 sec or greater for improved balance to decrease fall risk    Baseline  unable    Time  6    Period  Weeks    Target Date  08/05/18      PT LONG TERM GOAL #4   Title  Tolerate standing/ambulation for periods at least 30-40 min to go grocery shopping with thigh pain 2/10 or less    Baseline  5/10    Time  6    Period  Weeks    Status  New    Target Date  08/05/18             Plan - 06/24/18 1538    Clinical Impression Statement  Pt. presents with LBP with intermittent leg pain/radiating symptoms and associated complaints of balance + walking difficulty. Based on presentation would suspect possible underlying stenosis. TUG score and balance testing suggestive of increased fall risk. Balance + gait issues likely multifactorial with LE + core weakness and decreased proprioception, decreased use of vestibular inputs for balance. Pt. 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 and physical strain   Comorbidities  anxiety, HTN, HLD,  surgical history for neck    Examination-Activity Limitations  Transfers;Squat;Bed Mobility;Lift;Stairs;Locomotion Level;Stand    Examination-Participation Restrictions  Cleaning;Community Activity    Stability/Clinical Decision Making  Evolving/Moderate complexity    Clinical Decision Making  Moderate    Rehab Potential  Good    PT Frequency  2x / week    PT Duration  6 weeks    PT Treatment/Interventions  ADLs/Self Care Home Management;Cryotherapy;Moist Heat;Balance training;Gait training;Stair training;Therapeutic exercise;Therapeutic activities;Functional mobility training;Neuromuscular re-education;Patient/family education;Manual techniques;Dry needling    PT Next Visit Plan  No estim due to heart monitor, review HEP as needed (see below), continue/progress flexion bias trunk ROM and core + LE functional strengthening, work on balance with SLS, Romberg, tandem stance and reaching outside BOS, stretch hamstrings    PT Home Exercise Plan  Pelvic tilt, SKTC, marches, hip bridge, sit<>stand, Romberg feet apart at counter on even surface    Consulted and Agree with Plan of Care  Patient       Patient will benefit from skilled therapeutic intervention in order to improve the following deficits and impairments:  Abnormal gait, Decreased balance, Decreased mobility, Pain, Impaired flexibility, Decreased strength, Decreased activity tolerance, Decreased range of motion, Difficulty walking  Visit Diagnosis: 1. Low back pain, unspecified back pain laterality, unspecified chronicity, unspecified whether sciatica present   2. Difficulty in walking, not elsewhere classified        Problem List Patient Active Problem List   Diagnosis Date Noted  . History of lacunar cerebrovascular accident (CVA) 06/18/2018  . Osteoporosis 03/13/2018  . Patellofemoral arthritis 03/05/2018  . DDD (degenerative disc disease), lumbar 03/05/2018  . Unstable gait 01/20/2018  . Aortic atherosclerosis ([place])  06/12/2017  . Palpitations 05/12/2017  . Vitamin B12 deficiency 05/12/2017  . Cervical radiculopathy 04/03/2016  . Caregiver burden 04/03/2016  . Essential tremor 04/18/2015  . Adenomatous colon polyp 06/14/2014  . Diarrhea 04/14/2014  . Heavy alcohol use 04/14/2014  . H/O cold sores 10/15/2013  . CKD (chronic kidney disease), stage III ([place]) 10/15/2013  . Hypercalcemia 01/28/2007  . TOBACCO USE 12/24/2006  . Hyperlipidemia 05/26/2006  . Anxiety state 05/26/2006  . Essential hypertension 05/26/2006  . GERD 05/26/2006    [name], PT, DPT 06/24/18 3:51 PM  [facility] [address] [place], [place], [address] Phone: [phone]   Fax:  [phone]  Name: Crystal Carroll MRN: [id number] Date of Birth: Nov 09, 1945

## 2018-06-29 ENCOUNTER — Other Ambulatory Visit: Payer: Self-pay | Admitting: Physical Therapy

## 2018-06-29 DIAGNOSIS — M810 Age-related osteoporosis without current pathological fracture: Secondary | ICD-10-CM

## 2018-07-02 ENCOUNTER — Encounter: Payer: Self-pay | Admitting: Physical Therapy

## 2018-07-02 ENCOUNTER — Ambulatory Visit: Payer: PPO | Admitting: Physical Therapy

## 2018-07-02 ENCOUNTER — Other Ambulatory Visit: Payer: Self-pay

## 2018-07-02 DIAGNOSIS — M545 Low back pain, unspecified: Secondary | ICD-10-CM

## 2018-07-02 DIAGNOSIS — R262 Difficulty in walking, not elsewhere classified: Secondary | ICD-10-CM

## 2018-07-02 NOTE — Therapy (Signed)
[place], [place], [address] Phone: [phone]   Fax:  [phone]  Physical Therapy Treatment  Patient Details  Name: Crystal Carroll MRN: [id number] Date of Birth: Nov 23, 1945 Referring Provider (PT): Hulan Saas, DO   Encounter Date: 07/02/2018  PT End of Session - 07/02/18 1433    Visit Number  2    Number of Visits  12    Date for PT Re-Evaluation  08/05/18    Authorization Type  [employer], Progress note by visit 10 and add KX at 79    PT Start Time  1408    PT Stop Time  1450    PT Time Calculation (min)  42 min    Activity Tolerance  Patient tolerated treatment well    Behavior During Therapy  [facility] for tasks assessed/performed       Past Medical History:  Diagnosis Date  . Anxiety   . GERD (gastroesophageal reflux disease)   . Hyperlipidemia   . Hypertension     Past Surgical History:  Procedure Laterality Date  . CERVICAL LAMINECTOMY  2004   Earle Gell    There were no vitals filed for this visit.  Subjective Assessment - 07/02/18 1411    Subjective  Pt arriving to therapy reporting pain down bilateral LE's. Pt reporting 6/10 pain which can increase going up the stairs. Pt reporting decreased balance.    Pertinent History  cervical fusion, gait/balance difficulties, anxiety    Limitations  Standing;Walking;Lifting;House hold activities    How long can you sit comfortably?  depends on position but no significant limitations with sitting tolerance    Patient Stated Goals  Improve strength    Currently in Pain?  Yes    Pain Score  5     Pain Location  Leg    Pain Orientation  Right;Left    Pain Descriptors / Indicators  Aching    Pain Type  Acute pain    Pain Radiating Towards  right thigh > left thigh    Pain Onset  More than a month ago    Pain Frequency  Intermittent    Aggravating Factors   stairs, pt reporting after being up for a couple of hours like  going to [employer] that her pain increases         [facility] PT Assessment - 07/02/18 0001      Assessment   Medical Diagnosis  LBP    Referring Provider (PT)  Hulan Saas, DO    Onset Date/Surgical Date  05/24/18    Hand Dominance  Right    Prior Therapy  none for current episode      Precautions   Precaution Comments  fall risk, wearing heart monitor July 11th      Restrictions   Weight Bearing Restrictions  No      Balance Screen   Has the patient fallen in the past 6 months  Yes    How many times?  2                   [facility] Adult PT Treatment/Exercise - 07/02/18 0001      Exercises   Exercises  Knee/Hip      Knee/Hip Exercises: Stretches   Active Hamstring Stretch  Right;Left;3 reps;30 seconds      Knee/Hip Exercises: Standing   Heel Raises  15 reps   toe raises x 15 reps   Hip Flexion  Stengthening;Both;15 reps  Hip ADduction  Strengthening;15 reps    Functional Squat  5 sets   in parallel bars with UE assist   Other Standing Knee Exercises  tandum walking in parallel bars with UE support and Standby assistnace      Knee/Hip Exercises: Seated   Long Arc Quad  AROM;Strengthening;Both;15 reps    [facility] [facility]  15 reps    Clamshell with [employer]   15 reps   Marching  Strengthening;15 reps    Sit to [employer]  10 reps;with UE support   progressing to no UE support     Knee/Hip Exercises: Supine   Bridges  Right;2 sets;5 reps    Bridges Limitations  verbal cues for lift off, ball between knees to keep hips from adduction    Straight Leg Raises  Strengthening;Both;2 sets;5 reps;Limitations    Other Supine Knee/Hip Exercises  SKTC 3 reps on each LE holding 20 seconds    Other Supine Knee/Hip Exercises  hip abduction x 10 reps             PT Education - 07/02/18 1428    Education Details  Importance of consistency of    Person(s) Educated  Patient    Methods  Explanation;Demonstration    Comprehension  Verbalized  understanding;Returned demonstration       PT Short Term Goals - 07/02/18 1439      PT SHORT TERM GOAL #1   Title  Independent with HEP    Baseline  no HEP    Time  3    Period  Weeks    Status  New    Target Date  07/15/18      PT SHORT TERM GOAL #2   Title  Improve TUG to <14 sec for decreased fall risk    Baseline  18.5 sec    Time  3    Period  Weeks    Status  New        PT Long Term Goals - 06/24/18 1546      PT LONG TERM GOAL #1   Title  Improve FOTO score to 33% or less impairment    Baseline  56% limited    Time  6    Period  Weeks    Status  New    Target Date  08/05/18      PT LONG TERM GOAL #2   Title  Right knee ext strength 5/5 to improve ability for stair navigation at home and transfers from low chairs    Baseline  4+/5    Time  6    Period  Weeks    Status  New    Target Date  08/05/18      PT LONG TERM GOAL #3   Title  Improve Romberg foam, EC to 10 sec or greater for improved balance to decrease fall risk    Baseline  unable    Time  6    Period  Weeks    Target Date  08/05/18      PT LONG TERM GOAL #4   Title  Tolerate standing/ambulation for periods at least 30-40 min to go grocery shopping with thigh pain 2/10 or less    Baseline  5/10    Time  6    Period  Weeks    Status  New    Target Date  08/05/18            Plan - 07/02/18 1435    Clinical Impression  Statement  Pt presenting today reporting bilateral LE's pain across her thighs. Pt also reporting balance difficulties. Pt with weakness noted in bilateral LE"s more on the R side when compared to left. We started pt on standing balance exercises today with CGA assistance required.    Personal Factors and Comorbidities  Age;Comorbidity 3+;Other    Comorbidities  anxiety, HTN, HLD, surgical history for neck    Examination-Activity Limitations  Transfers;Squat;Bed Mobility;Lift;Stairs;Locomotion Level;Stand    Examination-Participation Restrictions  Cleaning;Community Activity     Stability/Clinical Decision Making  Evolving/Moderate complexity    PT Frequency  2x / week    PT Duration  6 weeks    PT Treatment/Interventions  ADLs/Self Care Home Management;Cryotherapy;Moist Heat;Balance training;Gait training;Stair training;Therapeutic exercise;Therapeutic activities;Functional mobility training;Neuromuscular re-education;Patient/family education;Manual techniques;Dry needling    PT Next Visit Plan  No estim due to heart monitor, review HEP as needed (see below), continue/progress flexion bias trunk ROM and core + LE functional strengthening, work on balance with SLS, Romberg, tandem stance and reaching outside BOS, stretch hamstrings    PT Home Exercise Plan  Pelvic tilt, SKTC, marches, hip bridge, sit<>stand, Romberg feet apart at counter on even surface       Patient will benefit from skilled therapeutic intervention in order to improve the following deficits and impairments:  Abnormal gait, Decreased balance, Decreased mobility, Pain, Impaired flexibility, Decreased strength, Decreased activity tolerance, Decreased range of motion, Difficulty walking  Visit Diagnosis: 1. Low back pain, unspecified back pain laterality, unspecified chronicity, unspecified whether sciatica present   2. Difficulty in walking, not elsewhere classified        Problem List Patient Active Problem List   Diagnosis Date Noted  . History of lacunar cerebrovascular accident (CVA) 06/18/2018  . Osteoporosis 03/13/2018  . Patellofemoral arthritis 03/05/2018  . DDD (degenerative disc disease), lumbar 03/05/2018  . Unstable gait 01/20/2018  . Aortic atherosclerosis ([place]) 06/12/2017  . Palpitations 05/12/2017  . Vitamin B12 deficiency 05/12/2017  . Cervical radiculopathy 04/03/2016  . Caregiver burden 04/03/2016  . Essential tremor 04/18/2015  . Adenomatous colon polyp 06/14/2014  . Diarrhea 04/14/2014  . Heavy alcohol use 04/14/2014  . H/O cold sores 10/15/2013  . CKD (chronic  kidney disease), stage III ([place]) 10/15/2013  . Hypercalcemia 01/28/2007  . TOBACCO USE 12/24/2006  . Hyperlipidemia 05/26/2006  . Anxiety state 05/26/2006  . Essential hypertension 05/26/2006  . GERD 05/26/2006    Oretha Caprice, PT 07/02/2018, 3:52 PM  [facility] [address] [place], [place], [address] Phone: [phone]   Fax:  [phone]  Name: Crystal Carroll MRN: [id number] Date of Birth: [birth year]

## 2018-07-03 ENCOUNTER — Encounter

## 2018-07-07 ENCOUNTER — Other Ambulatory Visit: Payer: Self-pay

## 2018-07-07 ENCOUNTER — Encounter: Payer: Self-pay | Admitting: Physical Therapy

## 2018-07-07 ENCOUNTER — Ambulatory Visit: Payer: PPO | Admitting: Physical Therapy

## 2018-07-07 DIAGNOSIS — M545 Low back pain, unspecified: Secondary | ICD-10-CM

## 2018-07-07 DIAGNOSIS — R262 Difficulty in walking, not elsewhere classified: Secondary | ICD-10-CM

## 2018-07-07 NOTE — Therapy (Signed)
[place], [place], [address] Phone: [phone]   Fax:  [phone]  Physical Therapy Treatment  Patient Details  Name: Crystal Carroll MRN: [id number] Date of Birth: [birth year] Referring Provider (PT): Hulan Saas, DO   Encounter Date: 07/07/2018  PT End of Session - 07/07/18 1546    Visit Number  3    Number of Visits  12    Date for PT Re-Evaluation  08/05/18    Authorization Type  [employer], Progress note by visit 10 and add KX at 15    PT Start Time  0338   10 minutes   PT Stop Time  0425    PT Time Calculation (min)  47 min       Past Medical History:  Diagnosis Date  . Anxiety   . GERD (gastroesophageal reflux disease)   . Hyperlipidemia   . Hypertension     Past Surgical History:  Procedure Laterality Date  . CERVICAL LAMINECTOMY  2004   Earle Gell    There were no vitals filed for this visit.  Subjective Assessment - 07/07/18 1544    Subjective  My right leg is hurting me. I have tried the HEP.    Currently in Pain?  Yes    Pain Score  9     Pain Location  --   thigh   Pain Orientation  Right    Pain Descriptors / Indicators  Sore;Sharp                       [facility] Adult PT Treatment/Exercise - 07/07/18 0001      Knee/Hip Exercises: Stretches   Other Knee/Hip Stretches  buterfly stretch , lower trunk rotation, knee to chest, hip flexor stretch       Knee/Hip Exercises: Seated   Ball Squeeze  15 reps    Clamshell with [employer]  Red   15 reps     Knee/Hip Exercises: Supine   Hip Adduction Isometric  --    Bridges  Right;2 sets;5 reps    Bridges Limitations  verbal cues for lift off, ball between knees to keep hips from adduction    Straight Leg Raises  --    Other Supine Knee/Hip Exercises  SKTC 3 reps on each LE holding 20 seconds    Other Supine Knee/Hip Exercises  --      Manual Therapy   Manual therapy comments  manual  soft tissue and massage roller to right VMO, bicep femoris, adductor                PT Short Term Goals - 07/02/18 1439      PT SHORT TERM GOAL #1   Title  Independent with HEP    Baseline  no HEP    Time  3    Period  Weeks    Status  New    Target Date  07/15/18      PT SHORT TERM GOAL #2   Title  Improve TUG to <14 sec for decreased fall risk    Baseline  18.5 sec    Time  3    Period  Weeks    Status  New        PT Long Term Goals - 06/24/18 1546      PT LONG TERM GOAL #1   Title  Improve FOTO score to 33% or less impairment    Baseline  56% limited  Time  6    Period  Weeks    Status  New    Target Date  08/05/18      PT LONG TERM GOAL #2   Title  Right knee ext strength 5/5 to improve ability for stair navigation at home and transfers from low chairs    Baseline  4+/5    Time  6    Period  Weeks    Status  New    Target Date  08/05/18      PT LONG TERM GOAL #3   Title  Improve Romberg foam, EC to 10 sec or greater for improved balance to decrease fall risk    Baseline  unable    Time  6    Period  Weeks    Target Date  08/05/18      PT LONG TERM GOAL #4   Title  Tolerate standing/ambulation for periods at least 30-40 min to go grocery shopping with thigh pain 2/10 or less    Baseline  5/10    Time  6    Period  Weeks    Status  New    Target Date  08/05/18            Plan - 07/07/18 1558    Clinical Impression Statement  Pt reports 9/10 right thigh pain today. One episode of stumbling with SPC upon entrance to gym without LOB. Focused mat strength and hip stretching, Manual to right thigh. She reported feeling much better post session.    PT Next Visit Plan  No estim due to heart monitor, review HEP as needed (see below), continue/progress flexion bias trunk ROM and core + LE functional strengthening, work on balance with SLS, Romberg, tandem stance and reaching outside BOS, stretch hamstrings    PT Home Exercise Plan  Pelvic tilt,  SKTC, marches, hip bridge, sit<>stand, Romberg feet apart at counter on even surface       Patient will benefit from skilled therapeutic intervention in order to improve the following deficits and impairments:  Abnormal gait, Decreased balance, Decreased mobility, Pain, Impaired flexibility, Decreased strength, Decreased activity tolerance, Decreased range of motion, Difficulty walking  Visit Diagnosis: 1. Low back pain, unspecified back pain laterality, unspecified chronicity, unspecified whether sciatica present   2. Difficulty in walking, not elsewhere classified        Problem List Patient Active Problem List   Diagnosis Date Noted  . History of lacunar cerebrovascular accident (CVA) 06/18/2018  . Osteoporosis 03/13/2018  . Patellofemoral arthritis 03/05/2018  . DDD (degenerative disc disease), lumbar 03/05/2018  . Unstable gait 01/20/2018  . Aortic atherosclerosis ([place]) 06/12/2017  . Palpitations 05/12/2017  . Vitamin B12 deficiency 05/12/2017  . Cervical radiculopathy 04/03/2016  . Caregiver burden 04/03/2016  . Essential tremor 04/18/2015  . Adenomatous colon polyp 06/14/2014  . Diarrhea 04/14/2014  . Heavy alcohol use 04/14/2014  . H/O cold sores 10/15/2013  . CKD (chronic kidney disease), stage III ([place]) 10/15/2013  . Hypercalcemia 01/28/2007  . TOBACCO USE 12/24/2006  . Hyperlipidemia 05/26/2006  . Anxiety state 05/26/2006  . Essential hypertension 05/26/2006  . GERD 05/26/2006    Dorene Ar, PTA 07/07/2018, 4:29 PM  [facility] [address] [place], [place], [address] Phone: [phone]   Fax:  [phone]  Name: Crystal Carroll MRN: [id number] Date of Birth: Sep 19, 1945

## 2018-07-09 ENCOUNTER — Other Ambulatory Visit: Payer: Self-pay

## 2018-07-09 ENCOUNTER — Encounter: Payer: Self-pay | Admitting: Physical Therapy

## 2018-07-09 ENCOUNTER — Other Ambulatory Visit: Payer: Self-pay | Admitting: Family Medicine

## 2018-07-09 ENCOUNTER — Ambulatory Visit: Payer: PPO | Attending: Family Medicine | Admitting: Physical Therapy

## 2018-07-09 DIAGNOSIS — M545 Low back pain, unspecified: Secondary | ICD-10-CM

## 2018-07-09 DIAGNOSIS — R262 Difficulty in walking, not elsewhere classified: Secondary | ICD-10-CM | POA: Diagnosis not present

## 2018-07-09 NOTE — Therapy (Signed)
[place], [place], [address] Phone: [phone]   Fax:  [phone]  Physical Therapy Treatment  Patient Details  Name: Crystal Carroll MRN: [id number] Date of Birth: 08/11/1945 Referring Provider (PT): Hulan Saas, DO   Encounter Date: 07/09/2018  PT End of Session - 07/09/18 1348    Visit Number  4    Number of Visits  12    Date for PT Re-Evaluation  08/05/18    Authorization Type  [employer], Progress note by visit 10 and add KX at 18    PT Start Time  1302    PT Stop Time  1345    PT Time Calculation (min)  43 min    Activity Tolerance  Patient tolerated treatment well    Behavior During Therapy  [facility] for tasks assessed/performed       Past Medical History:  Diagnosis Date  . Anxiety   . GERD (gastroesophageal reflux disease)   . Hyperlipidemia   . Hypertension     Past Surgical History:  Procedure Laterality Date  . CERVICAL LAMINECTOMY  2004   Earle Gell    There were no vitals filed for this visit.  Subjective Assessment - 07/09/18 1304    Subjective  "My (right) leg is hurting again but not as much" and leg "did not hurt at all" after last session. Pt. noting some left knee soreness this PM.    Pertinent History  cervical fusion, gait/balance difficulties, anxiety    Currently in Pain?  Yes    Pain Score  3     Pain Location  Knee    Pain Orientation  Left    Pain Descriptors / Indicators  Sharp    Pain Type  Chronic pain    Pain Onset  In the past 7 days   recent exacerbation of chronic pain in knees   Aggravating Factors   worse early in AM and eases with activity.    Pain Relieving Factors  movement, activity                       [facility] Adult PT Treatment/Exercise - 07/09/18 0001      Exercises   Exercises  Lumbar;Knee/Hip      Lumbar Exercises: Stretches   Passive Hamstring Stretch  Right;Left;2 reps;30 seconds    Double Knee to  Chest Stretch Limitations  x 15 reps AAROM with 65 cm P-ball    Lower Trunk Rotation Limitations  x 15 ea. way with legs on 65 cm P-ball      Lumbar Exercises: Supine   Bent Knee Raise  10 reps    Bridge with Ball Squeeze  15 reps    Other Supine Lumbar Exercises  clamshell red x 15 reps      Knee/Hip Exercises: Aerobic   Nustep  L3 x 5 min UE/LE      Knee/Hip Exercises: Standing   Heel Raises  Both;20 reps    Heel Raises Limitations  Airex use for balance    Other Standing Knee Exercises  Alt. marches/brief SLS 2x10 on Airex for balance, Romberg EC 3x10 sec for balance      Knee/Hip Exercises: Seated   Sit to Sand  2 sets;5 reps   front high low table without UE assistance            PT Education - 07/09/18 1348    Education Details  balance components, exercise form  Person(s) Educated  Patient    Methods  Explanation    Comprehension  Verbalized understanding       PT Short Term Goals - 07/02/18 1439      PT SHORT TERM GOAL #1   Title  Independent with HEP    Baseline  no HEP    Time  3    Period  Weeks    Status  New    Target Date  07/15/18      PT SHORT TERM GOAL #2   Title  Improve TUG to <14 sec for decreased fall risk    Baseline  18.5 sec    Time  3    Period  Weeks    Status  New        PT Long Term Goals - 06/24/18 1546      PT LONG TERM GOAL #1   Title  Improve FOTO score to 33% or less impairment    Baseline  56% limited    Time  6    Period  Weeks    Status  New    Target Date  08/05/18      PT LONG TERM GOAL #2   Title  Right knee ext strength 5/5 to improve ability for stair navigation at home and transfers from low chairs    Baseline  4+/5    Time  6    Period  Weeks    Status  New    Target Date  08/05/18      PT LONG TERM GOAL #3   Title  Improve Romberg foam, EC to 10 sec or greater for improved balance to decrease fall risk    Baseline  unable    Time  6    Period  Weeks    Target Date  08/05/18      PT LONG TERM  GOAL #4   Title  Tolerate standing/ambulation for periods at least 30-40 min to go grocery shopping with thigh pain 2/10 or less    Baseline  5/10    Time  6    Period  Weeks    Status  New    Target Date  08/05/18            Plan - 07/09/18 1348    Clinical Impression Statement  Challenged with balance/proprioceptive exercises and some initial anxiety but these were well-tolerated without significant pain, requiring intermittent CGA for safety. Lumbar/LE responding well to flexion bias exercises, ROM. For knee pain and crepitus worse in AM and eased with activity would suspect consistent with underlying OA.    Personal Factors and Comorbidities  Age;Comorbidity 3+;Other    Comorbidities  anxiety, HTN, HLD, surgical history for neck    Examination-Activity Limitations  Transfers;Squat;Bed Mobility;Lift;Stairs;Locomotion Level;Stand    Clinical Decision Making  Moderate    Rehab Potential  Good    PT Frequency  2x / week    PT Duration  6 weeks    PT Treatment/Interventions  ADLs/Self Care Home Management;Cryotherapy;Moist Heat;Balance training;Gait training;Stair training;Therapeutic exercise;Therapeutic activities;Functional mobility training;Neuromuscular re-education;Patient/family education;Manual techniques;Dry needling    PT Next Visit Plan  No estim due to heart monitor, recheck TUG, continue/progress flexion bias trunk ROM and core + LE functional strengthening, work on balance with SLS, Romberg, tandem stance and reaching outside BOS, stretch hamstrings    PT Home Exercise Plan  Pelvic tilt, SKTC, marches, hip bridge, sit<>stand, Romberg feet apart at counter on even surface    Consulted and Agree  with Plan of Care  Patient       Patient will benefit from skilled therapeutic intervention in order to improve the following deficits and impairments:  Abnormal gait, Decreased balance, Decreased mobility, Pain, Impaired flexibility, Decreased strength, Decreased activity tolerance,  Decreased range of motion, Difficulty walking  Visit Diagnosis: 1. Low back pain, unspecified back pain laterality, unspecified chronicity, unspecified whether sciatica present   2. Difficulty in walking, not elsewhere classified        Problem List Patient Active Problem List   Diagnosis Date Noted  . History of lacunar cerebrovascular accident (CVA) 06/18/2018  . Osteoporosis 03/13/2018  . Patellofemoral arthritis 03/05/2018  . DDD (degenerative disc disease), lumbar 03/05/2018  . Unstable gait 01/20/2018  . Aortic atherosclerosis ([place]) 06/12/2017  . Palpitations 05/12/2017  . Vitamin B12 deficiency 05/12/2017  . Cervical radiculopathy 04/03/2016  . Caregiver burden 04/03/2016  . Essential tremor 04/18/2015  . Adenomatous colon polyp 06/14/2014  . Diarrhea 04/14/2014  . Heavy alcohol use 04/14/2014  . H/O cold sores 10/15/2013  . CKD (chronic kidney disease), stage III ([place]) 10/15/2013  . Hypercalcemia 01/28/2007  . TOBACCO USE 12/24/2006  . Hyperlipidemia 05/26/2006  . Anxiety state 05/26/2006  . Essential hypertension 05/26/2006  . GERD 05/26/2006    [name], PT, DPT 07/09/18 1:52 PM  [facility] [address] [place], [place], [address] Phone: [phone]   Fax:  [phone]  Name: Crystal Carroll MRN: [id number] Date of Birth: 1945/09/20

## 2018-07-13 ENCOUNTER — Other Ambulatory Visit: Payer: Self-pay

## 2018-07-13 ENCOUNTER — Encounter: Payer: Self-pay | Admitting: Physical Therapy

## 2018-07-13 ENCOUNTER — Ambulatory Visit: Payer: PPO | Admitting: Physical Therapy

## 2018-07-13 DIAGNOSIS — M545 Low back pain, unspecified: Secondary | ICD-10-CM

## 2018-07-13 DIAGNOSIS — R262 Difficulty in walking, not elsewhere classified: Secondary | ICD-10-CM

## 2018-07-13 NOTE — Therapy (Signed)
[place], [place], [address] Phone: [phone]   Fax:  [phone]  Physical Therapy Treatment  Patient Details  Name: Crystal Carroll MRN: [id number] Date of Birth: [birth year] Referring Provider (PT): Hulan Saas, DO   Encounter Date: 07/13/2018  PT End of Session - 07/13/18 1516    Visit Number  5    Number of Visits  12    Date for PT Re-Evaluation  08/05/18    Authorization Type  [employer], Progress note by visit 10 and add KX at 46    PT Start Time  1428    PT Stop Time  1515    PT Time Calculation (min)  47 min    Activity Tolerance  Patient tolerated treatment well    Behavior During Therapy  [facility] for tasks assessed/performed       Past Medical History:  Diagnosis Date  . Anxiety   . GERD (gastroesophageal reflux disease)   . Hyperlipidemia   . Hypertension     Past Surgical History:  Procedure Laterality Date  . CERVICAL LAMINECTOMY  2004   Earle Gell    There were no vitals filed for this visit.  Subjective Assessment - 07/13/18 1429    Subjective  Pt. reports sore after last tx. session (the next 1-2 days)-primary soreness/discomfort is in right>left quadricep region.    Patient Stated Goals  Improve strength    Currently in Pain?  Yes    Pain Score  7     Pain Location  Leg    Pain Orientation  Right    Pain Type  Chronic pain    Pain Radiating Towards  right anterior thigh/quadricep region    Pain Onset  In the past 7 days    Pain Frequency  Intermittent    Aggravating Factors   sit>stand, activity    Pain Relieving Factors  rest    Effect of Pain on Daily Activities  difficulty with sit>stand, getting up from chair         [facility] PT Assessment - 07/13/18 0001      Ambulation/Gait   Gait Comments  TUG x 16 sec                   [facility] Adult PT Treatment/Exercise - 07/13/18 0001      Lumbar Exercises: Aerobic   Nustep  L3 x 5 min  UE/LE      Lumbar Exercises: Supine   Bent Knee Raise  10 reps      Knee/Hip Exercises: Stretches   Passive Hamstring Stretch  Right;Left;3 reps;30 seconds    Quad Stretch Limitations  R quad + hip flexor stretch in supine with RLE off edge of mat      Knee/Hip Exercises: Standing   Heel Raises  Both;20 reps    Heel Raises Limitations  Airex for balance    Other Standing Knee Exercises  Alt. marches/brief SLS 2x10 on Airex for balance, Romberg EC 3x10 sec for balance      Knee/Hip Exercises: Supine   Bridges  AROM;10 reps    Bridges Limitations  legs on support roll      Manual Therapy   Manual Therapy  Soft tissue mobilization    Soft tissue mobilization  STM/IASTM right quadricep incl. foam roll use             PT Education - 07/13/18 1514    Education Details  balance, delayed onset muscle  soreness    Person(s) Educated  Patient    Methods  Explanation    Comprehension  Verbalized understanding       PT Short Term Goals - 07/13/18 1520      PT SHORT TERM GOAL #1   Title  Independent with HEP    Baseline  met for initial HEP    Time  3    Period  Weeks    Status  Achieved      PT SHORT TERM GOAL #2   Title  Improve TUG to <14 sec for decreased fall risk    Baseline  16 sec    Time  3    Period  Weeks    Status  On-going    Target Date  07/15/18        PT Long Term Goals - 06/24/18 1546      PT LONG TERM GOAL #1   Title  Improve FOTO score to 33% or less impairment    Baseline  56% limited    Time  6    Period  Weeks    Status  New    Target Date  08/05/18      PT LONG TERM GOAL #2   Title  Right knee ext strength 5/5 to improve ability for stair navigation at home and transfers from low chairs    Baseline  4+/5    Time  6    Period  Weeks    Status  New    Target Date  08/05/18      PT LONG TERM GOAL #3   Title  Improve Romberg foam, EC to 10 sec or greater for improved balance to decrease fall risk    Baseline  unable    Time  6     Period  Weeks    Target Date  08/05/18      PT LONG TERM GOAL #4   Title  Tolerate standing/ambulation for periods at least 30-40 min to go grocery shopping with thigh pain 2/10 or less    Baseline  5/10    Time  6    Period  Weeks    Status  New    Target Date  08/05/18            Plan - 07/13/18 1517    Clinical Impression Statement  Pt. report of thigh symptoms as noted in subjective (soreness 1-2 days after last session) consistent iwth delayed onset muscle soreness vs. contributing factors with previous thigh symptoms associated with lumbar stenosis and knee OA. Suspect myofascial component to thigh symptoms so included STM to address. Mild improvement with TUG from baseline but time (16 sec) still consistent with increased fall risk.    Comorbidities  anxiety, HTN, HLD, surgical history for neck    Stability/Clinical Decision Making  Evolving/Moderate complexity    Clinical Decision Making  Moderate    Rehab Potential  Good    PT Frequency  2x / week    PT Treatment/Interventions  ADLs/Self Care Home Management;Cryotherapy;Moist Heat;Balance training;Gait training;Stair training;Therapeutic exercise;Therapeutic activities;Functional mobility training;Neuromuscular re-education;Patient/family education;Manual techniques;Dry needling    PT Next Visit Plan  No estim due to heart monitor, continue/progress flexion bias trunk ROM and core + LE functional strengthening, work on balance with SLS, Romberg, tandem stance and reaching outside BOS, stretch hamstrings    PT Home Exercise Plan  Pelvic tilt, SKTC, marches, hip bridge, sit<>stand, Romberg feet apart at counter on even surface    Consulted  and Agree with Plan of Care  Patient       Patient will benefit from skilled therapeutic intervention in order to improve the following deficits and impairments:  Abnormal gait, Decreased balance, Decreased mobility, Pain, Impaired flexibility, Decreased strength, Decreased activity  tolerance, Decreased range of motion, Difficulty walking  Visit Diagnosis: 1. Low back pain, unspecified back pain laterality, unspecified chronicity, unspecified whether sciatica present   2. Difficulty in walking, not elsewhere classified        Problem List Patient Active Problem List   Diagnosis Date Noted  . History of lacunar cerebrovascular accident (CVA) 06/18/2018  . Osteoporosis 03/13/2018  . Patellofemoral arthritis 03/05/2018  . DDD (degenerative disc disease), lumbar 03/05/2018  . Unstable gait 01/20/2018  . Aortic atherosclerosis ([place]) 06/12/2017  . Palpitations 05/12/2017  . Vitamin B12 deficiency 05/12/2017  . Cervical radiculopathy 04/03/2016  . Caregiver burden 04/03/2016  . Essential tremor 04/18/2015  . Adenomatous colon polyp 06/14/2014  . Diarrhea 04/14/2014  . Heavy alcohol use 04/14/2014  . H/O cold sores 10/15/2013  . CKD (chronic kidney disease), stage III ([place]) 10/15/2013  . Hypercalcemia 01/28/2007  . TOBACCO USE 12/24/2006  . Hyperlipidemia 05/26/2006  . Anxiety state 05/26/2006  . Essential hypertension 05/26/2006  . GERD 05/26/2006   [name], PT, DPT 07/13/18 3:21 PM  [facility] [address] [place], [place], [address] Phone: [phone]   Fax:  [phone]  Name: Crystal Carroll MRN: [id number] Date of Birth: Apr 14, 1945

## 2018-07-16 ENCOUNTER — Other Ambulatory Visit: Payer: Self-pay

## 2018-07-16 ENCOUNTER — Ambulatory Visit: Payer: PPO | Admitting: Physical Therapy

## 2018-07-16 ENCOUNTER — Encounter: Payer: Self-pay | Admitting: Physical Therapy

## 2018-07-16 DIAGNOSIS — R262 Difficulty in walking, not elsewhere classified: Secondary | ICD-10-CM

## 2018-07-16 DIAGNOSIS — M545 Low back pain, unspecified: Secondary | ICD-10-CM

## 2018-07-16 NOTE — Therapy (Signed)
[place], [place], [address] Phone: [phone]   Fax:  [phone]  Physical Therapy Treatment  Patient Details  Name: Crystal Carroll MRN: [id number] Date of Birth: 1945-10-23 Referring Provider (PT): Hulan Saas, DO   Encounter Date: 07/16/2018  PT End of Session - 07/16/18 1502    Visit Number  6    Number of Visits  12    Date for PT Re-Evaluation  08/05/18    Authorization Type  [employer], Progress note by visit 10 and add KX at 47    PT Start Time  1433    PT Stop Time  1515    PT Time Calculation (min)  42 min    Activity Tolerance  Patient tolerated treatment well    Behavior During Therapy  [facility] for tasks assessed/performed       Past Medical History:  Diagnosis Date  . Anxiety   . GERD (gastroesophageal reflux disease)   . Hyperlipidemia   . Hypertension     Past Surgical History:  Procedure Laterality Date  . CERVICAL LAMINECTOMY  2004   Earle Gell    There were no vitals filed for this visit.  Subjective Assessment - 07/16/18 1456    Subjective  Still with some quad soreness from exercises as well as recent pain down thigh into IT band region.                       [facility] Adult PT Treatment/Exercise - 07/16/18 0001      Lumbar Exercises: Stretches   Passive Hamstring Stretch  Right;Left;2 reps;30 seconds    Quad Stretch  Right;3 reps;30 seconds    ITB Stretch  Right;3 reps;30 seconds    Other Lumbar Stretch Exercise  seated trunk flexion with 65 mm ball roll out x 12      Lumbar Exercises: Aerobic   Nustep  L3 x 5 min UE/LE      Lumbar Exercises: Supine   Bent Knee Raise  10 reps    Bridge with [employer]  15 reps      Knee/Hip Exercises: Standing   Heel Raises  Both;20 reps    Heel Raises Limitations  Airex for balance    Rocker Board  2 minutes    Rocker Board Limitations  1 min ea. lateral and fw/rev dynamic balance with  blue board    Other Standing Knee Exercises  Alt. marches/brief SLS 2x10 on Airex for balance, Romberg EC 3x10 sec for balance    Other Standing Knee Exercises  Romberg foam EC 20 sec x 3      Manual Therapy   Manual Therapy  Soft tissue mobilization    Soft tissue mobilization  STM/IASTM right quadricep and IT band incl. foam roll use             PT Education - 07/16/18 1501    Education Details  IT band anatomy    Person(s) Educated  Patient    Methods  Explanation    Comprehension  Verbalized understanding       PT Short Term Goals - 07/13/18 1520      PT SHORT TERM GOAL #1   Title  Independent with HEP    Baseline  met for initial HEP    Time  3    Period  Weeks    Status  Achieved      PT SHORT TERM GOAL #2  Title  Improve TUG to <14 sec for decreased fall risk    Baseline  16 sec    Time  3    Period  Weeks    Status  On-going    Target Date  07/15/18        PT Long Term Goals - 06/24/18 1546      PT LONG TERM GOAL #1   Title  Improve FOTO score to 33% or less impairment    Baseline  56% limited    Time  6    Period  Weeks    Status  New    Target Date  08/05/18      PT LONG TERM GOAL #2   Title  Right knee ext strength 5/5 to improve ability for stair navigation at home and transfers from low chairs    Baseline  4+/5    Time  6    Period  Weeks    Status  New    Target Date  08/05/18      PT LONG TERM GOAL #3   Title  Improve Romberg foam, EC to 10 sec or greater for improved balance to decrease fall risk    Baseline  unable    Time  6    Period  Weeks    Target Date  08/05/18      PT LONG TERM GOAL #4   Title  Tolerate standing/ambulation for periods at least 30-40 min to go grocery shopping with thigh pain 2/10 or less    Baseline  5/10    Time  6    Period  Weeks    Status  New    Target Date  08/05/18            Plan - 07/16/18 1506    Clinical Impression Statement  Continue to suspect some local muscular contribution to  thigh pain involving quadricep and IT band so continued manual therapy to help address along with lumbar flexion bias exercises and stretches for possible radicular component from lumbar stenosis. Decreased use of vestibular inputs for balance as well as decreased proprioception still evident with balance challenges.    Stability/Clinical Decision Making  Evolving/Moderate complexity    Clinical Decision Making  Moderate    Rehab Potential  Good    PT Frequency  2x / week    PT Duration  6 weeks    PT Treatment/Interventions  ADLs/Self Care Home Management;Cryotherapy;Moist Heat;Balance training;Gait training;Stair training;Therapeutic exercise;Therapeutic activities;Functional mobility training;Neuromuscular re-education;Patient/family education;Manual techniques;Dry needling    PT Next Visit Plan  No estim due to heart monitor, continue/progress flexion bias trunk ROM and core + LE functional strengthening, work on balance with SLS, Romberg, tandem stance and reaching outside BOS, stretch hamstrings    PT Home Exercise Plan  Pelvic tilt, SKTC, marches, hip bridge, sit<>stand, Romberg feet apart at counter on even surface    Consulted and Agree with Plan of Care  Patient       Patient will benefit from skilled therapeutic intervention in order to improve the following deficits and impairments:  Abnormal gait, Decreased balance, Decreased mobility, Pain, Impaired flexibility, Decreased strength, Decreased activity tolerance, Decreased range of motion, Difficulty walking  Visit Diagnosis: 1. Low back pain, unspecified back pain laterality, unspecified chronicity, unspecified whether sciatica present   2. Difficulty in walking, not elsewhere classified        Problem List Patient Active Problem List   Diagnosis Date Noted  . History of lacunar cerebrovascular accident (  CVA) 06/18/2018  . Osteoporosis 03/13/2018  . Patellofemoral arthritis 03/05/2018  . DDD (degenerative disc disease),  lumbar 03/05/2018  . Unstable gait 01/20/2018  . Aortic atherosclerosis ([place]) 06/12/2017  . Palpitations 05/12/2017  . Vitamin B12 deficiency 05/12/2017  . Cervical radiculopathy 04/03/2016  . Caregiver burden 04/03/2016  . Essential tremor 04/18/2015  . Adenomatous colon polyp 06/14/2014  . Diarrhea 04/14/2014  . Heavy alcohol use 04/14/2014  . H/O cold sores 10/15/2013  . CKD (chronic kidney disease), stage III ([place]) 10/15/2013  . Hypercalcemia 01/28/2007  . TOBACCO USE 12/24/2006  . Hyperlipidemia 05/26/2006  . Anxiety state 05/26/2006  . Essential hypertension 05/26/2006  . GERD 05/26/2006    [name], PT, DPT 07/16/18 3:19 PM  [place] [facility] [address] [place], [place], [address] Phone: [phone]   Fax:  [phone]  Name: Crystal Carroll MRN: [id number] Date of Birth: 29-May-1945

## 2018-07-16 NOTE — Therapy (Signed)
[place], [place], [address] Phone: [phone]   Fax:  [phone]  Physical Therapy Treatment  Patient Details  Name: Crystal Carroll MRN: [id number] Date of Birth: 07-21-1945 Referring Provider (PT): Hulan Saas, DO   Encounter Date: 07/16/2018  PT End of Session - 07/16/18 1502    Visit Number  6    Number of Visits  12    Date for PT Re-Evaluation  08/05/18    Authorization Type  [employer], Progress note by visit 10 and add KX at 69    PT Start Time  1433    Activity Tolerance  Patient tolerated treatment well    Behavior During Therapy  [facility] for tasks assessed/performed       Past Medical History:  Diagnosis Date  . Anxiety   . GERD (gastroesophageal reflux disease)   . Hyperlipidemia   . Hypertension     Past Surgical History:  Procedure Laterality Date  . CERVICAL LAMINECTOMY  2004   Earle Gell    There were no vitals filed for this visit.  Subjective Assessment - 07/16/18 1456    Subjective  Still with some quad soreness from exercises as well as recent pain down thigh into IT band region.                       [place] Adult PT Treatment/Exercise - 07/16/18 0001      Lumbar Exercises: Stretches   Passive Hamstring Stretch  Right;Left;2 reps;30 seconds    Quad Stretch  Right;3 reps;30 seconds    ITB Stretch  Right;3 reps;30 seconds    Other Lumbar Stretch Exercise  seated trunk flexion with 65 mm ball roll out x 12      Lumbar Exercises: Aerobic   Nustep  L3 x 5 min UE/LE      Lumbar Exercises: Supine   Bent Knee Raise  10 reps    Bridge with [employer]  15 reps      Knee/Hip Exercises: Standing   Heel Raises  Both;20 reps    Heel Raises Limitations  Airex for balance    Rocker Board  2 minutes    Rocker Board Limitations  1 min ea. lateral and fw/rev dynamic balance with blue board    Other Standing Knee Exercises  Alt.  marches/brief SLS 2x10 on Airex for balance, Romberg EC 3x10 sec for balance    Other Standing Knee Exercises  Romberg foam EC 20 sec x 3      Manual Therapy   Manual Therapy  Soft tissue mobilization    Soft tissue mobilization  STM/IASTM right quadricep and IT band incl. foam roll use             PT Education - 07/16/18 1501    Education Details  IT band anatomy    Person(s) Educated  Patient    Methods  Explanation    Comprehension  Verbalized understanding       PT Short Term Goals - 07/13/18 1520      PT SHORT TERM GOAL #1   Title  Independent with HEP    Baseline  met for initial HEP    Time  3    Period  Weeks    Status  Achieved      PT SHORT TERM GOAL #2   Title  Improve TUG to <14 sec for decreased fall risk    Baseline  16 sec  Time  3    Period  Weeks    Status  On-going    Target Date  07/15/18        PT Long Term Goals - 06/24/18 1546      PT LONG TERM GOAL #1   Title  Improve FOTO score to 33% or less impairment    Baseline  56% limited    Time  6    Period  Weeks    Status  New    Target Date  08/05/18      PT LONG TERM GOAL #2   Title  Right knee ext strength 5/5 to improve ability for stair navigation at home and transfers from low chairs    Baseline  4+/5    Time  6    Period  Weeks    Status  New    Target Date  08/05/18      PT LONG TERM GOAL #3   Title  Improve Romberg foam, EC to 10 sec or greater for improved balance to decrease fall risk    Baseline  unable    Time  6    Period  Weeks    Target Date  08/05/18      PT LONG TERM GOAL #4   Title  Tolerate standing/ambulation for periods at least 30-40 min to go grocery shopping with thigh pain 2/10 or less    Baseline  5/10    Time  6    Period  Weeks    Status  New    Target Date  08/05/18            Plan - 07/16/18 1506    Clinical Impression Statement  Continue to suspect some local muscular contribution to thigh pain involving quadricep and IT band so  continued manual therapy to help address along with lumbar flexion bias exercises and stretches for possible radicular component from lumbar stenosis. Decreased use of vestibular inputs for balance as well as decreased proprioception still evident with balance challenges.    Stability/Clinical Decision Making  Evolving/Moderate complexity    Clinical Decision Making  Moderate    Rehab Potential  Good    PT Frequency  2x / week    PT Duration  6 weeks    PT Treatment/Interventions  ADLs/Self Care Home Management;Cryotherapy;Moist Heat;Balance training;Gait training;Stair training;Therapeutic exercise;Therapeutic activities;Functional mobility training;Neuromuscular re-education;Patient/family education;Manual techniques;Dry needling    PT Next Visit Plan  No estim due to heart monitor, continue/progress flexion bias trunk ROM and core + LE functional strengthening, work on balance with SLS, Romberg, tandem stance and reaching outside BOS, stretch hamstrings    PT Home Exercise Plan  Pelvic tilt, SKTC, marches, hip bridge, sit<>stand, Romberg feet apart at counter on even surface    Consulted and Agree with Plan of Care  Patient       Patient will benefit from skilled therapeutic intervention in order to improve the following deficits and impairments:  Abnormal gait, Decreased balance, Decreased mobility, Pain, Impaired flexibility, Decreased strength, Decreased activity tolerance, Decreased range of motion, Difficulty walking  Visit Diagnosis: 1. Low back pain, unspecified back pain laterality, unspecified chronicity, unspecified whether sciatica present   2. Difficulty in walking, not elsewhere classified        Problem List Patient Active Problem List   Diagnosis Date Noted  . History of lacunar cerebrovascular accident (CVA) 06/18/2018  . Osteoporosis 03/13/2018  . Patellofemoral arthritis 03/05/2018  . DDD (degenerative disc disease), lumbar 03/05/2018  .  Unstable gait 01/20/2018  .  Aortic atherosclerosis ([place]) 06/12/2017  . Palpitations 05/12/2017  . Vitamin B12 deficiency 05/12/2017  . Cervical radiculopathy 04/03/2016  . Caregiver burden 04/03/2016  . Essential tremor 04/18/2015  . Adenomatous colon polyp 06/14/2014  . Diarrhea 04/14/2014  . Heavy alcohol use 04/14/2014  . H/O cold sores 10/15/2013  . CKD (chronic kidney disease), stage III ([place]) 10/15/2013  . Hypercalcemia 01/28/2007  . TOBACCO USE 12/24/2006  . Hyperlipidemia 05/26/2006  . Anxiety state 05/26/2006  . Essential hypertension 05/26/2006  . GERD 05/26/2006   [name], PT, DPT 07/16/18 3:16 PM  [facility] [address] [place], [place], [address] Phone: [phone]   Fax:  [phone]  Name: MALAINA MORTELLARO MRN: [id number] Date of Birth: Oct 30, 1945

## 2018-07-17 ENCOUNTER — Other Ambulatory Visit: Payer: Self-pay | Admitting: *Deleted

## 2018-07-17 ENCOUNTER — Ambulatory Visit: Payer: PPO | Admitting: Internal Medicine

## 2018-07-17 DIAGNOSIS — M810 Age-related osteoporosis without current pathological fracture: Secondary | ICD-10-CM

## 2018-07-17 DIAGNOSIS — E210 Primary hyperparathyroidism: Secondary | ICD-10-CM | POA: Insufficient documentation

## 2018-07-17 DIAGNOSIS — Z87891 Personal history of nicotine dependence: Secondary | ICD-10-CM

## 2018-07-17 DIAGNOSIS — E213 Hyperparathyroidism, unspecified: Secondary | ICD-10-CM | POA: Diagnosis not present

## 2018-07-17 DIAGNOSIS — Z122 Encounter for screening for malignant neoplasm of respiratory organs: Secondary | ICD-10-CM

## 2018-07-17 LAB — ALBUMIN: Albumin: 4.7 g/dL (ref 3.5–5.2)

## 2018-07-17 LAB — BASIC METABOLIC PANEL
BUN: 14 mg/dL (ref 6–23)
CO2: 25 mEq/L (ref 19–32)
Calcium: 10.8 mg/dL — ABNORMAL HIGH (ref 8.4–10.5)
Chloride: 106 mEq/L (ref 96–112)
Creatinine, Ser: 1.22 mg/dL — ABNORMAL HIGH (ref 0.40–1.20)
GFR: 43.11 mL/min — ABNORMAL LOW (ref >60.00)
Glucose, Bld: 88 mg/dL (ref 70–99)
Potassium: 3.9 mEq/L (ref 3.5–5.1)
Sodium: 141 mEq/L (ref 135–145)

## 2018-07-17 LAB — VITAMIN D 25 HYDROXY (VIT D DEFICIENCY, FRACTURES): VITD: 52.62 ng/mL (ref 30.00–100.00)

## 2018-07-17 NOTE — Progress Notes (Signed)
Name: Crystal Carroll  MRN/ DOB: [id number], March 21, 1945    Age/ Sex: 73 y.o., female    PCP: Marin Olp, MD   Reason for Endocrinology Evaluation: Hypercalcemia/ Osteoporosis      Date of Initial Endocrinology Evaluation: 07/17/2018     HPI: Ms. MAURIA Carroll is a 73 y.o. female with a past medical history of GERD,and  gait instability . The patient presented for initial endocrinology clinic visit on 07/17/2018 for consultative assistance with her Hypercalcemia    Hypercalcemia History :  [name] has been noted to have intermittent hypercalcemia since 2008 but this has become more persistent in 05/2018, at which time this was found on routine blood work. Since that time, she denies experienced symptoms of constipation, polyuria, polydipsia, generalized weakness, diffuse muscle pains, significant memory impairment. She denies use of over the counter calcium (including supplements, Tums, Rolaids, or other calcium containing antacids), lithium,or  HCTZ.   She is vitamin D supplements.   She denies history of kidney stones, kidney disease, liver disease, granulomatous disease. She does have osteoporosis but no prior fractures. Daily dietary calcium intake: 0 servings. She denies family history of osteoporosis, parathyroid disease, thyroid disease.   Osteoporosis History : Pt was diagnosed with osteoporosis:2019  Menarche at age :  Menopausal at age :  Fracture Hx: no Hx of HRT:  FH of osteoporosis or hip fracture: no Prior Hx of anti-estrogenic therapy :no Prior Hx of anti-resorptive therapy : Fosamax 05/2017 , pt admits to non-compliance with this and does not take it as much.      HISTORY:  Past Medical History:  Past Medical History:  Diagnosis Date  . Anxiety   . GERD (gastroesophageal reflux disease)   . Hyperlipidemia   . Hypertension    Past Surgical History:  Past Surgical History:  Procedure Laterality Date  . CERVICAL LAMINECTOMY  2004   Earle Gell       Social History:  reports that she quit smoking about 5 years ago. Her smoking use included cigarettes. She has a 37.50 pack-year smoking history. She has never used smokeless tobacco. She reports current alcohol use. She reports that she does not use drugs.  Family History: family history includes Heart attack (age of onset: 66) in her father; Melanoma in her mother.   HOME MEDICATIONS: Allergies as of 07/17/2018      Reactions   Codeine Nausea Only   Sulfamethoxazole    REACTION: GI upset      Medication List       Accurate as of July 17, 2018  2:09 PM. If you have any questions, ask your nurse or doctor.        alendronate 70 MG tablet Commonly known as: FOSAMAX Take 1 tablet (70 mg total) by mouth once a week. Take with a full glass of water on an empty stomach.   ALPRAZolam 0.25 MG tablet Commonly known as: XANAX TAKE (1) TABLET DAILY AS NEEDED.   aspirin EC 81 MG tablet Take 81 mg by mouth daily.   buPROPion 300 MG 24 hr tablet Commonly known as: WELLBUTRIN XL TAKE 1 TABLET ONCE DAILY.   cholecalciferol 25 MCG (1000 UT) tablet Commonly known as: VITAMIN D3 Take 1,000 Units by mouth daily.   citalopram 20 MG tablet Commonly known as: CELEXA TAKE 1 TABLET ONCE DAILY.   diclofenac sodium 1 % Gel Commonly known as: VOLTAREN Apply 2 g topically 4 (four) times daily.   gabapentin 100 MG capsule  Commonly known as: NEURONTIN Take 2 capsules (200 mg total) by mouth at bedtime. What changed: how much to take   nystatin-triamcinolone cream Commonly known as: MYCOLOG II Apply 1 application topically 3 (three) times daily as needed. Reported on 04/18/2015   pantoprazole 20 MG tablet Commonly known as: PROTONIX Take 1 tablet (20 mg total) by mouth daily.   rosuvastatin 40 MG tablet Commonly known as: CRESTOR TAKE 1 TABLET ONCE DAILY.   TART CHERRY ADVANCED PO Take by mouth.   TURMERIC PO Take by mouth.   VITAMIN B-12 PO Take by mouth.          REVIEW OF SYSTEMS: A comprehensive ROS was conducted with the patient and is negative except as per HPI and below:  Review of Systems  HENT: Negative for congestion and sore throat.   Respiratory: Negative for cough and shortness of breath.   Cardiovascular: Positive for palpitations. Negative for chest pain.  Gastrointestinal: Negative for diarrhea and nausea.  Genitourinary: Positive for frequency.  Musculoskeletal: Positive for joint pain.  Neurological: Positive for dizziness. Negative for headaches.  Endo/Heme/Allergies: Negative for polydipsia.  Psychiatric/Behavioral: Negative for depression. The patient is nervous/anxious.        OBJECTIVE:  VS: BP 128/78 (BP Location: Left Arm, Patient Position: Sitting, Cuff Size: Normal)   Pulse 67   Temp 97.7 F (36.5 C)   Ht 5\' 4"  (1.626 m)   Wt 149 lb 6.4 oz (67.8 kg)   LMP  (LMP Unknown)   SpO2 97%   BMI 25.64 kg/m    Wt Readings from Last 3 Encounters:  07/17/18 149 lb 6.4 oz (67.8 kg)  06/18/18 149 lb (67.6 kg)  05/22/18 151 lb (68.5 kg)     EXAM: General: Pt appears well and is in NAD  Hydration: Well-hydrated with moist mucous membranes and good skin turgor  Eyes: External eye exam normal without stare, lid lag or exophthalmos.  EOM intact.  PERRL.  Ears, Nose, Throat: Hearing: Grossly intact bilaterally Dental: Good dentition  Throat: Clear without mass, erythema or exudate  Neck: General: Supple without adenopathy. Thyroid: Thyroid size normal.  No goiter or nodules appreciated. No thyroid bruit.  Lungs: Clear with good BS bilat with no rales, rhonchi, or wheezes  Heart: Auscultation: RRR.  Abdomen: Normoactive bowel sounds, soft, nontender, without masses or organomegaly palpable  Extremities:  BL LE: No pretibial edema normal ROM and strength.  Skin: Hair: Texture and amount normal with gender appropriate distribution Skin Inspection: No rashes Skin Palpation: Skin temperature, texture, and thickness normal  to palpation  Neuro: Cranial nerves: II - XII grossly intact  Motor: Normal strength throughout DTRs: 2+ and symmetric in UE without delay in relaxation phase  Mental Status: Judgment, insight: Intact Orientation: Oriented to time, place, and person Mood and affect: No depression, anxiety, or agitation     DATA REVIEWED:   Results for RHONDA, LINAN (MRN [id number]) as of 07/17/2018 16:45  Ref. Range 07/17/2018 14:33  Sodium Latest Ref Range: 135 - 145 mEq/L 141  Potassium Latest Ref Range: 3.5 - 5.1 mEq/L 3.9  Chloride Latest Ref Range: 96 - 112 mEq/L 106  CO2 Latest Ref Range: 19 - 32 mEq/L 25  Glucose Latest Ref Range: 70 - 99 mg/dL 88  BUN Latest Ref Range: 6 - 23 mg/dL 14  Creatinine Latest Ref Range: 0.40 - 1.20 mg/dL 1.22 (H)  Calcium Latest Ref Range: 8.4 - 10.5 mg/dL 10.8 (H)  Albumin Latest Ref Range: 3.5 - 5.2  g/dL 4.7  GFR Latest Ref Range: >60.00 mL/min 43.11 (L)  VITD Latest Ref Range: 30.00 - 100.00 ng/mL 52.62   ASSESSMENT/PLAN/RECOMMENDATIONS:   1.Hypercalcemia :  - Pt is asymptomatic  - Corrected calcium on today's labs is 10.24 mg/dL which is normal and  this is not unusual in hypercalcemia, serum ionized calcium elevated on 06/18/2018 with inappropriately normal PTH confirms PTH -mediated hypercalcemia.  - We discussed differential of familial hypercalcinuric hypercalcemia ([place]) vs Primary Hyperparathyroidism (pHPT)  - It is important to differentiate between the two, as [place] does not cause any organ damage and does not require further follow up , on the other hand pHPT could cause end organ damage and would require further evaluation for assessment of surgical candidacy - Will proceed with 24-hr urine collection   - Encouraged hydration - Avoid OTC calcium  - Maintain 2-3 servings of calcium in the diet  - Continue Vitamin D1000 iu daily     2. Osteoporosis :  - Given her non-compliance with this therapy and her GERD symptoms, I advised her to stop it,  she will benefit from reclast or prolia .  - In the setting of  Primary hyperparathyroidism ( this has not been confirmed yet in her case), the first line of treatment of osteoporosis  is parathyroidectomy. Typically an improvement in the bone density is expected to occur within 6 months postoperatively.  - Will make a final determination after 24- hr urine collection results.  - In the mean time she was encouraged to consume 2-3 servings of calcium a day and continue Vitamin D 3.     F/u in 3 months     Signed electronically by: Mack Guise, MD  [facility] End[facility]  [facility] [address] [place], [place] [address] Phone: [phone] FAX: [phone]   CC: Marin Olp, [place] [address] Phone: [phone] Fax: [phone]   Return to Endocrinology clinic as below: Future Appointments  Date Time Provider [place]  07/20/2018  2:00 PM Hosie Spangle [facility]  07/22/2018  2:00 PM Hosie Spangle [facility]  07/27/2018  2:00 PM Hosie Spangle [facility]  07/28/2018  2:50 PM Pieter Partridge, DO [facility] None  07/29/2018  2:00 PM Hosie Spangle [facility]  12/15/2018  1:00 PM Marin Olp, MD [facility]

## 2018-07-17 NOTE — Patient Instructions (Addendum)
-   Please stay hydrated  - Avoid over the counter calcium  - Maintain calcium in your diet 2-3 servings of calcium daily  FOODS THAT CONTAIN CALCIUM  Calcium can be found in many foods, not only in dairy products.  Dairy Foods  Yogurt (1 cup) 350 mg  Milk (1 cup) 300 mg  Cheddar cheese (1 oz.) 204 mg  Ricotta cheese, part skim (1/4 cup) 169 mg  Cottage cheese (1 cup) 150 mg  Nondairy Foods  Whole Grain Total cereal (3/4 cup) 1000 mg  Pink salmon with bones, sardines (3 oz., cooked) 181 mg  Black beans (1 cup) 103 mg  Broccoli (1 cup, cooked) 150 mg  Almonds (1 tbsp.) 50 mg  Soy Products  Soy yogurt with calcium (3/4 cup) 300 mg  Soy milk enriched with calcium (1 cup) 300 mg  Tofu, firm or extra firm (1/4 cup) 250 mg  Soy nuts, roasted/salted (1/2 cup) 103 mg   24-Hour Urine Collection   You will be collecting your urine for a 24-hour period of time.  Your timer starts with your first urine of the morning (For example - If you first pee at [place], your timer will start at [place])  [place] away your first urine of the morning  Collect your urine every time you pee for the next 24 hours STOP your urine collection 24 hours after you started the collection (For example - You would stop at 9AM the day after you started)

## 2018-07-19 ENCOUNTER — Encounter: Payer: Self-pay | Admitting: Internal Medicine

## 2018-07-20 ENCOUNTER — Other Ambulatory Visit (INDEPENDENT_AMBULATORY_CARE_PROVIDER_SITE_OTHER): Payer: PPO

## 2018-07-20 ENCOUNTER — Other Ambulatory Visit: Payer: Self-pay

## 2018-07-20 ENCOUNTER — Ambulatory Visit: Payer: PPO | Admitting: Physical Therapy

## 2018-07-20 ENCOUNTER — Encounter: Payer: Self-pay | Admitting: Physical Therapy

## 2018-07-20 DIAGNOSIS — M545 Low back pain, unspecified: Secondary | ICD-10-CM

## 2018-07-20 DIAGNOSIS — R262 Difficulty in walking, not elsewhere classified: Secondary | ICD-10-CM

## 2018-07-20 LAB — PTH, INTACT AND CALCIUM
Calcium: 11.2 mg/dL — ABNORMAL HIGH (ref 8.6–10.4)
PTH: 37 pg/mL (ref 14–64)

## 2018-07-20 NOTE — Therapy (Signed)
[place], [place], [address] Phone: [phone]   Fax:  [phone]  Physical Therapy Treatment  Patient Details  Name: Crystal Carroll MRN: [id number] Date of Birth: 22-Dec-1945 Referring Provider (PT): Hulan Saas, DO   Encounter Date: 07/20/2018  PT End of Session - 07/20/18 1446    Visit Number  7    Number of Visits  12    Date for PT Re-Evaluation  08/05/18    Authorization Type  [employer], Progress note by visit 10 and add KX at 33    PT Start Time  1417    PT Stop Time  1455    PT Time Calculation (min)  38 min    Activity Tolerance  Patient tolerated treatment well    Behavior During Therapy  [facility] for tasks assessed/performed       Past Medical History:  Diagnosis Date  . Anxiety   . GERD (gastroesophageal reflux disease)   . Hyperlipidemia   . Hypertension     Past Surgical History:  Procedure Laterality Date  . CERVICAL LAMINECTOMY  2004   Earle Gell    There were no vitals filed for this visit.  Subjective Assessment - 07/20/18 1405    Subjective  Pt. reports had muscle soreness in thighs after last last sessions but reports improving with pain which brought her to therapy.    Currently in Pain?  Yes    Pain Score  5     Pain Location  Leg    Pain Orientation  Right;Left;Upper    Pain Descriptors / Indicators  Sharp    Pain Type  Chronic pain    Pain Onset  More than a month ago    Pain Frequency  Intermittent    Aggravating Factors   sit>stand, activity, walking    Pain Relieving Factors  rest         [facility] PT Assessment - 07/20/18 0001      Static Standing Balance   Static Standing - Comment/# of Minutes  SLS unable past 2-3 sec ea. bilat. with 3 attempts, Romberg foam feet 5 in. apart x 2 attempts: 6 sec first attempt, 16 sec second                   [facility] Adult PT Treatment/Exercise - 07/20/18 0001      Lumbar Exercises:  Stretches   Double Knee to Chest Stretch Limitations  x 15 reps AAROM with legs on 65 cm ball      Lumbar Exercises: Aerobic   Nustep  L3 x 5 min UE/LE      Lumbar Exercises: Supine   Pelvic Tilt  15 reps    Bridge Limitations  12 reps      Knee/Hip Exercises: Public affairs consultant Limitations  R quad + hip flexor stretch in supine with RLE off edge of mat      Knee/Hip Exercises: Standing   Heel Raises  Both;20 reps    Heel Raises Limitations  Airex for balance    Rocker Board  2 minutes    Rocker Board Limitations  1 min ea. lateral and fw/rev dynamic balance with blue board    Other Standing Knee Exercises  alt. toe taps to 6" step for balance x 20 reps    Other Standing Knee Exercises  Romberg foam EC 20 sec x 3      Manual Therapy   Manual Therapy  Soft  tissue mobilization    Soft tissue mobilization  STM/IASTM right quadricep and IT band incl. foam roll use             PT Education - 07/20/18 1443    Education Details  muscular pain/soreness vs. radicular pain    Person(s) Educated  Patient    Methods  Explanation    Comprehension  Verbalized understanding       PT Short Term Goals - 07/20/18 1448      PT SHORT TERM GOAL #1   Title  Independent with HEP    Baseline  met for initial HEP    Time  3    Period  Weeks    Status  Achieved    Target Date  07/15/18      PT SHORT TERM GOAL #2   Title  Improve TUG to <14 sec for decreased fall risk    Baseline  16 sec    Time  3    Period  Weeks    Status  On-going        PT Long Term Goals - 07/20/18 1449      PT LONG TERM GOAL #1   Title  Improve FOTO score to 33% or less impairment    Baseline  56% limited at eval    Time  6    Period  Weeks    Status  On-going      PT LONG TERM GOAL #2   Title  Right knee ext strength 5/5 to improve ability for stair navigation at home and transfers from low chairs    Baseline  4+/5, not retested today    Time  6    Period  Weeks    Status  On-going       PT LONG TERM GOAL #3   Title  Improve Romberg foam, EC to 10 sec or greater for improved balance to decrease fall risk    Baseline  required 2 attempts but able >10 sec second attempt with feet 5 in. apart, continue goal for 1 attempt feet 5 in. apart on foam with EC    Time  6    Period  Weeks    Status  On-going      PT LONG TERM GOAL #4   Title  Tolerate standing/ambulation for periods at least 30-40 min to go grocery shopping with thigh pain 2/10 or less    Baseline  5/10    Time  6    Period  Weeks    Status  On-going            Plan - 07/20/18 1447    Clinical Impression Statement  Some difficulty differentiating muscular vs. potential radicular pain in thighs but based on local tenderness and post-tx. soreness which resolves in several days suspect may be more muscular. Still challenged with static/dynamic balance activities with mild improvements noted from baseline.    Personal Factors and Comorbidities  Age;Comorbidity 3+;Other    Comorbidities  anxiety, HTN, HLD, surgical history for neck    Examination-Activity Limitations  Transfers;Squat;Bed Mobility;Lift;Stairs;Locomotion Level;Stand    Examination-Participation Restrictions  Cleaning;Community Activity    Stability/Clinical Decision Making  Evolving/Moderate complexity    Clinical Decision Making  Moderate    Rehab Potential  Good    PT Frequency  2x / week    PT Duration  6 weeks    PT Treatment/Interventions  ADLs/Self Care Home Management;Cryotherapy;Moist Heat;Balance training;Gait training;Stair training;Therapeutic exercise;Therapeutic activities;Functional mobility training;Neuromuscular re-education;Patient/family education;Manual  techniques;Dry needling    PT Next Visit Plan  No estim due to heart monitor, recheck FOTO, continue/progress flexion bias trunk ROM and core + LE functional strengthening, work on balance with SLS, Romberg, tandem stance and reaching outside BOS, stretch hamstrings    PT Home  Exercise Plan  Pelvic tilt, SKTC, marches, hip bridge, sit<>stand, Romberg feet apart at counter on even surface    Consulted and Agree with Plan of Care  Patient       Patient will benefit from skilled therapeutic intervention in order to improve the following deficits and impairments:  Abnormal gait, Decreased balance, Decreased mobility, Pain, Impaired flexibility, Decreased strength, Decreased activity tolerance, Decreased range of motion, Difficulty walking  Visit Diagnosis: 1. Low back pain, unspecified back pain laterality, unspecified chronicity, unspecified whether sciatica present   2. Difficulty in walking, not elsewhere classified        Problem List Patient Active Problem List   Diagnosis Date Noted  . Hyperparathyroidism ([place]) 07/17/2018  . History of lacunar cerebrovascular accident (CVA) 06/18/2018  . Osteoporosis 03/13/2018  . Patellofemoral arthritis 03/05/2018  . DDD (degenerative disc disease), lumbar 03/05/2018  . Unstable gait 01/20/2018  . Aortic atherosclerosis ([place]) 06/12/2017  . Palpitations 05/12/2017  . Vitamin B12 deficiency 05/12/2017  . Cervical radiculopathy 04/03/2016  . Caregiver burden 04/03/2016  . Essential tremor 04/18/2015  . Adenomatous colon polyp 06/14/2014  . Diarrhea 04/14/2014  . Heavy alcohol use 04/14/2014  . H/O cold sores 10/15/2013  . CKD (chronic kidney disease), stage III ([place]) 10/15/2013  . Hypercalcemia 01/28/2007  . TOBACCO USE 12/24/2006  . Hyperlipidemia 05/26/2006  . Anxiety state 05/26/2006  . Essential hypertension 05/26/2006  . GERD 05/26/2006    [name], PT, DPT 07/20/18 2:51 PM  [place] [facility] [address] [place], [place], [address] Phone: [phone]   Fax:  [phone]  Name: DALEISA HALPERIN MRN: [id number] Date of Birth: [birth year]

## 2018-07-22 ENCOUNTER — Other Ambulatory Visit: Payer: Self-pay

## 2018-07-22 ENCOUNTER — Ambulatory Visit: Payer: PPO | Admitting: Physical Therapy

## 2018-07-22 ENCOUNTER — Encounter: Payer: Self-pay | Admitting: Physical Therapy

## 2018-07-22 DIAGNOSIS — M545 Low back pain, unspecified: Secondary | ICD-10-CM

## 2018-07-22 DIAGNOSIS — R262 Difficulty in walking, not elsewhere classified: Secondary | ICD-10-CM

## 2018-07-22 LAB — CALCIUM, URINE, 24 HOUR: Calcium, 24H Urine: 205 mg/24 h

## 2018-07-22 LAB — CREATININE, URINE, 24 HOUR: Creatinine, 24H Ur: 0.74 g/(24.h) (ref 0.50–2.15)

## 2018-07-22 NOTE — Therapy (Signed)
[place], [place], [address] Phone: [phone]   Fax:  [phone]  Physical Therapy Treatment  Patient Details  Name: Crystal Carroll MRN: [id number] Date of Birth: 05-26-1945 Referring Provider (PT): Hulan Saas, DO   Encounter Date: 07/22/2018  PT End of Session - 07/22/18 1407    Visit Number  7   pt. arrived and cancelled visit-see subjective, visit not included in visit count (7 attended prior to today's session)   Number of Visits  12    Date for PT Re-Evaluation  08/05/18    Authorization Type  [employer], Progress note by visit 10 and add KX at 22    PT Start Time  1409    PT Stop Time  1413    PT Time Calculation (min)  4 min       Past Medical History:  Diagnosis Date  . Anxiety   . GERD (gastroesophageal reflux disease)   . Hyperlipidemia   . Hypertension     Past Surgical History:  Procedure Laterality Date  . CERVICAL LAMINECTOMY  2004   Earle Gell    There were no vitals filed for this visit.  Subjective Assessment - 07/22/18 1414    Subjective  Pt. arrived for visit as scheduled/reports therapy helping but stated that she wished to cancel session today due to being too busy dealing with some family-related reasons. Will resume therapy as scheduled next week.                                 PT Short Term Goals - 07/20/18 1448      PT SHORT TERM GOAL #1   Title  Independent with HEP    Baseline  met for initial HEP    Time  3    Period  Weeks    Status  Achieved    Target Date  07/15/18      PT SHORT TERM GOAL #2   Title  Improve TUG to <14 sec for decreased fall risk    Baseline  16 sec    Time  3    Period  Weeks    Status  On-going        PT Long Term Goals - 07/20/18 1449      PT LONG TERM GOAL #1   Title  Improve FOTO score to 33% or less impairment    Baseline  56% limited at eval    Time  6    Period   Weeks    Status  On-going      PT LONG TERM GOAL #2   Title  Right knee ext strength 5/5 to improve ability for stair navigation at home and transfers from low chairs    Baseline  4+/5, not retested today    Time  6    Period  Weeks    Status  On-going      PT LONG TERM GOAL #3   Title  Improve Romberg foam, EC to 10 sec or greater for improved balance to decrease fall risk    Baseline  required 2 attempts but able >10 sec second attempt with feet 5 in. apart, continue goal for 1 attempt feet 5 in. apart on foam with EC    Time  6    Period  Weeks    Status  On-going      PT LONG  TERM GOAL #4   Title  Tolerate standing/ambulation for periods at least 30-40 min to go grocery shopping with thigh pain 2/10 or less    Baseline  5/10    Time  6    Period  Weeks    Status  On-going              Patient will benefit from skilled therapeutic intervention in order to improve the following deficits and impairments:     Visit Diagnosis: 1. Low back pain, unspecified back pain laterality, unspecified chronicity, unspecified whether sciatica present   2. Difficulty in walking, not elsewhere classified        Problem List Patient Active Problem List   Diagnosis Date Noted  . Hyperparathyroidism ([place]) 07/17/2018  . History of lacunar cerebrovascular accident (CVA) 06/18/2018  . Osteoporosis 03/13/2018  . Patellofemoral arthritis 03/05/2018  . DDD (degenerative disc disease), lumbar 03/05/2018  . Unstable gait 01/20/2018  . Aortic atherosclerosis ([place]) 06/12/2017  . Palpitations 05/12/2017  . Vitamin B12 deficiency 05/12/2017  . Cervical radiculopathy 04/03/2016  . Caregiver burden 04/03/2016  . Essential tremor 04/18/2015  . Adenomatous colon polyp 06/14/2014  . Diarrhea 04/14/2014  . Heavy alcohol use 04/14/2014  . H/O cold sores 10/15/2013  . CKD (chronic kidney disease), stage III ([place]) 10/15/2013  . Hypercalcemia 01/28/2007  . TOBACCO USE 12/24/2006  .  Hyperlipidemia 05/26/2006  . Anxiety state 05/26/2006  . Essential hypertension 05/26/2006  . GERD 05/26/2006    [name], PT, DPT 07/22/18 2:19 PM  [place] [facility] [address] [place], [place], [address] Phone: [phone]   Fax:  [phone]  Name: Crystal Carroll MRN: [id number] Date of Birth: 1945-11-06

## 2018-07-27 ENCOUNTER — Other Ambulatory Visit: Payer: Self-pay

## 2018-07-27 ENCOUNTER — Telehealth: Payer: Self-pay | Admitting: Internal Medicine

## 2018-07-27 ENCOUNTER — Encounter: Payer: Self-pay | Admitting: Physical Therapy

## 2018-07-27 ENCOUNTER — Ambulatory Visit: Payer: PPO | Admitting: Physical Therapy

## 2018-07-27 DIAGNOSIS — M545 Low back pain, unspecified: Secondary | ICD-10-CM

## 2018-07-27 DIAGNOSIS — E213 Hyperparathyroidism, unspecified: Secondary | ICD-10-CM

## 2018-07-27 DIAGNOSIS — R262 Difficulty in walking, not elsewhere classified: Secondary | ICD-10-CM

## 2018-07-27 NOTE — Progress Notes (Signed)
NEUROLOGY CONSULTATION NOTE  Crystal Carroll MRN: [id number] DOB: 12-Feb-1945  Referring provider: [name], MD Primary care provider: [name], MD  Reason for consult:  Unsteady gait  HISTORY OF PRESENT ILLNESS: Crystal Carroll is a 73 year old woman who presents for unsteady gait.  History supplemented by referring provider notes.  In early January, she had an episode of low back pain, right leg numbness and unsteady gait.  CT of head from 01/21/18 was personally reviewed and negative for acute intracranial process, such as stroke.  She did have associated low back pain, so lumbar radiculopathy was suspected and she was treated with prednisone taper with improvement.  Lumbar x-ray from 02/20/18 showed mild spondylosis with subtle grade 1 anteriolisthesis of L4 on L5 due to moderate facet arthropathy.  She was started on physical therapy as well as gabapentin.  She stopped gabapentin because she didn't like the way it made her feel.  She was found to have patellofemoral arthritis.  Although overall improved, symptoms had not completely resolved.  B12 from 05/22/18 was 789.  MRI of brain from 05/29/18 was personally reviewed and demonstrated mild atrophy and mild chronic small vessel ischemic changes with remote lacunar infarct in the left frontal periventricular white matter but no acute infarcts.  She now has no back pain but notes pain in both legs down the sides at the hips and anterior thigh and lower legs.  No numbness.  No slurred speech, dizziness or double vision.  She has history of cervical spinal stenosis at C5-C7 and underwent surgery with titanium implant by Dr. Arnoldo Morale of neurosurgery in 2003.  In 2018, she had a repeat MRI of cervical spine for radiculopathy which showed  C4-C7 ACDF as well as mild spinal stenosis at C3-4 and C7-T1 and neural foraminal stenosis at C6-7 on the right and C3-4 bilaterally.  PAST MEDICAL HISTORY: Past Medical History:  Diagnosis Date  .  Anxiety   . GERD (gastroesophageal reflux disease)   . Hyperlipidemia   . Hypertension     PAST SURGICAL HISTORY: Past Surgical History:  Procedure Laterality Date  . CERVICAL LAMINECTOMY  2004   Earle Gell    MEDICATIONS: Current Outpatient Medications on File Prior to Visit  Medication Sig Dispense Refill  . alendronate (FOSAMAX) 70 MG tablet Take 1 tablet (70 mg total) by mouth once a week. Take with a full glass of water on an empty stomach. 5 tablet 11  . ALPRAZolam (XANAX) 0.25 MG tablet TAKE (1) TABLET DAILY AS NEEDED. 30 tablet 0  . aspirin EC 81 MG tablet Take 81 mg by mouth daily.    Marland Kitchen buPROPion (WELLBUTRIN XL) 300 MG 24 hr tablet TAKE 1 TABLET ONCE DAILY. 90 tablet 0  . cholecalciferol (VITAMIN D3) 25 MCG (1000 UT) tablet Take 1,000 Units by mouth daily.    . citalopram (CELEXA) 20 MG tablet TAKE 1 TABLET ONCE DAILY. 90 tablet 0  . Cyanocobalamin (VITAMIN B-12 PO) Take by mouth.    . diclofenac sodium (VOLTAREN) 1 % GEL Apply 2 g topically 4 (four) times daily. 100 g 3  . gabapentin (NEURONTIN) 100 MG capsule Take 2 capsules (200 mg total) by mouth at bedtime. (Patient taking differently: Take 100 mg by mouth at bedtime. ) 60 capsule 3  . Misc Natural Products (TART CHERRY ADVANCED PO) Take by mouth.    . nystatin-triamcinolone (MYCOLOG II) cream Apply 1 application topically 3 (three) times daily as needed. Reported on 04/18/2015    .  pantoprazole (PROTONIX) 20 MG tablet Take 1 tablet (20 mg total) by mouth daily. 90 tablet 3  . rosuvastatin (CRESTOR) 40 MG tablet TAKE 1 TABLET ONCE DAILY. 90 tablet 1  . TURMERIC PO Take by mouth.     No current facility-administered medications on file prior to visit.     ALLERGIES: Allergies  Allergen Reactions  . Codeine Nausea Only  . Sulfamethoxazole     REACTION: GI upset    FAMILY HISTORY: Family History  Problem Relation Age of Onset  . Heart attack Father 12       smoker, rheumatic fever as child  . Melanoma Mother         labia   SOCIAL HISTORY: Social History   Socioeconomic History  . Marital status: Married    Spouse name: Not on file  . Number of children: 1  . Years of education: Not on file  . Highest education level: Not on file  Occupational History  . Not on file  Social Needs  . Financial resource strain: Not on file  . Food insecurity    Worry: Not on file    Inability: Not on file  . Transportation needs    Medical: Not on file    Non-medical: Not on file  Tobacco Use  . Smoking status: Former Smoker    Packs/day: 0.75    Years: 50.00    Pack years: 37.50    Types: Cigarettes    Quit date: 03/07/2013    Years since quitting: 5.3  . Smokeless tobacco: Never Used  Substance and Sexual Activity  . Alcohol use: Yes    Alcohol/week: 0.0 standard drinks    Comment: 2 per day  . Drug use: No  . Sexual activity: Not on file  Lifestyle  . Physical activity    Days per week: Not on file    Minutes per session: Not on file  . Stress: Not on file  Relationships  . Social Herbalist on phone: Not on file    Gets together: Not on file    Attends religious service: Not on file    Active member of club or organization: Not on file    Attends meetings of clubs or organizations: Not on file    Relationship status: Not on file  . Intimate partner violence    Fear of current or ex partner: Not on file    Emotionally abused: Not on file    Physically abused: Not on file    Forced sexual activity: Not on file  Other Topics Concern  . Not on file  Social History Narrative   Married to [facility] (patient) in 1981. 2nd marriage-1 child with 1 adopted grandchild.       Retired as Statistician.       Hobbies: antiques, former Firefighter      Exercise: none currently.           REVIEW OF SYSTEMS: Constitutional: No fevers, chills, or sweats, no generalized fatigue, change in appetite Eyes: No visual changes, double vision, eye pain Ear, nose and  throat: No hearing loss, ear pain, nasal congestion, sore throat Cardiovascular: No chest pain, palpitations Respiratory:  No shortness of breath at rest or with exertion, wheezes GastrointestinaI: No nausea, vomiting, diarrhea, abdominal pain, fecal incontinence Genitourinary:  No dysuria, urinary retention or frequency Musculoskeletal:  No neck pain, back pain Integumentary: No rash, pruritus, skin lesions Neurological: as above Psychiatric: No depression, insomnia,  anxiety Endocrine: No palpitations, fatigue, diaphoresis, mood swings, change in appetite, change in weight, increased thirst Hematologic/Lymphatic:  No purpura, petechiae. Allergic/Immunologic: no itchy/runny eyes, nasal congestion, recent allergic reactions, rashes  PHYSICAL EXAM: Blood pressure 115/72, pulse 66, temperature 99.1 F (37.3 C), temperature source Oral, height 5' 4.5" (1.638 m), weight 148 lb (67.1 kg), SpO2 97 %. General: No acute distress.  Patient appears well-groomed. Head:  Normocephalic/atraumatic Eyes:  fundi examined but not visualized Neck: supple, no paraspinal tenderness, full range of motion Back: No paraspinal tenderness Heart: regular rate and rhythm Lungs: Clear to auscultation bilaterally. Vascular: No carotid bruits. Neurological Exam: Mental status: alert and oriented to person, place, and time, recent and remote memory intact, fund of knowledge intact, attention and concentration intact, speech fluent and not dysarthric, language intact. Cranial nerves: CN I: not tested CN II: pupils equal, round and reactive to light, visual fields intact CN III, IV, VI:  full range of motion, no nystagmus, no ptosis CN V: facial sensation intact CN VII: upper and lower face symmetric CN VIII: hearing intact CN IX, X: gag intact, uvula midline CN XI: sternocleidomastoid and trapezius muscles intact CN XII: tongue midline Bulk & Tone: normal, no fasciculations. Motor:  5/5 throughout (very mild  give-way weakness in right hip flexion). Sensation:  Pinprick and vibration sensation intact. Deep Tendon Reflexes:  3+ in right patellar, otherwise, 2+ throughout, toes downgoing. Finger to nose testing:  Without dysmetria.   Heel to shin:  Without dysmetria.   Gait:  Wide-based gait.  Able to turn.  Unable to tandem walk.  Romberg with sway.  IMPRESSION: 1.  Unsteady gait.  Based on her description, it appears that the pain in the legs is what is primarily affecting her gait.  However, she also has some hyperreflexia, prominently in the right patellar.  Unclear if that is new or possibly residual from prior cervical spinal stenosis/myelopathy. I think we need to re-evaluate for possible new cervical spinal stenosis causing myelopathy.  Could leg pain be related to arthritis?  Given the bilateral leg pain, will check CK.   2.  Remote infarct on brain MRI.  I think this is an incidental finding.  PLAN: 1.  MRI of cervical spine 2.  CK 3.  If MRI unremarkable, then may consider nerve study of lower extremities 4.  Agree with ASA 81mg  daily.  Continue statin therapy.  Thank you for allowing me to take part in the care of this patient.  Metta Clines, DO  CC: [name], MD

## 2018-07-27 NOTE — Therapy (Signed)
[place], [place], [address] Phone: [phone]   Fax:  [phone]  Physical Therapy Treatment  Patient Details  Name: Crystal Carroll MRN: [id number] Date of Birth: 02-Dec-1945 Referring Provider (PT): Hulan Saas, DO   Encounter Date: 07/27/2018  PT End of Session - 07/27/18 1406    Visit Number  8    Number of Visits  12    Date for PT Re-Evaluation  08/05/18    Authorization Type  [employer], Progress note by visit 10 and add KX at 54    PT Start Time  1403    PT Stop Time  1447    PT Time Calculation (min)  44 min    Activity Tolerance  Patient tolerated treatment well   sore with lumbar mobilization otherwise tx. well-tolerated   Behavior During Therapy  [facility] for tasks assessed/performed       Past Medical History:  Diagnosis Date  . Anxiety   . GERD (gastroesophageal reflux disease)   . Hyperlipidemia   . Hypertension     Past Surgical History:  Procedure Laterality Date  . CERVICAL LAMINECTOMY  2004   Earle Gell    There were no vitals filed for this visit.  Subjective Assessment - 07/27/18 1449    Subjective  Pt. reports has been having pain and difficulty navigating stairs and requests work on this. She sees neurologist MD tomorrow re: balance issues.    Currently in Pain?  Yes    Pain Score  4     Pain Location  Leg    Pain Orientation  Right;Upper;Lateral    Pain Descriptors / Indicators  Aching;Sharp    Pain Type  Chronic pain    Pain Radiating Towards  right lateral hip and thigh    Pain Onset  More than a month ago    Pain Frequency  Intermittent    Aggravating Factors   standing, walking, stairs    Pain Relieving Factors  rest    Effect of Pain on Daily Activities  difficulty with sit>stand from low chairs, difficulty with stair navigation and with prolonged standing and walking         [facility] PT Assessment - 07/27/18 0001      Observation/Other Assessments   Focus on Therapeutic Outcomes (FOTO)   48% limited      Ambulation/Gait   Gait Comments  assessed stair navigation and pt. was ascending with RLE with single step pattern-instructed in ascending 1 step at a time with LLE leading, descending 1 step at a time with RLE leading with pt. able to ascend/descend 3 steps with SBA excepting min assist to descend last step, stairs performed with hand use on bilat. rails                   [facility] Adult PT Treatment/Exercise - 07/27/18 0001      Lumbar Exercises: Stretches   Passive Hamstring Stretch  Right;Left;2 reps;30 seconds    Double Knee to Chest Stretch Limitations  x 15 reps AAROM with legs on 65 cm P-ball    Lower Trunk Rotation Limitations  x 15 reps gentle assisted stretch with legs on 65 cm P-ball    Pelvic Tilt  15 reps    ITB Stretch  Right;3 reps;30 seconds    Piriformis Stretch  Left;3 reps;30 seconds      Lumbar Exercises: Aerobic   Nustep  L4 x 5 min UE/LE  Manual Therapy   Manual Therapy  Joint mobilization;Soft tissue mobilization    Joint Mobilization  R hip long axis distraction oscillations grade I-III, lumbar sideglides in left sidelying grade I-III    Soft tissue mobilization  STM/IASTM right lateral hip, IT band, quad incl. foam roll use             PT Education - 07/27/18 1456    Education Details  stairs, potential symptoms etiology with muscular vs. radicular pain    Person(s) Educated  Patient    Methods  Explanation;Demonstration;Verbal cues    Comprehension  Verbalized understanding;Returned demonstration       PT Short Term Goals - 07/20/18 1448      PT SHORT TERM GOAL #1   Title  Independent with HEP    Baseline  met for initial HEP    Time  3    Period  Weeks    Status  Achieved    Target Date  07/15/18      PT SHORT TERM GOAL #2   Title  Improve TUG to <14 sec for decreased fall risk    Baseline  16 sec    Time  3    Period  Weeks    Status   On-going        PT Long Term Goals - 07/27/18 1500      PT LONG TERM GOAL #1   Title  Improve FOTO score to 33% or less impairment    Baseline  48% limited    Time  6    Period  Weeks    Status  On-going      PT LONG TERM GOAL #2   Title  Right knee ext strength 5/5 to improve ability for stair navigation at home and transfers from low chairs    Baseline  4+/5 at last assessment, not retested today    Time  6    Period  Weeks    Status  On-going      PT LONG TERM GOAL #3   Title  Improve Romberg foam, EC to 10 sec or greater for improved balance to decrease fall risk    Baseline  not assessed today-at last attempt pt. required 2 attempts but able >10 sec second attempt with feet 5 in. apart, continue goal for 1 attempt feet 5 in. apart on foam with EC    Time  6    Period  Weeks    Status  On-going      PT LONG TERM GOAL #4   Title  Tolerate standing/ambulation for periods at least 30-40 min to go grocery shopping with thigh pain 2/10 or less    Baseline  4/10    Time  6    Period  Weeks            Plan - 07/27/18 1457    Clinical Impression Statement  Added more manual therapy to flexion bias exercises to address muscular discomfort today. Instructed in stairs as noted per flowsheet with pt. able to return demos with min cues. Suspect multi-factoral etiology to pain with stenosis vs. local muscular pain and/or OA-pt. will see neurologist tomorrow for further investigation of balance issues which also suspect are multifactorial. Pt. making fair progress with therapy with functional status as evidenced by FOTO score improvements.    Personal Factors and Comorbidities  Age;Comorbidity 3+;Other    Comorbidities  anxiety, HTN, HLD, surgical history for neck    Examination-Activity Limitations  Transfers;Squat;Bed Mobility;Lift;Stairs;Locomotion  Level;Stand    Examination-Participation Restrictions  Cleaning;Community Activity    Stability/Clinical Decision Making   Evolving/Moderate complexity    Clinical Decision Making  Moderate    Rehab Potential  Good    PT Frequency  2x / week    PT Duration  6 weeks    PT Next Visit Plan  No estim due to heart monitor, continue/progress flexion bias trunk ROM and core + LE functional strengthening, work on balance with SLS, Romberg, tandem stance and reaching outside BOS, stretch hamstrings    PT Home Exercise Plan  Pelvic tilt, SKTC, marches, hip bridge, sit<>stand, Romberg feet apart at counter on even surface    Consulted and Agree with Plan of Care  Patient       Patient will benefit from skilled therapeutic intervention in order to improve the following deficits and impairments:  Abnormal gait, Decreased balance, Decreased mobility, Pain, Impaired flexibility, Decreased strength, Decreased activity tolerance, Decreased range of motion, Difficulty walking  Visit Diagnosis: 1. Low back pain, unspecified back pain laterality, unspecified chronicity, unspecified whether sciatica present   2. Difficulty in walking, not elsewhere classified        Problem List Patient Active Problem List   Diagnosis Date Noted  . Hyperparathyroidism ([place]) 07/17/2018  . History of lacunar cerebrovascular accident (CVA) 06/18/2018  . Osteoporosis 03/13/2018  . Patellofemoral arthritis 03/05/2018  . DDD (degenerative disc disease), lumbar 03/05/2018  . Unstable gait 01/20/2018  . Aortic atherosclerosis ([place]) 06/12/2017  . Palpitations 05/12/2017  . Vitamin B12 deficiency 05/12/2017  . Cervical radiculopathy 04/03/2016  . Caregiver burden 04/03/2016  . Essential tremor 04/18/2015  . Adenomatous colon polyp 06/14/2014  . Diarrhea 04/14/2014  . Heavy alcohol use 04/14/2014  . H/O cold sores 10/15/2013  . CKD (chronic kidney disease), stage III ([place]) 10/15/2013  . Hypercalcemia 01/28/2007  . TOBACCO USE 12/24/2006  . Hyperlipidemia 05/26/2006  . Anxiety state 05/26/2006  . Essential hypertension 05/26/2006  . GERD  05/26/2006    [name], PT, DPT 07/27/18 3:02 PM  [facility] [address] [place], [place], [address] Phone: [phone]   Fax:  [phone]  Name: NASTASSJA WITKOP MRN: [id number] Date of Birth: 04-Apr-1945

## 2018-07-27 NOTE — Telephone Encounter (Signed)
    Ca/ Cr clearance ratio of 0.03 which is consistent with Primary Hyperparathyroidism    Pt meets surgical surgical criteria for parathyroidectomy.     Attempted to call the patient on 07/27/18 at 15:45 with no answer.    Left a message for a call back   A my chart message will be sent     Melanie Crazier [employer] [facility]

## 2018-07-28 ENCOUNTER — Encounter: Payer: Self-pay | Admitting: Neurology

## 2018-07-28 ENCOUNTER — Ambulatory Visit (INDEPENDENT_AMBULATORY_CARE_PROVIDER_SITE_OTHER): Payer: PPO | Admitting: Neurology

## 2018-07-28 ENCOUNTER — Other Ambulatory Visit (INDEPENDENT_AMBULATORY_CARE_PROVIDER_SITE_OTHER): Payer: PPO

## 2018-07-28 VITALS — BP 115/72 | HR 66 | Temp 99.1°F | Ht 64.5 in | Wt 148.0 lb

## 2018-07-28 DIAGNOSIS — Z9889 Other specified postprocedural states: Secondary | ICD-10-CM | POA: Diagnosis not present

## 2018-07-28 DIAGNOSIS — M79605 Pain in left leg: Secondary | ICD-10-CM

## 2018-07-28 DIAGNOSIS — M79604 Pain in right leg: Secondary | ICD-10-CM

## 2018-07-28 DIAGNOSIS — R2681 Unsteadiness on feet: Secondary | ICD-10-CM | POA: Diagnosis not present

## 2018-07-28 DIAGNOSIS — R292 Abnormal reflex: Secondary | ICD-10-CM

## 2018-07-28 NOTE — Telephone Encounter (Signed)
Spoke to the patient on 07/28/18 at 1330  Discussed Ca./cr ratio of 0.03 which is consistent with primary hyperparathyroidism , pt does qualify for surgery based on low GFR and osteoporosis.    Pt in agreement   Will refer to surgery     Abby Nena Jordan, MD  [facility]  [facility] [address] [place], [place] [address] Phone: [phone] FAX: [phone]

## 2018-07-28 NOTE — Patient Instructions (Addendum)
1.  We will check MRI of cervical spine without contrast 2.  We will check CK 3.  Further recommendations pending results.  Your provider has requested that you have labwork completed today. Please go to [facility] ([id number]) on the second floor of this building before leaving the office today. You do not need to check in. If you are not called within 15 minutes please check with the front desk.   We have sent a referral to [facility] for your MRI and they will call you directly to schedule your appointment. They are located at [place]. If you need to contact them directly, or they have not called to schedule your appointment within 3-5 days, please call [phone].

## 2018-07-28 NOTE — Addendum Note (Signed)
Addended by: Clois Comber on: 07/28/2018 03:59 PM   Modules accepted: Orders

## 2018-07-28 NOTE — Telephone Encounter (Signed)
Patient returned a call. Her call back number is [phone]

## 2018-07-29 ENCOUNTER — Encounter: Payer: Self-pay | Admitting: Physical Therapy

## 2018-07-29 ENCOUNTER — Other Ambulatory Visit: Payer: Self-pay

## 2018-07-29 ENCOUNTER — Ambulatory Visit: Payer: PPO | Admitting: Physical Therapy

## 2018-07-29 DIAGNOSIS — R262 Difficulty in walking, not elsewhere classified: Secondary | ICD-10-CM

## 2018-07-29 DIAGNOSIS — M545 Low back pain, unspecified: Secondary | ICD-10-CM

## 2018-07-29 LAB — CK: Total CK: 82 U/L (ref 7–177)

## 2018-07-29 NOTE — Therapy (Signed)
[place], [place], [address] Phone: [phone]   Fax:  [phone]  Physical Therapy Treatment Progress Note Reporting Period 06/24/2018 to 07/29/2018  See note below for Objective Data and Assessment of Progress/Goals.       Patient Details  Name: Crystal Carroll MRN: [id number] Date of Birth: [birth year] Referring Provider (PT): Hulan Saas, DO   Encounter Date: 07/29/2018  PT End of Session - 07/29/18 1532    Visit Number  9    Number of Visits  20    Date for PT Re-Evaluation  09/09/18    Authorization Type  [employer], Next progress note by visit 2 and add KX at 87    PT Start Time  1410   pt. late to arrive for tx.   PT Stop Time  1443    PT Time Calculation (min)  33 min    Activity Tolerance  Patient tolerated treatment well    Behavior During Therapy  WFL for tasks assessed/performed       Past Medical History:  Diagnosis Date  . Anxiety   . GERD (gastroesophageal reflux disease)   . Hyperlipidemia   . Hypertension     Past Surgical History:  Procedure Laterality Date  . CERVICAL LAMINECTOMY  2004   Earle Gell    There were no vitals filed for this visit.  Subjective Assessment - 07/29/18 1418    Subjective  10 min late to start appointment due to pt. running late. Pt. saw neurologist MD re: balance issues and cervical MRI has been ordered to rule out myelopathy given history of surgery/stenosis for cervical region. Pt. continues with intermittent bilateral thigh pain.    Pertinent History  cervical fusion, gait/balance difficulties, anxiety    Limitations  Standing;Walking;Lifting;House hold activities    How long can you sit comfortably?  depends on position but no significant limitations with sitting tolerance    How long can you stand comfortably?  1 hour    How long can you walk comfortably?  varies depending on the day    Diagnostic tests  X-rays     Patient Stated Goals  Improve strength         [facility] PT Assessment - 07/29/18 0001      Strength   Right Knee Extension  5/5                   [facility] Adult PT Treatment/Exercise - 07/29/18 0001      Lumbar Exercises: Stretches   Passive Hamstring Stretch  Right;Left;2 reps;30 seconds    Double Knee to Chest Stretch Limitations  x 15 reps AAROM with therapist assistance    ITB Stretch  Right;2 reps;30 seconds    Other Lumbar Stretch Exercise  seated lumbar flexion stretch roll outs 3 -way diagonals with hands P-ball x 15 reps      Lumbar Exercises: Supine   Pelvic Tilt  15 reps    Bent Knee Raise  15 reps    Other Supine Lumbar Exercises  glut set x 15 reps with legs on reversed incline wedge      Manual Therapy   Joint Mobilization  long axis distraction oscillations grade I-III to bilat. hip    Soft tissue mobilization  STM/IASTM right lateral hip, IT band, quad incl. foam roll use             PT Education - 07/29/18 2049    Education Details  POC  Person(s) Educated  Patient    Methods  Explanation    Comprehension  Verbalized understanding       PT Short Term Goals - 07/29/18 2050      PT SHORT TERM GOAL #1   Title  Independent with HEP    Baseline  met for initial HEP    Time  3    Period  Weeks    Status  Achieved    Target Date  07/15/18      PT SHORT TERM GOAL #2   Title  Improve TUG to <14 sec for decreased fall risk    Baseline  16 sec    Time  3    Period  Weeks    Status  On-going    Target Date  08/19/18        PT Long Term Goals - 07/29/18 2051      PT LONG TERM GOAL #1   Title  Improve FOTO score to 33% or less impairment    Baseline  48% limited    Time  6    Period  Weeks    Status  On-going    Target Date  09/09/18      PT LONG TERM GOAL #2   Title  Right knee ext strength 5/5 to improve ability for stair navigation at home and transfers from low chairs    Baseline  5/5    Time  6    Period  Weeks     Status  Achieved      PT LONG TERM GOAL #3   Title  Improve Romberg foam, EC to 10 sec or greater with feet 5 in. apart on Airex for improved balance to decrease fall risk    Time  6    Period  Weeks    Status  Revised    Target Date  09/09/18      PT LONG TERM GOAL #4   Title  Tolerate standing/ambulation for periods at least 30-40 min to go grocery shopping with thigh pain 2/10 or less    Baseline  4/10, symptoms variable    Time  6    Period  Weeks    Status  On-going    Target Date  09/09/18            Plan - 07/29/18 2050    Clinical Impression Statement  Treatment mildy abbreviated today due to pt. running late. Pt. has made mild improvements from baseline with TUG score and static standing balance. She continues with intermittent thigh pain of unclear etiology with potential differential diagnosis including radiculopathy from lumbar region/stenosis vs. local muscular vs. OA etiology (given knee pain) but also now pending cervical MRI to investigate for potential myelopathy. Given pt. noting benefit from tx. plan continue POC pending further testing to investigate underlying cause.    Personal Factors and Comorbidities  Age;Comorbidity 3+;Other    Comorbidities  anxiety, HTN, HLD, surgical history for neck    Examination-Activity Limitations  Transfers;Squat;Bed Mobility;Lift;Stairs;Locomotion Level;Stand    Examination-Participation Restrictions  Cleaning;Community Activity    Stability/Clinical Decision Making  Evolving/Moderate complexity    Clinical Decision Making  Moderate    Rehab Potential  Good    PT Frequency  2x / week    PT Duration  6 weeks    PT Treatment/Interventions  ADLs/Self Care Home Management;Cryotherapy;Moist Heat;Balance training;Gait training;Stair training;Therapeutic exercise;Therapeutic activities;Functional mobility training;Neuromuscular re-education;Patient/family education;Manual techniques;Dry needling    PT Next Visit Plan  No estim due  to  heart monitor, continue/progress flexion bias trunk ROM and core + LE functional strengthening, work on balance with SLS, Romberg, tandem stance and reaching outside BOS, stretch hamstrings    PT Home Exercise Plan  Pelvic tilt, SKTC, marches, hip bridge, sit<>stand, Romberg feet apart at counter on even surface    Consulted and Agree with Plan of Care  Patient       Patient will benefit from skilled therapeutic intervention in order to improve the following deficits and impairments:  Abnormal gait, Decreased balance, Decreased mobility, Pain, Impaired flexibility, Decreased strength, Decreased activity tolerance, Decreased range of motion, Difficulty walking  Visit Diagnosis: 1. Low back pain, unspecified back pain laterality, unspecified chronicity, unspecified whether sciatica present   2. Difficulty in walking, not elsewhere classified        Problem List Patient Active Problem List   Diagnosis Date Noted  . Hyperparathyroidism ([place]) 07/17/2018  . History of lacunar cerebrovascular accident (CVA) 06/18/2018  . Osteoporosis 03/13/2018  . Patellofemoral arthritis 03/05/2018  . DDD (degenerative disc disease), lumbar 03/05/2018  . Unstable gait 01/20/2018  . Aortic atherosclerosis ([place]) 06/12/2017  . Palpitations 05/12/2017  . Vitamin B12 deficiency 05/12/2017  . Cervical radiculopathy 04/03/2016  . Caregiver burden 04/03/2016  . Essential tremor 04/18/2015  . Adenomatous colon polyp 06/14/2014  . Diarrhea 04/14/2014  . Heavy alcohol use 04/14/2014  . H/O cold sores 10/15/2013  . CKD (chronic kidney disease), stage III ([place]) 10/15/2013  . Hypercalcemia 01/28/2007  . TOBACCO USE 12/24/2006  . Hyperlipidemia 05/26/2006  . Anxiety state 05/26/2006  . Essential hypertension 05/26/2006  . GERD 05/26/2006    [name], PT, DPT 07/29/18 8:59 PM  [place] [facility] [address] [place], [place], [address] Phone: [phone]    Fax:  [phone]  Name: Crystal Carroll MRN: [id number] Date of Birth: [birth year]

## 2018-08-03 DIAGNOSIS — Z1231 Encounter for screening mammogram for malignant neoplasm of breast: Secondary | ICD-10-CM | POA: Diagnosis not present

## 2018-08-03 LAB — HM MAMMOGRAPHY

## 2018-08-04 ENCOUNTER — Telehealth: Payer: Self-pay

## 2018-08-04 NOTE — Telephone Encounter (Signed)
Called spoke with patient she was made aware of results

## 2018-08-04 NOTE — Telephone Encounter (Signed)
-----   Message from Jesse Fall, RN sent at 08/04/2018 10:23 AM EDT -----  ----- Message ----- From: Pieter Partridge, DO Sent: 07/29/2018   1:01 PM EDT To: Clois Comber, CMA  Lab normal

## 2018-08-05 DIAGNOSIS — D225 Melanocytic nevi of trunk: Secondary | ICD-10-CM | POA: Diagnosis not present

## 2018-08-05 DIAGNOSIS — L738 Other specified follicular disorders: Secondary | ICD-10-CM | POA: Diagnosis not present

## 2018-08-05 DIAGNOSIS — D1801 Hemangioma of skin and subcutaneous tissue: Secondary | ICD-10-CM | POA: Diagnosis not present

## 2018-08-05 DIAGNOSIS — L821 Other seborrheic keratosis: Secondary | ICD-10-CM | POA: Diagnosis not present

## 2018-08-05 DIAGNOSIS — D2261 Melanocytic nevi of right upper limb, including shoulder: Secondary | ICD-10-CM | POA: Diagnosis not present

## 2018-08-05 DIAGNOSIS — L720 Epidermal cyst: Secondary | ICD-10-CM | POA: Diagnosis not present

## 2018-08-07 ENCOUNTER — Encounter: Payer: Self-pay | Admitting: Family Medicine

## 2018-08-10 ENCOUNTER — Ambulatory Visit: Payer: PPO | Admitting: Physical Therapy

## 2018-08-12 ENCOUNTER — Ambulatory Visit: Payer: PPO | Admitting: Physical Therapy

## 2018-08-17 ENCOUNTER — Other Ambulatory Visit: Payer: Self-pay

## 2018-08-17 ENCOUNTER — Encounter: Payer: Self-pay | Admitting: Physical Therapy

## 2018-08-17 ENCOUNTER — Ambulatory Visit: Payer: PPO | Attending: Family Medicine | Admitting: Physical Therapy

## 2018-08-17 DIAGNOSIS — R262 Difficulty in walking, not elsewhere classified: Secondary | ICD-10-CM | POA: Diagnosis not present

## 2018-08-17 DIAGNOSIS — M545 Low back pain, unspecified: Secondary | ICD-10-CM

## 2018-08-17 NOTE — Therapy (Signed)
[place], [place], [address] Phone: [phone]   Fax:  [phone]  Physical Therapy Treatment  Patient Details  Name: Crystal Carroll MRN: [id number] Date of Birth: 1945/07/21 Referring Provider (PT): Hulan Saas, DO   Encounter Date: 08/17/2018  PT End of Session - 08/17/18 1501    Visit Number  10    Number of Visits  20    Date for PT Re-Evaluation  09/09/18    Authorization Type  [employer], Next progress note by visit 23 and add KX at 34    PT Start Time  1417    PT Stop Time  1457    PT Time Calculation (min)  40 min    Activity Tolerance  Patient tolerated treatment well    Behavior During Therapy  [facility] for tasks assessed/performed       Past Medical History:  Diagnosis Date  . Anxiety   . GERD (gastroesophageal reflux disease)   . Hyperlipidemia   . Hypertension     Past Surgical History:  Procedure Laterality Date  . CERVICAL LAMINECTOMY  2004   Earle Gell    There were no vitals filed for this visit.  Subjective Assessment - 08/17/18 1420    Subjective  Pt. returns, not seen since 07/29/18. She reports cancelled therapy last week due to "couldn't walk" from right leg pain. Pain was worse over the weekend but today not as bad. No specific exacerbating activities noted. Cervical MRI to rule out myelopathy is scheduled for 09/04/18.    Pertinent History  cervical fusion, gait/balance difficulties, anxiety    Limitations  Standing;Walking;Lifting;House hold activities    Diagnostic tests  X-rays    Patient Stated Goals  Improve strength    Currently in Pain?  Yes    Pain Score  2     Pain Location  Leg    Pain Orientation  Right;Upper;Lateral    Pain Descriptors / Indicators  Aching;Sharp    Pain Type  Chronic pain    Pain Onset  More than a month ago    Pain Frequency  Intermittent    Aggravating Factors   standing, walking, stairs    Pain Relieving Factors   rest    Effect of Pain on Daily Activities  limits standing and walking tolerance, difficulty with sit>stand, stair navigation                       [facility] Adult PT Treatment/Exercise - 08/17/18 0001      Lumbar Exercises: Stretches   Single Knee to Chest Stretch  Right;Left;3 reps;20 seconds    Double Knee to Chest Stretch Limitations  x 15 AAROM with legs on 65 cm P-ball    Hip Flexor Stretch  Right;3 reps;30 seconds    Hip Flexor Stretch Limitations  supine hip flexor + quad stretch 3x30 sec    ITB Stretch  Right;3 reps;30 seconds    Piriformis Stretch  Right;3 reps;30 seconds    Piriformis Stretch Limitations  supine manual stretch      Lumbar Exercises: Aerobic   Nustep  L3 x 5 min UE/LE      Manual Therapy   Joint Mobilization  long axis distraction oscillations grade I-III to bilat. hip    Soft tissue mobilization  STM/IASTM right lateral hip, IT band, quad incl. foam roll use             PT Education - 08/17/18 1500  Education Details  POC, potential symptom etiology    Person(s) Educated  Patient    Methods  Explanation    Comprehension  Verbalized understanding;Returned demonstration       PT Short Term Goals - 07/29/18 2050      PT SHORT TERM GOAL #1   Title  Independent with HEP    Baseline  met for initial HEP    Time  3    Period  Weeks    Status  Achieved    Target Date  07/15/18      PT SHORT TERM GOAL #2   Title  Improve TUG to <14 sec for decreased fall risk    Baseline  16 sec    Time  3    Period  Weeks    Status  On-going    Target Date  08/19/18        PT Long Term Goals - 07/29/18 2051      PT LONG TERM GOAL #1   Title  Improve FOTO score to 33% or less impairment    Baseline  48% limited    Time  6    Period  Weeks    Status  On-going    Target Date  09/09/18      PT LONG TERM GOAL #2   Title  Right knee ext strength 5/5 to improve ability for stair navigation at home and transfers from low chairs     Baseline  5/5    Time  6    Period  Weeks    Status  Achieved      PT LONG TERM GOAL #3   Title  Improve Romberg foam, EC to 10 sec or greater with feet 5 in. apart on Airex for improved balance to decrease fall risk    Time  6    Period  Weeks    Status  Revised    Target Date  09/09/18      PT LONG TERM GOAL #4   Title  Tolerate standing/ambulation for periods at least 30-40 min to go grocery shopping with thigh pain 2/10 or less    Baseline  4/10, symptoms variable    Time  6    Period  Weeks    Status  On-going    Target Date  09/09/18            Plan - 08/17/18 1502    Clinical Impression Statement  Still unclear etiology of symptoms with potential differential diagnosis including lumbar radiculopathy, cervical myelopathy pending MRI, vs. local muscle or joint pain. Feels relief with tx. but fair progress with functional status with intermittent symptom exacerbations.    Personal Factors and Comorbidities  Age;Comorbidity 3+;Other    Comorbidities  anxiety, HTN, HLD, surgical history for neck    Examination-Activity Limitations  Transfers;Squat;Bed Mobility;Lift;Stairs;Locomotion Level;Stand    Examination-Participation Restrictions  Cleaning;Community Activity    Stability/Clinical Decision Making  Evolving/Moderate complexity    Clinical Decision Making  Moderate    Rehab Potential  Good    PT Frequency  2x / week    PT Duration  6 weeks    PT Treatment/Interventions  ADLs/Self Care Home Management;Cryotherapy;Moist Heat;Balance training;Gait training;Stair training;Therapeutic exercise;Therapeutic activities;Functional mobility training;Neuromuscular re-education;Patient/family education;Manual techniques;Dry needling    PT Next Visit Plan  No estim due to heart monitor, continue/progress flexion bias trunk ROM and core + LE functional strengthening, work on balance with SLS, Romberg, tandem stance and reaching outside BOS, stretch hamstrings  PT Home Exercise Plan   Pelvic tilt, SKTC, marches, hip bridge, sit<>stand, Romberg feet apart at counter on even surface    Consulted and Agree with Plan of Care  Patient       Patient will benefit from skilled therapeutic intervention in order to improve the following deficits and impairments:  Abnormal gait, Decreased balance, Decreased mobility, Pain, Impaired flexibility, Decreased strength, Decreased activity tolerance, Decreased range of motion, Difficulty walking  Visit Diagnosis: 1. Low back pain, unspecified back pain laterality, unspecified chronicity, unspecified whether sciatica present   2. Difficulty in walking, not elsewhere classified        Problem List Patient Active Problem List   Diagnosis Date Noted  . Hyperparathyroidism ([place]) 07/17/2018  . History of lacunar cerebrovascular accident (CVA) 06/18/2018  . Osteoporosis 03/13/2018  . Patellofemoral arthritis 03/05/2018  . DDD (degenerative disc disease), lumbar 03/05/2018  . Unstable gait 01/20/2018  . Aortic atherosclerosis ([place]) 06/12/2017  . Palpitations 05/12/2017  . Vitamin B12 deficiency 05/12/2017  . Cervical radiculopathy 04/03/2016  . Caregiver burden 04/03/2016  . Essential tremor 04/18/2015  . Adenomatous colon polyp 06/14/2014  . Diarrhea 04/14/2014  . Heavy alcohol use 04/14/2014  . H/O cold sores 10/15/2013  . CKD (chronic kidney disease), stage III ([place]) 10/15/2013  . Hypercalcemia 01/28/2007  . TOBACCO USE 12/24/2006  . Hyperlipidemia 05/26/2006  . Anxiety state 05/26/2006  . Essential hypertension 05/26/2006  . GERD 05/26/2006    [name], PT, DPT 08/17/18 3:04 PM  [place] [facility] [address] [place], [place], [address] Phone: [phone]   Fax:  [phone]  Name: Crystal Carroll MRN: [id number] Date of Birth: May 25, 1945

## 2018-08-19 ENCOUNTER — Ambulatory Visit: Payer: PPO | Admitting: Physical Therapy

## 2018-08-19 ENCOUNTER — Encounter: Payer: Self-pay | Admitting: Physical Therapy

## 2018-08-19 ENCOUNTER — Other Ambulatory Visit: Payer: Self-pay

## 2018-08-19 DIAGNOSIS — M545 Low back pain, unspecified: Secondary | ICD-10-CM

## 2018-08-19 DIAGNOSIS — R262 Difficulty in walking, not elsewhere classified: Secondary | ICD-10-CM

## 2018-08-19 NOTE — Therapy (Signed)
[place], [place], [address] Phone: [phone]   Fax:  [phone]  Physical Therapy Treatment  Patient Details  Name: Crystal Carroll MRN: [id number] Date of Birth: [birth year] Referring Provider (PT): Hulan Saas, DO   Encounter Date: 08/19/2018  PT End of Session - 08/19/18 1429    Visit Number  11    Number of Visits  20    Date for PT Re-Evaluation  09/09/18    Authorization Type  [employer], Next progress note by visit 67 and add KX at 44    PT Start Time  1423   pt. arrived late   PT Stop Time  1458    PT Time Calculation (min)  35 min    Activity Tolerance  Patient tolerated treatment well    Behavior During Therapy  [facility] for tasks assessed/performed       Past Medical History:  Diagnosis Date  . Anxiety   . GERD (gastroesophageal reflux disease)   . Hyperlipidemia   . Hypertension     Past Surgical History:  Procedure Laterality Date  . CERVICAL LAMINECTOMY  2004   Earle Gell    There were no vitals filed for this visit.  Subjective Assessment - 08/19/18 1428    Subjective  Still having right thigh pain but reports not as bad since last visit/feels like treatment helped.    Currently in Pain?  Yes    Pain Score  4     Pain Location  Leg    Pain Orientation  Right;Upper;Lateral    Pain Descriptors / Indicators  Aching;Sharp    Pain Type  Chronic pain    Pain Onset  More than a month ago    Pain Frequency  Intermittent    Aggravating Factors   standing, walking, stairs    Pain Relieving Factors  rest    Effect of Pain on Daily Activities  Limits mobility tolerance for standing and walking, difficulty wih stair navigaion                       [facility] Adult PT Treatment/Exercise - 08/19/18 0001      Lumbar Exercises: Stretches   Passive Hamstring Stretch  Right;Left;3 reps;30 seconds    Single Knee to Chest Stretch  Right;Left;3 reps;20  seconds    Hip Flexor Stretch  Right;3 reps;30 seconds    Hip Flexor Stretch Limitations  supine hip flexor + quad stretch 3x30 sec    ITB Stretch  Right;3 reps;30 seconds    Piriformis Stretch  Right;3 reps;30 seconds    Piriformis Stretch Limitations  supine manual stretch      Lumbar Exercises: Aerobic   Nustep  L4 x 5 min UE/LE      Manual Therapy   Joint Mobilization  long axis distraction oscillations grade I-III to bilat. hip    Soft tissue mobilization  STM/IASTM right lateral hip, IT band, quad incl. foam roll use             PT Education - 08/19/18 1458    Education Details  POC    Person(s) Educated  Patient    Methods  Explanation    Comprehension  Verbalized understanding       PT Short Term Goals - 07/29/18 2050      PT SHORT TERM GOAL #1   Title  Independent with HEP    Baseline  met for initial HEP  Time  3    Period  Weeks    Status  Achieved    Target Date  07/15/18      PT SHORT TERM GOAL #2   Title  Improve TUG to <14 sec for decreased fall risk    Baseline  16 sec    Time  3    Period  Weeks    Status  On-going    Target Date  08/19/18        PT Long Term Goals - 07/29/18 2051      PT LONG TERM GOAL #1   Title  Improve FOTO score to 33% or less impairment    Baseline  48% limited    Time  6    Period  Weeks    Status  On-going    Target Date  09/09/18      PT LONG TERM GOAL #2   Title  Right knee ext strength 5/5 to improve ability for stair navigation at home and transfers from low chairs    Baseline  5/5    Time  6    Period  Weeks    Status  Achieved      PT LONG TERM GOAL #3   Title  Improve Romberg foam, EC to 10 sec or greater with feet 5 in. apart on Airex for improved balance to decrease fall risk    Time  6    Period  Weeks    Status  Revised    Target Date  09/09/18      PT LONG TERM GOAL #4   Title  Tolerate standing/ambulation for periods at least 30-40 min to go grocery shopping with thigh pain 2/10 or  less    Baseline  4/10, symptoms variable    Time  6    Period  Weeks    Status  On-going    Target Date  09/09/18            Plan - 08/19/18 1458    Clinical Impression Statement  Mild improvement with treatment for decreased RLE pain, improved walking tolerance. Fair status as previously with therapy goals/still unclear symptom etiology. Plan continue PT and await MRI results.    Personal Factors and Comorbidities  Age;Comorbidity 3+;Other    Comorbidities  anxiety, HTN, HLD, surgical history for neck    Examination-Activity Limitations  Transfers;Squat;Bed Mobility;Lift;Stairs;Locomotion Level;Stand    Examination-Participation Restrictions  Cleaning;Community Activity    Stability/Clinical Decision Making  Evolving/Moderate complexity    Clinical Decision Making  Moderate    Rehab Potential  Good    PT Frequency  2x / week    PT Duration  6 weeks    PT Treatment/Interventions  ADLs/Self Care Home Management;Cryotherapy;Moist Heat;Balance training;Gait training;Stair training;Therapeutic exercise;Therapeutic activities;Functional mobility training;Neuromuscular re-education;Patient/family education;Manual techniques;Dry needling    PT Next Visit Plan  No estim due to heart monitor, continue/progress flexion bias trunk ROM and core + LE functional strengthening, manual therapy to right hip and thigh, stretches, balance work as needed    PT Home Exercise Plan  Pelvic tilt, SKTC, marches, hip bridge, sit<>stand, Romberg feet apart at counter on even surface    Consulted and Agree with Plan of Care  Patient       Patient will benefit from skilled therapeutic intervention in order to improve the following deficits and impairments:  Abnormal gait, Decreased balance, Decreased mobility, Pain, Impaired flexibility, Decreased strength, Decreased activity tolerance, Decreased range of motion, Difficulty walking  Visit Diagnosis: 1.  Low back pain, unspecified back pain laterality,  unspecified chronicity, unspecified whether sciatica present   2. Difficulty in walking, not elsewhere classified        Problem List Patient Active Problem List   Diagnosis Date Noted  . Hyperparathyroidism ([place]) 07/17/2018  . History of lacunar cerebrovascular accident (CVA) 06/18/2018  . Osteoporosis 03/13/2018  . Patellofemoral arthritis 03/05/2018  . DDD (degenerative disc disease), lumbar 03/05/2018  . Unstable gait 01/20/2018  . Aortic atherosclerosis ([place]) 06/12/2017  . Palpitations 05/12/2017  . Vitamin B12 deficiency 05/12/2017  . Cervical radiculopathy 04/03/2016  . Caregiver burden 04/03/2016  . Essential tremor 04/18/2015  . Adenomatous colon polyp 06/14/2014  . Diarrhea 04/14/2014  . Heavy alcohol use 04/14/2014  . H/O cold sores 10/15/2013  . CKD (chronic kidney disease), stage III ([place]) 10/15/2013  . Hypercalcemia 01/28/2007  . TOBACCO USE 12/24/2006  . Hyperlipidemia 05/26/2006  . Anxiety state 05/26/2006  . Essential hypertension 05/26/2006  . GERD 05/26/2006   [name], PT, DPT 08/19/18 3:01 PM  [facility] [address] [place], [place], [address] Phone: [phone]   Fax:  [phone]  Name: BERNEDA PICCININNI MRN: [id number] Date of Birth: January 02, 1946

## 2018-08-24 ENCOUNTER — Encounter: Payer: Self-pay | Admitting: Physical Therapy

## 2018-08-24 ENCOUNTER — Ambulatory Visit: Payer: PPO | Admitting: Physical Therapy

## 2018-08-24 ENCOUNTER — Other Ambulatory Visit: Payer: Self-pay

## 2018-08-24 DIAGNOSIS — M545 Low back pain, unspecified: Secondary | ICD-10-CM

## 2018-08-24 DIAGNOSIS — R262 Difficulty in walking, not elsewhere classified: Secondary | ICD-10-CM

## 2018-08-24 NOTE — Therapy (Signed)
[place], [place], [address] Phone: [phone]   Fax:  [phone]  Physical Therapy Treatment  Patient Details  Name: Crystal Carroll MRN: [id number] Date of Birth: 07-25-1945 Referring Provider (PT): Hulan Saas, DO   Encounter Date: 08/24/2018  PT End of Session - 08/24/18 1331    Visit Number  12    Number of Visits  20    Date for PT Re-Evaluation  09/09/18    Authorization Type  [employer], Next progress note by visit 66 and add KX at 77    PT Start Time  0128   13 minutes until completely checked in   PT Stop Time  0201    PT Time Calculation (min)  33 min       Past Medical History:  Diagnosis Date  . Anxiety   . GERD (gastroesophageal reflux disease)   . Hyperlipidemia   . Hypertension     Past Surgical History:  Procedure Laterality Date  . CERVICAL LAMINECTOMY  2004   Earle Gell    There were no vitals filed for this visit.  Subjective Assessment - 08/24/18 1330    Subjective  Pain has been better since last visit. I am walking better. Still have trouble with balance.    Pain Score  2     Pain Location  Leg    Pain Orientation  Right;Upper;Lateral    Pain Descriptors / Indicators  Aching                       [facility] Adult PT Treatment/Exercise - 08/24/18 0001      Neuro Re-ed    Neuro Re-ed Details   Tandem stance 12-28 sec with more UE assist needed with RLE back, narrow stance with head turns then on foam with eyes closed needs 1 finger touch for 10 -15 sec       Lumbar Exercises: Stretches   Single Knee to Chest Stretch  Right;Left;1 rep;30 seconds    Hip Flexor Stretch  Right;3 reps;30 seconds    Hip Flexor Stretch Limitations  supine hip flexor + quad stretch 3x30 sec      Lumbar Exercises: Aerobic   Nustep  L4 x 5 min UE/LE      Knee/Hip Exercises: Standing   Heel Raises  10 reps      Knee/Hip Exercises: Seated   Sit to  Sand  10 reps;2 sets   without UE support, foam in chair for height     Knee/Hip Exercises: Supine   Bridges  10 reps               PT Short Term Goals - 07/29/18 2050      PT SHORT TERM GOAL #1   Title  Independent with HEP    Baseline  met for initial HEP    Time  3    Period  Weeks    Status  Achieved    Target Date  07/15/18      PT SHORT TERM GOAL #2   Title  Improve TUG to <14 sec for decreased fall risk    Baseline  16 sec    Time  3    Period  Weeks    Status  On-going    Target Date  08/19/18        PT Long Term Goals - 07/29/18 2051      PT LONG TERM GOAL #1  Title  Improve FOTO score to 33% or less impairment    Baseline  48% limited    Time  6    Period  Weeks    Status  On-going    Target Date  09/09/18      PT LONG TERM GOAL #2   Title  Right knee ext strength 5/5 to improve ability for stair navigation at home and transfers from low chairs    Baseline  5/5    Time  6    Period  Weeks    Status  Achieved      PT LONG TERM GOAL #3   Title  Improve Romberg foam, EC to 10 sec or greater with feet 5 in. apart on Airex for improved balance to decrease fall risk    Time  6    Period  Weeks    Status  Revised    Target Date  09/09/18      PT LONG TERM GOAL #4   Title  Tolerate standing/ambulation for periods at least 30-40 min to go grocery shopping with thigh pain 2/10 or less    Baseline  4/10, symptoms variable    Time  6    Period  Weeks    Status  On-going    Target Date  09/09/18            Plan - 08/24/18 1401    Clinical Impression Statement  Pt reports improvement in pain. Able to focus balance for treatment time today. Shorter session due to patient late arrival and delayed check in.    PT Next Visit Plan  No estim due to heart monitor, continue/progress flexion bias trunk ROM and core + LE functional strengthening, manual therapy to right hip and thigh, stretches, balance work as needed    PT Home Exercise Plan  Pelvic  tilt, SKTC, marches, hip bridge, sit<>stand, Romberg feet apart at counter on even surface       Patient will benefit from skilled therapeutic intervention in order to improve the following deficits and impairments:  Abnormal gait, Decreased balance, Decreased mobility, Pain, Impaired flexibility, Decreased strength, Decreased activity tolerance, Decreased range of motion, Difficulty walking  Visit Diagnosis: 1. Low back pain, unspecified back pain laterality, unspecified chronicity, unspecified whether sciatica present   2. Difficulty in walking, not elsewhere classified        Problem List Patient Active Problem List   Diagnosis Date Noted  . Hyperparathyroidism ([place]) 07/17/2018  . History of lacunar cerebrovascular accident (CVA) 06/18/2018  . Osteoporosis 03/13/2018  . Patellofemoral arthritis 03/05/2018  . DDD (degenerative disc disease), lumbar 03/05/2018  . Unstable gait 01/20/2018  . Aortic atherosclerosis ([place]) 06/12/2017  . Palpitations 05/12/2017  . Vitamin B12 deficiency 05/12/2017  . Cervical radiculopathy 04/03/2016  . Caregiver burden 04/03/2016  . Essential tremor 04/18/2015  . Adenomatous colon polyp 06/14/2014  . Diarrhea 04/14/2014  . Heavy alcohol use 04/14/2014  . H/O cold sores 10/15/2013  . CKD (chronic kidney disease), stage III ([place]) 10/15/2013  . Hypercalcemia 01/28/2007  . TOBACCO USE 12/24/2006  . Hyperlipidemia 05/26/2006  . Anxiety state 05/26/2006  . Essential hypertension 05/26/2006  . GERD 05/26/2006    Dorene Ar, PTA 08/24/2018, 2:04 PM  [facility] [address] [place], [place], [address] Phone: [phone]   Fax:  [phone]  Name: Crystal Carroll MRN: [id number] Date of Birth: [birth year]

## 2018-08-25 ENCOUNTER — Ambulatory Visit (INDEPENDENT_AMBULATORY_CARE_PROVIDER_SITE_OTHER)
Admission: RE | Admit: 2018-08-25 | Discharge: 2018-08-25 | Disposition: A | Discharge status: Home / Self Care | Payer: PPO | Source: Ambulatory Visit | Attending: Acute Care | Admitting: Acute Care

## 2018-08-25 DIAGNOSIS — Z122 Encounter for screening for malignant neoplasm of respiratory organs: Secondary | ICD-10-CM

## 2018-08-25 DIAGNOSIS — Z87891 Personal history of nicotine dependence: Secondary | ICD-10-CM | POA: Diagnosis not present

## 2018-08-26 ENCOUNTER — Other Ambulatory Visit: Payer: Self-pay

## 2018-08-26 ENCOUNTER — Encounter: Payer: Self-pay | Admitting: Physical Therapy

## 2018-08-26 ENCOUNTER — Ambulatory Visit: Payer: PPO | Admitting: Physical Therapy

## 2018-08-26 DIAGNOSIS — R262 Difficulty in walking, not elsewhere classified: Secondary | ICD-10-CM

## 2018-08-26 DIAGNOSIS — M545 Low back pain, unspecified: Secondary | ICD-10-CM

## 2018-08-26 NOTE — Therapy (Signed)
[place], [place], [address] Phone: [phone]   Fax:  [phone]  Physical Therapy Treatment  Patient Details  Name: Crystal Carroll MRN: [id number] Date of Birth: Apr 18, 1945 Referring Provider (PT): Hulan Saas, DO   Encounter Date: 08/26/2018  PT End of Session - 08/26/18 1503    Visit Number  13    Number of Visits  20    Date for PT Re-Evaluation  09/09/18    Authorization Type  [employer], Next progress note by visit 27 and add KX at 53    PT Start Time  1415    PT Stop Time  1458    PT Time Calculation (min)  43 min    Activity Tolerance  Patient tolerated treatment well    Behavior During Therapy  [facility] for tasks assessed/performed       Past Medical History:  Diagnosis Date  . Anxiety   . GERD (gastroesophageal reflux disease)   . Hyperlipidemia   . Hypertension     Past Surgical History:  Procedure Laterality Date  . CERVICAL LAMINECTOMY  2004   Earle Gell    There were no vitals filed for this visit.  Subjective Assessment - 08/26/18 1418    Subjective  Pt. reports had CT scan for lungs yesterday-she reports has these every year as screening due to history of smoking but does not yet know results. Mild pain in right thigh region but still feeling improved from last week.    Pertinent History  cervical fusion, gait/balance difficulties, anxiety    Limitations  Standing;Walking;Lifting;House hold activities    Diagnostic tests  X-rays    Patient Stated Goals  Improve strength    Currently in Pain?  Yes    Pain Score  2     Pain Location  Leg    Pain Orientation  Right;Upper;Lateral    Pain Descriptors / Indicators  Aching    Pain Type  Chronic pain    Pain Radiating Towards  right lateral hip and thigh    Pain Onset  More than a month ago    Pain Frequency  Intermittent    Aggravating Factors   standing, walking, stairs    Pain Relieving Factors  rest     Effect of Pain on Daily Activities  Limits standing and walking tolerance, difficulty with stair navigation                       [facility] Adult PT Treatment/Exercise - 08/26/18 0001      Lumbar Exercises: Stretches   Single Knee to Chest Stretch  Right;3 reps;20 seconds    Hip Flexor Stretch  Right;3 reps;30 seconds    Hip Flexor Stretch Limitations  supine manual hip flexor/quad stretch    ITB Stretch  Right;3 reps;30 seconds    Piriformis Stretch  Right;3 reps;30 seconds    Piriformis Stretch Limitations  supine manual stretch      Lumbar Exercises: Aerobic   Nustep  L4 x 5 min UE/LE      Knee/Hip Exercises: Standing   Heel Raises  Both;2 sets;10 reps    Rocker Board  1 minute    Rocker Board Limitations  dynamic lateral balance    Other Standing Knee Exercises  Romberg EO 3x10 sec on Airex feet together, EC on Airex feet 5-6" apart 3x10 sec    Other Standing Knee Exercises  toe taps to 4" step x 15  Manual Therapy   Joint Mobilization  long axis distraction oscillations grade I-III to bilat. hip    Soft tissue mobilization  STM/IASTM right lateral hip, IT band, quad incl. foam roll use             PT Education - 08/26/18 1502    Education Details  exercises, balance    Person(s) Educated  Patient    Methods  Explanation;Demonstration;Verbal cues    Comprehension  Verbalized understanding;Returned demonstration       PT Short Term Goals - 07/29/18 2050      PT SHORT TERM GOAL #1   Title  Independent with HEP    Baseline  met for initial HEP    Time  3    Period  Weeks    Status  Achieved    Target Date  07/15/18      PT SHORT TERM GOAL #2   Title  Improve TUG to <14 sec for decreased fall risk    Baseline  16 sec    Time  3    Period  Weeks    Status  On-going    Target Date  08/19/18        PT Long Term Goals - 07/29/18 2051      PT LONG TERM GOAL #1   Title  Improve FOTO score to 33% or less impairment    Baseline  48%  limited    Time  6    Period  Weeks    Status  On-going    Target Date  09/09/18      PT LONG TERM GOAL #2   Title  Right knee ext strength 5/5 to improve ability for stair navigation at home and transfers from low chairs    Baseline  5/5    Time  6    Period  Weeks    Status  Achieved      PT LONG TERM GOAL #3   Title  Improve Romberg foam, EC to 10 sec or greater with feet 5 in. apart on Airex for improved balance to decrease fall risk    Time  6    Period  Weeks    Status  Revised    Target Date  09/09/18      PT LONG TERM GOAL #4   Title  Tolerate standing/ambulation for periods at least 30-40 min to go grocery shopping with thigh pain 2/10 or less    Baseline  4/10, symptoms variable    Time  6    Period  Weeks    Status  On-going    Target Date  09/09/18            Plan - 08/26/18 1504    Clinical Impression Statement  Responding well to manual tx. and stretches for decreased thigh pain with subsequent functoinal gains for walking tolerance. Fair progress with balance awaiting MRI to rule out myelopathy.    Personal Factors and Comorbidities  Age;Comorbidity 3+;Other    Comorbidities  anxiety, HTN, HLD, surgical history for neck    Examination-Activity Limitations  Transfers;Squat;Bed Mobility;Lift;Stairs;Locomotion Level;Stand    Stability/Clinical Decision Making  Evolving/Moderate complexity    Clinical Decision Making  Moderate    Rehab Potential  Good    PT Frequency  2x / week    PT Duration  6 weeks    PT Treatment/Interventions  ADLs/Self Care Home Management;Cryotherapy;Moist Heat;Balance training;Gait training;Stair training;Therapeutic exercise;Therapeutic activities;Functional mobility training;Neuromuscular re-education;Patient/family education;Manual techniques;Dry needling    PT  Next Visit Plan  No estim due to heart monitor, continue manual therapy as needed for hip/thigh, continue balance training, flexion bias ROM and stretches    PT Home  Exercise Plan  Pelvic tilt, SKTC, marches, hip bridge, sit<>stand, Romberg feet apart at counter on even surface    Consulted and Agree with Plan of Care  Patient       Patient will benefit from skilled therapeutic intervention in order to improve the following deficits and impairments:  Abnormal gait, Decreased balance, Decreased mobility, Pain, Impaired flexibility, Decreased strength, Decreased activity tolerance, Decreased range of motion, Difficulty walking  Visit Diagnosis: 1. Low back pain, unspecified back pain laterality, unspecified chronicity, unspecified whether sciatica present   2. Difficulty in walking, not elsewhere classified        Problem List Patient Active Problem List   Diagnosis Date Noted  . Hyperparathyroidism ([place]) 07/17/2018  . History of lacunar cerebrovascular accident (CVA) 06/18/2018  . Osteoporosis 03/13/2018  . Patellofemoral arthritis 03/05/2018  . DDD (degenerative disc disease), lumbar 03/05/2018  . Unstable gait 01/20/2018  . Aortic atherosclerosis ([place]) 06/12/2017  . Palpitations 05/12/2017  . Vitamin B12 deficiency 05/12/2017  . Cervical radiculopathy 04/03/2016  . Caregiver burden 04/03/2016  . Essential tremor 04/18/2015  . Adenomatous colon polyp 06/14/2014  . Diarrhea 04/14/2014  . Heavy alcohol use 04/14/2014  . H/O cold sores 10/15/2013  . CKD (chronic kidney disease), stage III ([place]) 10/15/2013  . Hypercalcemia 01/28/2007  . TOBACCO USE 12/24/2006  . Hyperlipidemia 05/26/2006  . Anxiety state 05/26/2006  . Essential hypertension 05/26/2006  . GERD 05/26/2006    [name], PT, DPT 08/26/18 3:07 PM  [facility] [address] [place], [place], [address] Phone: [phone]   Fax:  [phone]  Name: Crystal Carroll MRN: [id number] Date of Birth: [birth year]

## 2018-08-28 ENCOUNTER — Other Ambulatory Visit: Payer: Self-pay | Admitting: *Deleted

## 2018-08-28 DIAGNOSIS — Z87891 Personal history of nicotine dependence: Secondary | ICD-10-CM

## 2018-08-28 DIAGNOSIS — Z122 Encounter for screening for malignant neoplasm of respiratory organs: Secondary | ICD-10-CM

## 2018-08-31 ENCOUNTER — Encounter: Payer: Self-pay | Admitting: Physical Therapy

## 2018-08-31 ENCOUNTER — Other Ambulatory Visit: Payer: Self-pay

## 2018-08-31 ENCOUNTER — Ambulatory Visit: Payer: PPO | Admitting: Physical Therapy

## 2018-08-31 DIAGNOSIS — M545 Low back pain, unspecified: Secondary | ICD-10-CM

## 2018-08-31 DIAGNOSIS — R262 Difficulty in walking, not elsewhere classified: Secondary | ICD-10-CM

## 2018-08-31 NOTE — Therapy (Signed)
[place], [place], [address] Phone: [phone]   Fax:  [phone]  Physical Therapy Treatment  Patient Details  Name: Crystal Carroll MRN: [id number] Date of Birth: 1945/07/08 Referring Provider (PT): Hulan Saas, DO   Encounter Date: 08/31/2018  PT End of Session - 08/31/18 1327    Visit Number  14    Number of Visits  20    Date for PT Re-Evaluation  09/09/18    Authorization Type  [employer], Next progress note by visit 67 and add KX at 76    PT Start Time  1325    PT Stop Time  1406    PT Time Calculation (min)  41 min    Activity Tolerance  Patient tolerated treatment well    Behavior During Therapy  [facility] for tasks assessed/performed       Past Medical History:  Diagnosis Date  . Anxiety   . GERD (gastroesophageal reflux disease)   . Hyperlipidemia   . Hypertension     Past Surgical History:  Procedure Laterality Date  . CERVICAL LAMINECTOMY  2004   Earle Gell    There were no vitals filed for this visit.  Subjective Assessment - 08/31/18 1323    Subjective  Pt. will be getting cervical MRI later this week to rule out myelopathy as potential contributing cause to balance issues. She reports continued improvement with her leg pain from previous status but still c/o balance issues. She forgo her cane today.    Currently in Pain?  No/denies         [facility] PT Assessment - 08/31/18 0001      Observation/Other Assessments   Focus on Therapeutic Outcomes (FOTO)   43% limited                   [facility] Adult PT Treatment/Exercise - 08/31/18 0001      Lumbar Exercises: Stretches   Single Knee to Chest Stretch  Right;5 reps;20 seconds    Hip Flexor Stretch  Right;3 reps;30 seconds    Hip Flexor Stretch Limitations  supine manual hip flexor/quad stretch    ITB Stretch  Right;3 reps;30 seconds    Piriformis Stretch  Right;3 reps;30 seconds    Piriformis  Stretch Limitations  supine manual stretch      Lumbar Exercises: Aerobic   Nustep  L4 x 5 min UE/LE      Knee/Hip Exercises: Standing   Heel Raises  Both;2 sets;10 reps    Heel Raises Limitations  on Airex for balance with intermittent UE support     Rocker Board  2 minutes    Rocker Board Limitations  dynamic lateral and fw/rev balance with intermittent CGA for safety x 1 min ea.    Other Standing Knee Exercises  Romberg EO 3x10 sec on Airex feet together, EC on Airex feet 5-6" apart 3x10 sec    Other Standing Knee Exercises  toe taps to 4" step x 15      Manual Therapy   Soft tissue mobilization  STM/IASTM right lateral hip, IT band, quad incl. foam roll use             PT Education - 08/31/18 1407    Education Details  POC, potential symptom etiology for balance    Person(s) Educated  Patient    Methods  Explanation    Comprehension  Verbalized understanding       PT Short Term Goals - 07/29/18  2050      PT SHORT TERM GOAL #1   Title  Independent with HEP    Baseline  met for initial HEP    Time  3    Period  Weeks    Status  Achieved    Target Date  07/15/18      PT SHORT TERM GOAL #2   Title  Improve TUG to <14 sec for decreased fall risk    Baseline  16 sec    Time  3    Period  Weeks    Status  On-going    Target Date  08/19/18        PT Long Term Goals - 08/31/18 1413      PT LONG TERM GOAL #1   Title  Improve FOTO score to 33% or less impairment    Baseline  43% limited    Time  6    Period  Weeks    Status  On-going      PT LONG TERM GOAL #2   Title  Right knee ext strength 5/5 to improve ability for stair navigation at home and transfers from low chairs    Baseline  5/5    Time  6    Period  Weeks    Status  Achieved      PT LONG TERM GOAL #3   Title  Improve Romberg foam, EC to 10 sec or greater with feet 5 in. apart on Airex for improved balance to decrease fall risk    Baseline  not assessed today-at last attempt pt. required 2  attempts but able >10 sec second attempt with feet 5 in. apart, continue goal for 1 attempt feet 5 in. apart on foam with EC    Time  6    Period  Weeks    Status  On-going      PT LONG TERM GOAL #4   Title  Tolerate standing/ambulation for periods at least 30-40 min to go grocery shopping with thigh pain 2/10 or less    Baseline  progressing    Time  6    Period  Weeks    Status  On-going            Plan - 08/31/18 1408    Clinical Impression Statement  Pt. continues to improve with decreased RLE pain symptoms from previous status, moderate improvement per FOTO score gains. Still awating cervical MRI to rule out myelopathy as contributing to balance issues as still unclear etiology of these (balance) symptoms.    Stability/Clinical Decision Making  Evolving/Moderate complexity    Clinical Decision Making  Moderate    Rehab Potential  Good    PT Frequency  2x / week    PT Duration  6 weeks    PT Treatment/Interventions  ADLs/Self Care Home Management;Cryotherapy;Moist Heat;Balance training;Gait training;Stair training;Therapeutic exercise;Therapeutic activities;Functional mobility training;Neuromuscular re-education;Patient/family education;Manual techniques;Dry needling    PT Next Visit Plan  No estim due to heart monitor, continue manual therapy as needed for hip/thigh, continue balance training, flexion bias ROM and stretches    PT Home Exercise Plan  Pelvic tilt, SKTC, marches, hip bridge, sit<>stand, Romberg feet apart at counter on even surface    Consulted and Agree with Plan of Care  Patient       Patient will benefit from skilled therapeutic intervention in order to improve the following deficits and impairments:  Abnormal gait, Decreased balance, Decreased mobility, Pain, Impaired flexibility, Decreased strength, Decreased activity tolerance, Decreased  range of motion, Difficulty walking  Visit Diagnosis: Low back pain, unspecified back pain laterality, unspecified  chronicity, unspecified whether sciatica present  Difficulty in walking, not elsewhere classified     Problem List Patient Active Problem List   Diagnosis Date Noted  . Hyperparathyroidism ([place]) 07/17/2018  . History of lacunar cerebrovascular accident (CVA) 06/18/2018  . Osteoporosis 03/13/2018  . Patellofemoral arthritis 03/05/2018  . DDD (degenerative disc disease), lumbar 03/05/2018  . Unstable gait 01/20/2018  . Aortic atherosclerosis ([place]) 06/12/2017  . Palpitations 05/12/2017  . Vitamin B12 deficiency 05/12/2017  . Cervical radiculopathy 04/03/2016  . Caregiver burden 04/03/2016  . Essential tremor 04/18/2015  . Adenomatous colon polyp 06/14/2014  . Diarrhea 04/14/2014  . Heavy alcohol use 04/14/2014  . H/O cold sores 10/15/2013  . CKD (chronic kidney disease), stage III ([place]) 10/15/2013  . Hypercalcemia 01/28/2007  . TOBACCO USE 12/24/2006  . Hyperlipidemia 05/26/2006  . Anxiety state 05/26/2006  . Essential hypertension 05/26/2006  . GERD 05/26/2006    [name], PT, DPT 08/31/18 2:14 PM  [facility] [address] [place], [place], [address] Phone: [phone]   Fax:  [phone]  Name: Crystal Carroll MRN: [id number] Date of Birth: 10-Nov-1945

## 2018-08-31 NOTE — Therapy (Signed)
[place], [place], [address] Phone: [phone]   Fax:  [phone]  Physical Therapy Treatment  Patient Details  Name: Crystal Carroll MRN: [id number] Date of Birth: 1945/07/08 Referring Provider (PT): Hulan Saas, DO   Encounter Date: 08/31/2018  PT End of Session - 08/31/18 1327    Visit Number  14    Number of Visits  20    Date for PT Re-Evaluation  09/09/18    Authorization Type  [employer], Next progress note by visit 67 and add KX at 76    PT Start Time  1325    PT Stop Time  1406    PT Time Calculation (min)  41 min    Activity Tolerance  Patient tolerated treatment well    Behavior During Therapy  [facility] for tasks assessed/performed       Past Medical History:  Diagnosis Date  . Anxiety   . GERD (gastroesophageal reflux disease)   . Hyperlipidemia   . Hypertension     Past Surgical History:  Procedure Laterality Date  . CERVICAL LAMINECTOMY  2004   Earle Gell    There were no vitals filed for this visit.  Subjective Assessment - 08/31/18 1323    Subjective  Pt. will be getting cervical MRI later this week to rule out myelopathy as potential contributing cause to balance issues. She reports continued improvement with her leg pain from previous status but still c/o balance issues. She forgo her cane today.    Currently in Pain?  No/denies         [facility] PT Assessment - 08/31/18 0001      Observation/Other Assessments   Focus on Therapeutic Outcomes (FOTO)   43% limited                   [facility] Adult PT Treatment/Exercise - 08/31/18 0001      Lumbar Exercises: Stretches   Single Knee to Chest Stretch  Right;5 reps;20 seconds    Hip Flexor Stretch  Right;3 reps;30 seconds    Hip Flexor Stretch Limitations  supine manual hip flexor/quad stretch    ITB Stretch  Right;3 reps;30 seconds    Piriformis Stretch  Right;3 reps;30 seconds    Piriformis  Stretch Limitations  supine manual stretch      Lumbar Exercises: Aerobic   Nustep  L4 x 5 min UE/LE      Knee/Hip Exercises: Standing   Heel Raises  Both;2 sets;10 reps    Heel Raises Limitations  on Airex for balance with intermittent UE support     Rocker Board  2 minutes    Rocker Board Limitations  dynamic lateral and fw/rev balance with intermittent CGA for safety x 1 min ea.    Other Standing Knee Exercises  Romberg EO 3x10 sec on Airex feet together, EC on Airex feet 5-6" apart 3x10 sec    Other Standing Knee Exercises  toe taps to 4" step x 15      Manual Therapy   Soft tissue mobilization  STM/IASTM right lateral hip, IT band, quad incl. foam roll use             PT Education - 08/31/18 1407    Education Details  POC, potential symptom etiology for balance    Person(s) Educated  Patient    Methods  Explanation    Comprehension  Verbalized understanding       PT Short Term Goals - 07/29/18  2050      PT SHORT TERM GOAL #1   Title  Independent with HEP    Baseline  met for initial HEP    Time  3    Period  Weeks    Status  Achieved    Target Date  07/15/18      PT SHORT TERM GOAL #2   Title  Improve TUG to <14 sec for decreased fall risk    Baseline  16 sec    Time  3    Period  Weeks    Status  On-going    Target Date  08/19/18        PT Long Term Goals - 08/31/18 1328      PT LONG TERM GOAL #1   Title  Improve FOTO score to 33% or less impairment            Plan - 08/31/18 1408    Clinical Impression Statement  Pt. continues to improve with decreased RLE pain symptoms from previous status, moderate improvement per FOTO score gains. Still awating cervical MRI to rule out myelopathy as contributing to balance issues as still unclear etiology of these (balance) symptoms.    Stability/Clinical Decision Making  Evolving/Moderate complexity    Clinical Decision Making  Moderate    Rehab Potential  Good    PT Frequency  2x / week    PT Duration   6 weeks    PT Treatment/Interventions  ADLs/Self Care Home Management;Cryotherapy;Moist Heat;Balance training;Gait training;Stair training;Therapeutic exercise;Therapeutic activities;Functional mobility training;Neuromuscular re-education;Patient/family education;Manual techniques;Dry needling    PT Next Visit Plan  No estim due to heart monitor, continue manual therapy as needed for hip/thigh, continue balance training, flexion bias ROM and stretches    PT Home Exercise Plan  Pelvic tilt, SKTC, marches, hip bridge, sit<>stand, Romberg feet apart at counter on even surface    Consulted and Agree with Plan of Care  Patient       Patient will benefit from skilled therapeutic intervention in order to improve the following deficits and impairments:  Abnormal gait, Decreased balance, Decreased mobility, Pain, Impaired flexibility, Decreased strength, Decreased activity tolerance, Decreased range of motion, Difficulty walking  Visit Diagnosis: Low back pain, unspecified back pain laterality, unspecified chronicity, unspecified whether sciatica present  Difficulty in walking, not elsewhere classified     Problem List Patient Active Problem List   Diagnosis Date Noted  . Hyperparathyroidism ([place]) 07/17/2018  . History of lacunar cerebrovascular accident (CVA) 06/18/2018  . Osteoporosis 03/13/2018  . Patellofemoral arthritis 03/05/2018  . DDD (degenerative disc disease), lumbar 03/05/2018  . Unstable gait 01/20/2018  . Aortic atherosclerosis ([place]) 06/12/2017  . Palpitations 05/12/2017  . Vitamin B12 deficiency 05/12/2017  . Cervical radiculopathy 04/03/2016  . Caregiver burden 04/03/2016  . Essential tremor 04/18/2015  . Adenomatous colon polyp 06/14/2014  . Diarrhea 04/14/2014  . Heavy alcohol use 04/14/2014  . H/O cold sores 10/15/2013  . CKD (chronic kidney disease), stage III ([place]) 10/15/2013  . Hypercalcemia 01/28/2007  . TOBACCO USE 12/24/2006  . Hyperlipidemia 05/26/2006  .  Anxiety state 05/26/2006  . Essential hypertension 05/26/2006  . GERD 05/26/2006   [name], PT, DPT 08/31/18 2:11 PM  [facility] [address] [place], [place], [address] Phone: [phone]   Fax:  [phone]  Name: [name] MRN: [id number] Date of Birth: 02-Apr-1945

## 2018-09-02 ENCOUNTER — Other Ambulatory Visit: Payer: Self-pay

## 2018-09-02 ENCOUNTER — Ambulatory Visit: Payer: PPO | Admitting: Physical Therapy

## 2018-09-02 ENCOUNTER — Encounter: Payer: Self-pay | Admitting: Physical Therapy

## 2018-09-02 DIAGNOSIS — M545 Low back pain, unspecified: Secondary | ICD-10-CM

## 2018-09-02 DIAGNOSIS — R262 Difficulty in walking, not elsewhere classified: Secondary | ICD-10-CM

## 2018-09-02 NOTE — Therapy (Signed)
[place], [place], [address] Phone: [phone]   Fax:  [phone]  Physical Therapy Treatment  Patient Details  Name: Crystal Carroll MRN: [id number] Date of Birth: 1945-11-16 Referring Provider (PT): Hulan Saas, DO   Encounter Date: 09/02/2018  PT End of Session - 09/02/18 1424    Visit Number  15    Number of Visits  20    Date for PT Re-Evaluation  09/09/18    Authorization Type  [employer], Next progress note by visit 45 and add KX at 33    PT Start Time  1425   pt. arrived late   PT Stop Time  1504    PT Time Calculation (min)  39 min    Activity Tolerance  Patient tolerated treatment well    Behavior During Therapy  [facility] for tasks assessed/performed       Past Medical History:  Diagnosis Date  . Anxiety   . GERD (gastroesophageal reflux disease)   . Hyperlipidemia   . Hypertension     Past Surgical History:  Procedure Laterality Date  . CERVICAL LAMINECTOMY  2004   Earle Gell    There were no vitals filed for this visit.  Subjective Assessment - 09/02/18 1421    Subjective  Pt. will get cervical MRI on Friday to rule out myelopathy as cause of balance deficits. Thigh pain still improved but c/o balance difficulty though able to ambulate in clinic without AD (previously had more difficulty) with use of cane for community/more prolonged ambulation.    Pertinent History  cervical fusion, gait/balance difficulties, anxiety    Limitations  Standing;Walking;Lifting;House hold activities    Patient Stated Goals  Improve strength    Currently in Pain?  No/denies         [facility] PT Assessment - 09/02/18 0001      Ambulation/Gait   Gait Comments  TUG 13.36 seconds                   [facility] Adult PT Treatment/Exercise - 09/02/18 0001      Lumbar Exercises: Stretches   Single Knee to Chest Stretch  Right;5 reps    Hip Flexor Stretch  Right;3 reps;30  seconds    Hip Flexor Stretch Limitations  supine manual hip flexor/quad stretch    ITB Stretch  Right;3 reps;30 seconds    Piriformis Stretch  Right;3 reps;30 seconds    Piriformis Stretch Limitations  supine manual stretch      Lumbar Exercises: Aerobic   Nustep  L5 x 5 min UE/LE      Lumbar Exercises: Supine   Pelvic Tilt  15 reps    Bent Knee Raise  15 reps      Knee/Hip Exercises: Standing   Heel Raises  Both;2 sets;10 reps    Heel Raises Limitations  on Airex for balance with intermittent UE support     Rocker Board  1 minute    Rocker Board Limitations  dynamic lateral balance    Other Standing Knee Exercises  Romberg EO 3x10 sec on Airex feet together, EC on Airex feet 5-6" apart 3x10 sec      Manual Therapy   Soft tissue mobilization  STM/IASTM right lateral hip, IT band, quad incl. foam roll use             PT Education - 09/02/18 1423    Education Details  POC    Person(s) Educated  Patient  Methods  Explanation    Comprehension  Verbalized understanding       PT Short Term Goals - 09/02/18 1432      PT SHORT TERM GOAL #1   Title  Independent with HEP    Baseline  met for initial HEP    Time  3    Period  Weeks    Status  Achieved      PT SHORT TERM GOAL #2   Title  Improve TUG to <14 sec for decreased fall risk    Baseline  13.36 seconds    Time  3    Period  Weeks    Status  On-going        PT Long Term Goals - 09/02/18 1432      PT LONG TERM GOAL #1   Title  Improve FOTO score to 33% or less impairment    Baseline  43% limited    Time  6    Period  Weeks    Status  On-going      PT LONG TERM GOAL #2   Title  Right knee ext strength 5/5 to improve ability for stair navigation at home and transfers from low chairs    Baseline  5/5    Time  6    Period  Weeks    Status  Achieved      PT LONG TERM GOAL #3   Title  Improve Romberg foam, EC to 10 sec or greater with feet 5 in. apart on Airex for improved balance to decrease fall  risk    Baseline  not assessed today-at last attempt pt. required 2 attempts but able >10 sec second attempt with feet 5 in. apart, continue goal for 1 attempt feet 5 in. apart on foam with EC    Time  6    Period  Weeks    Status  On-going      PT LONG TERM GOAL #4   Title  Tolerate standing/ambulation for periods at least 30-40 min to go grocery shopping with thigh pain 2/10 or less    Baseline  progressing    Time  6    Period  Weeks    Status  On-going            Plan - 09/02/18 1434    Clinical Impression Statement  As previously continued improve with RLE radiating/thigh pain symptoms with improved walking tolerance. Fair progress with balance but improving with decreased fall risk as evidenced by TUG score improvement. Plan await cervical MRI results to rule out myelopathy then determine d/c vs. continue therapy.    Personal Factors and Comorbidities  Age;Comorbidity 3+;Other    Comorbidities  anxiety, HTN, HLD, surgical history for neck    Examination-Activity Limitations  Transfers;Squat;Bed Mobility;Lift;Stairs;Locomotion Level;Stand    Examination-Participation Restrictions  Cleaning;Community Activity    Stability/Clinical Decision Making  Evolving/Moderate complexity    Clinical Decision Making  Moderate    Rehab Potential  Fair    PT Frequency  2x / week    PT Treatment/Interventions  ADLs/Self Care Home Management;Cryotherapy;Moist Heat;Balance training;Gait training;Stair training;Therapeutic exercise;Therapeutic activities;Functional mobility training;Neuromuscular re-education;Patient/family education;Manual techniques;Dry needling    PT Next Visit Plan  No estim due to heart monitor, continue manual therapy as needed for hip/thigh, continue balance training, flexion bias ROM and stretches    PT Home Exercise Plan  Pelvic tilt, SKTC, marches, hip bridge, sit<>stand, Romberg feet apart at counter on even surface    Consulted and Agree  with Plan of Care  Patient        Patient will benefit from skilled therapeutic intervention in order to improve the following deficits and impairments:  Abnormal gait, Decreased balance, Decreased mobility, Pain, Impaired flexibility, Decreased strength, Decreased activity tolerance, Decreased range of motion, Difficulty walking  Visit Diagnosis: Low back pain, unspecified back pain laterality, unspecified chronicity, unspecified whether sciatica present  Difficulty in walking, not elsewhere classified     Problem List Patient Active Problem List   Diagnosis Date Noted  . Hyperparathyroidism ([place]) 07/17/2018  . History of lacunar cerebrovascular accident (CVA) 06/18/2018  . Osteoporosis 03/13/2018  . Patellofemoral arthritis 03/05/2018  . DDD (degenerative disc disease), lumbar 03/05/2018  . Unstable gait 01/20/2018  . Aortic atherosclerosis ([place]) 06/12/2017  . Palpitations 05/12/2017  . Vitamin B12 deficiency 05/12/2017  . Cervical radiculopathy 04/03/2016  . Caregiver burden 04/03/2016  . Essential tremor 04/18/2015  . Adenomatous colon polyp 06/14/2014  . Diarrhea 04/14/2014  . Heavy alcohol use 04/14/2014  . H/O cold sores 10/15/2013  . CKD (chronic kidney disease), stage III ([place]) 10/15/2013  . Hypercalcemia 01/28/2007  . TOBACCO USE 12/24/2006  . Hyperlipidemia 05/26/2006  . Anxiety state 05/26/2006  . Essential hypertension 05/26/2006  . GERD 05/26/2006    [name], PT, DPT 09/02/18 3:07 PM  [facility] [address] [place], [place], [address] Phone: [phone]   Fax:  [phone]  Name: FAREEDAH MAHLER MRN: [id number] Date of Birth: 14-Oct-1945

## 2018-09-04 ENCOUNTER — Other Ambulatory Visit: Payer: PPO

## 2018-09-04 ENCOUNTER — Other Ambulatory Visit: Payer: Self-pay

## 2018-09-04 ENCOUNTER — Ambulatory Visit
Admission: RE | Admit: 2018-09-04 | Discharge: 2018-09-04 | Disposition: A | Discharge status: Home / Self Care | Payer: PPO | Source: Ambulatory Visit | Attending: Neurology | Admitting: Neurology

## 2018-09-04 DIAGNOSIS — R292 Abnormal reflex: Secondary | ICD-10-CM

## 2018-09-04 DIAGNOSIS — M4802 Spinal stenosis, cervical region: Secondary | ICD-10-CM | POA: Diagnosis not present

## 2018-09-04 DIAGNOSIS — Z9889 Other specified postprocedural states: Secondary | ICD-10-CM

## 2018-09-10 ENCOUNTER — Telehealth: Payer: Self-pay

## 2018-09-10 ENCOUNTER — Other Ambulatory Visit: Payer: Self-pay

## 2018-09-10 DIAGNOSIS — M79604 Pain in right leg: Secondary | ICD-10-CM

## 2018-09-10 NOTE — Telephone Encounter (Signed)
-----   Message from Pieter Partridge, DO sent at 09/08/2018  3:43 PM EDT ----- MRI of cervical spine shows a couple of discs pressing the spinal cord in the neck.  It should probably be evaluated by neurosurgery.  However, I am not sure that is the cause of her leg issues.  I would like to first check MRI of lumbar spine without contrast to evaluate bilateral leg pain and weakness.  We can refer to neurosurgery after this is performed.

## 2018-09-10 NOTE — Telephone Encounter (Signed)
Patient notified of results and placed order for mri lumbar spine without contrast.

## 2018-09-25 ENCOUNTER — Other Ambulatory Visit: Payer: PPO

## 2018-09-28 ENCOUNTER — Ambulatory Visit (INDEPENDENT_AMBULATORY_CARE_PROVIDER_SITE_OTHER): Payer: PPO

## 2018-09-28 ENCOUNTER — Encounter: Payer: Self-pay | Admitting: Family Medicine

## 2018-09-28 ENCOUNTER — Other Ambulatory Visit: Payer: Self-pay

## 2018-09-28 VITALS — BP 126/68 | Temp 96.8°F | Ht 65.0 in | Wt 149.8 lb

## 2018-09-28 DIAGNOSIS — Z0000 Encounter for general adult medical examination without abnormal findings: Secondary | ICD-10-CM

## 2018-09-28 DIAGNOSIS — Z23 Encounter for immunization: Secondary | ICD-10-CM

## 2018-09-28 NOTE — Progress Notes (Signed)
I have reviewed and agree with note, evaluation, plan.   Seymore Brodowski, MD  

## 2018-09-28 NOTE — Patient Instructions (Signed)
Crystal Carroll , Thank you for taking time to come for your Medicare Wellness Visit. I appreciate your ongoing commitment to your health goals. Please review the following plan we discussed and let me know if I can assist you in the future.   Screening recommendations/referrals: Colorectal Screening: up to date; last 05/31/14 Mammogram: completed 08/03/18 Bone Density: up to date; last 05/19/17  Vision and Dental Exams: Recommended annual ophthalmology exams for early detection of glaucoma and other disorders of the eye Recommended annual dental exams for proper oral hygiene  Vaccinations: Influenza vaccine: today Pneumococcal vaccine: up to date; last 04/15/14 Tdap vaccine: up to date; last 10/18/16  Shingles vaccine: Please call your insurance company to determine your out of pocket expense for the Shingrix vaccine. You may receive this vaccine at your local pharmacy. (see attached information)   Advanced directives: We have received a copy of your POA (Power of [place]) and/or Living Will. These documents can be located in your chart.  Goals: Recommend to remove any items from the home that may cause slips or trips.  Next appointment: Please schedule your Annual Wellness Visit with your Nurse Health Advisor in one year.  Preventive Care 17 Years and Older, Female Preventive care refers to lifestyle choices and visits with your health care provider that can promote health and wellness. What does preventive care include?  A yearly physical exam. This is also called an annual well check.  Dental exams once or twice a year.  Routine eye exams. Ask your health care provider how often you should have your eyes checked.  Personal lifestyle choices, including:  Daily care of your teeth and gums.  Regular physical activity.  Eating a healthy diet.  Avoiding tobacco and drug use.  Limiting alcohol use.  Practicing safe sex.  Taking low-dose aspirin every day if recommended by your  health care provider.  Taking vitamin and mineral supplements as recommended by your health care provider. What happens during an annual well check? The services and screenings done by your health care provider during your annual well check will depend on your age, overall health, lifestyle risk factors, and family history of disease. Counseling  Your health care provider may ask you questions about your:  Alcohol use.  Tobacco use.  Drug use.  Emotional well-being.  Home and relationship well-being.  Sexual activity.  Eating habits.  History of falls.  Memory and ability to understand (cognition).  Work and work Statistician.  Reproductive health. Screening  You may have the following tests or measurements:  Height, weight, and BMI.  Blood pressure.  Lipid and cholesterol levels. These may be checked every 5 years, or more frequently if you are over 84 years old.  Skin check.  Lung cancer screening. You may have this screening every year starting at age 40 if you have a 30-pack-year history of smoking and currently smoke or have quit within the past 15 years.  Fecal occult blood test (FOBT) of the stool. You may have this test every year starting at age 45.  Flexible sigmoidoscopy or colonoscopy. You may have a sigmoidoscopy every 5 years or a colonoscopy every 10 years starting at age 8.  Hepatitis C blood test.  Hepatitis B blood test.  Sexually transmitted disease (STD) testing.  Diabetes screening. This is done by checking your blood sugar (glucose) after you have not eaten for a while (fasting). You may have this done every 1-3 years.  Bone density scan. This is done to screen for  osteoporosis. You may have this done starting at age 29.  Mammogram. This may be done every 1-2 years. Talk to your health care provider about how often you should have regular mammograms. Talk with your health care provider about your test results, treatment options, and if  necessary, the need for more tests. Vaccines  Your health care provider may recommend certain vaccines, such as:  Influenza vaccine. This is recommended every year.  Tetanus, diphtheria, and acellular pertussis (Tdap, Td) vaccine. You may need a Td booster every 10 years.  Zoster vaccine. You may need this after age 87.  Pneumococcal 13-valent conjugate (PCV13) vaccine. One dose is recommended after age 67.  Pneumococcal polysaccharide (PPSV23) vaccine. One dose is recommended after age 43. Talk to your health care provider about which screenings and vaccines you need and how often you need them. This information is not intended to replace advice given to you by your health care provider. Make sure you discuss any questions you have with your health care provider. Document Released: 01/20/2015 Document Revised: 09/13/2015 Document Reviewed: 10/25/2014 Elsevier Interactive Patient Education  2017 [place] Prevention in the Home Falls can cause injuries. They can happen to people of all ages. There are many things you can do to make your home safe and to help prevent falls. What can I do on the outside of my home?  Regularly fix the edges of walkways and driveways and fix any cracks.  Remove anything that might make you trip as you walk through a door, such as a raised step or threshold.  Trim any bushes or trees on the path to your home.  Use bright outdoor lighting.  Clear any walking paths of anything that might make someone trip, such as rocks or tools.  Regularly check to see if handrails are loose or broken. Make sure that both sides of any steps have handrails.  Any raised decks and porches should have guardrails on the edges.  Have any leaves, snow, or ice cleared regularly.  Use sand or salt on walking paths during winter.  Clean up any spills in your garage right away. This includes oil or grease spills. What can I do in the bathroom?  Use night lights.   Install grab bars by the toilet and in the tub and shower. Do not use towel bars as grab bars.  Use non-skid mats or decals in the tub or shower.  If you need to sit down in the shower, use a plastic, non-slip stool.  Keep the floor dry. Clean up any water that spills on the floor as soon as it happens.  Remove soap buildup in the tub or shower regularly.  Attach bath mats securely with double-sided non-slip rug tape.  Do not have throw rugs and other things on the floor that can make you trip. What can I do in the bedroom?  Use night lights.  Make sure that you have a light by your bed that is easy to reach.  Do not use any sheets or blankets that are too big for your bed. They should not hang down onto the floor.  Have a firm chair that has side arms. You can use this for support while you get dressed.  Do not have throw rugs and other things on the floor that can make you trip. What can I do in the kitchen?  Clean up any spills right away.  Avoid walking on wet floors.  Keep items that you use  a lot in easy-to-reach places.  If you need to reach something above you, use a strong step stool that has a grab bar.  Keep electrical cords out of the way.  Do not use floor polish or wax that makes floors slippery. If you must use wax, use non-skid floor wax.  Do not have throw rugs and other things on the floor that can make you trip. What can I do with my stairs?  Do not leave any items on the stairs.  Make sure that there are handrails on both sides of the stairs and use them. Fix handrails that are broken or loose. Make sure that handrails are as long as the stairways.  Check any carpeting to make sure that it is firmly attached to the stairs. Fix any carpet that is loose or worn.  Avoid having throw rugs at the top or bottom of the stairs. If you do have throw rugs, attach them to the floor with carpet tape.  Make sure that you have a light switch at the top of the  stairs and the bottom of the stairs. If you do not have them, ask someone to add them for you. What else can I do to help prevent falls?  Wear shoes that:  Do not have high heels.  Have rubber bottoms.  Are comfortable and fit you well.  Are closed at the toe. Do not wear sandals.  If you use a stepladder:  Make sure that it is fully opened. Do not climb a closed stepladder.  Make sure that both sides of the stepladder are locked into place.  Ask someone to hold it for you, if possible.  Clearly mark and make sure that you can see:  Any grab bars or handrails.  First and last steps.  Where the edge of each step is.  Use tools that help you move around (mobility aids) if they are needed. These include:  Canes.  Walkers.  Scooters.  Crutches.  Turn on the lights when you go into a dark area. Replace any light bulbs as soon as they burn out.  Set up your furniture so you have a clear path. Avoid moving your furniture around.  If any of your floors are uneven, fix them.  If there are any pets around you, be aware of where they are.  Review your medicines with your doctor. Some medicines can make you feel dizzy. This can increase your chance of falling. Ask your doctor what other things that you can do to help prevent falls. This information is not intended to replace advice given to you by your health care provider. Make sure you discuss any questions you have with your health care provider. Document Released: 10/20/2008 Document Revised: 06/01/2015 Document Reviewed: 01/28/2014 Elsevier Interactive Patient Education  2017 [employer].

## 2018-09-28 NOTE — Progress Notes (Signed)
Subjective:   Crystal Carroll is a 73 y.o. female who presents for Medicare Annual (Subsequent) preventive examination.  Review of Systems:   Cardiac Risk Factors include: advanced age (>27men, >27 women);hypertension;dyslipidemia     Objective:     Vitals: BP 126/68 (BP Location: Left Arm, Patient Position: Sitting, Cuff Size: Normal)   Temp (!) 96.8 F (36 C) (Temporal)   Ht 5\' 5"  (1.651 m)   Wt 149 lb 12.8 oz (67.9 kg)   LMP  (LMP Unknown)   BMI 24.93 kg/m   Body mass index is 24.93 kg/m.  Advanced Directives 09/28/2018 07/28/2018 06/24/2018 07/22/2016  Does Patient Have a Medical Advance Directive? Yes Yes Yes Yes  Type of Paramedic of [place];Living will [place];Living will [place];Living will [place];Living will  Does patient want to make changes to medical advance directive? No - Patient declined No - Patient declined No - Patient declined No - Patient declined  Copy of [place] in Chart? Yes - validated most recent copy scanned in chart (See row information) No - copy requested No - copy requested No - copy requested    Tobacco Social History   Tobacco Use  Smoking Status Former Smoker  . Packs/day: 0.75  . Years: 50.00  . Pack years: 37.50  . Types: Cigarettes  . Quit date: 03/07/2013  . Years since quitting: 5.5  Smokeless Tobacco Never Used     Counseling given: Not Answered   Clinical Intake:  Pre-visit preparation completed: Yes        Diabetes: No  How often do you need to have someone help you when you read instructions, pamphlets, or other written materials from your doctor or pharmacy?: 1 - Never  Interpreter Needed?: No  Information entered by :: Crystal Carroll  Past Medical History:  Diagnosis Date  . Anxiety   . GERD (gastroesophageal reflux disease)   . Hyperlipidemia   . Hypertension    Past Surgical History:   Procedure Laterality Date  . CERVICAL LAMINECTOMY  2004   Crystal Carroll   Family History  Problem Relation Age of Onset  . Heart attack Father 71       smoker, rheumatic fever as child  . Melanoma Mother 61       labia   Social History   Socioeconomic History  . Marital status: Married    Spouse name: Crystal Carroll  . Number of children: 1  . Years of education: 80  . Highest education level: Associate degree: academic program  Occupational History  . Occupation: Retired  Scientific laboratory technician  . Financial resource strain: Not on file  . Food insecurity    Worry: Not on file    Inability: Not on file  . Transportation needs    Medical: Not on file    Non-medical: Not on file  Tobacco Use  . Smoking status: Former Smoker    Packs/day: 0.75    Years: 50.00    Pack years: 37.50    Types: Cigarettes    Quit date: 03/07/2013    Years since quitting: 5.5  . Smokeless tobacco: Never Used  Substance and Sexual Activity  . Alcohol use: Yes    Alcohol/week: 0.0 standard drinks    Comment: 2 per day  . Drug use: No  . Sexual activity: Not on file  Lifestyle  . Physical activity    Days per week: Not on file  Minutes per session: Not on file  . Stress: Not on file  Relationships  . Social Herbalist on phone: Not on file    Gets together: Not on file    Attends religious service: Not on file    Active member of club or organization: Not on file    Attends meetings of clubs or organizations: Not on file    Relationship status: Not on file  Other Topics Concern  . Not on file  Social History Narrative   Married to [facility] (patient) in 1981. 2nd marriage-1 child with 1 adopted grandchild.       Retired as Statistician.       Hobbies: antiques, former Firefighter      Exercise: none currently.       Patient is right-handed. She lives with her husband in a two level home. She does not exercise.       Outpatient Encounter Medications as of 09/28/2018   Medication Sig  . alendronate (FOSAMAX) 70 MG tablet Take 1 tablet (70 mg total) by mouth once a week. Take with a full glass of water on an empty stomach.  . ALPRAZolam (XANAX) 0.25 MG tablet TAKE (1) TABLET DAILY AS NEEDED.  Marland Kitchen aspirin EC 81 MG tablet Take 81 mg by mouth daily.  Marland Kitchen buPROPion (WELLBUTRIN XL) 300 MG 24 hr tablet TAKE 1 TABLET ONCE DAILY.  . cholecalciferol (VITAMIN D3) 25 MCG (1000 UT) tablet Take 1,000 Units by mouth daily.  . citalopram (CELEXA) 20 MG tablet TAKE 1 TABLET ONCE DAILY.  Marland Kitchen Cyanocobalamin (VITAMIN B-12 PO) Take by mouth.  . diclofenac sodium (VOLTAREN) 1 % GEL Apply 2 g topically 4 (four) times daily.  Marland Kitchen lisinopril (ZESTRIL) 5 MG tablet   . Misc Natural Products (TART CHERRY ADVANCED PO) Take by mouth.  . pantoprazole (PROTONIX) 20 MG tablet Take 1 tablet (20 mg total) by mouth daily.  . rosuvastatin (CRESTOR) 40 MG tablet TAKE 1 TABLET ONCE DAILY.  Marland Kitchen TURMERIC PO Take by mouth.   No facility-administered encounter medications on file as of 09/28/2018.     Activities of Daily Living In your present state of health, do you have any difficulty performing the following activities: 09/28/2018  Hearing? N  Vision? N  Difficulty concentrating or making decisions? N  Walking or climbing stairs? Y  Comment intermittent  Dressing or bathing? N  Doing errands, shopping? N  Preparing Food and eating ? N  Using the Toilet? N  In the past six months, have you accidently leaked urine? N  Do you have problems with loss of bowel control? N  Managing your Medications? N  Managing your Finances? N  Housekeeping or managing your Housekeeping? N  Some recent data might be hidden    Patient Care Team: Crystal Olp, MD as PCP - General (Family Medicine) Crystal Partridge, DO as Consulting Physician (Neurology) Crystal Carroll as Consulting Physician (Optometry) Crystal Carroll, Crystal Crazier, MD as Consulting Physician (Endocrinology)    Assessment:   This is a routine  wellness examination for Crystal Carroll.  Exercise Activities and Dietary recommendations Current Exercise Habits: The patient does not participate in regular exercise at present  Goals   None     Fall Risk Fall Risk  09/28/2018 07/28/2018 02/20/2018 08/12/2017 07/22/2016  Falls in the past year? 1 1 1  No No  Number falls in past yr: 1 - 0 - -  Injury with Fall? 0 0 0 - -  Risk for fall due to : Impaired balance/gait;Impaired mobility - - - -  Follow up Education provided;Falls prevention discussed;Falls evaluation completed Falls evaluation completed - - -   Is the patient's home free of loose throw rugs in walkways, pet beds, electrical cords, etc?   yes      Grab bars in the bathroom? yes      Handrails on the stairs?   yes      Adequate lighting?   yes  Timed Get Up and Go performed: completed and within normal timeframe; no gait abnormalities noted today however patient is currently in PT for gait training    Depression Screen PHQ 2/9 Scores 09/28/2018 03/13/2018 08/12/2017 05/12/2017  PHQ - 2 Score 0 0 0 0  PHQ- 9 Score - - - 2     Cognitive Function- no cognitive concerns at this time      6CIT Screen 09/28/2018  What Year? 0 points  What month? 0 points  What time? 0 points  Count back from 20 0 points  Months in reverse 0 points  Repeat phrase 0 points  Total Score 0    Immunization History  Administered Date(s) Administered  . Fluad Quad(high Dose 65+) 09/28/2018  . Influenza Split 12/03/2010, 12/09/2011  . Influenza Whole 10/18/2009  . Influenza, High Dose Seasonal PF 11/02/2012, 10/18/2015, 10/21/2016, 10/21/2017  . Influenza,inj,Quad PF,6+ Mos 10/15/2013, 12/06/2014  . Influenza-Unspecified 10/08/2015  . Pneumococcal Conjugate-13 04/15/2014  . Pneumococcal Polysaccharide-23 12/03/2010  . Td 01/07/1998, 10/18/2009  . Tdap 10/18/2016  . Zoster 10/18/2009    Qualifies for Shingles Vaccine?Discussed and patient will check with pharmacy for coverage.  Patient education  handout provided    Screening Tests Health Maintenance  Topic Date Due  . COLONOSCOPY  05/31/2019  . MAMMOGRAM  08/02/2020  . TETANUS/TDAP  10/19/2026  . INFLUENZA VACCINE  Completed  . DEXA SCAN  Completed  . Hepatitis C Screening  Completed  . PNA vac Low Risk Adult  Completed    Cancer Screenings: Lung: Low Dose CT Chest recommended if Age 86-80 years, 30 pack-year currently smoking OR have quit w/in 15years. Patient does qualify. Followed by pulmonology for yearly CT scan.  Breast:  Up to date on Mammogram? Yes   Up to date of Bone Density/Dexa? Yes Colorectal: completed 05/31/14     Plan:  I have personally reviewed and addressed the Medicare Annual Wellness questionnaire and have noted the following in the patient's chart:  A. Medical and social history B. Use of alcohol, tobacco or illicit drugs  C. Current medications and supplements D. Functional ability and status E.  Nutritional status F.  Physical activity G. Advance directives H. List of other physicians I.  Hospitalizations, surgeries, and ER visits in previous 12 months J.  [place] such as hearing and vision if needed, cognitive and depression L. Referrals, records requested, and appointments- none   In addition, I have reviewed and discussed with patient certain preventive protocols, quality metrics, and best practice recommendations. A written personalized care plan for preventive services as well as general preventive health recommendations were provided to patient.   Signed,  Crystal George, Carroll  Nurse Health Advisor   Nurse Notes: no additional

## 2018-09-29 ENCOUNTER — Telehealth: Payer: Self-pay | Admitting: Neurology

## 2018-09-29 NOTE — Telephone Encounter (Signed)
Patient is calling in about Lumbar Puncture or MRI. She is wanting to know who to call and [facility] said to call us about orders for another request. Thanks!

## 2018-10-01 ENCOUNTER — Other Ambulatory Visit: Payer: Self-pay | Admitting: *Deleted

## 2018-10-01 DIAGNOSIS — M79604 Pain in right leg: Secondary | ICD-10-CM

## 2018-10-01 NOTE — Telephone Encounter (Signed)
Last ordered MRI cervical spine 09-10-18.  Re-order MRI Lumbar Spine w/o contrast- B/L leg pain. [facility].

## 2018-10-01 NOTE — Telephone Encounter (Signed)
I had ordered MRI of lumbar spine without contrast for bilateral leg pain.

## 2018-10-05 ENCOUNTER — Other Ambulatory Visit: Payer: Self-pay | Admitting: Family Medicine

## 2018-10-12 ENCOUNTER — Other Ambulatory Visit: Payer: PPO

## 2018-10-13 ENCOUNTER — Ambulatory Visit
Admission: RE | Admit: 2018-10-13 | Discharge: 2018-10-13 | Disposition: A | Discharge status: Home / Self Care | Payer: PPO | Source: Ambulatory Visit | Attending: Neurology | Admitting: Neurology

## 2018-10-13 ENCOUNTER — Other Ambulatory Visit: Payer: Self-pay

## 2018-10-13 DIAGNOSIS — M48061 Spinal stenosis, lumbar region without neurogenic claudication: Secondary | ICD-10-CM | POA: Diagnosis not present

## 2018-10-13 DIAGNOSIS — M79604 Pain in right leg: Secondary | ICD-10-CM

## 2018-10-13 DIAGNOSIS — M79605 Pain in left leg: Secondary | ICD-10-CM

## 2018-10-18 ENCOUNTER — Other Ambulatory Visit: Payer: PPO

## 2018-10-19 ENCOUNTER — Other Ambulatory Visit: Payer: Self-pay

## 2018-10-19 ENCOUNTER — Telehealth: Payer: Self-pay | Admitting: *Deleted

## 2018-10-19 NOTE — Telephone Encounter (Addendum)
Spoke to patient and relayed below. She verbalized understanding and stated she already has a neurosurgeon Dr. Arnoldo Morale and she will call him for an appt.    ----- Message from Pieter Partridge, DO sent at 10/14/2018 10:13 AM EDT -----      MRI of lumbar spine shows spinal stenosis which is likely contributing to leg pain and balance issues.  We can refer her to a spine specialist if she desires (orthopedics or neurosurgery)

## 2018-10-21 ENCOUNTER — Other Ambulatory Visit: Payer: Self-pay

## 2018-10-21 ENCOUNTER — Ambulatory Visit (INDEPENDENT_AMBULATORY_CARE_PROVIDER_SITE_OTHER): Payer: PPO | Admitting: Internal Medicine

## 2018-10-21 ENCOUNTER — Encounter: Payer: Self-pay | Admitting: Internal Medicine

## 2018-10-21 VITALS — BP 122/66 | HR 61 | Temp 98.1°F | Ht 65.0 in | Wt 149.4 lb

## 2018-10-21 DIAGNOSIS — E213 Hyperparathyroidism, unspecified: Secondary | ICD-10-CM | POA: Diagnosis not present

## 2018-10-21 LAB — BASIC METABOLIC PANEL
BUN: 14 mg/dL (ref 6–23)
CO2: 28 mEq/L (ref 19–32)
Calcium: 10.7 mg/dL — ABNORMAL HIGH (ref 8.4–10.5)
Chloride: 105 mEq/L (ref 96–112)
Creatinine, Ser: 1.11 mg/dL (ref 0.40–1.20)
GFR: 48.04 mL/min — ABNORMAL LOW (ref >60.00)
Glucose, Bld: 95 mg/dL (ref 70–99)
Potassium: 4.2 mEq/L (ref 3.5–5.1)
Sodium: 140 mEq/L (ref 135–145)

## 2018-10-21 LAB — ALBUMIN: Albumin: 4.3 g/dL (ref 3.5–5.2)

## 2018-10-21 LAB — VITAMIN D 25 HYDROXY (VIT D DEFICIENCY, FRACTURES): VITD: 64.21 ng/mL (ref 30.00–100.00)

## 2018-10-21 NOTE — Progress Notes (Signed)
Name: Crystal Carroll  MRN/ DOB: [id number], Feb 08, 1945    Age/ Sex: 73 y.o., female    PCP: Marin Olp, MD   Reason for Endocrinology Evaluation: Hypercalcemia/ Osteoporosis      Date of Initial Endocrinology Evaluation:  07/17/2018    HPI: [name] is a 73 y.o. female with a past medical history of GERD,and  gait instability . The patient presented for initial endocrinology clinic visit on 07/17/2018 for consultative assistance with her Hypercalcemia    Hypercalcemia History :  [name] has been noted to have intermittent hypercalcemia since 2008 but this has become more persistent in 05/2018.  24- hr urine Ca./cr ratio of 0.03 which is consistent with primary hyperparathyroidism (07/2018)   Osteoporosis History : Pt was diagnosed with osteoporosis:2019. Was started on Fosamax 05/2017 but reported non-compliance.   HISTORY:  Past Medical History:  Past Medical History:  Diagnosis Date  . Anxiety   . GERD (gastroesophageal reflux disease)   . Hyperlipidemia   . Hypertension    Past Surgical History:  Past Surgical History:  Procedure Laterality Date  . CERVICAL LAMINECTOMY  2004   Earle Gell      Social History:  reports that she quit smoking about 5 years ago. Her smoking use included cigarettes. She has a 37.50 pack-year smoking history. She has never used smokeless tobacco. She reports current alcohol use. She reports that she does not use drugs.  Family History: family history includes Heart attack (age of onset: 52) in her father; Melanoma (age of onset: 80) in her mother.   HOME MEDICATIONS: Allergies as of 10/21/2018      Reactions   Codeine Nausea Only   Sulfamethoxazole    REACTION: GI upset      Medication List       Accurate as of October 21, 2018  1:42 PM. If you have any questions, ask your nurse or doctor.        STOP taking these medications   alendronate 70 MG tablet Commonly known as: FOSAMAX Stopped by: Dorita Sciara, MD     TAKE these medications   ALPRAZolam 0.25 MG tablet Commonly known as: XANAX TAKE (1) TABLET DAILY AS NEEDED.   aspirin EC 81 MG tablet Take 81 mg by mouth daily.   buPROPion 300 MG 24 hr tablet Commonly known as: WELLBUTRIN XL TAKE 1 TABLET ONCE DAILY.   cholecalciferol 25 MCG (1000 UT) tablet Commonly known as: VITAMIN D3 Take 1,000 Units by mouth daily.   citalopram 20 MG tablet Commonly known as: CELEXA TAKE 1 TABLET ONCE DAILY.   diclofenac sodium 1 % Gel Commonly known as: VOLTAREN Apply 2 g topically 4 (four) times daily.   lisinopril 5 MG tablet Commonly known as: ZESTRIL   pantoprazole 20 MG tablet Commonly known as: PROTONIX Take 1 tablet (20 mg total) by mouth daily.   rosuvastatin 40 MG tablet Commonly known as: CRESTOR TAKE 1 TABLET ONCE DAILY.   TART CHERRY ADVANCED PO Take by mouth.   TURMERIC PO Take by mouth.   VITAMIN B-12 PO Take by mouth.         REVIEW OF SYSTEMS: A comprehensive ROS was conducted with the patient and is negative except as per HPI and below:  Review of Systems  HENT: Negative for congestion and sore throat.   Respiratory: Negative for cough and shortness of breath.   Cardiovascular: Positive for palpitations. Negative for chest pain.  Gastrointestinal: Negative for  diarrhea and nausea.  Genitourinary: Positive for frequency.  Musculoskeletal: Positive for joint pain.  Neurological: Positive for dizziness. Negative for headaches.  Endo/Heme/Allergies: Negative for polydipsia.  Psychiatric/Behavioral: Negative for depression. The patient is nervous/anxious.        OBJECTIVE:  VS: BP 122/66 (BP Location: Left Arm, Patient Position: Sitting, Cuff Size: Normal)   Pulse 61   Temp 98.1 F (36.7 C)   Ht 5\' 5"  (1.651 m)   Wt 149 lb 6.4 oz (67.8 kg)   LMP  (LMP Unknown)   SpO2 98%   BMI 24.86 kg/m    Wt Readings from Last 3 Encounters:  10/21/18 149 lb 6.4 oz (67.8 kg)  09/28/18 149 lb 12.8  oz (67.9 kg)  07/28/18 148 lb (67.1 kg)     EXAM: General: Pt appears well and is in NAD  Hydration: Well-hydrated with moist mucous membranes and good skin turgor  Eyes: External eye exam normal without stare, lid lag or exophthalmos.  EOM intact.  PERRL.  Ears, Nose, Throat: Hearing: Grossly intact bilaterally Dental: Good dentition  Throat: Clear without mass, erythema or exudate  Neck: General: Supple without adenopathy. Thyroid: Thyroid size normal.  No goiter or nodules appreciated. No thyroid bruit.  Lungs: Clear with good BS bilat with no rales, rhonchi, or wheezes  Heart: Auscultation: RRR.  Abdomen: Normoactive bowel sounds, soft, nontender, without masses or organomegaly palpable  Extremities:  BL LE: No pretibial edema normal. Localized area of swelling at the dorsal area of the left foot, no tenderness, or warmth   Neuro: Cranial nerves: II - XII grossly intact  Motor: Normal strength throughout DTRs: 2+ and symmetric in UE without delay in relaxation phase  Mental Status: Judgment, insight: Intact Orientation: Oriented to time, place, and person Mood and affect: No depression, anxiety, or agitation     DATA REVIEWED:  Results for ARESHA, REMER (MRN [id number]) as of 10/22/2018 08:15  Ref. Range 10/21/2018 13:31  Sodium Latest Ref Range: 135 - 145 mEq/L 140  Potassium Latest Ref Range: 3.5 - 5.1 mEq/L 4.2  Chloride Latest Ref Range: 96 - 112 mEq/L 105  CO2 Latest Ref Range: 19 - 32 mEq/L 28  Glucose Latest Ref Range: 70 - 99 mg/dL 95  BUN Latest Ref Range: 6 - 23 mg/dL 14  Creatinine Latest Ref Range: 0.40 - 1.20 mg/dL 1.11  Calcium Latest Ref Range: 8.4 - 10.5 mg/dL 10.7 (H)  Albumin Latest Ref Range: 3.5 - 5.2 g/dL 4.3  GFR Latest Ref Range: >60.00 mL/min 48.04 (L)  VITD Latest Ref Range: 30.00 - 100.00 ng/mL 64.21    ASSESSMENT/PLAN/RECOMMENDATIONS:   1.Primary Hyperparathyroidism :   - Pt is asymptomatic  - She is scheduled to see Dr. Harlow Asa next  week for parathyroidectomy evaluation  - Repeat calcium is 10.7 (corrected 10.46) this is not unusual for the calcium to fluctuate up and down.  - PTH- Pending    - Encouraged hydration - Avoid OTC calcium  - Maintain 2-3 servings of calcium in the diet  - Decrease Vitamin D 600  iu daily     2. Osteoporosis :  - Pt with hx of non-compliance with Fosamax , this was mainly due to GERD symptoms - In the setting of  Primary hyperparathyroidism ,the first line of treatment of osteoporosis  is parathyroidectomy. Typically an improvement in the bone density is expected to occur within 6 months postoperatively.  - In the mean time she was encouraged to consume 2-3 servings of calcium  a day and continue Vitamin D 3.     F/u in 4 months     Signed electronically by: Mack Guise, MD  [facility] Endocrinology  [facility] [address] [place], [place] [address] Phone: [phone] FAX: [phone]   CC: Marin Olp, [place] [address] Phone: [phone] Fax: [phone]   Return to Endocrinology clinic as below: Future Appointments  Date Time Provider [place]  10/29/2018  2:40 PM Marin Olp, MD [facility]  12/15/2018  1:00 PM Marin Olp, MD [facility]  02/23/2019  2:00 PM Tavyn Kurka, Melanie Crazier, MD [facility] None

## 2018-10-21 NOTE — Patient Instructions (Addendum)
Could try meclizine with more episodes of vertigo- if significantly worsens please see [place] back- I doubt stroke or more serious cause at this time.   For the left foot- let us know if it fails to continue to improve- I could send in an antibiotic for you  Miralax if needed for constipation  Please stop by lab before you go- lets make sure no UTI If you do not have mychart- we will call you about results within 5 business days of [place] receiving them.  If you have mychart- we will send your results within 3 business days of [place] receiving them.  If abnormal or we want to clarify a result, we will call or mychart you to make sure you receive the message.  If you have questions or concerns or don't hear within 5-7 days, please send [place] a message or call us.

## 2018-10-21 NOTE — Patient Instructions (Addendum)
-   Please stay hydrated  - Avoid over the counter calcium  - Maintain calcium in your diet 2-3 servings of calcium daily  FOODS THAT CONTAIN CALCIUM  Calcium can be found in many foods, not only in dairy products.  Dairy Foods  Yogurt (1 cup) 350 mg  Milk (1 cup) 300 mg  Cheddar cheese (1 oz.) 204 mg  Ricotta cheese, part skim (1/4 cup) 169 mg  Cottage cheese (1 cup) 150 mg  Nondairy Foods  Whole Grain Total cereal (3/4 cup) 1000 mg  Pink salmon with bones, sardines (3 oz., cooked) 181 mg  Black beans (1 cup) 103 mg  Broccoli (1 cup, cooked) 150 mg  Almonds (1 tbsp.) 50 mg  Soy Products  Soy yogurt with calcium (3/4 cup) 300 mg  Soy milk enriched with calcium (1 cup) 300 mg  Tofu, firm or extra firm (1/4 cup) 250 mg  Soy nuts, roasted/salted (1/2 cup) 103 mg

## 2018-10-21 NOTE — Progress Notes (Signed)
Phone [phone]   Subjective:  Crystal Carroll is a 73 y.o. year old very pleasant female patient who presents for/with See problem oriented charting Chief Complaint  Patient presents with  . Follow-up- hypercalcemia, CKD III   ROS- some constipation, no pretibial edema. No fever/chills. No expanding redness up the legs- actually improving.    Past Medical History-  Patient Active Problem List   Diagnosis Date Noted  . History of lacunar cerebrovascular accident (CVA) 06/18/2018    Priority: High  . Caregiver burden 04/03/2016    Priority: High  . Aortic atherosclerosis ([place]) 06/12/2017    Priority: Medium  . Vitamin B12 deficiency 05/12/2017    Priority: Medium  . Cervical radiculopathy 04/03/2016    Priority: Medium  . Essential tremor 04/18/2015    Priority: Medium  . Heavy alcohol use 04/14/2014    Priority: Medium  . CKD (chronic kidney disease), stage III 10/15/2013    Priority: Medium  . Hypercalcemia 01/28/2007    Priority: Medium  . TOBACCO USE 12/24/2006    Priority: Medium  . Hyperlipidemia 05/26/2006    Priority: Medium  . Anxiety state 05/26/2006    Priority: Medium  . Essential hypertension 05/26/2006    Priority: Medium  . Patellofemoral arthritis 03/05/2018    Priority: Low  . Adenomatous colon polyp 06/14/2014    Priority: Low  . H/O cold sores 10/15/2013    Priority: Low  . GERD 05/26/2006    Priority: Low  . Hyperparathyroidism ([place]) 07/17/2018  . Osteoporosis 03/13/2018  . DDD (degenerative disc disease), lumbar 03/05/2018  . Unstable gait 01/20/2018  . Palpitations 05/12/2017  . Diarrhea 04/14/2014    Medications- reviewed and updated Current Outpatient Medications  Medication Sig Dispense Refill  . ALPRAZolam (XANAX) 0.25 MG tablet TAKE (1) TABLET DAILY AS NEEDED. 30 tablet 0  . aspirin EC 81 MG tablet Take 81 mg by mouth daily.    Marland Kitchen buPROPion (WELLBUTRIN XL) 300 MG 24 hr tablet TAKE 1 TABLET ONCE DAILY. 90 tablet 0  . citalopram  (CELEXA) 20 MG tablet TAKE 1 TABLET ONCE DAILY. 90 tablet 0  . Cyanocobalamin (VITAMIN B-12 PO) Take by mouth.    . diclofenac sodium (VOLTAREN) 1 % GEL Apply 2 g topically 4 (four) times daily. 100 g 3  . Investigational vitamin D 600 UNITS capsule SWOG S0812 Take 1 capsule (600 Units total) by mouth daily. Take with food. 30 capsule 11  . pantoprazole (PROTONIX) 20 MG tablet Take 1 tablet (20 mg total) by mouth daily. 90 tablet 3  . rosuvastatin (CRESTOR) 40 MG tablet TAKE 1 TABLET ONCE DAILY. 90 tablet 0  . TURMERIC PO Take by mouth.    . Misc Natural Products (TART CHERRY ADVANCED PO) Take by mouth.       Objective:  BP 120/60   Pulse 70   Temp 98.9 F (37.2 C)   Ht 5\' 5"  (1.651 m)   Wt 147 lb 9.6 oz (67 kg)   LMP  (LMP Unknown)   SpO2 98%   BMI 24.56 kg/m  Gen: NAD, resting comfortably CV: RRR no murmurs rubs or gallops Lungs: CTAB no crackles, wheeze, rhonchi Abdomen: soft/nontender/nondistended/normal bowel sounds.  Ext: no edema Skin: warm, dry, left foot with mild erythema and warmth and minimal tenderness Neuro: more stable gait    Assessment and Plan   Left Foot swelling S:pt c/o red and puffy place on left foot sensitive to touch - 7-10 days of symptoms. Slight itching today. Seems to  be improving- less red, less puffy, less tender. Thinks she may have had an insect bite A/P: this may be a severe local reaction to insect bite or a localized cellulitis that is healing- regardless we opted to monitor and if this worsens - she will call and would likely rx  Keflex 250mg  TID for 7 days  Dizzy Spells S:pt states she has had 2 spells 2 weeks a part. One was with standing and one was with sitting. Room spinning sensation- she thinks may have happened with head movement/motion. Episodes last about a minute then resolve. No hearing loss or tinnitus. Closes eyes and episodes go away. Denies nausea  A/P: patient is asymptomatic today. Her symptoms  Sound like they may have been  mild episodes of BPPV. Regardless symptoms have resolved at this point- she is going to monitor and come back and see [place] if symptoms recur/worsen.   CKD III S: Patient had questions about CKD III diagnosis. We reviewed her labs and they have been consistently with GFRs in 30s or low 40s- on last check recently GFR up slightly to 48  A/P: CKD III appears stable. we reviewed trend in GFR- appears may have had some improvement coming off ace-inhibitor. We discussed continuing BP control, lipid control, and avoiding nsaids.   Can take tylenol. Would like to further reduce PPI but she is having some breakthrough with reducing Pantoprazole 40mg  to 20mg  - indigestion 2-3x a week- tums will knock it out.   # Gait Instability- saw neurology. MRI cervical and lumbar spine completed. Has also done PT and seems to be improving. Lumbar spine shows moderately severe to severe spinal stenosis- plans to have neurosurgery consultation for their opinion but she is thankful for improvement.   # polyuria/nocturia  S:patient has noted issues for last month. No blood in urine or back pain. No burning with urination.  No incontinence. Drinking more due to hypercalcemia and endocrine's instructions. Nocturia 3x a night A/P: suspect symptoms are from increased fluid intake but will get UA and culture to rule out UTI.    # Hyperparathyroidism- Constipation weekend before last and has never been an issues- we discussed could be related to parathyroid issues (upcoming parathyroidectomy with Dr. Harlow Asa). Discussed could use miralax if needed if recurrent.   Recommended follow up: scheduled for December follow up  Future Appointments  Date Time Provider [place]  11/06/2018 10:00 AM [facility] INJ 1 [facility]  11/06/2018 10:30 AM MC-US 2 [facility]  11/06/2018 12:00 PM MC-NM 3 [facility]NM [facility]  11/11/2018  2:40 PM Donato Heinz, MD [facility]  12/15/2018  1:00 PM Marin Olp, MD [facility]  02/23/2019   2:00 PM Shamleffer, Melanie Crazier, MD [facility] None    Lab/Order associations:   ICD-10-CM   1. Stage 3a chronic kidney disease  N18.31   2. Localized swelling of left foot  R22.42   3. Dizzy spells  R42   4. Polyuria  R35.8 Urine Culture    POCT Urinalysis Dipstick (Automated)    POCT Urinalysis Dipstick (Automated)  5. Unstable gait  R26.81   6. Constipation, unspecified constipation type  K59.00    Time Stamp The duration of face-to-face time during this visit was greater than 25 minutes. Greater than 50% of this time was spent in counseling, explanation of diagnosis, planning of further management, and/or coordination of care including counseling on ckd III, hyperparathyroidism and associated constipation, discussiong possible causes of dizzy spells and follow up indications.  Return precautions advised.  [name], MD

## 2018-10-22 LAB — PTH, INTACT AND CALCIUM
Calcium: 10.5 mg/dL — ABNORMAL HIGH (ref 8.6–10.4)
PTH: 38 pg/mL (ref 14–64)

## 2018-10-22 MED ORDER — VITAMIN D 600 IU CAPSULE SWOG S0812: 600.0000 [IU] | ORAL_CAPSULE | Freq: Every day | ORAL | 11 refills | Status: DC

## 2018-10-22 NOTE — Therapy (Signed)
[place], [place], [address] Phone: [phone]   Fax:  [phone]  Physical Therapy Treatment/Discharge  Patient Details  Name: ZALI KAMAKA MRN: [id number] Date of Birth: Aug 06, 1945 Referring Provider (PT): Hulan Saas, DO   Encounter Date: 09/02/2018    Past Medical History:  Diagnosis Date  . Anxiety   . GERD (gastroesophageal reflux disease)   . Hyperlipidemia   . Hypertension     Past Surgical History:  Procedure Laterality Date  . CERVICAL LAMINECTOMY  2004   Earle Gell    There were no vitals filed for this visit.                              PT Short Term Goals - 09/02/18 1432      PT SHORT TERM GOAL #1   Title  Independent with HEP    Baseline  met for initial HEP    Time  3    Period  Weeks    Status  Achieved      PT SHORT TERM GOAL #2   Title  Improve TUG to <14 sec for decreased fall risk    Baseline  13.36 seconds    Time  3    Period  Weeks    Status  On-going        PT Long Term Goals - 09/02/18 1432      PT LONG TERM GOAL #1   Title  Improve FOTO score to 33% or less impairment    Baseline  43% limited    Time  6    Period  Weeks    Status  On-going      PT LONG TERM GOAL #2   Title  Right knee ext strength 5/5 to improve ability for stair navigation at home and transfers from low chairs    Baseline  5/5    Time  6    Period  Weeks    Status  Achieved      PT LONG TERM GOAL #3   Title  Improve Romberg foam, EC to 10 sec or greater with feet 5 in. apart on Airex for improved balance to decrease fall risk    Baseline  not assessed today-at last attempt pt. required 2 attempts but able >10 sec second attempt with feet 5 in. apart, continue goal for 1 attempt feet 5 in. apart on foam with EC    Time  6    Period  Weeks    Status  On-going      PT LONG TERM GOAL #4   Title  Tolerate standing/ambulation for periods at  least 30-40 min to go grocery shopping with thigh pain 2/10 or less    Baseline  progressing    Time  6    Period  Weeks    Status  On-going              Patient will benefit from skilled therapeutic intervention in order to improve the following deficits and impairments:  Abnormal gait, Decreased balance, Decreased mobility, Pain, Impaired flexibility, Decreased strength, Decreased activity tolerance, Decreased range of motion, Difficulty walking  Visit Diagnosis: Low back pain, unspecified back pain laterality, unspecified chronicity, unspecified whether sciatica present  Difficulty in walking, not elsewhere classified     Problem List Patient Active Problem List   Diagnosis Date Noted  . Hyperparathyroidism ([place]) 07/17/2018  . History of  lacunar cerebrovascular accident (CVA) 06/18/2018  . Osteoporosis 03/13/2018  . Patellofemoral arthritis 03/05/2018  . DDD (degenerative disc disease), lumbar 03/05/2018  . Unstable gait 01/20/2018  . Aortic atherosclerosis ([place]) 06/12/2017  . Palpitations 05/12/2017  . Vitamin B12 deficiency 05/12/2017  . Cervical radiculopathy 04/03/2016  . Caregiver burden 04/03/2016  . Essential tremor 04/18/2015  . Adenomatous colon polyp 06/14/2014  . Diarrhea 04/14/2014  . Heavy alcohol use 04/14/2014  . H/O cold sores 10/15/2013  . CKD (chronic kidney disease), stage III 10/15/2013  . Hypercalcemia 01/28/2007  . TOBACCO USE 12/24/2006  . Hyperlipidemia 05/26/2006  . Anxiety state 05/26/2006  . Essential hypertension 05/26/2006  . GERD 05/26/2006      PHYSICAL THERAPY DISCHARGE SUMMARY  Visits from Start of Care: 15  Current functional level related to goals / functional outcomes: Patient did not return for further therapy after last session 09/02/18, current status unknown   Remaining deficits: NA   Education / Equipment: NA Plan: Patient agrees to discharge.  Patient goals were partially met. Patient is being discharged  due to not returning since the last visit.  ?????          [name], PT, DPT 10/22/18 3:53 PM   [place] [facility] [address] [place], [place], [address] Phone: [phone]   Fax:  [phone]  Name: OLIVIANA MCGAHEE MRN: [id number] Date of Birth: [birth year]

## 2018-10-23 ENCOUNTER — Other Ambulatory Visit: Payer: Self-pay | Admitting: Family Medicine

## 2018-10-28 DIAGNOSIS — E210 Primary hyperparathyroidism: Secondary | ICD-10-CM | POA: Diagnosis not present

## 2018-10-28 DIAGNOSIS — M810 Age-related osteoporosis without current pathological fracture: Secondary | ICD-10-CM | POA: Diagnosis not present

## 2018-10-29 ENCOUNTER — Encounter: Payer: Self-pay | Admitting: Internal Medicine

## 2018-10-29 ENCOUNTER — Ambulatory Visit (INDEPENDENT_AMBULATORY_CARE_PROVIDER_SITE_OTHER): Payer: PPO | Admitting: Family Medicine

## 2018-10-29 ENCOUNTER — Other Ambulatory Visit (HOSPITAL_COMMUNITY): Payer: Self-pay | Admitting: Surgery

## 2018-10-29 ENCOUNTER — Other Ambulatory Visit: Payer: Self-pay

## 2018-10-29 ENCOUNTER — Other Ambulatory Visit: Payer: Self-pay | Admitting: Surgery

## 2018-10-29 ENCOUNTER — Encounter: Payer: Self-pay | Admitting: Family Medicine

## 2018-10-29 VITALS — BP 120/60 | HR 70 | Temp 98.9°F | Ht 65.0 in | Wt 147.6 lb

## 2018-10-29 DIAGNOSIS — R358 Other polyuria: Secondary | ICD-10-CM | POA: Diagnosis not present

## 2018-10-29 DIAGNOSIS — N1831 Chronic kidney disease, stage 3a: Secondary | ICD-10-CM

## 2018-10-29 DIAGNOSIS — R2681 Unsteadiness on feet: Secondary | ICD-10-CM | POA: Diagnosis not present

## 2018-10-29 DIAGNOSIS — R2242 Localized swelling, mass and lump, left lower limb: Secondary | ICD-10-CM

## 2018-10-29 DIAGNOSIS — E210 Primary hyperparathyroidism: Secondary | ICD-10-CM

## 2018-10-29 DIAGNOSIS — K5900 Constipation, unspecified: Secondary | ICD-10-CM | POA: Diagnosis not present

## 2018-10-29 DIAGNOSIS — R3589 Other polyuria: Secondary | ICD-10-CM

## 2018-10-29 DIAGNOSIS — R42 Dizziness and giddiness: Secondary | ICD-10-CM

## 2018-10-29 LAB — POC URINALSYSI DIPSTICK (AUTOMATED)
Bilirubin, UA: NEGATIVE
Blood, UA: POSITIVE
Glucose, UA: NEGATIVE
Ketones, UA: NEGATIVE
Leukocytes, UA: NEGATIVE
Nitrite, UA: NEGATIVE
Protein, UA: POSITIVE — AB
Spec Grav, UA: 1.02 (ref 1.010–1.025)
Urobilinogen, UA: 1 E.U./dL
pH, UA: 6 (ref 5.0–8.0)

## 2018-10-30 LAB — URINE CULTURE
MICRO NUMBER:: 1019001
SPECIMEN QUALITY:: ADEQUATE

## 2018-11-02 ENCOUNTER — Telehealth: Payer: Self-pay | Admitting: Family Medicine

## 2018-11-02 NOTE — Telephone Encounter (Signed)
Pharmacy called stating pt is requesting to have shingrix vaccine sent to pharmacy. Please advise.    [place], [place] [address]  Phone: [phone] Fax: [phone]  Not a 24 hour pharmacy; exact hours not known.

## 2018-11-02 NOTE — Telephone Encounter (Signed)
See note

## 2018-11-04 ENCOUNTER — Other Ambulatory Visit: Payer: Self-pay

## 2018-11-04 MED ORDER — ZOSTER VAC RECOMB ADJUVANTED 50 MCG/0.5ML IM SUSR: 0.5000 mL | Freq: Once | INTRAMUSCULAR | 0 refills | Status: AC

## 2018-11-04 NOTE — Telephone Encounter (Signed)
Rx sent to pharmacy   

## 2018-11-05 ENCOUNTER — Other Ambulatory Visit: Payer: Self-pay

## 2018-11-05 ENCOUNTER — Ambulatory Visit (HOSPITAL_COMMUNITY)
Admission: RE | Admit: 2018-11-05 | Discharge: 2018-11-05 | Disposition: A | Discharge status: Home / Self Care | Payer: PPO | Source: Ambulatory Visit | Attending: Surgery | Admitting: Surgery

## 2018-11-05 DIAGNOSIS — E210 Primary hyperparathyroidism: Secondary | ICD-10-CM | POA: Insufficient documentation

## 2018-11-05 DIAGNOSIS — E041 Nontoxic single thyroid nodule: Secondary | ICD-10-CM | POA: Diagnosis not present

## 2018-11-06 ENCOUNTER — Ambulatory Visit (HOSPITAL_COMMUNITY): Payer: PPO

## 2018-11-06 ENCOUNTER — Other Ambulatory Visit: Payer: Self-pay | Admitting: Surgery

## 2018-11-06 ENCOUNTER — Encounter (HOSPITAL_COMMUNITY)
Admission: RE | Admit: 2018-11-06 | Discharge: 2018-11-06 | Disposition: A | Discharge status: Home / Self Care | Payer: PPO | Source: Ambulatory Visit | Attending: Surgery | Admitting: Surgery

## 2018-11-06 DIAGNOSIS — E210 Primary hyperparathyroidism: Secondary | ICD-10-CM

## 2018-11-06 DIAGNOSIS — E041 Nontoxic single thyroid nodule: Secondary | ICD-10-CM

## 2018-11-06 MED ORDER — TECHNETIUM TC 99M SESTAMIBI GENERIC - CARDIOLITE
25.0000 | Freq: Once | INTRAVENOUS | Status: AC | PRN
  Administered 2018-11-06: 25 via INTRAVENOUS

## 2018-11-08 DIAGNOSIS — I350 Nonrheumatic aortic (valve) stenosis: Secondary | ICD-10-CM

## 2018-11-08 HISTORY — DX: Nonrheumatic aortic (valve) stenosis: I35.0

## 2018-11-10 ENCOUNTER — Other Ambulatory Visit: Payer: Self-pay

## 2018-11-10 DIAGNOSIS — R3129 Other microscopic hematuria: Secondary | ICD-10-CM

## 2018-11-11 ENCOUNTER — Ambulatory Visit: Payer: PPO | Admitting: Cardiology

## 2018-11-11 ENCOUNTER — Encounter: Payer: Self-pay | Admitting: Cardiology

## 2018-11-11 ENCOUNTER — Other Ambulatory Visit: Payer: Self-pay

## 2018-11-11 VITALS — BP 114/58 | HR 66 | Ht 64.0 in | Wt 149.2 lb

## 2018-11-11 DIAGNOSIS — R9431 Abnormal electrocardiogram [ECG] [EKG]: Secondary | ICD-10-CM | POA: Diagnosis not present

## 2018-11-11 DIAGNOSIS — I498 Other specified cardiac arrhythmias: Secondary | ICD-10-CM | POA: Diagnosis not present

## 2018-11-11 DIAGNOSIS — R002 Palpitations: Secondary | ICD-10-CM

## 2018-11-11 NOTE — Patient Instructions (Signed)
Medication Instructions:  Continue same medications   Lab Work: None ordered   Testing/Procedures: Schedule Echo  Follow-Up: At [facility], you and your health needs are our priority.  As part of our continuing mission to provide you with exceptional heart care, we have created designated Provider Care Teams.  These Care Teams include your primary Cardiologist (physician) and Advanced Practice Providers (APPs -  Physician Assistants and Nurse Practitioners) who all work together to provide you with the care you need, when you need it.  Your next appointment:  3 months   The format for your next appointment: Office    Provider:  Dr.Schumann

## 2018-11-11 NOTE — Progress Notes (Signed)
Cardiology Office Note:    Date:  11/11/2018   ID:  Crystal Carroll, DOB 1945-05-24, MRN [id number]  PCP:  Marin Olp, MD  Cardiologist:  No primary care provider on file.  Electrophysiologist:  None   Referring MD: Marin Olp, MD   Chief Complaint  Patient presents with  . Palpitations    History of Present Illness:     Crystal Carroll is a 73 y.o. female with a hx of hypertension, hyperlipidemia, hyperparathyroidism, CKD stage III who is referred by Dr. Yong Channel for evaluation of palpitations.  She reports that she has been having palpitations for the past year.  Does not feel like her heart is racing or irregular, just has an abnormal awareness of her heartbeat at times.  Can last for up to 1 minute.  Not associated with any other symptoms.  Has not had any palpitations in weeks.  She reports that she had been having palpitations.  Has been occurring for over 1 year.  Does not have feeling like heart is racing.  Can last for up to 1 minute.  No chest pain, dyspnea.  No palpitations in weeks.  She wore a 30-day cardiac monitor, which showed predominantly sinus rhythm and most of the triggered events were sinus rhythm.  However there were occasional episodes of accelerated junctional rhythm and 2 of her triggered events involved a junctional rhythm.  She reports that starting a month ago she has had 2 episodes where she felt like the room is spinning.  No syncope.  States that she would close her eyes and would but currently resolved.  She saw Dr. Yong Channel, who felt it was mild BPPV and would monitor for now.  Smoked 50 years, 1ppd, quit 5 years ago.  Father died of MI at 69.    Past Medical History:  Diagnosis Date  . Anxiety   . GERD (gastroesophageal reflux disease)   . Hyperlipidemia   . Hypertension     Past Surgical History:  Procedure Laterality Date  . CERVICAL LAMINECTOMY  2004   Earle Gell    Current Medications: Current Meds  Medication Sig  .  ALPRAZolam (XANAX) 0.25 MG tablet TAKE (1) TABLET DAILY AS NEEDED.  Marland Kitchen aspirin EC 81 MG tablet Take 81 mg by mouth daily.  Marland Kitchen buPROPion (WELLBUTRIN XL) 300 MG 24 hr tablet TAKE 1 TABLET ONCE DAILY.  . citalopram (CELEXA) 20 MG tablet TAKE 1 TABLET ONCE DAILY.  Marland Kitchen Cyanocobalamin (VITAMIN B-12 PO) Take by mouth.  . diclofenac sodium (VOLTAREN) 1 % GEL Apply 2 g topically 4 (four) times daily.  . Investigational vitamin D 600 UNITS capsule SWOG S0812 Take 1 capsule (600 Units total) by mouth daily. Take with food.  . Misc Natural Products (TART CHERRY ADVANCED PO) Take by mouth.  . pantoprazole (PROTONIX) 20 MG tablet Take 1 tablet (20 mg total) by mouth daily.  . rosuvastatin (CRESTOR) 40 MG tablet TAKE 1 TABLET ONCE DAILY.  Marland Kitchen TURMERIC PO Take by mouth.     Allergies:   Codeine and Sulfamethoxazole   Social History   Socioeconomic History  . Marital status: Married    Spouse name: Leroy Sea  . Number of children: 1  . Years of education: 46  . Highest education level: Associate degree: academic program  Occupational History  . Occupation: Retired  Scientific laboratory technician  . Financial resource strain: Not on file  . Food insecurity    Worry: Not on file    Inability: Not on  file  . Transportation needs    Medical: Not on file    Non-medical: Not on file  Tobacco Use  . Smoking status: Former Smoker    Packs/day: 0.75    Years: 50.00    Pack years: 37.50    Types: Cigarettes    Quit date: 03/07/2013    Years since quitting: 5.6  . Smokeless tobacco: Never Used  Substance and Sexual Activity  . Alcohol use: Yes    Alcohol/week: 0.0 standard drinks    Comment: 2 per day  . Drug use: No  . Sexual activity: Not on file  Lifestyle  . Physical activity    Days per week: Not on file    Minutes per session: Not on file  . Stress: Not on file  Relationships  . Social Herbalist on phone: Not on file    Gets together: Not on file    Attends religious service: Not on file    Active  member of club or organization: Not on file    Attends meetings of clubs or organizations: Not on file    Relationship status: Not on file  Other Topics Concern  . Not on file  Social History Narrative   Married to [facility] (patient) in 1981. 2nd marriage-1 child with 1 adopted grandchild.       Retired as Statistician.       Hobbies: antiques, former Firefighter      Exercise: none currently.       Patient is right-handed. She lives with her husband in a two level home. She does not exercise.        Family History: The patient's family history includes Heart attack (age of onset: 33) in her father; Melanoma (age of onset: 90) in her mother.  ROS:   Please see the history of present illness.     All other systems reviewed and are negative.  EKGs/Labs/Other Studies Reviewed:    The following studies were reviewed today:   EKG:  EKG is  ordered today.  The ekg ordered today demonstrates normal sinus rhythm, rate 66, nonspecific <1 mmST depression in leads II, aVF, V4 through 6  Recent Labs: 05/22/2018: ALT 9; Hemoglobin 13.8; Platelets 219 10/21/2018: BUN 14; Creatinine, Ser 1.11; Potassium 4.2; Sodium 140  Recent Lipid Panel    Component Value Date/Time   CHOL 153 05/22/2018 1513   TRIG 92 05/22/2018 1513   HDL 72 05/22/2018 1513   CHOLHDL 2.1 05/22/2018 1513   VLDL 26.2 05/12/2017 1107   LDLCALC 63 05/22/2018 1513   LDLDIRECT 106 (H) 07/19/2016 1603    Physical Exam:    VS:  BP (!) 114/58   Pulse 66   Ht 5\' 4"  (1.626 m)   Wt 149 lb 3.2 oz (67.7 kg)   LMP  (LMP Unknown)   BMI 25.61 kg/m     Wt Readings from Last 3 Encounters:  11/11/18 149 lb 3.2 oz (67.7 kg)  10/29/18 147 lb 9.6 oz (67 kg)  10/21/18 149 lb 6.4 oz (67.8 kg)     GEN:  Well nourished, well developed in no acute distress HEENT: Normal NECK: No JVD; No carotid bruits LYMPHATICS: No lymphadenopathy CARDIAC: RRR, no murmurs, rubs, gallops RESPIRATORY:  Clear to auscultation  without rales, wheezing or rhonchi  ABDOMEN: Soft, non-tender, non-distended MUSCULOSKELETAL:  No edema; No deformity  SKIN: Warm and dry NEUROLOGIC:  Alert and oriented x 3 PSYCHIATRIC:  Normal affect  ASSESSMENT:    1. Palpitations   2. Nonspecific abnormal electrocardiogram (ECG) (EKG)    PLAN:    In order of problems listed above:  Junctional rhythm: Occasional episodes of accelerated junctional rhythm noted on monitor.  Rates in 98s to 22s.  Suspect likely has some degree of sick sinus syndrome and had discussed getting a exercise treadmill test to evaluate for chronotropic incompetence.  She declines a treadmill test at this time as did not think that she could walk on a treadmill due to her knee pain.  She has no evidence of symptomatic bradycardia, no indication for pacemaker at this time.  Will check TTE to evaluate for structural heart disease.  Recommend avoidance of AV nodal blocking agents.  Hyperlipidemia: On rosuvastatin 40 mg daily.  Last LDL 63 on 05/22/2018  HTN: reported history but not currently on any medications  RTC in 3 months  Medication Adjustments/Labs and Tests Ordered: Current medicines are reviewed at length with the patient today.  Concerns regarding medicines are outlined above.  Orders Placed This Encounter  Procedures  . EKG 12-Lead  . ECHOCARDIOGRAM COMPLETE   No orders of the defined types were placed in this encounter.   Patient Instructions  Medication Instructions:  Continue same medications   Lab Work: None ordered   Testing/Procedures: Schedule Echo  Follow-Up: At [facility], you and your health needs are our priority.  As part of our continuing mission to provide you with exceptional heart care, we have created designated Provider Care Teams.  These Care Teams include your primary Cardiologist (physician) and Advanced Practice Providers (APPs -  Physician Assistants and Nurse Practitioners) who all work together to provide  you with the care you need, when you need it.  Your next appointment:  3 months   The format for your next appointment: Office    Provider:  Dr.Kaelen Caughlin       Signed, Donato Heinz, MD  11/11/2018 5:00 PM    [place]

## 2018-11-18 ENCOUNTER — Other Ambulatory Visit (HOSPITAL_COMMUNITY)
Admission: RE | Admit: 2018-11-18 | Discharge: 2018-11-18 | Disposition: A | Discharge status: Home / Self Care | Payer: PPO | Source: Ambulatory Visit | Attending: Surgery | Admitting: Surgery

## 2018-11-18 ENCOUNTER — Ambulatory Visit
Admission: RE | Admit: 2018-11-18 | Discharge: 2018-11-18 | Disposition: A | Discharge status: Home / Self Care | Payer: PPO | Source: Ambulatory Visit | Attending: Surgery | Admitting: Surgery

## 2018-11-18 DIAGNOSIS — E041 Nontoxic single thyroid nodule: Secondary | ICD-10-CM

## 2018-11-18 DIAGNOSIS — D440 Neoplasm of uncertain behavior of thyroid gland: Secondary | ICD-10-CM | POA: Insufficient documentation

## 2018-11-18 DIAGNOSIS — E0789 Other specified disorders of thyroid: Secondary | ICD-10-CM | POA: Diagnosis not present

## 2018-11-18 NOTE — Procedures (Signed)
PROCEDURE SUMMARY:  Using direct ultrasound guidance, 5 passes were made using 25 g needles into the nodule within the left lobe of the thyroid.   Ultrasound was used to confirm needle placements on all occasions.   EBL = trace  Specimens were sent to Pathology for analysis.  See procedure note under Imaging tab in Epic for full procedure details.  Rainy Rothman S Oleg Oleson PA-C 11/18/2018 2:25 PM

## 2018-11-19 LAB — CYTOLOGY - NON PAP

## 2018-11-24 ENCOUNTER — Other Ambulatory Visit: Payer: Self-pay

## 2018-11-24 ENCOUNTER — Ambulatory Visit (HOSPITAL_COMMUNITY): Payer: PPO | Attending: Cardiology

## 2018-11-24 DIAGNOSIS — R9431 Abnormal electrocardiogram [ECG] [EKG]: Secondary | ICD-10-CM

## 2018-11-24 DIAGNOSIS — R002 Palpitations: Secondary | ICD-10-CM

## 2018-11-26 DIAGNOSIS — E041 Nontoxic single thyroid nodule: Secondary | ICD-10-CM | POA: Diagnosis not present

## 2018-11-27 ENCOUNTER — Other Ambulatory Visit: Payer: Self-pay

## 2018-11-27 DIAGNOSIS — I350 Nonrheumatic aortic (valve) stenosis: Secondary | ICD-10-CM

## 2018-12-09 ENCOUNTER — Other Ambulatory Visit: Payer: PPO

## 2018-12-11 ENCOUNTER — Encounter (HOSPITAL_COMMUNITY): Payer: Self-pay

## 2018-12-15 ENCOUNTER — Other Ambulatory Visit: Payer: Self-pay

## 2018-12-15 ENCOUNTER — Ambulatory Visit (INDEPENDENT_AMBULATORY_CARE_PROVIDER_SITE_OTHER): Payer: PPO | Admitting: Family Medicine

## 2018-12-15 ENCOUNTER — Encounter: Payer: Self-pay | Admitting: Family Medicine

## 2018-12-15 VITALS — BP 108/62 | HR 65 | Temp 96.5°F | Ht 64.0 in | Wt 149.2 lb

## 2018-12-15 DIAGNOSIS — N1831 Chronic kidney disease, stage 3a: Secondary | ICD-10-CM

## 2018-12-15 DIAGNOSIS — E785 Hyperlipidemia, unspecified: Secondary | ICD-10-CM | POA: Diagnosis not present

## 2018-12-15 DIAGNOSIS — I10 Essential (primary) hypertension: Secondary | ICD-10-CM

## 2018-12-15 DIAGNOSIS — I700 Atherosclerosis of aorta: Secondary | ICD-10-CM | POA: Diagnosis not present

## 2018-12-15 DIAGNOSIS — K219 Gastro-esophageal reflux disease without esophagitis: Secondary | ICD-10-CM

## 2018-12-15 DIAGNOSIS — F411 Generalized anxiety disorder: Secondary | ICD-10-CM | POA: Diagnosis not present

## 2018-12-15 DIAGNOSIS — K921 Melena: Secondary | ICD-10-CM

## 2018-12-15 DIAGNOSIS — N898 Other specified noninflammatory disorders of vagina: Secondary | ICD-10-CM | POA: Insufficient documentation

## 2018-12-15 DIAGNOSIS — N951 Menopausal and female climacteric states: Secondary | ICD-10-CM | POA: Insufficient documentation

## 2018-12-15 NOTE — Patient Instructions (Addendum)
poor control of reflux- continue protonix 19m but add pepcid/famotidine over the counter before dinner.    No other changes today  Great to see you and Merry Christmas!   Pick up stool collection kit- if blood in stool we will need to get early colonoscopy. It sounds like this is a superficial irritation. Try wet wipes instead of toilet paper as this will likely be more gentle   Avoid hard stools  Recommended follow [id number]:[id number] about 4 months (around 04/15/2019) for hyperlipidemia, kidney disease, and HTN.

## 2018-12-15 NOTE — Progress Notes (Signed)
Phone [phone] In person visit   Subjective:   Crystal Carroll is a 73 y.o. year old very pleasant female patient who presents for/with See problem oriented charting Chief Complaint  Patient presents with  . Follow-up    Kidney disease  . Hypertension  . Hyperlipidemia    ROS- Review of Systems  Constitutional: Negative.   HENT: Negative.   Eyes: Negative.   Respiratory: Negative.   Cardiovascular: Negative.   Gastrointestinal: Positive for blood in stool.  Genitourinary: Negative.   Musculoskeletal: Positive for back pain, joint pain and neck pain.  Skin: Negative.   Neurological: Negative.   Endo/Heme/Allergies: Negative.   Psychiatric/Behavioral: Negative.      This visit occurred during the SARS-CoV-2 public health emergency.  Safety protocols were in place, including screening questions prior to the visit, additional usage of staff PPE, and extensive cleaning of exam room while observing appropriate contact time as indicated for disinfecting solutions.   Past Medical History-  Patient Active Problem List   Diagnosis Date Noted  . History of lacunar cerebrovascular accident (CVA) 06/18/2018    Priority: High  . Caregiver burden 04/03/2016    Priority: High  . Hypercalcemia 01/28/2007    Priority: High  . Aortic atherosclerosis ([place]) 06/12/2017    Priority: Medium  . Vitamin B12 deficiency 05/12/2017    Priority: Medium  . Cervical radiculopathy 04/03/2016    Priority: Medium  . Essential tremor 04/18/2015    Priority: Medium  . Heavy alcohol use 04/14/2014    Priority: Medium  . CKD (chronic kidney disease), stage III 10/15/2013    Priority: Medium  . TOBACCO USE 12/24/2006    Priority: Medium  . Hyperlipidemia 05/26/2006    Priority: Medium  . Anxiety state 05/26/2006    Priority: Medium  . Essential hypertension 05/26/2006    Priority: Medium  . Patellofemoral arthritis 03/05/2018    Priority: Low  . Adenomatous colon polyp 06/14/2014   Priority: Low  . H/O cold sores 10/15/2013    Priority: Low  . GERD 05/26/2006    Priority: Low  . Menopausal flushing 12/15/2018  . Pruritus of vagina 12/15/2018  . Hyperparathyroidism ([place]) 07/17/2018  . Osteoporosis 03/13/2018  . DDD (degenerative disc disease), lumbar 03/05/2018  . Unstable gait 01/20/2018  . Palpitations 05/12/2017  . Diarrhea 04/14/2014    Medications- reviewed and updated Current Outpatient Medications  Medication Sig Dispense Refill  . ALPRAZolam (XANAX) 0.25 MG tablet TAKE (1) TABLET DAILY AS NEEDED. 30 tablet 0  . aspirin EC 81 MG tablet Take 81 mg by mouth daily.    Marland Kitchen buPROPion (WELLBUTRIN XL) 300 MG 24 hr tablet TAKE 1 TABLET ONCE DAILY. 90 tablet 0  . citalopram (CELEXA) 20 MG tablet TAKE 1 TABLET ONCE DAILY. 90 tablet 0  . Investigational vitamin D 600 UNITS capsule SWOG S0812 Take 1 capsule (600 Units total) by mouth daily. Take with food. 30 capsule 11  . Misc Natural Products (TART CHERRY ADVANCED PO) Take by mouth.    . pantoprazole (PROTONIX) 20 MG tablet Take 1 tablet (20 mg total) by mouth daily. 90 tablet 3  . rosuvastatin (CRESTOR) 40 MG tablet TAKE 1 TABLET ONCE DAILY. 90 tablet 0  . TURMERIC PO Take by mouth.    . Cyanocobalamin (VITAMIN B-12 PO) Take by mouth.    . diclofenac sodium (VOLTAREN) 1 % GEL Apply 2 g topically 4 (four) times daily. (Patient not taking: Reported on 12/15/2018) 100 g 3   No current facility-administered medications  for this visit.      Objective:  BP 108/62 (BP Location: Left Arm, Patient Position: Sitting, Cuff Size: Normal)   Pulse 65   Temp (!) 96.5 F (35.8 C) (Temporal)   Ht '5\' 4"'  (1.626 m)   Wt 149 lb 3.2 oz (67.7 kg)   LMP  (LMP Unknown)   SpO2 94%   BMI 25.61 kg/m  Gen: NAD, resting comfortably CV: RRR no murmurs rubs or gallops Lungs: CTAB no crackles, wheeze, rhonchi Abdomen: soft/nontender/nondistended/normal bowel sounds.  Ext: no edema Skin: warm, dry    Assessment and Plan   #foot  improved with antibiotic   #Anxiety/stress management S: Compliant with Celexa 20 mg, Wellbutrin 300 mg extended release.  Also takes sparing Xanax as needed for anxiety  stress- caring for husband with parkinsons who misses spin and swim classes and that has been really challenging. He would like to get on bike but she does not think she could get him on a bike.  1 wine spritzer per day - rarely a second beverage A/P: Stable- continue current medicines  #GERD S: Compliant with Protonix 40 mg. We reduced to 23m but she is getting some breakthrough reflux in the evening.  A/P: poor control of reflux- continue protonix 220mbut add pepcid over the counter before dinner.    #Hypertension/CKD stage III S: Patient remains off lisinopril unfortunately blood pressure remains controlled  -GFR has been stable largely in the 40s lately- in the past had been in the 30s- high 30s last check A/P: hopefully stable- we are trying to reduce protonix to avoid any potential strain on kidneys. Blood pressure remains controlled without meds.    #Osteoporosis S: Patient was placed on Fosamax once a week in the past but current plan for treatment is parathyroidectomy in light of hyperparathyroidism/high calcium A/P:  parathyroid  biopsy- she is still pending results from this with Dr. GeHarlow AsaHad been referred by Dr. ShKelton Pillar No info until she gets [place]Hyperlipidemia/aortic atherosclerosis/prior CV S: Compliant with rosuvastatin 40 mg.  LDL goal at least under 100. Also takes asa -Aortic atherosclerosis noted CT 06/26/2017 for lung cancer screening.  Risk factor modification with blood pressure and lipid control plan. A/P: excellent control last check- continue current rx. Continue risk factor modification for aortic atherosclerosis Lab Results  Component Value Date   CHOL 153 05/22/2018   HDL 72 05/22/2018   LDLCALC 63 05/22/2018   LDLDIRECT 106 (H) 07/19/2016    TRIG 92 05/22/2018   CHOLHDL 2.1 05/22/2018   #Blood in the stool- noted blood with wiping. None in the stool. Symptoms for about a month intermittently.  -will get stool cards - "Pick up stool collection kit- if blood in stool we will need to get early colonoscopy. It sounds like this is a superficial irritation. Try wet wipes instead of toilet paper as this will likely be more gentle "  Recommended follow up: Return in about 4 months (around 04/15/2019) for hyperlipidemia, kidney disease, and HTN. Future Appointments  Date Time Provider De[place]2/04/2019  4:00 PM ScDonato HeinzMD [facility]  2:00 PM Shamleffer, IbMelanie CrazierMD [facility] None    Lab/Order associations:   ICD-10-CM   1. Gastroesophageal reflux disease without esophagitis  K21.9   2. Essential hypertension  I10   3. Stage 3a chronic kidney disease  N18.31   4. Hypercalcemia  E83.52   5. Hyperlipidemia, unspecified hyperlipidemia type  E78.5  6. Anxiety state  F41.1   7. Aortic atherosclerosis ([place])  I70.0    Return precautions advised.  [name], MD

## 2018-12-24 ENCOUNTER — Ambulatory Visit: Payer: Self-pay | Admitting: Surgery

## 2018-12-24 DIAGNOSIS — E210 Primary hyperparathyroidism: Secondary | ICD-10-CM | POA: Diagnosis not present

## 2018-12-24 DIAGNOSIS — E041 Nontoxic single thyroid nodule: Secondary | ICD-10-CM | POA: Diagnosis not present

## 2018-12-24 DIAGNOSIS — D440 Neoplasm of uncertain behavior of thyroid gland: Secondary | ICD-10-CM | POA: Diagnosis not present

## 2018-12-25 ENCOUNTER — Other Ambulatory Visit: Payer: Self-pay | Admitting: Family Medicine

## 2018-12-25 ENCOUNTER — Telehealth: Payer: Self-pay | Admitting: Internal Medicine

## 2018-12-25 NOTE — Telephone Encounter (Signed)
Attempted to contact pt after receiving a note from Dr. Harlow Asa about a left thyroid nodule biopsy with atypia , molecular testing ~ 50% risk of malignancy. Pt is scheduled for surgery.     Left a message to call back with any questions.     Abby Nena Jordan, MD  [facility]  [facility] [address] [place], [place] [address] Phone: [phone] FAX: [phone]

## 2018-12-31 ENCOUNTER — Other Ambulatory Visit: Payer: PPO

## 2019-01-02 DIAGNOSIS — E041 Nontoxic single thyroid nodule: Secondary | ICD-10-CM | POA: Insufficient documentation

## 2019-01-04 ENCOUNTER — Other Ambulatory Visit: Payer: PPO

## 2019-01-05 ENCOUNTER — Encounter (HOSPITAL_COMMUNITY): Payer: Self-pay

## 2019-01-05 ENCOUNTER — Other Ambulatory Visit: Payer: PPO

## 2019-01-05 LAB — FECAL OCCULT BLOOD, IMMUNOCHEMICAL: Fecal Occult Bld: NEGATIVE

## 2019-01-05 NOTE — Addendum Note (Signed)
Addended by: Francis Dowse T on: 01/05/2019 04:06 PM   Modules accepted: Orders

## 2019-01-05 NOTE — Addendum Note (Signed)
Addended by: Francis Dowse T on: 01/05/2019 04:07 PM   Modules accepted: Orders

## 2019-01-05 NOTE — Patient Instructions (Addendum)
DUE TO COVID-19 ONLY ONE VISITOR IS ALLOWED TO COME WITH YOU AND STAY IN THE WAITING ROOM ONLY DURING PRE OP AND PROCEDURE. THE ONE VISITOR MAY VISIT WITH YOU IN YOUR PRIVATE ROOM DURING VISITING HOURS ONLY!!   COVID SWAB TESTING MUST BE COMPLETED ON: Saturday, Jan. 2, 2021 at 12:05PM [address], [place] -[facility] enter pre surgical testing line (Must self quarantine after testing. Follow instructions on handout.)             Your procedure is scheduled on: Wednesday, Jan. 6, 2021   Report to [facility] Main  Entrance    Report to admitting at 6:30 AM   Call this number if you have problems the morning of surgery [phone]   Do not eat food or drink liquids :After Midnight.   Brush your teeth the morning of surgery.   Do NOT smoke after Midnight   Take these medicines the morning of surgery with A SIP OF WATER: Bupropion, Citalopram, Pantoprazole, Alprazolam if needed                               You may not have any metal on your body including hair pins, jewelry, and body piercings             Do not wear make-up, lotions, powders, perfumes/cologne, or deodorant             Do not wear nail polish.  Do not shave  48 hours prior to surgery.             Do not bring valuables to the hospital. [place].   Contacts, dentures or bridgework may not be worn into surgery.   Bring small overnight bag day of surgery.    Patients discharged the day of surgery will not be allowed to drive home   Special Instructions: Bring a copy of your healthcare power of attorney and living will documents         the day of surgery if you haven't scanned them in before.              Please read over the following fact sheets you were given:  [facility] - Preparing for Surgery Before surgery, you can play an important role.  Because skin is not sterile, your skin needs to be as free of germs as possible.   You can reduce the number of germs on your skin by washing with CHG (chlorahexidine gluconate) soap before surgery.  CHG is an antiseptic cleaner which kills germs and bonds with the skin to continue killing germs even after washing. Please DO NOT use if you have an allergy to CHG or antibacterial soaps.  If your skin becomes reddened/irritated stop using the CHG and inform your nurse when you arrive at Short Stay. Do not shave (including legs and underarms) for at least 48 hours prior to the first CHG shower.  You may shave your face/neck.  Please follow these instructions carefully:  1.  Shower with CHG Soap the night before surgery and the  morning of surgery.  2.  If you choose to wash your hair, wash your hair first as usual with your normal  shampoo.  3.  After you shampoo, rinse your hair and body thoroughly to remove the shampoo.  4.  Use CHG as you would any other liquid soap.  You can apply chg directly to the skin and wash.  Gently with a scrungie or clean washcloth.  5.  Apply the CHG Soap to your body ONLY FROM THE NECK DOWN.   Do   not use on face/ open                           Wound or open sores. Avoid contact with eyes, ears mouth and   genitals (private parts).                       Wash face,  Genitals (private parts) with your normal soap.             6.  Wash thoroughly, paying special attention to the area where your    surgery  will be performed.  7.  Thoroughly rinse your body with warm water from the neck down.  8.  DO NOT shower/wash with your normal soap after using and rinsing off the CHG Soap.                9.  Pat yourself dry with a clean towel.            10.  Wear clean pajamas.            11.  Place clean sheets on your bed the night of your first shower and do not  sleep with pets. Day of Surgery : Do not apply any lotions/deodorants the morning of surgery.  Please wear clean clothes to the hospital/surgery center.  FAILURE TO  FOLLOW THESE INSTRUCTIONS MAY RESULT IN THE CANCELLATION OF YOUR SURGERY  PATIENT SIGNATURE_________________________________  NURSE SIGNATURE__________________________________  ________________________________________________________________________

## 2019-01-05 NOTE — Addendum Note (Signed)
Addended by: Francis Dowse T on: 01/05/2019 04:31 PM   Modules accepted: Orders

## 2019-01-05 NOTE — Addendum Note (Signed)
Addended by: Francis Dowse T on: 01/05/2019 04:32 PM   Modules accepted: Orders

## 2019-01-06 ENCOUNTER — Encounter (HOSPITAL_COMMUNITY)
Admission: RE | Admit: 2019-01-06 | Discharge: 2019-01-06 | Disposition: A | Discharge status: Home / Self Care | Payer: PPO | Source: Ambulatory Visit | Attending: Surgery | Admitting: Surgery

## 2019-01-06 ENCOUNTER — Other Ambulatory Visit: Payer: Self-pay

## 2019-01-06 ENCOUNTER — Encounter (HOSPITAL_COMMUNITY): Payer: Self-pay

## 2019-01-06 ENCOUNTER — Ambulatory Visit (HOSPITAL_COMMUNITY)
Admission: RE | Admit: 2019-01-06 | Discharge: 2019-01-06 | Disposition: A | Discharge status: Home / Self Care | Payer: PPO | Source: Ambulatory Visit | Attending: Anesthesiology | Admitting: Anesthesiology

## 2019-01-06 DIAGNOSIS — Z01818 Encounter for other preprocedural examination: Secondary | ICD-10-CM | POA: Insufficient documentation

## 2019-01-06 HISTORY — DX: Atherosclerosis of aorta: I70.0

## 2019-01-06 HISTORY — DX: Unspecified osteoarthritis, unspecified site: M19.90

## 2019-01-06 HISTORY — DX: Personal history of transient ischemic attack (TIA), and cerebral infarction without residual deficits: Z86.73

## 2019-01-06 HISTORY — DX: Age-related osteoporosis without current pathological fracture: M81.0

## 2019-01-06 HISTORY — DX: Chronic kidney disease, stage 3 unspecified: N18.30

## 2019-01-06 HISTORY — DX: Hyperparathyroidism, unspecified: E21.3

## 2019-01-06 HISTORY — DX: Other specified cardiac arrhythmias: I49.8

## 2019-01-06 HISTORY — DX: Nontoxic single thyroid nodule: E04.1

## 2019-01-06 HISTORY — DX: Palpitations: R00.2

## 2019-01-06 LAB — BASIC METABOLIC PANEL
Anion gap: 8 (ref 5–15)
BUN: 16 mg/dL (ref 8–23)
CO2: 24 mmol/L (ref 22–32)
Calcium: 10.3 mg/dL (ref 8.9–10.3)
Chloride: 107 mmol/L (ref 98–111)
Creatinine, Ser: 1.33 mg/dL — ABNORMAL HIGH (ref 0.44–1.00)
GFR calc Af Amer: 46 mL/min — ABNORMAL LOW (ref >60)
GFR calc non Af Amer: 39 mL/min — ABNORMAL LOW (ref >60)
Glucose, Bld: 90 mg/dL (ref 70–99)
Potassium: 3.8 mmol/L (ref 3.5–5.1)
Sodium: 139 mmol/L (ref 135–145)

## 2019-01-06 LAB — CBC
HCT: 38.2 % (ref 36.0–46.0)
Hemoglobin: 12.6 g/dL (ref 12.0–15.0)
MCH: 29.3 pg (ref 26.0–34.0)
MCHC: 33 g/dL (ref 30.0–36.0)
MCV: 88.8 fL (ref 80.0–100.0)
Platelets: 208 10*3/uL (ref 150–400)
RBC: 4.3 MIL/uL (ref 3.87–5.11)
RDW: 13.4 % (ref 11.5–15.5)
WBC: 8.3 10*3/uL (ref 4.0–10.5)
nRBC: 0 % (ref 0.0–0.2)

## 2019-01-06 NOTE — Progress Notes (Signed)
PCP - Dr. Ansel Bong last office visit 12/15/2018 Cardiologist - Dr. Levada Dy  Chest x-ray - 01/06/2019 in epic EKG - 11/11/2018 in epic Stress Test - N/A ECHO - 11/24/2018 in epic Cardiac Cath - N/A  Sleep Study - N/A CPAP - N/A  Fasting Blood Sugar - N/A Checks Blood Sugar _N/A____ times a day  Blood Thinner Instructions: N/A Aspirin Instructions: Patient stated she has not been instructed to stop aspirin at this tiem Last Dose: still currently taking  Anesthesia review: CKD III, HTN  Patient denies shortness of breath, fever, cough and chest pain at PAT appointment   Patient verbalized understanding of instructions that were given to them at the PAT appointment. Patient was also instructed that they will need to review over the PAT instructions again at home before surgery.

## 2019-01-09 ENCOUNTER — Other Ambulatory Visit (HOSPITAL_COMMUNITY)
Admission: RE | Admit: 2019-01-09 | Discharge: 2019-01-09 | Disposition: A | Discharge status: Home / Self Care | Payer: PPO | Source: Ambulatory Visit | Attending: Surgery | Admitting: Surgery

## 2019-01-09 DIAGNOSIS — Z01812 Encounter for preprocedural laboratory examination: Secondary | ICD-10-CM | POA: Insufficient documentation

## 2019-01-09 DIAGNOSIS — Z20822 Contact with and (suspected) exposure to covid-19: Secondary | ICD-10-CM | POA: Insufficient documentation

## 2019-01-11 LAB — NOVEL CORONAVIRUS, NAA (HOSP ORDER, SEND-OUT TO REF LAB; TAT 18-24 HRS): SARS-CoV-2, NAA: NOT DETECTED

## 2019-01-12 ENCOUNTER — Encounter (HOSPITAL_COMMUNITY): Payer: Self-pay | Admitting: Surgery

## 2019-01-12 DIAGNOSIS — D440 Neoplasm of uncertain behavior of thyroid gland: Secondary | ICD-10-CM | POA: Diagnosis present

## 2019-01-12 NOTE — Anesthesia Preprocedure Evaluation (Addendum)
Anesthesia Evaluation  Patient identified by MRN, date of birth, ID band Patient awake    Reviewed: Allergy & Precautions, NPO status , Patient's Chart, lab work & pertinent test results  Airway Mallampati: II  TM Distance: >3 FB Neck ROM: Full    Dental  (+) Dental Advisory Given, Missing, Caps   Pulmonary former smoker,    Pulmonary exam normal breath sounds clear to auscultation       Cardiovascular hypertension, Pt. on medications + Peripheral Vascular Disease  Normal cardiovascular exam+ Valvular Problems/Murmurs AS  Rhythm:Regular Rate:Normal     Neuro/Psych PSYCHIATRIC DISORDERS Anxiety  Neuromuscular disease    GI/Hepatic Neg liver ROS, GERD  Medicated,  Endo/Other  Hyperparathyroidism Thyroid nodule  Renal/GU Renal InsufficiencyRenal disease     Musculoskeletal  (+) Arthritis , Osteoarthritis,    Abdominal   Peds  Hematology negative hematology ROS (+)   Anesthesia Other Findings Day of surgery medications reviewed with the patient.  Reproductive/Obstetrics                           Anesthesia Physical Anesthesia Plan  ASA: III  Anesthesia Plan: General   Post-op Pain Management:    Induction: Intravenous  PONV Risk Score and Plan: 3 and Dexamethasone, Ondansetron and Treatment may vary due to age or medical condition  Airway Management Planned: Oral ETT and Video Laryngoscope Planned  Additional Equipment:   Intra-op Plan:   Post-operative Plan: Extubation in OR  Informed Consent: I have reviewed the patients History and Physical, chart, labs and discussed the procedure including the risks, benefits and alternatives for the proposed anesthesia with the patient or authorized representative who has indicated his/her understanding and acceptance.     Dental advisory given  Plan Discussed with: CRNA  Anesthesia Plan Comments:       Anesthesia Quick  Evaluation

## 2019-01-12 NOTE — H&P (Signed)
General Surgery [facility]  Crystal Carroll DOB: 18-Oct-1945 Married / Language: English / Race: White Female   History of Present Illness   The patient is a 74 year old female who presents with a thyroid nodule.  CHIEF COMPLAINT: left thyroid neoplasm of uncertain behavior, primary hyperparathyroidism  Patient returns to discuss results of diagnostic testing. Patient was undergoing evaluation for primary hyperparathyroidism. This included a nuclear medicine parathyroid scan which didn't localize a left inferior parathyroid adenoma. In addition, the patient underwent an ultrasound examination of the neck which identified a 2 cm left thyroid nodule. This was felt to be suspicious by ultrasound criteria and biopsy was recommended. Patient underwent fine-needle aspiration biopsy demonstrating atypia. Subsequent molecular genetic testing was performed and returned result of suspicious, rendering a 50% chance of malignancy. Patient now returns to review these results and discuss options for surgical intervention. The patient has had previous surgery through the anterior cervical area for spinal fusion. She has not had previous thyroid surgery.   Medication History  ALPRAZolam (0.25MG  Tablet, Oral) Active. buPROPion HCl ER (XL) (300MG  Tablet ER 24HR, Oral) Active. Citalopram Hydrobromide (20MG  Tablet, Oral) Active. Pantoprazole Sodium (20MG  Tablet DR, Oral) Active. Rosuvastatin Calcium (40MG  Tablet, Oral) Active. Aspirin (81MG  Tablet, Oral) Active. Turmeric (500MG  Capsule, Oral) Active. Medications Reconciled  Vitals  Weight: 146 lb Height: 60in Body Surface Area: 1.63 m Body Mass Index: 28.51 kg/m  Temp.: 98.44F  Pulse: 70 (Regular)  P.OX: 96% (Room air) BP: 118/70 (Sitting, Left Arm, Standard)  Physical Exam The physical exam findings are as follows: Note:GENERAL APPEARANCE Development: normal Nutritional status: normal Gross  deformities: none  SKIN Rash, lesions, ulcers: none Induration, erythema: none Nodules: none palpable  EYES Conjunctiva and lids: normal Pupils: equal and reactive Iris: normal bilaterally  EARS, NOSE, MOUTH, THROAT External ears: no lesion or deformity External nose: no lesion or deformity Hearing: grossly normal Patient is wearing a mask.  NECK Symmetric: yes Trachea: midline Thyroid: no palpable nodules in the thyroid bed Well-healed anterior cervical incision. Limited extension of the neck.  CHEST Respiratory effort: normal Retraction or accessory muscle use: no Breath sounds: normal bilaterally Rales, rhonchi, wheeze: none  CARDIOVASCULAR Auscultation: regular rhythm, normal rate Murmurs: none Pulses: carotid and radial pulse 2+ palpable Lower extremity edema: none Lower extremity varicosities: none  MUSCULOSKELETAL Station and gait: normal Digits and nails: no clubbing or cyanosis Muscle strength: grossly normal all extremities Range of motion: grossly normal all extremities Deformity: none  LYMPHATIC Cervical: none palpable Supraclavicular: none palpable  PSYCHIATRIC Oriented to person, place, and time: yes Mood and affect: normal for situation Judgment and insight: appropriate for situation    Assessment & Plan  PRIMARY HYPERPARATHYROIDISM (E21.0) THYROID NODULE (E04.1) NEOPLASM OF UNCERTAIN BEHAVIOR OF THYROID GLAND (D44.0)  Patient returns today to discuss the results of her diagnostic studies. She is provided with copies of these reports.  Evaluation for primary hyperparathyroidism included an ultrasound examination of the neck as well as a nuclear medicine parathyroid scan. This localized a left inferior parathyroid adenoma. However, the ultrasound also noted a 2 cm left thyroid nodule. Biopsy was performed and showed atypia. Molecular genetic testing was suspicious for malignancy, rendering approximately a 50% chance of cancer.  We  discussed options for management. I have recommended proceeding with neck exploration, left thyroid lobectomy, and left parathyroidectomy. We discussed the risk and benefits of these procedures. We discussed the risk of recurrent laryngeal nerve injury. We discussed the possibility of additional surgery  being necessary in the event of malignancy. We discussed possibility of radioactive iodine treatment. We discussed the hospital stay to be anticipated. Patient understands and wishes to proceed with surgery in the near future.  The risks and benefits of the procedure have been discussed at length with the patient. The patient understands the proposed procedure, potential alternative treatments, and the course of recovery to be expected. All of the patient's questions have been answered at this time. The patient wishes to proceed with surgery.   Armandina Gemma, MD [facility] Office: [phone]

## 2019-01-13 ENCOUNTER — Ambulatory Visit (HOSPITAL_COMMUNITY): Payer: PPO | Admitting: Anesthesiology

## 2019-01-13 ENCOUNTER — Observation Stay (HOSPITAL_COMMUNITY)
Admission: RE | Admit: 2019-01-13 | Discharge: 2019-01-14 | Disposition: A | Discharge status: Home / Self Care | Payer: PPO | Source: Other Acute Inpatient Hospital | Attending: Surgery | Admitting: Surgery

## 2019-01-13 ENCOUNTER — Encounter (HOSPITAL_COMMUNITY)
Admission: RE | Disposition: A | Discharge status: Home / Self Care | Payer: Self-pay | Source: Other Acute Inpatient Hospital | Attending: Surgery

## 2019-01-13 ENCOUNTER — Encounter (HOSPITAL_COMMUNITY): Payer: Self-pay | Admitting: Surgery

## 2019-01-13 ENCOUNTER — Ambulatory Visit (HOSPITAL_COMMUNITY): Payer: PPO | Admitting: Physician Assistant

## 2019-01-13 ENCOUNTER — Other Ambulatory Visit: Payer: Self-pay

## 2019-01-13 DIAGNOSIS — Z87891 Personal history of nicotine dependence: Secondary | ICD-10-CM | POA: Diagnosis not present

## 2019-01-13 DIAGNOSIS — D440 Neoplasm of uncertain behavior of thyroid gland: Principal | ICD-10-CM | POA: Diagnosis present

## 2019-01-13 DIAGNOSIS — M1990 Unspecified osteoarthritis, unspecified site: Secondary | ICD-10-CM | POA: Diagnosis not present

## 2019-01-13 DIAGNOSIS — I739 Peripheral vascular disease, unspecified: Secondary | ICD-10-CM | POA: Diagnosis not present

## 2019-01-13 DIAGNOSIS — Z981 Arthrodesis status: Secondary | ICD-10-CM | POA: Insufficient documentation

## 2019-01-13 DIAGNOSIS — E785 Hyperlipidemia, unspecified: Secondary | ICD-10-CM | POA: Diagnosis not present

## 2019-01-13 DIAGNOSIS — I10 Essential (primary) hypertension: Secondary | ICD-10-CM | POA: Insufficient documentation

## 2019-01-13 DIAGNOSIS — K219 Gastro-esophageal reflux disease without esophagitis: Secondary | ICD-10-CM | POA: Insufficient documentation

## 2019-01-13 DIAGNOSIS — F419 Anxiety disorder, unspecified: Secondary | ICD-10-CM | POA: Diagnosis not present

## 2019-01-13 DIAGNOSIS — Z79899 Other long term (current) drug therapy: Secondary | ICD-10-CM | POA: Diagnosis not present

## 2019-01-13 DIAGNOSIS — E210 Primary hyperparathyroidism: Secondary | ICD-10-CM | POA: Diagnosis present

## 2019-01-13 DIAGNOSIS — D34 Benign neoplasm of thyroid gland: Secondary | ICD-10-CM | POA: Diagnosis not present

## 2019-01-13 HISTORY — PX: THYROID LOBECTOMY: SHX420

## 2019-01-13 HISTORY — PX: PARATHYROIDECTOMY: SHX19

## 2019-01-13 SURGERY — LOBECTOMY, THYROID
Anesthesia: General | Laterality: Left

## 2019-01-13 MED ORDER — TRAMADOL HCL 50 MG PO TABS: 50.0000 mg | ORAL_TABLET | Freq: Four times a day (QID) | ORAL | Status: DC | PRN

## 2019-01-13 MED ORDER — FENTANYL CITRATE (PF) 100 MCG/2ML IJ SOLN
INTRAMUSCULAR | Status: AC
  Filled 2019-01-13: qty 2

## 2019-01-13 MED ORDER — ACETAMINOPHEN 650 MG RE SUPP: 650.0000 mg | Freq: Four times a day (QID) | RECTAL | Status: DC | PRN

## 2019-01-13 MED ORDER — LISINOPRIL 20 MG PO TABS
20.0000 mg | ORAL_TABLET | Freq: Every day | ORAL | Status: DC
  Administered 2019-01-13: 18:00:00 20 mg via ORAL
  Filled 2019-01-13 (×2): qty 1

## 2019-01-13 MED ORDER — CHLORHEXIDINE GLUCONATE CLOTH 2 % EX PADS: 6.0000 | MEDICATED_PAD | Freq: Once | CUTANEOUS | Status: DC

## 2019-01-13 MED ORDER — LIDOCAINE 2% (20 MG/ML) 5 ML SYRINGE
INTRAMUSCULAR | Status: AC
  Filled 2019-01-13: qty 10

## 2019-01-13 MED ORDER — PHENYLEPHRINE 40 MCG/ML (10ML) SYRINGE FOR IV PUSH (FOR BLOOD PRESSURE SUPPORT)
PREFILLED_SYRINGE | INTRAVENOUS | Status: DC | PRN
  Administered 2019-01-13: 80 ug via INTRAVENOUS

## 2019-01-13 MED ORDER — DEXAMETHASONE SODIUM PHOSPHATE 10 MG/ML IJ SOLN
INTRAMUSCULAR | Status: AC
  Filled 2019-01-13: qty 2

## 2019-01-13 MED ORDER — PROPOFOL 10 MG/ML IV BOLUS
INTRAVENOUS | Status: AC
  Filled 2019-01-13: qty 40

## 2019-01-13 MED ORDER — 0.9 % SODIUM CHLORIDE (POUR BTL) OPTIME
TOPICAL | Status: DC | PRN
  Administered 2019-01-13: 1000 mL

## 2019-01-13 MED ORDER — PROPOFOL 10 MG/ML IV BOLUS
INTRAVENOUS | Status: DC | PRN
  Administered 2019-01-13: 130 mg via INTRAVENOUS

## 2019-01-13 MED ORDER — HYDROMORPHONE HCL 1 MG/ML IJ SOLN: 1.0000 mg | INTRAMUSCULAR | Status: DC | PRN

## 2019-01-13 MED ORDER — ACETAMINOPHEN 325 MG PO TABS
650.0000 mg | ORAL_TABLET | Freq: Four times a day (QID) | ORAL | Status: DC | PRN
  Administered 2019-01-13: 22:00:00 650 mg via ORAL
  Filled 2019-01-13 (×2): qty 2

## 2019-01-13 MED ORDER — LACTATED RINGERS IV SOLN: INTRAVENOUS | Status: DC

## 2019-01-13 MED ORDER — EPHEDRINE SULFATE 50 MG/ML IJ SOLN
INTRAMUSCULAR | Status: DC | PRN
  Administered 2019-01-13: 10 mg via INTRAVENOUS
  Administered 2019-01-13: 5 mg via INTRAVENOUS

## 2019-01-13 MED ORDER — ALPRAZOLAM 0.25 MG PO TABS: 0.2500 mg | ORAL_TABLET | Freq: Every day | ORAL | Status: DC | PRN

## 2019-01-13 MED ORDER — BUPROPION HCL ER (XL) 300 MG PO TB24
300.0000 mg | ORAL_TABLET | Freq: Every day | ORAL | Status: DC
  Administered 2019-01-14: 300 mg via ORAL
  Filled 2019-01-13 (×2): qty 1

## 2019-01-13 MED ORDER — ONDANSETRON HCL 4 MG/2ML IJ SOLN: 4.0000 mg | Freq: Once | INTRAMUSCULAR | Status: DC | PRN

## 2019-01-13 MED ORDER — FENTANYL CITRATE (PF) 100 MCG/2ML IJ SOLN
INTRAMUSCULAR | Status: AC
  Administered 2019-01-13: 11:00:00 50 ug via INTRAVENOUS
  Filled 2019-01-13: qty 2

## 2019-01-13 MED ORDER — PANTOPRAZOLE SODIUM 20 MG PO TBEC
20.0000 mg | DELAYED_RELEASE_TABLET | Freq: Every day | ORAL | Status: DC
  Administered 2019-01-14: 20 mg via ORAL
  Filled 2019-01-13 (×3): qty 1

## 2019-01-13 MED ORDER — EPHEDRINE 5 MG/ML INJ
INTRAVENOUS | Status: AC
  Filled 2019-01-13: qty 10

## 2019-01-13 MED ORDER — LIDOCAINE 2% (20 MG/ML) 5 ML SYRINGE
INTRAMUSCULAR | Status: DC | PRN
  Administered 2019-01-13: 70 mg via INTRAVENOUS

## 2019-01-13 MED ORDER — ONDANSETRON HCL 4 MG/2ML IJ SOLN
INTRAMUSCULAR | Status: AC
  Filled 2019-01-13: qty 4

## 2019-01-13 MED ORDER — ROCURONIUM BROMIDE 10 MG/ML (PF) SYRINGE
PREFILLED_SYRINGE | INTRAVENOUS | Status: DC | PRN
  Administered 2019-01-13: 10 mg via INTRAVENOUS
  Administered 2019-01-13: 50 mg via INTRAVENOUS

## 2019-01-13 MED ORDER — ONDANSETRON 4 MG PO TBDP
4.0000 mg | ORAL_TABLET | Freq: Four times a day (QID) | ORAL | Status: DC | PRN
  Filled 2019-01-13: qty 1

## 2019-01-13 MED ORDER — ONDANSETRON HCL 4 MG/2ML IJ SOLN
INTRAMUSCULAR | Status: DC | PRN
  Administered 2019-01-13 (×2): 4 mg via INTRAVENOUS

## 2019-01-13 MED ORDER — FENTANYL CITRATE (PF) 100 MCG/2ML IJ SOLN
25.0000 ug | INTRAMUSCULAR | Status: DC | PRN
  Administered 2019-01-13: 11:00:00 50 ug via INTRAVENOUS

## 2019-01-13 MED ORDER — ONDANSETRON HCL 4 MG/2ML IJ SOLN: 4.0000 mg | Freq: Four times a day (QID) | INTRAMUSCULAR | Status: DC | PRN

## 2019-01-13 MED ORDER — MIDAZOLAM HCL 2 MG/2ML IJ SOLN
INTRAMUSCULAR | Status: AC
  Filled 2019-01-13: qty 2

## 2019-01-13 MED ORDER — BUPIVACAINE HCL (PF) 0.25 % IJ SOLN
INTRAMUSCULAR | Status: AC
  Filled 2019-01-13: qty 30

## 2019-01-13 MED ORDER — CEFAZOLIN SODIUM-DEXTROSE 2-4 GM/100ML-% IV SOLN
2.0000 g | INTRAVENOUS | Status: AC
  Administered 2019-01-13: 2 g via INTRAVENOUS
  Filled 2019-01-13: qty 100

## 2019-01-13 MED ORDER — FENTANYL CITRATE (PF) 100 MCG/2ML IJ SOLN
INTRAMUSCULAR | Status: DC | PRN
  Administered 2019-01-13: 75 ug via INTRAVENOUS

## 2019-01-13 MED ORDER — DEXAMETHASONE SODIUM PHOSPHATE 10 MG/ML IJ SOLN
INTRAMUSCULAR | Status: DC | PRN
  Administered 2019-01-13: 10 mg via INTRAVENOUS

## 2019-01-13 MED ORDER — SUGAMMADEX SODIUM 200 MG/2ML IV SOLN
INTRAVENOUS | Status: DC | PRN
  Administered 2019-01-13: 200 mg via INTRAVENOUS

## 2019-01-13 MED ORDER — ROCURONIUM BROMIDE 10 MG/ML (PF) SYRINGE
PREFILLED_SYRINGE | INTRAVENOUS | Status: AC
  Filled 2019-01-13: qty 10

## 2019-01-13 MED ORDER — ACETAMINOPHEN 500 MG PO TABS
1000.0000 mg | ORAL_TABLET | Freq: Once | ORAL | Status: AC
  Administered 2019-01-13: 07:00:00 1000 mg via ORAL
  Filled 2019-01-13: qty 2

## 2019-01-13 MED ORDER — KCL IN DEXTROSE-NACL 20-5-0.45 MEQ/L-%-% IV SOLN
INTRAVENOUS | Status: DC
  Filled 2019-01-13: qty 1000

## 2019-01-13 MED ORDER — CITALOPRAM HYDROBROMIDE 20 MG PO TABS
20.0000 mg | ORAL_TABLET | Freq: Every day | ORAL | Status: DC
  Administered 2019-01-14: 20 mg via ORAL
  Filled 2019-01-13 (×2): qty 1

## 2019-01-13 MED ORDER — OXYCODONE HCL 5 MG PO TABS: 5.0000 mg | ORAL_TABLET | ORAL | Status: DC | PRN

## 2019-01-13 MED ORDER — PHENYLEPHRINE 40 MCG/ML (10ML) SYRINGE FOR IV PUSH (FOR BLOOD PRESSURE SUPPORT)
PREFILLED_SYRINGE | INTRAVENOUS | Status: AC
  Filled 2019-01-13: qty 10

## 2019-01-13 SURGICAL SUPPLY — 35 items
ADH SKN CLS APL DERMABOND .7 (GAUZE/BANDAGES/DRESSINGS) ×1
APL PRP STRL LF DISP 70% ISPRP (MISCELLANEOUS) ×2
ATTRACTOMAT 16X20 MAGNETIC DRP (DRAPES) ×3 IMPLANT
BLADE SURG 15 STRL LF DISP TIS (BLADE) ×1 IMPLANT
BLADE SURG 15 STRL SS (BLADE) ×3
CHLORAPREP W/TINT 26 (MISCELLANEOUS) ×6 IMPLANT
CLIP VESOCCLUDE MED 6/CT (CLIP) ×6 IMPLANT
CLIP VESOCCLUDE SM WIDE 6/CT (CLIP) ×8 IMPLANT
COVER SURGICAL LIGHT HANDLE (MISCELLANEOUS) ×3 IMPLANT
COVER WAND RF STERILE (DRAPES) ×3 IMPLANT
DERMABOND ADVANCED (GAUZE/BANDAGES/DRESSINGS) ×2
DERMABOND ADVANCED .7 DNX12 (GAUZE/BANDAGES/DRESSINGS) IMPLANT
DRAPE LAPAROTOMY T 98X78 PEDS (DRAPES) ×3 IMPLANT
ELECT BLADE TIP CTD 4 INCH (ELECTRODE) ×2 IMPLANT
ELECT REM PT RETURN 15FT ADLT (MISCELLANEOUS) ×3 IMPLANT
GAUZE 4X4 16PLY RFD (DISPOSABLE) ×3 IMPLANT
GLOVE SURG ORTHO 8.0 STRL STRW (GLOVE) ×3 IMPLANT
GOWN STRL REUS W/TWL XL LVL3 (GOWN DISPOSABLE) ×9 IMPLANT
HEMOSTAT SURGICEL 2X4 FIBR (HEMOSTASIS) IMPLANT
ILLUMINATOR WAVEGUIDE N/F (MISCELLANEOUS) IMPLANT
KIT BASIN OR (CUSTOM PROCEDURE TRAY) ×3 IMPLANT
KIT TURNOVER KIT A (KITS) IMPLANT
NDL HYPO 25X1 1.5 SAFETY (NEEDLE) ×1 IMPLANT
NEEDLE HYPO 25X1 1.5 SAFETY (NEEDLE) ×3 IMPLANT
PACK BASIC VI WITH GOWN DISP (CUSTOM PROCEDURE TRAY) ×3 IMPLANT
PENCIL SMOKE EVACUATOR (MISCELLANEOUS) IMPLANT
SHEARS HARMONIC 9CM CVD (BLADE) ×3 IMPLANT
SUT MNCRL AB 4-0 PS2 18 (SUTURE) ×3 IMPLANT
SUT VIC AB 3-0 SH 18 (SUTURE) ×6 IMPLANT
SYR BULB IRRIGATION 50ML (SYRINGE) ×3 IMPLANT
SYR CONTROL 10ML LL (SYRINGE) ×3 IMPLANT
TOWEL OR 17X26 10 PK STRL BLUE (TOWEL DISPOSABLE) ×3 IMPLANT
TOWEL OR NON WOVEN STRL DISP B (DISPOSABLE) ×3 IMPLANT
TUBING CONNECTING 10 (TUBING) ×2 IMPLANT
TUBING CONNECTING 10' (TUBING) ×1

## 2019-01-13 NOTE — Anesthesia Postprocedure Evaluation (Signed)
Anesthesia Post Note  Patient: Crystal Carroll  Procedure(s) Performed: LEFT THYROID LOBECTOMY (Left ) LEFT PARATHYROIDECTOMY (Left )     Patient location during evaluation: PACU Anesthesia Type: General Level of consciousness: awake and alert Pain management: pain level controlled Vital Signs Assessment: post-procedure vital signs reviewed and stable Respiratory status: spontaneous breathing, nonlabored ventilation and respiratory function stable Cardiovascular status: blood pressure returned to baseline and stable Postop Assessment: no apparent nausea or vomiting Anesthetic complications: no    Last Vitals:  Vitals:   01/13/19 1500 01/13/19 1820  BP: 119/70 127/60  Pulse: 79 77  Resp: 16 18  Temp: 36.6 C 36.8 C  SpO2: 98% 97%    Last Pain:  Vitals:   01/13/19 1820  TempSrc: Oral  PainSc:                  Catalina Gravel

## 2019-01-13 NOTE — Anesthesia Procedure Notes (Signed)
Procedure Name: Intubation Date/Time: 01/13/2019 8:40 AM Performed by: Lavina Hamman, CRNA Pre-anesthesia Checklist: Patient identified, Emergency Drugs available, Suction available, Patient being monitored and Timeout performed Patient Re-evaluated:Patient Re-evaluated prior to induction Oxygen Delivery Method: Circle system utilized Preoxygenation: Pre-oxygenation with 100% oxygen Induction Type: IV induction Ventilation: Mask ventilation without difficulty Laryngoscope Size: Glidescope and 3 Grade View: Grade I Tube type: Parker flex tip Tube size: 7.0 mm Number of attempts: 1 Airway Equipment and Method: Video-laryngoscopy Placement Confirmation: ETT inserted through vocal cords under direct vision,  positive ETCO2,  CO2 detector and breath sounds checked- equal and bilateral Secured at: 22 cm Tube secured with: Tape Dental Injury: Teeth and Oropharynx as per pre-operative assessment  Difficulty Due To: Difficult Airway- due to reduced neck mobility Comments: Easy intubation using glidescope. Easy mask ventilation.

## 2019-01-13 NOTE — Transfer of Care (Signed)
Immediate Anesthesia Transfer of Care Note  Patient: Crystal Carroll  Procedure(s) Performed: LEFT THYROID LOBECTOMY (Left ) LEFT PARATHYROIDECTOMY (Left )  Patient Location: PACU  Anesthesia Type:General  Level of Consciousness: awake, alert , drowsy and patient cooperative  Airway & Oxygen Therapy: Patient Spontanous Breathing and Patient connected to face mask oxygen  Post-op Assessment: Post -op Vital signs reviewed and stable and Patient moving all extremities  Post vital signs: Reviewed  Last Vitals:  Vitals Value Taken Time  BP 103/92 01/13/19 1021  Temp    Pulse 76 01/13/19 1024  Resp    SpO2 100 % 01/13/19 1024  Vitals shown include unvalidated device data.  Last Pain:  Vitals:   01/13/19 0650  TempSrc: Oral  PainSc: 0-No pain         Complications: No apparent anesthesia complications

## 2019-01-13 NOTE — Op Note (Signed)
Procedure Note  Pre-operative Diagnosis:  Thyroid neoplasm of uncertain behavior, primary hyperparathyroidism  Post-operative Diagnosis:  same  Surgeon:  Armandina Gemma, MD  Assistant:  none   Procedure:  Left thyroid lobectomy, left inferior parathyroidectomy  Anesthesia:  General  Estimated Blood Loss:  minimal  Drains: none         Specimen: thyroid lobe to pathology; left inferior parathyroid to pathology  Indications:  Patient was undergoing evaluation for primary hyperparathyroidism. This included a nuclear medicine parathyroid scan which didn't localize a left inferior parathyroid adenoma. In addition, the patient underwent an ultrasound examination of the neck which identified a 2 cm left thyroid nodule. This was felt to be suspicious by ultrasound criteria and biopsy was recommended. Patient underwent fine-needle aspiration biopsy demonstrating atypia. Subsequent molecular genetic testing was performed and returned result of suspicious, rendering a 50% chance of malignancy. Patient now returns to review these results and discuss options for surgical intervention. The patient has had previous surgery through the anterior cervical area for spinal fusion. She has not had previous thyroid surgery.  Procedure Details: Procedure was done in OR #1 at the [facility]. The patient was brought to the operating room and placed in a supine position on the operating room table. Following administration of general anesthesia, the patient was positioned and then prepped and draped in the usual aseptic fashion. After ascertaining that an adequate level of anesthesia had been achieved, a small Kocher incision was made with #15 blade. Dissection was carried through subcutaneous tissues and platysma. Hemostasis was achieved with the electrocautery. Skin flaps were elevated cephalad and caudad from the thyroid notch to the sternal notch. A self-retaining retractor was placed for exposure.  Strap muscles were incised in the midline and dissection was begun on the left side. Strap muscles were reflected laterally. The left thyroid lobe was slightly enlarged with a palpable nodule in the inferior pole. The lobe was gently mobilized with blunt dissection. Superior pole vessels were dissected out and divided individually between small and medium ligaclips with the harmonic scalpel. The thyroid lobe was rolled anteriorly. Branches of the inferior thyroid artery were divided between small ligaclips with the harmonic scalpel. Inferior venous tributaries were divided between ligaclips. The recurrent laryngeal nerve was identified and preserved along its course. The ligament of Gwenlyn Found was released with the electrocautery and the gland was mobilized onto the anterior trachea. Isthmus was mobilized across the midline. There was no pyramidal lobe present. The thyroid parenchyma was transected at the junction of the isthmus and contralateral thyroid lobe with the harmonic scalpel. The thyroid lobe and isthmus were submitted to pathology for review.  Further exploration on the left side of the neck showed significant scar tissue laterally and posterior to the esophagus consistent with previous spine fusion.  Exploration in the superior position fails to identify any evidence of parathyroid tissue.  Dissection inferiorly along the lateral edge of the esophagus reveals an enlarged parathyroid gland.  This is gently dissected out.  Vascular structures are divided with the harmonic scalpel between small ligaclips and the gland is excised.  This is submitted to pathology.  Dr. Vicente Males did a frozen section biopsy confirming hypercellular parathyroid tissue consistent with parathyroid adenoma.  The neck was irrigated with warm saline. Fibrillar was placed throughout the operative field. Strap muscles were approximated in the midline with interrupted 3-0 Vicryl sutures. Platysma was closed with interrupted 3-0 Vicryl  sutures. Skin was closed with a running 4-0 Monocryl subcuticular suture.  Wound was washed and dried and Dermabond was applied. The patient was awakened from anesthesia and brought to the recovery room. The patient tolerated the procedure well.   Armandina Gemma, MD [facility] Office: [phone]

## 2019-01-14 DIAGNOSIS — D440 Neoplasm of uncertain behavior of thyroid gland: Secondary | ICD-10-CM | POA: Diagnosis not present

## 2019-01-14 LAB — BASIC METABOLIC PANEL
Anion gap: 7 (ref 5–15)
BUN: 17 mg/dL (ref 8–23)
CO2: 23 mmol/L (ref 22–32)
Calcium: 9.5 mg/dL (ref 8.9–10.3)
Chloride: 108 mmol/L (ref 98–111)
Creatinine, Ser: 1.19 mg/dL — ABNORMAL HIGH (ref 0.44–1.00)
GFR calc Af Amer: 52 mL/min — ABNORMAL LOW (ref >60)
GFR calc non Af Amer: 45 mL/min — ABNORMAL LOW (ref >60)
Glucose, Bld: 135 mg/dL — ABNORMAL HIGH (ref 70–99)
Potassium: 3.8 mmol/L (ref 3.5–5.1)
Sodium: 138 mmol/L (ref 135–145)

## 2019-01-14 NOTE — TOC Initial Note (Signed)
Transition of Care [facility]) - Initial/Assessment Note    Patient Details  Name: Crystal Carroll MRN: [id number] Date of Birth: 1945-12-06  Transition [facility]) CM/SW Contact:    Leeroy Cha, RN Phone Number: 01/14/2019, 11:18 AM  Clinical Narrative:                 dcd to home with no needs  Expected Discharge Plan: Home/Self Care Barriers to Discharge: No Barriers Identified   Patient Goals and CMS Choice        Expected Discharge Plan and Services Expected Discharge Plan: Home/Self Care   Discharge Planning Services: CM Consult   Living arrangements for the past 2 months: Single Family Home Expected Discharge Date: 01/14/19                                    Prior Living Arrangements/Services Living arrangements for the past 2 months: Single Family Home Lives with:: Spouse Patient language and need for interpreter reviewed:: No Do you feel safe going back to the place where you live?: Yes      Need for Family Participation in Patient Care: Yes (Comment) Care giver support system in place?: Yes (comment)   Criminal Activity/Legal Involvement Pertinent to Current Situation/Hospitalization: No - Comment as needed  Activities of Daily Living Home Assistive Devices/Equipment: Cane (specify quad or straight), Walker (specify type) ADL Screening (condition at time of admission) Patient's cognitive ability adequate to safely complete daily activities?: Yes Is the patient deaf or have difficulty hearing?: No Does the patient have difficulty seeing, even when wearing glasses/contacts?: No Does the patient have difficulty concentrating, remembering, or making decisions?: No Patient able to express need for assistance with ADLs?: Yes Does the patient have difficulty dressing or bathing?: No Independently performs ADLs?: Yes (appropriate for developmental age) Does the patient have difficulty walking or climbing stairs?: No Weakness of Legs: None Weakness of  Arms/Hands: None  Permission Sought/Granted                  Emotional Assessment       Orientation: : Oriented to Self, Oriented to Place, Oriented to  Time, Oriented to Situation Alcohol / Substance Use: Not Applicable Psych Involvement: No (comment)  Admission diagnosis:  Neoplasm of uncertain behavior of thyroid gland [D44.0] Patient Active Problem List   Diagnosis Date Noted  . Neoplasm of uncertain behavior of thyroid gland 01/12/2019  . Thyroid nodule 01/02/2019  . Menopausal flushing 12/15/2018  . Pruritus of vagina 12/15/2018  . Hyperparathyroidism, primary ([place]) 07/17/2018  . History of lacunar cerebrovascular accident (CVA) 06/18/2018  . Osteoporosis 03/13/2018  . Patellofemoral arthritis 03/05/2018  . DDD (degenerative disc disease), lumbar 03/05/2018  . Unstable gait 01/20/2018  . Aortic atherosclerosis ([place]) 06/12/2017  . Palpitations 05/12/2017  . Vitamin B12 deficiency 05/12/2017  . Cervical radiculopathy 04/03/2016  . Caregiver burden 04/03/2016  . Essential tremor 04/18/2015  . Adenomatous colon polyp 06/14/2014  . Diarrhea 04/14/2014  . Heavy alcohol use 04/14/2014  . H/O cold sores 10/15/2013  . CKD (chronic kidney disease), stage III 10/15/2013  . Hypercalcemia 01/28/2007  . TOBACCO USE 12/24/2006  . Hyperlipidemia 05/26/2006  . Anxiety state 05/26/2006  . Essential hypertension 05/26/2006  . GERD 05/26/2006   PCP:  Marin Olp, MD Pharmacy:   [place], [place] [address]  Phone: [phone] Fax: [phone]     Social Determinants of Health (SDOH) Interventions    Readmission Risk Interventions No flowsheet data found.

## 2019-01-14 NOTE — Progress Notes (Signed)
D/C instructions given to patient. Patient had no questions. NT or writer will wheel patient out once her rides get here  

## 2019-01-14 NOTE — Discharge Summary (Signed)
Physician Discharge Summary [facility]  Patient ID: Crystal Carroll MRN: [id number] DOB/AGE: 1945/12/29 74 y.o.  Admit date: 01/13/2019 Discharge date: 01/14/2019  Admission Diagnoses:  Primary hyperparathyroidism, thyroid neoplasm of uncertain behavior  Discharge Diagnoses:  Principal Problem:   Neoplasm of uncertain behavior of thyroid gland Active Problems:   Hyperparathyroidism, primary [facility])   Discharged Condition: good  Hospital Course: Patient was admitted for observation following thyroid and parathyroid surgery.  Post op course was uncomplicated.  Pain was well controlled.  Tolerated diet.  Post op calcium level on morning following surgery was 9.5 mg/dl.  Patient was prepared for discharge home on POD#1.  Consults: None  Treatments: surgery: left thyroid lobectomy, parathyroidectomy  Discharge Exam: Blood pressure (!) 105/45, pulse 66, temperature 97.7 F (36.5 C), temperature source Oral, resp. rate 16, height 5\' 4"  (1.626 m), weight 67.6 kg, SpO2 98 %. HEENT - clear Neck - wound dry and intact; minimal STS; voice normal Chest - clear bilaterally Cor - RRR   Disposition: Home  Discharge Instructions    Diet - low sodium heart healthy   Complete by: As directed    Discharge instructions   Complete by: As directed    [facility]  THYROID & PARATHYROID SURGERY:  POST-OP INSTRUCTIONS  Always review your discharge instruction sheet from the facility where your surgery was performed.  A prescription for pain medication may be given to you upon discharge.  Take your pain medication as prescribed.  If narcotic pain medicine is not needed, then you may take acetaminophen (Tylenol) or ibuprofen (Advil) as needed.  Take your usually prescribed medications unless otherwise directed.  If you need a refill on your pain medication, please contact our office during regular business hours.  Prescriptions cannot be processed by our  office after 5 pm or on weekends.  Start with a light diet upon arrival home, such as soup and crackers or toast.  Be sure to drink plenty of fluids daily.  Resume your normal diet the day after surgery.  Most patients will experience some swelling and bruising on the chest and neck area.  Ice packs will help.  Swelling and bruising can take several days to resolve.   It is common to experience some constipation after surgery.  Increasing fluid intake and taking a stool softener (Colace) will usually help or prevent this problem.  A mild laxative (Milk of Magnesia or Miralax) should be taken according to package directions if there has been no bowel movement after 48 hours.  You have steri-strips and a gauze dressing over your incision.  You may remove the gauze bandage on the second day after surgery, and you may shower at that time.  Leave your steri-strips (small skin tapes) in place directly over the incision.  These strips should remain on the skin for 5-7 days and then be removed.  You may get them wet in the shower and pat them dry.  You may resume regular (light) daily activities beginning the next day (such as daily self-care, walking, climbing stairs) gradually increasing activities as tolerated.  You may have sexual intercourse when it is comfortable.  Refrain from any heavy lifting or straining until approved by your doctor.  You may drive when you no longer are taking prescription pain medication, you can comfortably wear a seatbelt, and you can safely maneuver your car and apply brakes.  You should see your doctor in the office for a follow-up appointment approximately  three weeks after your surgery.  Make sure that you call for this appointment within a day or two after you arrive home to insure a convenient appointment time.  WHEN TO CALL YOUR DOCTOR: -- Fever greater than 101.5 -- Inability to urinate -- Nausea and/or vomiting - persistent -- Extreme swelling or bruising --  Continued bleeding from incision -- Increased pain, redness, or drainage from the incision -- Difficulty swallowing or breathing -- Muscle cramping or spasms -- Numbness or tingling in hands or around lips  The clinic staff is available to answer your questions during regular business hours.  Please don't hesitate to call and ask to speak to one of the nurses if you have concerns.  Armandina Gemma, MD [facility] Office: [phone]   Increase activity slowly   Complete by: As directed    No dressing needed   Complete by: As directed      Allergies as of 01/14/2019      Reactions   Codeine Nausea Only   Sulfamethoxazole    REACTION: GI upset      Medication List    TAKE these medications   ALPRAZolam 0.25 MG tablet Commonly known as: XANAX TAKE (1) TABLET DAILY AS NEEDED. What changed:   how much to take  how to take this  when to take this  reasons to take this  additional instructions   aspirin EC 81 MG tablet Take 81 mg by mouth at bedtime.   buPROPion 300 MG 24 hr tablet Commonly known as: WELLBUTRIN XL TAKE 1 TABLET ONCE DAILY.   citalopram 20 MG tablet Commonly known as: CELEXA TAKE 1 TABLET ONCE DAILY.   diclofenac sodium 1 % Gel Commonly known as: VOLTAREN Apply 2 g topically 4 (four) times daily. What changed:   when to take this  reasons to take this   ibuprofen 200 MG tablet Commonly known as: ADVIL Take 400 mg by mouth every 6 (six) hours as needed for moderate pain.   Vitamin D 50 MCG (2000 UT) Caps Take 2,000 Units by mouth daily.   Investigational vitamin D 600 UNITS capsule SWOG S0812 Take 1 capsule (600 Units total) by mouth daily. Take with food.   lisinopril 20 MG tablet Commonly known as: ZESTRIL Take 20 mg by mouth daily.   pantoprazole 20 MG tablet Commonly known as: PROTONIX Take 1 tablet (20 mg total) by mouth daily.   rosuvastatin 40 MG tablet Commonly known as: CRESTOR TAKE 1 TABLET ONCE  DAILY. What changed: when to take this   Turmeric 500 MG Caps Take 500 mg by mouth daily.      Follow-up Information    Armandina Gemma, MD. Schedule an appointment as soon as possible for a visit in 3 week(s).   Specialty: General Surgery Contact information: [address] [place] [address] [phone]           Earnstine Regal, MD, [facility] Office: [phone]   Signed: Armandina Gemma 01/14/2019, 9:09 AM

## 2019-01-15 LAB — SURGICAL PATHOLOGY

## 2019-01-27 ENCOUNTER — Other Ambulatory Visit: Payer: Self-pay | Admitting: Family Medicine

## 2019-01-28 ENCOUNTER — Ambulatory Visit: Payer: PPO | Attending: Internal Medicine

## 2019-01-28 DIAGNOSIS — Z23 Encounter for immunization: Secondary | ICD-10-CM

## 2019-01-28 NOTE — Progress Notes (Signed)
   Covid-19 Vaccination Clinic  Name:  Crystal Carroll    MRN: [id number] DOB: 04-06-1945  01/28/2019  Crystal Carroll was observed post Covid-19 immunization for 15 minutes without incidence. She was provided with Vaccine Information Sheet and instruction to access the V-Safe system.   Crystal Carroll was instructed to call 911 with any severe reactions post vaccine: Marland Kitchen Difficulty breathing  . Swelling of your face and throat  . A fast heartbeat  . A bad rash all over your body  . Dizziness and weakness    Immunizations Administered    Name Date Dose VIS Date Route   Pfizer COVID-19 Vaccine 01/28/2019  3:57 PM 0.3 mL 12/18/2018 Intramuscular   Manufacturer: [place]   Lot: [id number]   [place]: [id number]

## 2019-02-01 DIAGNOSIS — E210 Primary hyperparathyroidism: Secondary | ICD-10-CM | POA: Diagnosis not present

## 2019-02-11 ENCOUNTER — Encounter: Payer: Self-pay | Admitting: Cardiology

## 2019-02-11 ENCOUNTER — Other Ambulatory Visit: Payer: Self-pay

## 2019-02-11 ENCOUNTER — Ambulatory Visit (INDEPENDENT_AMBULATORY_CARE_PROVIDER_SITE_OTHER): Payer: PPO | Admitting: Cardiology

## 2019-02-11 VITALS — BP 115/58 | HR 72 | Temp 97.1°F | Ht 64.0 in | Wt 150.2 lb

## 2019-02-11 DIAGNOSIS — I350 Nonrheumatic aortic (valve) stenosis: Secondary | ICD-10-CM | POA: Diagnosis not present

## 2019-02-11 DIAGNOSIS — I498 Other specified cardiac arrhythmias: Secondary | ICD-10-CM

## 2019-02-11 DIAGNOSIS — E785 Hyperlipidemia, unspecified: Secondary | ICD-10-CM | POA: Diagnosis not present

## 2019-02-11 DIAGNOSIS — I10 Essential (primary) hypertension: Secondary | ICD-10-CM | POA: Diagnosis not present

## 2019-02-11 NOTE — Progress Notes (Signed)
Cardiology Office Note:    Date:  02/21/2019   ID:  Herold Harms, DOB 23-Jan-1945, MRN [id number]  PCP:  Marin Olp, MD  Cardiologist:  No primary care provider on file.  Electrophysiologist:  None   Referring MD: Marin Olp, MD   No chief complaint on file.   History of Present Illness:     Crystal Carroll is a 74 y.o. female with a hx of hypertension, hyperlipidemia, hyperparathyroidism, CKD stage III who presents for follow-up.  She was referred by Dr. Yong Channel for evaluation of palpitations, initial visit on 11/11/18.  She reports that she had been having palpitations.  Has been occurring for over 1 year.  Does not have feeling like heart is racing.  Can last for up to 1 minute.  No chest pain, dyspnea.  She wore a 30-day cardiac monitor, which showed predominantly sinus rhythm and most of the triggered events were sinus rhythm.  However there were occasional episodes of accelerated junctional rhythm and 2 of her triggered events involved a junctional rhythm.  Initial clinic visit on 11/11/2018, discussed that she likely had some degree of sick sinus syndrome commended getting a exercise treadmill test to evaluate for chronotropic incompetence.  She declined a treadmill test, as she did not think that she could walk on a treadmill due to her knee pain.  TTE was checked evaluate for structural heart disease, which showed normal LV systolic function, normal RV function, mild aortic stenosis, small PFO.    Smoked 50 years, 1ppd, quit in 04/03/2013.  Father died of MI at 48.    Since last appointment, has had 2 episodes of palpitions, lasted less than 1 minute and resolved.  No chest pain or dyspnea.    Since last clnic visit underwent thyroid surgery.  Found to be benign.  No lightheadedness or syncope.    Past Medical History:  Diagnosis Date  . Anxiety   . Aortic atherosclerosis ([place])   . Aortic valvar stenosis 11/2018   Mild, noted on ECHO  . Arthritis    cervical neck  .  CKD (chronic kidney disease), stage III    Stable  . GERD (gastroesophageal reflux disease)   . History of TIA (transient ischemic attack)    per patient, didn't know about it  . Hyperlipidemia   . Hyperparathyroidism ([place])   . Hypertension    History of   . Junctional rhythm   . Osteoporosis   . Palpitations   . Thyroid nodule    2.0 cm solid nodule in the inferior left thyroid lobe     Past Surgical History:  Procedure Laterality Date  . CERVICAL LAMINECTOMY  04-04-02   Earle Gell  . COLONOSCOPY    . ESOPHAGOGASTRODUODENOSCOPY    . PARATHYROIDECTOMY Left 01/13/2019   Procedure: LEFT PARATHYROIDECTOMY;  Surgeon: Armandina Gemma, MD;  Location: [facility];  Service: General;  Laterality: Left;  . THYROID LOBECTOMY Left 01/13/2019   Procedure: LEFT THYROID LOBECTOMY;  Surgeon: Armandina Gemma, MD;  Location: [facility];  Service: General;  Laterality: Left;    Current Medications: Current Meds  Medication Sig  . ALPRAZolam (XANAX) 0.25 MG tablet TAKE (1) TABLET DAILY AS NEEDED. (Patient taking differently: Take 0.25 mg by mouth daily as needed for anxiety. )  . aspirin EC 81 MG tablet Take 81 mg by mouth at bedtime.   Marland Kitchen buPROPion (WELLBUTRIN XL) 300 MG 24 hr tablet TAKE 1 TABLET ONCE DAILY.  Marland Kitchen Cholecalciferol (VITAMIN D) 50 MCG (April 04, 1998  UT) CAPS Take 2,000 Units by mouth daily.  . citalopram (CELEXA) 20 MG tablet TAKE 1 TABLET ONCE DAILY.  Marland Kitchen diclofenac sodium (VOLTAREN) 1 % GEL Apply 2 g topically 4 (four) times daily. (Patient taking differently: Apply 2 g topically 4 (four) times daily as needed (pain). )  . ibuprofen (ADVIL) 200 MG tablet Take 400 mg by mouth every 6 (six) hours as needed for moderate pain.  . Investigational vitamin D 600 UNITS capsule SWOG S0812 Take 1 capsule (600 Units total) by mouth daily. Take with food.  Marland Kitchen lisinopril (ZESTRIL) 20 MG tablet Take 20 mg by mouth daily.  . pantoprazole (PROTONIX) 20 MG tablet Take 1 tablet (20 mg total) by mouth daily.  . rosuvastatin  (CRESTOR) 40 MG tablet TAKE 1 TABLET ONCE DAILY. (Patient taking differently: Take 40 mg by mouth at bedtime. )  . Turmeric 500 MG CAPS Take 500 mg by mouth daily.      Allergies:   Codeine and Sulfamethoxazole   Social History   Socioeconomic History  . Marital status: Married    Spouse name: Leroy Sea  . Number of children: 1  . Years of education: 50  . Highest education level: Associate degree: academic program  Occupational History  . Occupation: Retired  Tobacco Use  . Smoking status: Former Smoker    Packs/day: 0.75    Years: 50.00    Pack years: 37.50    Types: Cigarettes    Quit date: 03/07/2013    Years since quitting: 5.9  . Smokeless tobacco: Never Used  Substance and Sexual Activity  . Alcohol use: Yes    Alcohol/week: 0.0 standard drinks    Comment: 2 per day  . Drug use: No  . Sexual activity: Not on file  Other Topics Concern  . Not on file  Social History Narrative   Married to [facility] (patient) in 1981. 2nd marriage-1 child with 1 adopted grandchild.       Retired as Statistician.       Hobbies: antiques, former Firefighter      Exercise: none currently.       Patient is right-handed. She lives with her husband in a two level home. She does not exercise.      Social Determinants of Health   Financial Resource Strain:   . Difficulty of Paying Living Expenses: Not on file  Food Insecurity:   . Worried About Charity fundraiser in the Last Year: Not on file  . Ran Out of Food in the Last Year: Not on file  Transportation Needs:   . Lack of Transportation (Medical): Not on file  . Lack of Transportation (Non-Medical): Not on file  Physical Activity:   . Days of Exercise per Week: Not on file  . Minutes of Exercise per Session: Not on file  Stress:   . Feeling of Stress : Not on file  Social Connections:   . Frequency of Communication with Friends and Family: Not on file  . Frequency of Social Gatherings with Friends and Family: Not  on file  . Attends Religious Services: Not on file  . Active Member of Clubs or Organizations: Not on file  . Attends Archivist Meetings: Not on file  . Marital Status: Not on file     Family History: The patient's family history includes Heart attack (age of onset: 29) in her father; Melanoma (age of onset: 47) in her mother.  ROS:   Please see the  history of present illness.     All other systems reviewed and are negative.  EKGs/Labs/Other Studies Reviewed:    The following studies were reviewed today:   EKG:  EKG is  ordered today.  The ekg ordered today demonstrates normal sinus rhythm, rate 66, nonspecific <1 mmST depression in leads II, aVF, V4 through 6  Recent Labs: 05/22/2018: ALT 9 01/06/2019: Hemoglobin 12.6; Platelets 208 01/14/2019: BUN 17; Creatinine, Ser 1.19; Potassium 3.8; Sodium 138  Recent Lipid Panel    Component Value Date/Time   CHOL 153 05/22/2018 1513   TRIG 92 05/22/2018 1513   HDL 72 05/22/2018 1513   CHOLHDL 2.1 05/22/2018 1513   VLDL 26.2 05/12/2017 1107   LDLCALC 63 05/22/2018 1513   LDLDIRECT 106 (H) 07/19/2016 1603    Physical Exam:    VS:  BP (!) 115/58   Pulse 72   Temp (!) 97.1 F (36.2 C)   Ht 5\' 4"  (1.626 m)   Wt 150 lb 3.2 oz (68.1 kg)   LMP  (LMP Unknown)   SpO2 99%   BMI 25.78 kg/m     Wt Readings from Last 3 Encounters:  02/11/19 150 lb 3.2 oz (68.1 kg)  01/13/19 149 lb (67.6 kg)  01/06/19 148 lb (67.1 kg)     GEN:  Well nourished, well developed in no acute distress HEENT: Normal NECK: No JVD; No carotid bruits LYMPHATICS: No lymphadenopathy CARDIAC: RRR, no murmurs, rubs, gallops RESPIRATORY:  Clear to auscultation without rales, wheezing or rhonchi  ABDOMEN: Soft, non-tender, non-distended MUSCULOSKELETAL:  No edema; No deformity  SKIN: Warm and dry NEUROLOGIC:  Alert and oriented x 3 PSYCHIATRIC:  Normal affect   TTE 11/24/18: 1. Left ventricular ejection fraction, by visual estimation, is 60 to   65%. The left ventricle has normal function. There is no left ventricular  hypertrophy.  2. The average left ventricular global longitudinal strain is -22.0 %.  3. Global right ventricle has normal systolic function.The right  ventricular size is normal. No increase in right ventricular wall  thickness.  4. Left atrial size was normal.  5. Right atrial size was normal.  6. The mitral valve is normal in structure. No evidence of mitral valve  regurgitation.  7. The tricuspid valve is not well visualized. Tricuspid valve  regurgitation is not demonstrated.  8. The aortic valve is tricuspid. Aortic valve regurgitation is not  visualized. Mild aortic valve stenosis. Vmax 2.4, MG 13, AVA 1.5, DI 0.49  9. The pulmonic valve was not well visualized. Pulmonic valve  regurgitation is not visualized.  10. The inferior vena cava is normal in size with greater than 50%  respiratory variability, suggesting right atrial pressure of 3 mmHg.  11. TR signal is inadequate for assessing pulmonary artery systolic  pressure.  12. Small PFO   ASSESSMENT:    1. Junctional rhythm   2. Aortic valve stenosis, etiology of cardiac valve disease unspecified   3. Essential hypertension   4. Hyperlipidemia, unspecified hyperlipidemia type    PLAN:     Junctional rhythm: Occasional episodes of accelerated junctional rhythm noted on monitor.  Rates in 81s to 35s.  Suspect likely has some degree of sick sinus syndrome and had discussed getting a exercise treadmill test to evaluate for chronotropic incompetence.  She declines a treadmill test as did not think that she could walk on a treadmill due to her knee pain.  She has no evidence of symptomatic bradycardia, no indication for pacemaker at this time.  TTE shows mild aortic stenosis, but otherwise no structural heart disease.  Recommend avoidance of AV nodal blocking agents.  Aortic stenosis: Mild on TTE on 11/24/2018.  Will monitor  Hyperlipidemia: On  rosuvastatin 40 mg daily.  Last LDL 63 on 05/22/2018  HTN: on lisinopril 20 mg daily.  Appears controlled  RTC in 12 months  Medication Adjustments/Labs and Tests Ordered: Current medicines are reviewed at length with the patient today.  Concerns regarding medicines are outlined above.  No orders of the defined types were placed in this encounter.  No orders of the defined types were placed in this encounter.   Patient Instructions  Medication Instructions:  Your physician recommends that you continue on your current medications as directed. Please refer to the Current Medication list given to you today.  *If you need a refill on your cardiac medications before your next appointment, please call your pharmacy*  Lab Work: NONE If you have labs (blood work) drawn today and your tests are completely normal, you will receive your results only by: Marland Kitchen MyChart Message (if you have MyChart) OR . A paper copy in the mail If you have any lab test that is abnormal or we need to change your treatment, we will call you to review the results.  Testing/Procedures: Your physician has requested that you have an echocardiogram (IN ONE YEAR) Echocardiography is a painless test that uses sound waves to create images of your heart. It provides your doctor with information about the size and shape of your heart and how well your heart's chambers and valves are working. This procedure takes approximately one hour. There are no restrictions for this procedure.  Follow-Up: At [facility], you and your health needs are our priority.  As part of our continuing mission to provide you with exceptional heart care, we have created designated Provider Care Teams.  These Care Teams include your primary Cardiologist (physician) and Advanced Practice Providers (APPs -  Physician Assistants and Nurse Practitioners) who all work together to provide you with the care you need, when you need it.  Your next appointment:    12 month(s)  The format for your next appointment:   In Person  Provider:   Oswaldo Milian, MD         Signed, Donato Heinz, MD  02/21/2019 2:13 PM    [place]

## 2019-02-11 NOTE — Patient Instructions (Signed)
Medication Instructions:  Your physician recommends that you continue on your current medications as directed. Please refer to the Current Medication list given to you today.  *If you need a refill on your cardiac medications before your next appointment, please call your pharmacy*  Lab Work: NONE If you have labs (blood work) drawn today and your tests are completely normal, you will receive your results only by: Marland Kitchen MyChart Message (if you have MyChart) OR . A paper copy in the mail If you have any lab test that is abnormal or we need to change your treatment, we will call you to review the results.  Testing/Procedures: Your physician has requested that you have an echocardiogram (IN ONE YEAR) Echocardiography is a painless test that uses sound waves to create images of your heart. It provides your doctor with information about the size and shape of your heart and how well your heart's chambers and valves are working. This procedure takes approximately one hour. There are no restrictions for this procedure.  Follow-Up: At [facility], you and your health needs are our priority.  As part of our continuing mission to provide you with exceptional heart care, we have created designated Provider Care Teams.  These Care Teams include your primary Cardiologist (physician) and Advanced Practice Providers (APPs -  Physician Assistants and Nurse Practitioners) who all work together to provide you with the care you need, when you need it.  Your next appointment:   12 month(s)  The format for your next appointment:   In Person  Provider:   Oswaldo Milian, MD

## 2019-02-18 ENCOUNTER — Ambulatory Visit: Payer: PPO | Attending: Internal Medicine

## 2019-02-18 DIAGNOSIS — Z23 Encounter for immunization: Secondary | ICD-10-CM | POA: Insufficient documentation

## 2019-02-18 NOTE — Progress Notes (Signed)
   Covid-19 Vaccination Clinic  Name:  Crystal Carroll    MRN: [id number] DOB: January 27, 1945  02/18/2019  [name] was observed post Covid-19 immunization for 15 minutes without incidence. She was provided with Vaccine Information Sheet and instruction to access the V-Safe system.   [name] was instructed to call 911 with any severe reactions post vaccine: Marland Kitchen Difficulty breathing  . Swelling of your face and throat  . A fast heartbeat  . A bad rash all over your body  . Dizziness and weakness    Immunizations Administered    Name Date Dose VIS Date Route   Pfizer COVID-19 Vaccine 02/18/2019  5:38 PM 0.3 mL 12/18/2018 Intramuscular   Manufacturer: [place]   Lot: [id number]   NDC: [id number]

## 2019-02-19 ENCOUNTER — Other Ambulatory Visit: Payer: Self-pay

## 2019-02-23 ENCOUNTER — Encounter: Payer: Self-pay | Admitting: Internal Medicine

## 2019-02-23 ENCOUNTER — Other Ambulatory Visit: Payer: Self-pay

## 2019-02-23 ENCOUNTER — Ambulatory Visit (INDEPENDENT_AMBULATORY_CARE_PROVIDER_SITE_OTHER): Payer: PPO | Admitting: Internal Medicine

## 2019-02-23 VITALS — BP 120/62 | HR 66 | Temp 97.9°F | Ht 64.0 in | Wt 151.0 lb

## 2019-02-23 DIAGNOSIS — M810 Age-related osteoporosis without current pathological fracture: Secondary | ICD-10-CM | POA: Diagnosis not present

## 2019-02-23 DIAGNOSIS — D497 Neoplasm of unspecified behavior of endocrine glands and other parts of nervous system: Secondary | ICD-10-CM | POA: Diagnosis not present

## 2019-02-23 DIAGNOSIS — E213 Hyperparathyroidism, unspecified: Secondary | ICD-10-CM | POA: Diagnosis not present

## 2019-02-23 DIAGNOSIS — Z9009 Acquired absence of other part of head and neck: Secondary | ICD-10-CM

## 2019-02-23 LAB — BASIC METABOLIC PANEL
BUN: 14 mg/dL (ref 6–23)
CO2: 27 mEq/L (ref 19–32)
Calcium: 9.5 mg/dL (ref 8.4–10.5)
Chloride: 104 mEq/L (ref 96–112)
Creatinine, Ser: 1.01 mg/dL (ref 0.40–1.20)
GFR: 53.52 mL/min — ABNORMAL LOW (ref >60.00)
Glucose, Bld: 89 mg/dL (ref 70–99)
Potassium: 3.9 mEq/L (ref 3.5–5.1)
Sodium: 138 mEq/L (ref 135–145)

## 2019-02-23 LAB — TSH
TSH: 3.09 (ref 0.41–5.90)
TSH: 3.09 u[IU]/mL (ref 0.35–4.50)

## 2019-02-23 LAB — T4, FREE: Free T4: 0.58 ng/dL — ABNORMAL LOW (ref 0.60–1.60)

## 2019-02-23 LAB — ALBUMIN: Albumin: 4.3 g/dL (ref 3.5–5.2)

## 2019-02-23 NOTE — Progress Notes (Signed)
Name: Crystal Carroll  MRN/ DOB: [id number], Dec 09, 1945    Age/ Sex: 74 y.o., female    PCP: Marin Olp, MD   Reason for Endocrinology Evaluation: Hypercalcemia/ Osteoporosis      Date of Initial Endocrinology Evaluation:  07/17/2018    HPI: Crystal Carroll is a 74 y.o. female with a past medical history of GERD,and  gait instability . The patient presented for initial endocrinology clinic visit on 07/17/2018 for consultative assistance with her Hypercalcemia    Hypercalcemia History :  Crystal Carroll has been noted to have intermittent hypercalcemia since 2008 but this has become more persistent in 05/2018.  24- hr urine Ca./cr ratio of 0.03 which is consistent with primary hyperparathyroidism (07/2018). She is s/p left inferior parathyroidectomy 01/13/2019 with normalization of calcium.    THYROID HISTORY:   A thyroid ultrasound was performed 10/2018 which demonstrated a 2 cm left inferior nodule. FNA showed FLUS Afirma was suspicious. Crystal Carroll underwent left lobectomy  01/13/2019 with pathology report consistent with NIFTP.      Osteoporosis History : Crystal Carroll was diagnosed with osteoporosis:2019. Was started on Fosamax 05/2017 but reported non-compliance.    SUBJECTIVE:   Today (02/24/2019):   Denies fatigue, no local neck symptoms Has been having constipation - has been using colace  Up to date on colon screen    HISTORY:  Past Medical History:  Past Medical History:  Diagnosis Date  . Anxiety   . Aortic atherosclerosis ([place])   . Aortic valvar stenosis 11/2018   Mild, noted on ECHO  . Arthritis    cervical neck  . CKD (chronic kidney disease), stage III    Stable  . GERD (gastroesophageal reflux disease)   . History of TIA (transient ischemic attack)    per patient, didn't know about it  . Hyperlipidemia   . Hyperparathyroidism ([place])   . Hypertension    History of   . Junctional rhythm   . Osteoporosis   . Palpitations   . Thyroid nodule    2.0 cm solid nodule in  the inferior left thyroid lobe    Past Surgical History:  Past Surgical History:  Procedure Laterality Date  . CERVICAL LAMINECTOMY  2004   Earle Gell  . COLONOSCOPY    . ESOPHAGOGASTRODUODENOSCOPY    . PARATHYROIDECTOMY Left 01/13/2019   Procedure: LEFT PARATHYROIDECTOMY;  Surgeon: Armandina Gemma, MD;  Location: [facility];  Service: General;  Laterality: Left;  . THYROID LOBECTOMY Left 01/13/2019   Procedure: LEFT THYROID LOBECTOMY;  Surgeon: Armandina Gemma, MD;  Location: [facility];  Service: General;  Laterality: Left;      Social History:  reports that she quit smoking about 5 years ago. Her smoking use included cigarettes. She has a 37.50 pack-year smoking history. She has never used smokeless tobacco. She reports current alcohol use. She reports that she does not use drugs.  Family History: family history includes Heart attack (age of onset: 28) in her father; Melanoma (age of onset: 64) in her mother.   HOME MEDICATIONS: Allergies as of 02/23/2019      Reactions   Codeine Nausea Only   Sulfamethoxazole    REACTION: GI upset      Medication List       Accurate as of February 23, 2019 11:59 PM. If you have any questions, ask your nurse or doctor.        ALPRAZolam 0.25 MG tablet Commonly known as: XANAX TAKE (1) TABLET DAILY AS NEEDED. What changed:  how much to take  how to take this  when to take this  reasons to take this  additional instructions   aspirin EC 81 MG tablet Take 81 mg by mouth at bedtime.   buPROPion 300 MG 24 hr tablet Commonly known as: WELLBUTRIN XL TAKE 1 TABLET ONCE DAILY.   citalopram 20 MG tablet Commonly known as: CELEXA TAKE 1 TABLET ONCE DAILY.   diclofenac sodium 1 % Gel Commonly known as: VOLTAREN Apply 2 g topically 4 (four) times daily. What changed:   when to take this  reasons to take this   ibuprofen 200 MG tablet Commonly known as: ADVIL Take 400 mg by mouth every 6 (six) hours as needed for moderate pain.     Vitamin D 50 MCG (2000 UT) Caps Take 2,000 Units by mouth daily.   Investigational vitamin D 600 UNITS capsule SWOG S0812 Take 1 capsule (600 Units total) by mouth daily. Take with food.   lisinopril 20 MG tablet Commonly known as: ZESTRIL Take 20 mg by mouth daily.   pantoprazole 20 MG tablet Commonly known as: PROTONIX Take 1 tablet (20 mg total) by mouth daily.   rosuvastatin 40 MG tablet Commonly known as: CRESTOR TAKE 1 TABLET ONCE DAILY. What changed: when to take this   Turmeric 500 MG Caps Take 500 mg by mouth daily.         REVIEW OF SYSTEMS: A comprehensive ROS was conducted with the patient and is negative except as per HPI and below:  Review of Systems  HENT: Negative for congestion and sore throat.   Gastrointestinal: Positive for constipation. Negative for nausea.    OBJECTIVE:  VS: BP 120/62 (BP Location: Left Arm, Patient Position: Sitting, Cuff Size: Normal)   Pulse 66   Temp 97.9 F (36.6 C)   Ht 5\' 4"  (1.626 m)   Wt 151 lb (68.5 kg)   LMP  (LMP Unknown)   SpO2 97%   BMI 25.92 kg/m    Wt Readings from Last 3 Encounters:  02/23/19 151 lb (68.5 kg)  02/11/19 150 lb 3.2 oz (68.1 kg)  01/13/19 149 lb (67.6 kg)     EXAM: General: Crystal Carroll appears well and is in NAD  Neck: General: Supple without adenopathy. Thyroid:  No goiter or nodules appreciated. Surgical scar noted  Lungs: Clear with good BS bilat with no rales, rhonchi, or wheezes  Heart: Auscultation: RRR.  Abdomen: Normoactive bowel sounds, soft, nontender, without masses or organomegaly palpable  Extremities: BL LE: No pretibial edema normal.  Mental Status: Judgment, insight: Intact Orientation: Oriented to time, place, and person Mood and affect: No depression, anxiety, or agitation     DATA REVIEWED:  Results for LOVE, TREST (MRN [id number]) as of 02/24/2019 12:47  Ref. Range 02/23/2019 14:44  Sodium Latest Ref Range: 135 - 145 mEq/L 138  Potassium Latest Ref Range: 3.5 -  5.1 mEq/L 3.9  Chloride Latest Ref Range: 96 - 112 mEq/L 104  CO2 Latest Ref Range: 19 - 32 mEq/L 27  Glucose Latest Ref Range: 70 - 99 mg/dL 89  BUN Latest Ref Range: 6 - 23 mg/dL 14  Creatinine Latest Ref Range: 0.40 - 1.20 mg/dL 1.01  Calcium Latest Ref Range: 8.4 - 10.5 mg/dL 9.5  Albumin Latest Ref Range: 3.5 - 5.2 g/dL 4.3  GFR Latest Ref Range: >60.00 mL/min 53.52 (L)  TSH Latest Ref Range: 0.35 - 4.50 uIU/mL 3.09  T4,Free(Direct) Latest Ref Range: 0.60 - 1.60 ng/dL 0.58 (L)  Pathology report 01/13/2019   PARATHYROID, LEFT INFERIOR, PARATHYROIDECTOMY:  - Hypercellular parathyroid tissue (0.236 g)   B. THYROID, LEFT, LOBECTOMY:  - Noninvasive follicular thyroid neoplasm with papillary-like nuclear  features (NIFTP), 1.8 cm  - Margins are uninvolved by neoplasm    ASSESSMENT/PLAN/RECOMMENDATIONS:   1. 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 - These tumors are managed as neoplasms rather then malignancy  - No TSH goal is needed with NIFTP . On today's labs TSH is normal but FT4 is low, this is related to lobectomy, some people recover after a period of time post-op . I suggest holding off on any LT-4 replacement but rechecking in 8 weeks   2. Primary Hyperparathyroidism, S/P left inferior parathyroidectomy 01/13/2019:   - S/P left inferior parathyroidectomy  - Repeat calcium is normal. Improved GFR   3. Osteoporosis :  - Crystal Carroll with hx of non-compliance with Fosamax , this was mainly due to GERD symptoms - In the setting of  Primary hyperparathyroidism ,the first line of treatment of osteoporosis  is parathyroidectomy. Typically an improvement in the bone density is expected to occur within 6 months postoperatively.  - We will consider a DXA on next visit   A portal message was sent to the Crystal Carroll    02/24/2019    F/u in 6 months     Signed electronically by: Mack Guise, MD  [facility] Endocrinology  [facility] [address] [place], [place] [address] Phone: [phone] FAX: [phone]   CC: Marin Olp, [place] [address] Phone: [phone] Fax: [phone]   Return to Endocrinology clinic as below: Future Appointments  Date Time Provider [place]  04/15/2019  1:20 PM Marin Olp, MD [facility]  08/24/2019  2:00 PM Taron Mondor, Melanie Crazier, MD [facility] None

## 2019-02-23 NOTE — Patient Instructions (Signed)
-   Benefiber is an over the counter  stool softner, you can use this every day or every other day to soften your stools. Its powder that you mix with a drink.   - Miralax is an over the counter laxative - you can use that as needed ( hopefully you won't need it as much ) - this is also a powder that gets mixed with a drink

## 2019-02-24 ENCOUNTER — Encounter: Payer: Self-pay | Admitting: Internal Medicine

## 2019-02-24 DIAGNOSIS — M810 Age-related osteoporosis without current pathological fracture: Secondary | ICD-10-CM | POA: Insufficient documentation

## 2019-02-24 DIAGNOSIS — E213 Hyperparathyroidism, unspecified: Secondary | ICD-10-CM | POA: Insufficient documentation

## 2019-02-24 DIAGNOSIS — D497 Neoplasm of unspecified behavior of endocrine glands and other parts of nervous system: Secondary | ICD-10-CM | POA: Insufficient documentation

## 2019-02-24 DIAGNOSIS — Z9009 Acquired absence of other part of head and neck: Secondary | ICD-10-CM | POA: Insufficient documentation

## 2019-02-24 LAB — THYROGLOBULIN LEVEL: Thyroglobulin: 6.4 ng/mL

## 2019-02-24 LAB — THYROGLOBULIN ANTIBODY: Thyroglobulin Ab: <1 IU/mL (ref <=1)

## 2019-03-31 ENCOUNTER — Encounter: Payer: Self-pay | Admitting: Family Medicine

## 2019-04-15 ENCOUNTER — Ambulatory Visit (INDEPENDENT_AMBULATORY_CARE_PROVIDER_SITE_OTHER): Payer: PPO | Admitting: Family Medicine

## 2019-04-15 ENCOUNTER — Encounter: Payer: Self-pay | Admitting: Family Medicine

## 2019-04-15 ENCOUNTER — Other Ambulatory Visit: Payer: Self-pay

## 2019-04-15 VITALS — BP 130/62 | HR 68 | Temp 98.1°F | Ht 64.0 in | Wt 153.8 lb

## 2019-04-15 DIAGNOSIS — N1831 Chronic kidney disease, stage 3a: Secondary | ICD-10-CM

## 2019-04-15 DIAGNOSIS — E785 Hyperlipidemia, unspecified: Secondary | ICD-10-CM | POA: Diagnosis not present

## 2019-04-15 DIAGNOSIS — I700 Atherosclerosis of aorta: Secondary | ICD-10-CM | POA: Diagnosis not present

## 2019-04-15 DIAGNOSIS — R350 Frequency of micturition: Secondary | ICD-10-CM

## 2019-04-15 DIAGNOSIS — R310 Gross hematuria: Secondary | ICD-10-CM

## 2019-04-15 DIAGNOSIS — I10 Essential (primary) hypertension: Secondary | ICD-10-CM

## 2019-04-15 DIAGNOSIS — F411 Generalized anxiety disorder: Secondary | ICD-10-CM | POA: Diagnosis not present

## 2019-04-15 LAB — POC URINALSYSI DIPSTICK (AUTOMATED)
Bilirubin, UA: NEGATIVE
Blood, UA: POSITIVE
Glucose, UA: NEGATIVE
Ketones, UA: NEGATIVE
Nitrite, UA: POSITIVE
Protein, UA: POSITIVE — AB
Spec Grav, UA: 1.015 (ref 1.010–1.025)
Urobilinogen, UA: 0.2 E.U./dL
pH, UA: 6 (ref 5.0–8.0)

## 2019-04-15 LAB — URINALYSIS, MICROSCOPIC ONLY: RBC / HPF: NONE SEEN (ref 0–?)

## 2019-04-15 NOTE — Progress Notes (Signed)
Phone [phone] In person visit   Subjective:   Crystal Carroll is a 74 y.o. year old very pleasant female patient who presents for/with See problem oriented charting Chief Complaint  Patient presents with  . Hyperlipidemia  . Hypertension  . Hematuria    and frequency x 2 weeks.    This visit occurred during the SARS-CoV-2 public health emergency.  Safety protocols were in place, including screening questions prior to the visit, additional usage of staff PPE, and extensive cleaning of exam room while observing appropriate contact time as indicated for disinfecting solutions.   Past Medical History-  Patient Active Problem List   Diagnosis Date Noted  . History of lacunar cerebrovascular accident (CVA) 06/18/2018    Priority: High  . Caregiver burden 04/03/2016    Priority: High  . Hypercalcemia 01/28/2007    Priority: High  . Aortic atherosclerosis ([place]) 06/12/2017    Priority: Medium  . Vitamin B12 deficiency 05/12/2017    Priority: Medium  . Cervical radiculopathy 04/03/2016    Priority: Medium  . Essential tremor 04/18/2015    Priority: Medium  . Heavy alcohol use 04/14/2014    Priority: Medium  . CKD (chronic kidney disease), stage III 10/15/2013    Priority: Medium  . TOBACCO USE 12/24/2006    Priority: Medium  . Hyperlipidemia 05/26/2006    Priority: Medium  . Anxiety state 05/26/2006    Priority: Medium  . Essential hypertension 05/26/2006    Priority: Medium  . Patellofemoral arthritis 03/05/2018    Priority: Low  . Adenomatous colon polyp 06/14/2014    Priority: Low  . H/O cold sores 10/15/2013    Priority: Low  . GERD 05/26/2006    Priority: Low  . Osteoporosis without current pathological fracture 02/24/2019  . History of lobectomy of thyroid 02/24/2019  . 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 Hyperparathyroidism ([place]) 02/24/2019  . Neoplasm of uncertain behavior of thyroid gland  01/12/2019  . Thyroid nodule 01/02/2019  . Menopausal flushing 12/15/2018  . Pruritus of vagina 12/15/2018  . Hyperparathyroidism, primary ([place]) 07/17/2018  . Osteoporosis 03/13/2018  . DDD (degenerative disc disease), lumbar 03/05/2018  . Unstable gait 01/20/2018  . Palpitations 05/12/2017  . Diarrhea 04/14/2014    Medications- reviewed and updated Current Outpatient Medications  Medication Sig Dispense Refill  . ALPRAZolam (XANAX) 0.25 MG tablet TAKE (1) TABLET DAILY AS NEEDED. (Patient taking differently: Take 0.25 mg by mouth daily as needed for anxiety. ) 30 tablet 0  . aspirin EC 81 MG tablet Take 81 mg by mouth at bedtime.     Marland Kitchen buPROPion (WELLBUTRIN XL) 300 MG 24 hr tablet TAKE 1 TABLET ONCE DAILY. 90 tablet 0  . Cholecalciferol (VITAMIN D) 50 MCG (2000 UT) CAPS Take 2,000 Units by mouth daily.    . citalopram (CELEXA) 20 MG tablet TAKE 1 TABLET ONCE DAILY. 90 tablet 0  . diclofenac sodium (VOLTAREN) 1 % GEL Apply 2 g topically 4 (four) times daily. (Patient taking differently: Apply 2 g topically 4 (four) times daily as needed (pain). ) 100 g 3  . ibuprofen (ADVIL) 200 MG tablet Take 400 mg by mouth every 6 (six) hours as needed for moderate pain.    . Investigational vitamin D 600 UNITS capsule SWOG S0812 Take 1 capsule (600 Units total) by mouth daily. Take with food. 30 capsule 11  . pantoprazole (PROTONIX) 20 MG tablet Take 1 tablet (20 mg total) by mouth daily. 90 tablet 3  .  rosuvastatin (CRESTOR) 40 MG tablet TAKE 1 TABLET ONCE DAILY. (Patient taking differently: Take 40 mg by mouth at bedtime. ) 90 tablet 0  . Turmeric 500 MG CAPS Take 500 mg by mouth daily.      No current facility-administered medications for this visit.     Objective:  BP 130/62   Pulse 68   Temp 98.1 F (36.7 C) (Temporal)   Ht 5\' 4"  (1.626 m)   Wt 153 lb 12.8 oz (69.8 kg)   LMP  (LMP Unknown)   SpO2 96%   BMI 26.40 kg/m  Gen: NAD, resting comfortably CV: RRR no murmurs rubs or  gallops Lungs: CTAB no crackles, wheeze, rhonchi Ext: no edema Skin: warm, dry    Assessment and Plan   #caregiver burden- Some fatigue when stays up late. Feels much better when sleeps. But stays up late after caring for husband- to get some her time - feels like she really needs this.   #social update- looking at transitioning to assisted living or similar to help care for husband.   # Urinary frequency #gross hematuria S:Patient states she has had increased frequency and urgency for last several weeks. Has noticed blood in urine one time about a week ago. Denies any pain or issues with voiding. No fever, chills or nausea or vomiting.  A/P:  Patient with blood in urine gross hematuria- UA concerning for infection potentially so we will get urine culture. She prefers to wait on the culture before starting treatment.   I asked her to come back for repeat urine test 10-14 days after treatment if there is a urinary tract infection. If no infection, we need to send her to urology.   #Hypertension/CKD stage III S: Patient remains off lisinopril fortunately blood pressure remains controlled. She does not check blood pressure at home. She does not exercise.   -GFR has been stable largely in the 40s lately- in the past had been in the 30s- most recently better in 50s BP Readings from Last 3 Encounters:  04/15/19 130/62  02/23/19 120/62  02/11/19 (!) 115/58  A/P: blood pressure continues to look great without meds- continue to monitor   GFR stable high 50s last month- will defer for now  #Osteoporosis S: Patient was placed on Fosamax once a week in the past but current plan for treatment is parathyroidectomy in light of hyperparathyroidism/high calcium A/P: has follow up with endocrinology- looks like they are planning on repeat bone density   #Hyperlipidemia/aortic atherosclerosis/ history of stroke- on aspirin S: Compliant with rosuvastatin 40 mg.  LDL goal at least under 100 -Aortic  atherosclerosis noted CT 06/26/2017 for lung cancer screening.  Risk factor modification with blood pressure and lipid control plan. Lab Results  Component Value Date   CHOL 153 05/22/2018   HDL 72 05/22/2018   LDLCALC 63 05/22/2018   LDLDIRECT 106 (H) 07/19/2016   TRIG 92 05/22/2018   CHOLHDL 2.1 05/22/2018  A/P: she will be due soon for repeat lipid panel- will defer to next visit . Remain on aspirin for history of stroke. LDL at goal under 70 for choelsterol on rosuvastatin- continue current meds  #GERD S: Compliant with Protonix 40 mg-->20mg  and had to add pepcid with dinner in past- she has not needed it A/P: doing ok on protonix 20mg .  - discussed possibly trying pepcid as she has done well lately- she wants to hold off for now- really fears having reflux issues    #Anxiety/stress management S: Compliant with  Celexa 20 mg, Wellbutrin 300 mg extended release.  Also takes sparing Xanax as needed for anxiety. She has not had to take any xanax in over a month.  A/P: reasonable control- continue current meds   Recommended follow up: Return in about 3 months (around 07/15/2019) for physical or sooner if needed. Future Appointments  Date Time Provider [place]  08/24/2019  2:00 PM Shamleffer, Melanie Crazier, MD [facility] None   Lab/Order associations:   ICD-10-CM   1. Frequency of urination  R35.0 POCT Urinalysis Dipstick (Automated)    Urine Culture  2. Gross hematuria  R31.0 Urine Culture    Urine Microscopic    CANCELED: Urine Microscopic  3. Hyperlipidemia, unspecified hyperlipidemia type  E78.5   4. Essential hypertension  I10   5. Stage 3a chronic kidney disease  N18.31   6. Aortic atherosclerosis (HCC)  I70.0   7. Anxiety state  F41.1    Return precautions advised.  [name], MD

## 2019-04-15 NOTE — Patient Instructions (Addendum)
UA concerning for infection potentially so we will get urine culture. She prefers to wait on the culture before starting treatment.   I asked her to come back for repeat urine test 10-14 days after treatment if there is a urinary tract infection. If no infection, we need to send her to urology.   No changes in other meds today   Recommended follow up: Return in about 3 months (around 07/15/2019) for physical or sooner if needed.

## 2019-04-17 LAB — URINE CULTURE
MICRO NUMBER:: 10341931
SPECIMEN QUALITY:: ADEQUATE

## 2019-04-19 ENCOUNTER — Other Ambulatory Visit: Payer: Self-pay

## 2019-04-19 DIAGNOSIS — R319 Hematuria, unspecified: Secondary | ICD-10-CM

## 2019-04-19 MED ORDER — CEPHALEXIN 500 MG PO CAPS: 500.0000 mg | ORAL_CAPSULE | Freq: Two times a day (BID) | ORAL | 0 refills | Status: DC

## 2019-05-12 ENCOUNTER — Other Ambulatory Visit: Payer: Self-pay | Admitting: Family Medicine

## 2019-05-13 NOTE — Progress Notes (Signed)
Called and LVM for pt to schedule lab appt.  

## 2019-06-17 ENCOUNTER — Other Ambulatory Visit: Payer: Self-pay

## 2019-06-17 ENCOUNTER — Other Ambulatory Visit (INDEPENDENT_AMBULATORY_CARE_PROVIDER_SITE_OTHER): Payer: PPO

## 2019-06-17 DIAGNOSIS — R319 Hematuria, unspecified: Secondary | ICD-10-CM

## 2019-06-18 LAB — URINALYSIS, MICROSCOPIC ONLY
RBC / HPF: NONE SEEN (ref 0–?)
WBC, UA: NONE SEEN (ref 0–?)

## 2019-07-13 DIAGNOSIS — Z1231 Encounter for screening mammogram for malignant neoplasm of breast: Secondary | ICD-10-CM | POA: Diagnosis not present

## 2019-07-13 LAB — HM MAMMOGRAPHY

## 2019-07-15 ENCOUNTER — Encounter: Payer: Self-pay | Admitting: Family Medicine

## 2019-07-19 ENCOUNTER — Ambulatory Visit (INDEPENDENT_AMBULATORY_CARE_PROVIDER_SITE_OTHER): Payer: PPO | Admitting: Family Medicine

## 2019-07-19 ENCOUNTER — Telehealth: Payer: Self-pay | Admitting: Family Medicine

## 2019-07-19 ENCOUNTER — Encounter: Payer: Self-pay | Admitting: Family Medicine

## 2019-07-19 ENCOUNTER — Other Ambulatory Visit: Payer: Self-pay

## 2019-07-19 VITALS — BP 128/62 | HR 62 | Temp 98.7°F | Ht 64.0 in | Wt 157.0 lb

## 2019-07-19 DIAGNOSIS — D440 Neoplasm of uncertain behavior of thyroid gland: Secondary | ICD-10-CM

## 2019-07-19 DIAGNOSIS — I700 Atherosclerosis of aorta: Secondary | ICD-10-CM | POA: Diagnosis not present

## 2019-07-19 DIAGNOSIS — M810 Age-related osteoporosis without current pathological fracture: Secondary | ICD-10-CM

## 2019-07-19 DIAGNOSIS — E785 Hyperlipidemia, unspecified: Secondary | ICD-10-CM | POA: Diagnosis not present

## 2019-07-19 DIAGNOSIS — E538 Deficiency of other specified B group vitamins: Secondary | ICD-10-CM | POA: Diagnosis not present

## 2019-07-19 DIAGNOSIS — Z636 Dependent relative needing care at home: Secondary | ICD-10-CM

## 2019-07-19 DIAGNOSIS — Z87891 Personal history of nicotine dependence: Secondary | ICD-10-CM

## 2019-07-19 DIAGNOSIS — N1831 Chronic kidney disease, stage 3a: Secondary | ICD-10-CM

## 2019-07-19 DIAGNOSIS — D497 Neoplasm of unspecified behavior of endocrine glands and other parts of nervous system: Secondary | ICD-10-CM | POA: Diagnosis not present

## 2019-07-19 DIAGNOSIS — Z8673 Personal history of transient ischemic attack (TIA), and cerebral infarction without residual deficits: Secondary | ICD-10-CM

## 2019-07-19 DIAGNOSIS — I350 Nonrheumatic aortic (valve) stenosis: Secondary | ICD-10-CM

## 2019-07-19 DIAGNOSIS — E041 Nontoxic single thyroid nodule: Secondary | ICD-10-CM

## 2019-07-19 DIAGNOSIS — R269 Unspecified abnormalities of gait and mobility: Secondary | ICD-10-CM

## 2019-07-19 DIAGNOSIS — R002 Palpitations: Secondary | ICD-10-CM

## 2019-07-19 DIAGNOSIS — Q782 Osteopetrosis: Secondary | ICD-10-CM

## 2019-07-19 DIAGNOSIS — E210 Primary hyperparathyroidism: Secondary | ICD-10-CM | POA: Diagnosis not present

## 2019-07-19 DIAGNOSIS — Z0000 Encounter for general adult medical examination without abnormal findings: Secondary | ICD-10-CM | POA: Diagnosis not present

## 2019-07-19 DIAGNOSIS — I10 Essential (primary) hypertension: Secondary | ICD-10-CM | POA: Diagnosis not present

## 2019-07-19 DIAGNOSIS — Z9009 Acquired absence of other part of head and neck: Secondary | ICD-10-CM

## 2019-07-19 MED ORDER — ALPRAZOLAM 0.25 MG PO TABS: 0.2500 mg | ORAL_TABLET | Freq: Every day | ORAL | 0 refills | Status: DC | PRN

## 2019-07-19 NOTE — Progress Notes (Signed)
Phone [phone]   Subjective:  Patient presents today for their annual physical. Chief complaint-noted.   See problem oriented charting- Review of Systems  Constitutional: Negative for chills and fever.  HENT: Negative for ear pain, hearing loss and tinnitus.   Eyes: Negative for blurred vision and double vision.  Respiratory: Negative for cough, shortness of breath and wheezing.   Cardiovascular: Negative for chest pain, palpitations and leg swelling.  Gastrointestinal: Negative for heartburn, nausea and vomiting.  Genitourinary: Positive for urgency. Negative for dysuria and frequency.       Has been ongoing issue. Denies any burning or discomfort   Musculoskeletal: Negative for back pain, joint pain and neck pain.  Skin: Negative for rash.  Neurological: Positive for dizziness. Negative for seizures, weakness and headaches.       Has some dizziness  when moving to fast.   Endo/Heme/Allergies: Does not bruise/bleed easily.  Psychiatric/Behavioral: Negative for memory loss, substance abuse and suicidal ideas. The patient does not have insomnia.    The following were reviewed and entered/updated in epic: Past Medical History:  Diagnosis Date  . Anxiety   . Aortic atherosclerosis ([place])   . Aortic valvar stenosis 11/2018   Mild, noted on ECHO  . Arthritis    cervical neck  . CKD (chronic kidney disease), stage III    Stable  . GERD (gastroesophageal reflux disease)   . History of TIA (transient ischemic attack)    per patient, didn't know about it  . Hyperlipidemia   . Hyperparathyroidism ([place])   . Hypertension    History of   . Junctional rhythm   . Osteoporosis   . Palpitations   . Thyroid nodule    2.0 cm solid nodule in the inferior left thyroid lobe    Patient Active Problem List   Diagnosis Date Noted  . History of lobectomy of thyroid 02/24/2019    Priority: High  . 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 Hyperparathyroidism, primary ([place]) 07/17/2018    Priority: High  . History of lacunar cerebrovascular accident (CVA) 06/18/2018    Priority: High  . Caregiver burden 04/03/2016    Priority: High  . Mild aortic stenosis 07/19/2019    Priority: Medium  . Osteoporosis 03/13/2018    Priority: Medium  . DDD (degenerative disc disease), lumbar 03/05/2018    Priority: Medium  . Aortic atherosclerosis ([place]) 06/12/2017    Priority: Medium  . Palpitations 05/12/2017    Priority: Medium  . Vitamin B12 deficiency 05/12/2017    Priority: Medium  . Cervical radiculopathy 04/03/2016    Priority: Medium  . Essential tremor 04/18/2015    Priority: Medium  . Heavy alcohol use 04/14/2014    Priority: Medium  . CKD (chronic kidney disease), stage III 10/15/2013    Priority: Medium  . Former smoker 12/24/2006    Priority: Medium  . Hyperlipidemia 05/26/2006    Priority: Medium  . Anxiety state 05/26/2006    Priority: Medium  . Essential hypertension 05/26/2006    Priority: Medium  . Patellofemoral arthritis 03/05/2018    Priority: Low  . Unstable gait 01/20/2018    Priority: Low  . Adenomatous colon polyp 06/14/2014    Priority: Low  . H/O cold sores 10/15/2013    Priority: Low  . GERD 05/26/2006    Priority: Low   Past Surgical History:  Procedure Laterality Date  . CERVICAL LAMINECTOMY  2004   [facility]  .  COLONOSCOPY    . ESOPHAGOGASTRODUODENOSCOPY    . PARATHYROIDECTOMY Left 01/13/2019   Procedure: LEFT PARATHYROIDECTOMY;  Surgeon: Armandina Gemma, MD;  Location: [facility];  Service: General;  Laterality: Left;  . THYROID LOBECTOMY Left 01/13/2019   Procedure: LEFT THYROID LOBECTOMY;  Surgeon: Armandina Gemma, MD;  Location: [facility];  Service: General;  Laterality: Left;    Family History  Problem Relation Age of Onset  . Heart attack Father 28       smoker, rheumatic fever as child  . Melanoma Mother 75       labia    Medications- reviewed and  updated Current Outpatient Medications  Medication Sig Dispense Refill  . ALPRAZolam (XANAX) 0.25 MG tablet TAKE (1) TABLET DAILY AS NEEDED. (Patient taking differently: Take 0.25 mg by mouth daily as needed for anxiety. ) 30 tablet 0  . aspirin EC 81 MG tablet Take 81 mg by mouth at bedtime.     Marland Kitchen buPROPion (WELLBUTRIN XL) 300 MG 24 hr tablet TAKE 1 TABLET ONCE DAILY. 90 tablet 0  . cephALEXin (KEFLEX) 500 MG capsule Take 1 capsule (500 mg total) by mouth 2 (two) times daily. 14 capsule 0  . Cholecalciferol (VITAMIN D) 50 MCG (2000 UT) CAPS Take 2,000 Units by mouth daily.    . citalopram (CELEXA) 20 MG tablet TAKE 1 TABLET ONCE DAILY. 90 tablet 0  . diclofenac sodium (VOLTAREN) 1 % GEL Apply 2 g topically 4 (four) times daily. (Patient taking differently: Apply 2 g topically 4 (four) times daily as needed (pain). ) 100 g 3  . ibuprofen (ADVIL) 200 MG tablet Take 400 mg by mouth every 6 (six) hours as needed for moderate pain.    . Investigational vitamin D 600 UNITS capsule SWOG S0812 Take 1 capsule (600 Units total) by mouth daily. Take with food. 30 capsule 11  . pantoprazole (PROTONIX) 20 MG tablet Take 1 tablet (20 mg total) by mouth daily. 90 tablet 3  . rosuvastatin (CRESTOR) 40 MG tablet TAKE 1 TABLET ONCE DAILY. 90 tablet 0  . Turmeric 500 MG CAPS Take 500 mg by mouth daily.      No current facility-administered medications for this visit.    Allergies-reviewed and updated Allergies  Allergen Reactions  . Codeine Nausea Only  . Sulfamethoxazole     REACTION: GI upset    Social History   Social History Narrative   Married to [employer] (patient) in 1981. 2nd marriage-1 child with 1 adopted grandchild.       Retired as Statistician.       Hobbies: antiques, former Firefighter      Exercise: none currently.       Patient is right-handed. She lives with her husband in a two level home. She does not exercise.      Objective  Objective:  BP 128/62   Pulse  62   Temp 98.7 F (37.1 C) (Temporal)   Ht 5\' 4"  (1.626 m)   Wt 157 lb (71.2 kg)   LMP  (LMP Unknown)   SpO2 97%   BMI 26.95 kg/m  Gen: NAD, resting comfortably HEENT: Mucous membranes are moist. Oropharynx normal Neck: Status post partial thyroidectomy CV: RRR no murmurs rubs or gallops Lungs: CTAB no crackles, wheeze, rhonchi Abdomen: soft/nontender/nondistended/normal bowel sounds. No rebound or guarding.  Ext: no edema Skin: warm, dry Neuro: grossly normal, moves all extremities, PERRLA   Assessment and Plan   74 y.o. female presenting for annual physical.  Health Maintenance counseling: 1. Anticipatory guidance: Patient counseled regarding regular dental exams q6 months, eye exams yearly,  avoiding smoking and second hand smoke , limiting alcohol to 1 beverage per day . Has been limiting to spritzer's  2 a day- only has 2 oz of wine in each beverage so under 5 oz per day.  2. Risk factor reduction:  Advised patient of need for regular exercise and diet rich and fruits and vegetables to reduce risk of heart attack and stroke. Exercise- does not exercise- a lot of stress on home front. Diet- Does feel like she eats healthy. Weight up some - she is going to consider cutting down on sweets- has had more cravings lately.  Wt Readings from Last 3 Encounters:  07/19/19 157 lb (71.2 kg)  04/15/19 153 lb 12.8 oz (69.8 kg)  02/23/19 151 lb (68.5 kg)  3. Immunizations/screenings/ancillary studies-  up to date Immunization History  Administered Date(s) Administered  . Fluad Quad(high Dose 65+) 09/28/2018  . Influenza Split 12/03/2010, 12/09/2011  . Influenza Whole 10/18/2009  . Influenza, High Dose Seasonal PF 11/02/2012, 10/18/2015, 10/21/2016, 10/21/2017  . Influenza,inj,Quad PF,6+ Mos 10/15/2013, 12/06/2014  . Influenza-Unspecified 10/08/2015  . PFIZER SARS-COV-2 Vaccination 01/28/2019, 02/18/2019  . Pneumococcal Conjugate-13 04/15/2014  . Pneumococcal Polysaccharide-23 12/03/2010   . Td 01/07/1998, 10/18/2009  . Tdap 10/18/2016  . Zoster 10/18/2009  . Zoster Recombinat (Shingrix) 11/04/2018  4. Cervical cancer screening- no longer gets -switching gynecologists- likes to continue due to family history melanoma in GU region    5. Breast cancer screening-  breast exam self exam monthly-and so follow-up with GYN  and mammogram last mammogram 07/13/2019  6. Colon cancer screening - will call for appointment.  Last done in May 2016 with 5-year repeat needed due to adenomatous colon polyp history 7. Skin cancer screening-sees Dr. Derrel Nip- needs follow up. advised regular sunscreen use. Denies worrisome, changing, or new skin lesions. Has follow up with dermatology. Has spot on face.  8. Birth control/STD check- monogomous/postmenopausal 9. Osteoporosis screening at 33- see dsicussion below  -former smoker- quit 4 yrs ago. Smoked a pack or over a day. Check UA with labs. Enrolled in lung cancer screening program  Status of chronic or acute concerns   #low risk thyroid tumor removed earlier this year.  Also with history of parathyroidectomy in last few years.  We will check TSH, T3, T4.  Has follow-up with endocrinology Dr. Kelton Pillar.  Also check calcium levels  #Palpitations- none since cardiology visit in February 2021-I recommend a 1 year follow-up.  She also had mild aortic stenosis  # dizziness-ongoing issues with quick turns or change sin head position.  She also continues to have some unstable gait-would like to follow-up with physical therapy and this was ordered today  #cant tolerate b12 pills- after thyroid surgery.  We will check B12 levels to make sure this is not low  #Hypertension/CKD stage III S: Patient remains off lisinopril unfortunately blood pressure remains controlled  -GFR has been stable largely in the 40s lately- in the past had been in the 30s A/P: Hypertension is reasonably well-controlled-continue current medication  Chronic kidney disease  stage III has been stable-update CMP today.  She continues to avoid NSAIDs  #Osteoporosis S: Patient was placed on Fosamax once a week in the past but current plan for treatment is parathyroidectomy in light of hyperparathyroidism/high calcium A/P: History of osteoporosis may have been related to hyperparathyroidism-now status post parathyroidectomy-we will update bone density and reevaluate-can reconsider Fosamax  depending on levels  #Hyperlipidemia/aortic atherosclerosis/ history of stroke- on aspirin  S: Compliant with rosuvastatin 40 mg.  LDL goal at least under 100-prefer under 70 with stroke history  Lab Results  Component Value Date   CHOL 153 05/22/2018   HDL 72 05/22/2018   LDLCALC 63 05/22/2018   LDLDIRECT 106 (H) 07/19/2016   TRIG 92 05/22/2018   CHOLHDL 2.1 05/22/2018  -Aortic atherosclerosis noted CT 06/26/2017 for lung cancer screening.  Risk factor modification with blood pressure and lipid control plan. A/P: hyperlipidemia- LDL goal under 70 with stroke and atherosclerosis history- updatelipids now   #GERD S: Compliant with Protonix 40 mg-->20mg  and had to add pepcid with dinner later was able to come off- prefer to reduce with prior osteoporosis reading A/P: doing well- continue current medicine    #Anxiety/stress management-having caregiver burden with husband with Parkinson's S: Compliant with Celexa 20 mg, Wellbutrin 300 mg extended release.  Also takes sparing Xanax as needed for anxiety. Getting 3 days of help now- pays out of pocket- fortunately reasonable. Did not work out well through home health A/P: doing reasonably well despite stressors. Continue current meds She does use very sparingly Xanax for anxiety-I did offer to refill this today.  If any falls in the future would need to discontinue  #Hematuria earlier this year-resolved on repeat microscopic exam after treatment for UTI  #Knees not doing well- voltaren gel is helping.   #Unstable gait S: Started  January 2020.  Stat head CT to rule out subdural hematoma.  Initially also seem to have some lower extremity weakness and numbness.  There was concern for lumbar stenosis.  Symptoms improved with time.  Ultimately did MRI of the brain to rule out prior cerebellar stroke-none was detected.  Did have prior lacunar stroke and patient was placed back on aspirin.  Patient later saw Dr. Tomi Likens July 28, 2018-MRI of cervical spine without contrast was ordered.  There were some disc bulges and MRI of lumbar spine was also ordered September 2020-this showed spinal stenosis which was thought to be contributing to leg pain and balance issues.  Neurology offered referral to neurosurgery orthopedics A/P:  issues could be related to lumbar stenosis-with ongoing gait and balance issues will refer her to physical therapy to see if exercise can be helpful-she reports having one session in the past and unhelpful and is interested in follow-up   Recommended follow up:  Future Appointments  Date Time Provider [place]  08/27/2019  1:40 PM Shamleffer, Melanie Crazier, MD [facility] None  10/21/2019  4:20 PM Marin Olp, MD [facility]  07/20/2020  2:40 PM Marin Olp, MD [facility]   Lab/Order associations: not fasting   ICD-10-CM   1. Preventative health care  Z00.00   2. Hyperlipidemia, unspecified hyperlipidemia type  E78.5   3. Essential hypertension  I10 CBC with Differential/Platelet    Comprehensive metabolic panel    Lipid panel  4. Hyperparathyroidism, primary ([place])  E21.0   5. Aortic atherosclerosis (HCC)  I70.0   6. History of lacunar cerebrovascular accident (CVA)  Z86.73   7. Caregiver burden  Z63.6   8. Osteopetrosis  Q78.2 VITAMIN D 25 Hydroxy (Vit-D Deficiency, Fractures)    DG Bone Density  9. B12 deficiency  E53.8 Vitamin B12  10. History of smoking  Z87.891 CANCELED: POCT Urinalysis Dipstick (Automated)  11. Noninvasive follicular neoplasm of thyroid with  papillary-like nuclear features  D49.7   12. Thyroid nodule  E04.1 TSH  T3    T4  13. Gait abnormality  R26.9 Ambulatory referral to Physical Therapy  14. History of lobectomy of thyroid  Z90.09   15. Age-related osteoporosis without current pathological fracture  M81.0   16. Palpitations  R00.2   17. Mild aortic stenosis  I35.0   18. Stage 3a chronic kidney disease  N18.31     Meds ordered this encounter  Medications  . ALPRAZolam (XANAX) 0.25 MG tablet    Sig: Take 1 tablet (0.25 mg total) by mouth daily as needed for anxiety.    Dispense:  30 tablet    Refill:  0    Return precautions advised.  [name], MD

## 2019-07-19 NOTE — Telephone Encounter (Signed)
Patient would like for physical therapy referral to go to [place].

## 2019-07-19 NOTE — Patient Instructions (Addendum)
Health Maintenance Due  Topic Date Due  . COLONOSCOPY will call for appointment  05/31/2019  So glad you were able to get some help a few days a week for your husband.   We will call you within two weeks about your referral to PT . If you do not hear within 3 weeks, give [place] a call.   Let your new gynecologist know that Dr. Yong Channel is your PCP when you seen them so that we can get notes.    avoid any antiinflammatory medications. Do not take Advil, motrin, ibuprofen and aleve.   Exercise: make time to get on your bike for exercising. To help with your weight loss. You may want to also cut down on sweets as well.   If you have any questions or concerns before your next scheduled appointment please give our office a call.   Make an appointment with Dermatology soon for your check up.   Continue on the medications for your reflux. If you have increase in symptoms let our office know.   Order has been placed for bone density please make appointment on your way out.    Urinary frequency: try to limit liquids before bed time. Going to bathroom before bed time. Think about the options that we reviewed for medication. Let our office know if you would like to try one. But keep in mind any of these my increase your blood pressure.     Recommended follow up: No follow-ups on file.

## 2019-07-20 ENCOUNTER — Other Ambulatory Visit: Payer: Self-pay

## 2019-07-20 DIAGNOSIS — R269 Unspecified abnormalities of gait and mobility: Secondary | ICD-10-CM

## 2019-07-20 LAB — CBC WITH DIFFERENTIAL/PLATELET
Basophils Absolute: 0 10*3/uL (ref 0.0–0.1)
Basophils Relative: 0.8 % (ref 0.0–3.0)
Eosinophils Absolute: 0.3 10*3/uL (ref 0.0–0.7)
Eosinophils Relative: 4.1 % (ref 0.0–5.0)
HCT: 36.1 % (ref 36.0–46.0)
Hemoglobin: 12.5 g/dL (ref 12.0–15.0)
Lymphocytes Relative: 16.6 % (ref 12.0–46.0)
Lymphs Abs: 1 10*3/uL (ref 0.7–4.0)
MCHC: 34.8 g/dL (ref 30.0–36.0)
MCV: 86.1 fl (ref 78.0–100.0)
Monocytes Absolute: 0.4 10*3/uL (ref 0.1–1.0)
Monocytes Relative: 7.3 % (ref 3.0–12.0)
Neutro Abs: 4.3 10*3/uL (ref 1.4–7.7)
Neutrophils Relative %: 71.2 % (ref 43.0–77.0)
Platelets: 190 10*3/uL (ref 150.0–400.0)
RBC: 4.19 Mil/uL (ref 3.87–5.11)
RDW: 14.2 % (ref 11.5–15.5)
WBC: 6.1 10*3/uL (ref 4.0–10.5)

## 2019-07-20 LAB — COMPREHENSIVE METABOLIC PANEL
ALT: 8 U/L (ref 0–35)
AST: 11 U/L (ref 0–37)
Albumin: 4.4 g/dL (ref 3.5–5.2)
Alkaline Phosphatase: 70 U/L (ref 39–117)
BUN: 19 mg/dL (ref 6–23)
CO2: 25 mEq/L (ref 19–32)
Calcium: 9.3 mg/dL (ref 8.4–10.5)
Chloride: 108 mEq/L (ref 96–112)
Creatinine, Ser: 1.15 mg/dL (ref 0.40–1.20)
GFR: 46.03 mL/min — ABNORMAL LOW (ref >60.00)
Glucose, Bld: 117 mg/dL — ABNORMAL HIGH (ref 70–99)
Potassium: 3.7 mEq/L (ref 3.5–5.1)
Sodium: 140 mEq/L (ref 135–145)
Total Bilirubin: 0.4 mg/dL (ref 0.2–1.2)
Total Protein: 6.3 g/dL (ref 6.0–8.3)

## 2019-07-20 LAB — LIPID PANEL
Cholesterol: 146 mg/dL (ref 0–200)
HDL: 57.3 mg/dL (ref >39.00)
LDL Cholesterol: 49 mg/dL (ref 0–99)
NonHDL: 88.29
Total CHOL/HDL Ratio: 3
Triglycerides: 194 mg/dL — ABNORMAL HIGH (ref 0.0–149.0)
VLDL: 38.8 mg/dL (ref 0.0–40.0)

## 2019-07-20 LAB — VITAMIN B12: Vitamin B-12: 263 pg/mL (ref 211–911)

## 2019-07-20 LAB — VITAMIN D 25 HYDROXY (VIT D DEFICIENCY, FRACTURES): VITD: 33.71 ng/mL (ref 30.00–100.00)

## 2019-07-20 LAB — TSH: TSH: 2.89 u[IU]/mL (ref 0.35–4.50)

## 2019-07-20 NOTE — Telephone Encounter (Signed)
Referral updated

## 2019-07-21 LAB — T3: T3, Total: 96 ng/dL (ref 76–181)

## 2019-07-21 LAB — T4: T4, Total: 6.1 ug/dL (ref 5.1–11.9)

## 2019-08-02 ENCOUNTER — Ambulatory Visit: Payer: PPO | Admitting: Physical Therapy

## 2019-08-05 DIAGNOSIS — D2262 Melanocytic nevi of left upper limb, including shoulder: Secondary | ICD-10-CM | POA: Diagnosis not present

## 2019-08-05 DIAGNOSIS — L304 Erythema intertrigo: Secondary | ICD-10-CM | POA: Diagnosis not present

## 2019-08-05 DIAGNOSIS — D225 Melanocytic nevi of trunk: Secondary | ICD-10-CM | POA: Diagnosis not present

## 2019-08-05 DIAGNOSIS — D1801 Hemangioma of skin and subcutaneous tissue: Secondary | ICD-10-CM | POA: Diagnosis not present

## 2019-08-05 DIAGNOSIS — D692 Other nonthrombocytopenic purpura: Secondary | ICD-10-CM | POA: Diagnosis not present

## 2019-08-05 DIAGNOSIS — L821 Other seborrheic keratosis: Secondary | ICD-10-CM | POA: Diagnosis not present

## 2019-08-22 ENCOUNTER — Other Ambulatory Visit: Payer: Self-pay | Admitting: Family Medicine

## 2019-08-24 ENCOUNTER — Ambulatory Visit: Payer: PPO | Admitting: Internal Medicine

## 2019-08-25 ENCOUNTER — Other Ambulatory Visit: Payer: Self-pay | Admitting: Family Medicine

## 2019-08-27 ENCOUNTER — Ambulatory Visit: Payer: PPO | Admitting: Internal Medicine

## 2019-08-27 NOTE — Progress Notes (Deleted)
Name: Crystal Carroll  MRN/ DOB: [id number], 1945/11/24    Age/ Sex: 74 y.o., female    PCP: Marin Olp, MD   Reason for Endocrinology Evaluation: Hypercalcemia/ Osteoporosis      Date of Initial Endocrinology Evaluation:  07/17/2018    HPI: Crystal Carroll is a 74 y.o. female with a past medical history of GERD,and  gait instability . The patient presented for initial endocrinology clinic visit on 07/17/2018 for consultative assistance with her Hypercalcemia    Hypercalcemia History :  [name] has been noted to have intermittent hypercalcemia since 2008 but this has become more persistent in 05/2018.  24- hr urine Ca./cr ratio of 0.03 which is consistent with primary hyperparathyroidism (07/2018). She is s/p left inferior parathyroidectomy 01/13/2019 with normalization of calcium.    THYROID HISTORY:   A thyroid ultrasound was performed 10/2018 which demonstrated a 2 cm left inferior nodule. FNA showed FLUS Afirma was suspicious. Pt underwent left lobectomy  01/13/2019 with pathology report consistent with NIFTP.      Osteoporosis History : Pt was diagnosed with osteoporosis:2019. Was started on Fosamax 05/2017 but reported non-compliance.    SUBJECTIVE:   Today (02/24/2019):   Denies fatigue, no local neck symptoms Has been having constipation - has been using colace  Up to date on colon screen    HISTORY:  Past Medical History:  Past Medical History:  Diagnosis Date  . Anxiety   . Aortic atherosclerosis ([place])   . Aortic valvar stenosis 11/2018   Mild, noted on ECHO  . Arthritis    cervical neck  . CKD (chronic kidney disease), stage III    Stable  . GERD (gastroesophageal reflux disease)   . History of TIA (transient ischemic attack)    per patient, didn't know about it  . Hyperlipidemia   . Hyperparathyroidism ([place])   . Hypertension    History of   . Junctional rhythm   . Osteoporosis   . Palpitations   . Thyroid nodule    2.0 cm solid nodule in  the inferior left thyroid lobe    Past Surgical History:  Past Surgical History:  Procedure Laterality Date  . CERVICAL LAMINECTOMY  2004   Earle Gell  . COLONOSCOPY    . ESOPHAGOGASTRODUODENOSCOPY    . PARATHYROIDECTOMY Left 01/13/2019   Procedure: LEFT PARATHYROIDECTOMY;  Surgeon: Armandina Gemma, MD;  Location: [facility];  Service: General;  Laterality: Left;  . THYROID LOBECTOMY Left 01/13/2019   Procedure: LEFT THYROID LOBECTOMY;  Surgeon: Armandina Gemma, MD;  Location: [facility];  Service: General;  Laterality: Left;      Social History:  reports that she quit smoking about 6 years ago. Her smoking use included cigarettes. She has a 37.50 pack-year smoking history. She has never used smokeless tobacco. She reports current alcohol use. She reports that she does not use drugs.  Family History: family history includes Heart attack (age of onset: 11) in her father; Melanoma (age of onset: 42) in her mother.   HOME MEDICATIONS: Allergies as of 08/27/2019      Reactions   Codeine Nausea Only   Sulfamethoxazole    REACTION: GI upset      Medication List       Accurate as of August 27, 2019 12:56 PM. If you have any questions, ask your nurse or doctor.        ALPRAZolam 0.25 MG tablet Commonly known as: XANAX Take 1 tablet (0.25 mg total) by mouth  daily as needed for anxiety.   aspirin EC 81 MG tablet Take 81 mg by mouth at bedtime.   buPROPion 300 MG 24 hr tablet Commonly known as: WELLBUTRIN XL TAKE 1 TABLET ONCE DAILY.   citalopram 20 MG tablet Commonly known as: CELEXA TAKE 1 TABLET ONCE DAILY.   diclofenac sodium 1 % Gel Commonly known as: VOLTAREN Apply 2 g topically 4 (four) times daily. What changed:   when to take this  reasons to take this   pantoprazole 20 MG tablet Commonly known as: PROTONIX Take 1 tablet (20 mg total) by mouth daily.   rosuvastatin 40 MG tablet Commonly known as: CRESTOR TAKE 1 TABLET ONCE DAILY.   Turmeric 500 MG Caps Take 500 mg by  mouth daily.   Vitamin D 50 MCG (2000 UT) Caps Take 2,000 Units by mouth daily.         REVIEW OF SYSTEMS: A comprehensive ROS was conducted with the patient and is negative except as per HPI and below:  Review of Systems  HENT: Negative for congestion and sore throat.   Gastrointestinal: Positive for constipation. Negative for nausea.    OBJECTIVE:  VS: LMP  (LMP Unknown)    Wt Readings from Last 3 Encounters:  07/19/19 157 lb (71.2 kg)  04/15/19 153 lb 12.8 oz (69.8 kg)  02/23/19 151 lb (68.5 kg)     EXAM: General: Pt appears well and is in NAD  Neck: General: Supple without adenopathy. Thyroid:  No goiter or nodules appreciated. Surgical scar noted  Lungs: Clear with good BS bilat with no rales, rhonchi, or wheezes  Heart: Auscultation: RRR.  Abdomen: Normoactive bowel sounds, soft, nontender, without masses or organomegaly palpable  Extremities: BL LE: No pretibial edema normal.  Mental Status: Judgment, insight: Intact Orientation: Oriented to time, place, and person Mood and affect: No depression, anxiety, or agitation     DATA REVIEWED:  Results for TOWANDA, HORNSTEIN (MRN [id number]) as of 02/24/2019 12:47  Ref. Range 02/23/2019 14:44  Sodium Latest Ref Range: 135 - 145 mEq/L 138  Potassium Latest Ref Range: 3.5 - 5.1 mEq/L 3.9  Chloride Latest Ref Range: 96 - 112 mEq/L 104  CO2 Latest Ref Range: 19 - 32 mEq/L 27  Glucose Latest Ref Range: 70 - 99 mg/dL 89  BUN Latest Ref Range: 6 - 23 mg/dL 14  Creatinine Latest Ref Range: 0.40 - 1.20 mg/dL 1.01  Calcium Latest Ref Range: 8.4 - 10.5 mg/dL 9.5  Albumin Latest Ref Range: 3.5 - 5.2 g/dL 4.3  GFR Latest Ref Range: >60.00 mL/min 53.52 (L)  TSH Latest Ref Range: 0.35 - 4.50 uIU/mL 3.09  T4,Free(Direct) Latest Ref Range: 0.60 - 1.60 ng/dL 0.58 (L)    Pathology report 01/13/2019   PARATHYROID, LEFT INFERIOR, PARATHYROIDECTOMY:  - Hypercellular parathyroid tissue (0.236 g)   B. THYROID, LEFT, LOBECTOMY:  -  Noninvasive follicular thyroid neoplasm with papillary-like nuclear  features (NIFTP), 1.8 cm  - Margins are uninvolved by neoplasm    ASSESSMENT/PLAN/RECOMMENDATIONS:   1. 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 - These tumors are managed as neoplasms rather then malignancy  - No TSH goal is needed with NIFTP . On today's labs TSH is normal but FT4 is low, this is related to lobectomy, some people recover after a period of time post-op . I suggest holding off  on any LT-4 replacement but rechecking in 8 weeks   2. Primary Hyperparathyroidism, S/P left inferior parathyroidectomy 01/13/2019:   - S/P left inferior parathyroidectomy  - Repeat calcium is normal. Improved GFR   3. Osteoporosis :  - Pt with hx of non-compliance with Fosamax , this was mainly due to GERD symptoms - In the setting of  Primary hyperparathyroidism ,the first line of treatment of osteoporosis  is parathyroidectomy. Typically an improvement in the bone density is expected to occur within 6 months postoperatively.  - We will consider a DXA on next visit   A portal message was sent to the pt   02/24/2019    F/u in 6 months     Signed electronically by: Mack Guise, MD  [facility] Endocrinology  [facility] [address] [place], [place] [address] Phone: [phone] FAX: [phone]   CC: Marin Olp, [place] [address] Phone: [phone] Fax: [phone]   Return to Endocrinology clinic as below: Future Appointments  Date Time Provider [place]  08/27/2019  1:40 PM Iyona Pehrson, Melanie Crazier, MD [facility] None  10/21/2019  4:20 PM Marin Olp, MD [facility]  07/20/2020  2:40 PM Yong Channel Brayton Mars, MD [facility]

## 2019-09-04 ENCOUNTER — Other Ambulatory Visit: Payer: Self-pay | Admitting: Family Medicine

## 2019-09-10 ENCOUNTER — Other Ambulatory Visit: Payer: Self-pay

## 2019-09-10 ENCOUNTER — Encounter: Payer: Self-pay | Admitting: Internal Medicine

## 2019-09-10 ENCOUNTER — Ambulatory Visit: Payer: PPO | Admitting: Internal Medicine

## 2019-09-10 VITALS — BP 110/60 | HR 69 | Ht 64.0 in | Wt 152.4 lb

## 2019-09-10 DIAGNOSIS — E213 Hyperparathyroidism, unspecified: Secondary | ICD-10-CM

## 2019-09-10 DIAGNOSIS — D497 Neoplasm of unspecified behavior of endocrine glands and other parts of nervous system: Secondary | ICD-10-CM

## 2019-09-10 DIAGNOSIS — M810 Age-related osteoporosis without current pathological fracture: Secondary | ICD-10-CM | POA: Diagnosis not present

## 2019-09-10 NOTE — Progress Notes (Signed)
Name: Crystal Carroll  MRN/ DOB: [id number], 1945/07/23    Age/ Sex: 74 y.o., female    PCP: Marin Olp, MD   Reason for Endocrinology Evaluation: Hypercalcemia/ Osteoporosis      Date of Initial Endocrinology Evaluation:  07/17/2018    HPI: Crystal Carroll is a 74 y.o. female with a past medical history of GERD,and  gait instability . The patient presented for initial endocrinology clinic visit on 07/17/2018 for consultative assistance with her Hypercalcemia    Hypercalcemia History :  Crystal Carroll has been noted to have intermittent hypercalcemia since 2008 but this has become more persistent in 05/2018.  24- hr urine Ca./cr ratio of 0.03 which is consistent with primary hyperparathyroidism (07/2018). Crystal Carroll is s/p left inferior parathyroidectomy 01/13/2019 with normalization of calcium.    THYROID HISTORY:   A thyroid ultrasound was performed 10/2018 which demonstrated a 2 cm left inferior nodule. FNA showed FLUS Afirma was suspicious. Crystal Carroll underwent left lobectomy  01/13/2019 with pathology report consistent with NIFTP.      Osteoporosis History : Crystal Carroll was diagnosed with osteoporosis:2019. Was started on Fosamax 05/2017 but reported non-compliance.    SUBJECTIVE:   Today (09/10/2019):  Crystal Carroll denies any local neck symptoms. No local neck symptoms  Crystal Carroll is relieved that her and her husband are moving to [place] Has been having constipation - has been using colace            HISTORY:  Past Medical History:  Past Medical History:  Diagnosis Date  . Anxiety   . Aortic atherosclerosis ([place])   . Aortic valvar stenosis 11/2018   Mild, noted on ECHO  . Arthritis    cervical neck  . CKD (chronic kidney disease), stage III    Stable  . GERD (gastroesophageal reflux disease)   . History of TIA (transient ischemic attack)    per patient, didn't know about it  . Hyperlipidemia   . Hyperparathyroidism ([place])   . Hypertension    History of   . Junctional rhythm   .  Osteoporosis   . Palpitations   . Thyroid nodule    2.0 cm solid nodule in the inferior left thyroid lobe    Past Surgical History:  Past Surgical History:  Procedure Laterality Date  . CERVICAL LAMINECTOMY  2004   Earle Gell  . COLONOSCOPY    . ESOPHAGOGASTRODUODENOSCOPY    . PARATHYROIDECTOMY Left 01/13/2019   Procedure: LEFT PARATHYROIDECTOMY;  Surgeon: Armandina Gemma, MD;  [facility];  Service: General;  Laterality: Left;  . THYROID LOBECTOMY Left 01/13/2019   Procedure: LEFT THYROID LOBECTOMY;  Surgeon: Armandina Gemma, MD;  Location: [facility];  Service: General;  Laterality: Left;      Social History:  reports that Crystal Carroll quit smoking about 6 years ago. Her smoking use included cigarettes. Crystal Carroll has a 37.50 pack-year smoking history. Crystal Carroll has never used smokeless tobacco. Crystal Carroll reports current alcohol use. Crystal Carroll reports that Crystal Carroll does not use drugs.  Family History: family history includes Heart attack (age of onset: 68) in her father; Melanoma (age of onset: 50) in her mother.   HOME MEDICATIONS: Allergies as of 09/10/2019      Reactions   Codeine Nausea Only   Sulfamethoxazole    REACTION: GI upset      Medication List       Accurate as of September 10, 2019  7:01 AM. If you have any questions, ask your nurse or doctor.  ALPRAZolam 0.25 MG tablet Commonly known as: XANAX Take 1 tablet (0.25 mg total) by mouth daily as needed for anxiety.   aspirin EC 81 MG tablet Take 81 mg by mouth at bedtime.   buPROPion 300 MG 24 hr tablet Commonly known as: WELLBUTRIN XL TAKE 1 TABLET ONCE DAILY.   citalopram 20 MG tablet Commonly known as: CELEXA TAKE 1 TABLET ONCE DAILY.   diclofenac sodium 1 % Gel Commonly known as: VOLTAREN Apply 2 g topically 4 (four) times daily. What changed:   when to take this  reasons to take this   pantoprazole 20 MG tablet Commonly known as: PROTONIX TAKE 1 TABLET ONCE DAILY.   rosuvastatin 40 MG tablet Commonly known as: CRESTOR TAKE  1 TABLET ONCE DAILY.   Turmeric 500 MG Caps Take 500 mg by mouth daily.   Vitamin D 50 MCG (2000 UT) Caps Take 2,000 Units by mouth daily.         REVIEW OF SYSTEMS: A comprehensive ROS was conducted with the patient and is negative except as per HPI and below:  Review of Systems  HENT: Negative for congestion and sore throat.   Gastrointestinal: Positive for constipation. Negative for nausea.    OBJECTIVE:  VS: LMP  (LMP Unknown)    Wt Readings from Last 3 Encounters:  07/19/19 157 lb (71.2 kg)  04/15/19 153 lb 12.8 oz (69.8 kg)  02/23/19 151 lb (68.5 kg)     EXAM: General: Crystal Carroll appears well and is in NAD  Neck: General: Supple without adenopathy. Thyroid:  No goiter or nodules appreciated. Surgical scar noted  Lungs: Clear with good BS bilat with no rales, rhonchi, or wheezes  Heart: Auscultation: RRR.  Abdomen: Normoactive bowel sounds, soft, nontender, without masses or organomegaly palpable  Extremities: BL LE: No pretibial edema normal.  Mental Status: Judgment, insight: Intact Orientation: Oriented to time, place, and person Mood and affect: No depression, anxiety, or agitation     DATA REVIEWED:  Results for AVONNA, IRIBE (MRN [id number]) as of 09/10/2019 13:19  Ref. Range 07/19/2019 16:33  Sodium Latest Ref Range: 135 - 145 mEq/L 140  Potassium Latest Ref Range: 3.5 - 5.1 mEq/L 3.7  Chloride Latest Ref Range: 96 - 112 mEq/L 108  CO2 Latest Ref Range: 19 - 32 mEq/L 25  Glucose Latest Ref Range: 70 - 99 mg/dL 117 (H)  BUN Latest Ref Range: 6 - 23 mg/dL 19  Creatinine Latest Ref Range: 0.40 - 1.20 mg/dL 1.15  Calcium Latest Ref Range: 8.4 - 10.5 mg/dL 9.3  Alkaline Phosphatase Latest Ref Range: 39 - 117 U/L 70  Albumin Latest Ref Range: 3.5 - 5.2 g/dL 4.4  AST Latest Ref Range: 0 - 37 U/L 11  ALT Latest Ref Range: 0 - 35 U/L 8  Total Protein Latest Ref Range: 6.0 - 8.3 g/dL 6.3  Total Bilirubin Latest Ref Range: 0.2 - 1.2 mg/dL 0.4  GFR Latest Ref Range:  >60.00 mL/min 46.03 (L)  Total CHOL/HDL Ratio Unknown 3  Cholesterol Latest Ref Range: 0 - 200 mg/dL 146  HDL Cholesterol Latest Ref Range: >39.00 mg/dL 57.30  LDL (calc) Latest Ref Range: 0 - 99 mg/dL 49  NonHDL Unknown 88.29  Triglycerides Latest Ref Range: 0 - 149 mg/dL 194.0 (H)  VLDL Latest Ref Range: 0.0 - 40.0 mg/dL 38.8  VITD Latest Ref Range: 30.00 - 100.00 ng/mL 33.71  Vitamin B12 Latest Ref Range: 211 - 911 pg/mL 263  WBC Latest Ref Range: 4.0 - 10.5  K/uL 6.1  RBC Latest Ref Range: 3.87 - 5.11 Mil/uL 4.19  Hemoglobin Latest Ref Range: 12.0 - 15.0 g/dL 12.5  HCT Latest Ref Range: 36 - 46 % 36.1  MCV Latest Ref Range: 78.0 - 100.0 fl 86.1  MCHC Latest Ref Range: 30.0 - 36.0 g/dL 34.8  RDW Latest Ref Range: 11.5 - 15.5 % 14.2  Platelets Latest Ref Range: 150 - 400 K/uL 190.0  Neutrophils Latest Ref Range: 43 - 77 % 71.2  Lymphocytes Latest Ref Range: 12 - 46 % 16.6  Monocytes Relative Latest Ref Range: 3 - 12 % 7.3  Eosinophil Latest Ref Range: 0 - 5 % 4.1  Basophil Latest Ref Range: 0 - 3 % 0.8  NEUT# Latest Ref Range: 1.4 - 7.7 K/uL 4.3  Lymphocyte # Latest Ref Range: 0.7 - 4.0 K/uL 1.0  Monocyte # Latest Ref Range: 0 - 1 K/uL 0.4  Eosinophils Absolute Latest Ref Range: 0 - 0 K/uL 0.3  Basophils Absolute Latest Ref Range: 0 - 0 K/uL 0.0  TSH Latest Ref Range: 0.35 - 4.50 uIU/mL 2.89    Pathology report 01/13/2019  PARATHYROID, LEFT INFERIOR, PARATHYROIDECTOMY:  - Hypercellular parathyroid tissue (0.236 g)   B. THYROID, LEFT, LOBECTOMY:  - Noninvasive follicular thyroid neoplasm with papillary-like nuclear  features (NIFTP), 1.8 cm  - Margins are uninvolved by neoplasm    ASSESSMENT/PLAN/RECOMMENDATIONS:   1. 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 - These tumors are managed as neoplasms rather  then malignancy  - No TSH goal is needed with NIFTP . TSH 2.89 uIU/mL    2. Osteoporosis :  - Crystal Carroll with hx of non-compliance with Fosamax , this was mainly due to GERD symptoms - Will repeat DXA ( Crystal Carroll will schedule)  - We discussed if Crystal Carroll continues to show osteoporosis , I would recommend prolia for 3 weeks and proceed from there. Crystal Carroll in agreement of this  - Crystal Carroll consumes 2 servings of dairy daily  - Crystal Carroll is on 1000 iu Vitamin D3      F/u in 6 months     Signed electronically by: Mack Guise, MD  [facility] Endocrinology  [facility] [address] [place], [place] [address] Phone: [phone] FAX: [phone]   CC: Marin Olp, [place] [address] Phone: [phone] Fax: [phone]   Return to Endocrinology clinic as below: Future Appointments  Date Time Provider [place]  09/10/2019 11:10 AM Lalia Loudon, Melanie Crazier, MD [facility] None  10/21/2019  4:20 PM Marin Olp, MD [facility]  07/20/2020  2:40 PM Yong Channel Brayton Mars, MD [facility]

## 2019-10-13 ENCOUNTER — Telehealth: Payer: Self-pay | Admitting: Family Medicine

## 2019-10-13 NOTE — Chronic Care Management (AMB) (Signed)
°  Chronic Care Management   Note  10/13/2019 Name: Crystal Carroll MRN: [id number] DOB: 1945-02-28  Crystal Carroll is a 74 y.o. year old female who is a primary care patient of Marin Olp, MD. I reached out to [place] by phone today in response to a referral sent by Crystal Carroll's PCP, Marin Olp, MD.   Crystal Carroll was given information about Chronic Care Management services today including:  1. CCM service includes personalized support from designated clinical staff supervised by her physician, including individualized plan of care and coordination with other care providers 2. 24/7 contact phone numbers for assistance for urgent and routine care needs. 3. Service will only be billed when office clinical staff spend 20 minutes or more in a month to coordinate care. 4. Only one practitioner may furnish and bill the service in a calendar month. 5. The patient may stop CCM services at any time (effective at the end of the month) by phone call to the office staff.   Patient agreed to services and verbal consent obtained.   Follow up plan:   Lauretta Grill [employer] Scheduler

## 2019-10-17 ENCOUNTER — Other Ambulatory Visit: Payer: Self-pay | Admitting: Family Medicine

## 2019-10-20 NOTE — Progress Notes (Deleted)
Phone [phone] In person visit   Subjective:   Crystal Carroll is a 74 y.o. year old very pleasant female patient who presents for/with See problem oriented charting No chief complaint on file.   This visit occurred during the SARS-CoV-2 public health emergency.  Safety protocols were in place, including screening questions prior to the visit, additional usage of staff PPE, and extensive cleaning of exam room while observing appropriate contact time as indicated for disinfecting solutions.   Past Medical History-  Patient Active Problem List   Diagnosis Date Noted  . Osteoporosis without current pathological fracture 09/10/2019  . Mild aortic stenosis 07/19/2019  . History of lobectomy of thyroid 02/24/2019  . 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 Hyperparathyroidism, primary ([place]) 07/17/2018  . History of lacunar cerebrovascular accident (CVA) 06/18/2018  . Osteoporosis 03/13/2018  . Patellofemoral arthritis 03/05/2018  . DDD (degenerative disc disease), lumbar 03/05/2018  . Unstable gait 01/20/2018  . Aortic atherosclerosis ([place]) 06/12/2017  . Palpitations 05/12/2017  . Vitamin B12 deficiency 05/12/2017  . Cervical radiculopathy 04/03/2016  . Caregiver burden 04/03/2016  . Essential tremor 04/18/2015  . Adenomatous colon polyp 06/14/2014  . Heavy alcohol use 04/14/2014  . H/O cold sores 10/15/2013  . CKD (chronic kidney disease), stage III ([place]) 10/15/2013  . Former smoker 12/24/2006  . Hyperlipidemia 05/26/2006  . Anxiety state 05/26/2006  . Essential hypertension 05/26/2006  . GERD 05/26/2006    Medications- reviewed and updated Current Outpatient Medications  Medication Sig Dispense Refill  . ALPRAZolam (XANAX) 0.25 MG tablet TAKE 1 TABLET DAILY AS NEEDED FOR ANXIETY. 30 tablet 0  . aspirin EC 81 MG tablet Take 81 mg by mouth at bedtime.     Marland Kitchen buPROPion (WELLBUTRIN XL) 300 MG 24 hr tablet TAKE 1 TABLET  ONCE DAILY. 90 tablet 0  . Cholecalciferol (VITAMIN D) 50 MCG (2000 UT) CAPS Take 2,000 Units by mouth daily.     . citalopram (CELEXA) 20 MG tablet TAKE 1 TABLET ONCE DAILY. 90 tablet 0  . diclofenac sodium (VOLTAREN) 1 % GEL Apply 2 g topically 4 (four) times daily. (Patient taking differently: Apply 2 g topically 4 (four) times daily as needed (pain). ) 100 g 3  . pantoprazole (PROTONIX) 20 MG tablet TAKE 1 TABLET ONCE DAILY. 90 tablet 0  . rosuvastatin (CRESTOR) 40 MG tablet TAKE 1 TABLET ONCE DAILY. 90 tablet 0  . Turmeric 500 MG CAPS Take 500 mg by mouth daily.      No current facility-administered medications for this visit.     Objective:  LMP  (LMP Unknown)  Gen: NAD, resting comfortably CV: RRR no murmurs rubs or gallops Lungs: CTAB no crackles, wheeze, rhonchi Abdomen: soft/nontender/nondistended/normal bowel sounds. No rebound or guarding.  Ext: no edema Skin: warm, dry Neuro: grossly normal, moves all extremities  ***    Assessment and Plan  *** ***cpe 07/19/19 ***Medicare AWVS: 09/28/2018  #Hyperlipidemia/aortic atherosclerosis/ history of stroke- on aspirin S: Compliant with rosuvastatin 40 mg.  LDL goal at least under 100 -Aortic atherosclerosis noted CT 06/26/2017 for lung cancer screening.  Risk factor modification with blood pressure and lipid control plan. A/P:***   #Hypertension/CKD stage III S: Patient remains off lisinopril unfortunately blood pressure remains controlled***  -GFR has been stable largely in the 40s lately- in the past had been in the 30s A/P: ***   #Primary hyperparathyroidism status post parathyroidectomy January 2021 #History of thyroid nodule found to be noninvasive follicular neoplasm of  thyroid with papillary-like nuclear features now status post lobectomy of thyroid January 2021 S: Hyperparathyroidism likely led to osteoporosis-see discussion below.  Status post parathyroidectomy.  Follows with Dr. Kelton Pillar next visit August 2021***    Thyroid nodule removed at same time as parathyroidectomy A/P: ***   #Osteoporosis S: Patient was placed on Fosamax once a week in the past but current plan for treatment is parathyroidectomy in light of hyperparathyroidism/high calcium A/P: ***   #Unstable gait S: Started January 2020.  Stat head CT to rule out subdural hematoma.  Initially also seem to have some lower extremity weakness and numbness.  There was concern for lumbar stenosis.  Symptoms improved with time.  Ultimately did MRI of the brain to rule out prior cerebellar stroke-none was detected.  Did have prior lacunar stroke and patient was placed back on aspirin.  Patient later saw Dr. Tomi Likens July 28, 2018-MRI of cervical spine without contrast was ordered.  There were some disc bulges and MRI of lumbar spine was also ordered September 2020-this showed spinal stenosis which was thought to be contributing to leg pain and balance issues.  Neurology offered referral to neurosurgery orthopedics***  A/P: ***    #GERD S: Compliant with Protonix 40 mg-->20mg  and had to add pepcid with dinner A/P: ***    #Anxiety/stress management S: Compliant with Celexa 20 mg, Wellbutrin 300 mg extended release.  Also takes sparing Xanax as needed for anxiety A/P: ***   No problem-specific Assessment & Plan notes found for this encounter.   Recommended follow up: ***No follow-ups on file. Future Appointments  Date Time Provider [place]  10/21/2019  4:20 PM Marin Olp, MD [facility]  12/20/2019  2:00 PM [facility] CCM PHARMACIST [facility]  03/10/2020  1:00 PM Shamleffer, Melanie Crazier, MD [facility] None  07/20/2020  2:40 PM Yong Channel Brayton Mars, MD [facility]    Lab/Order associations: No diagnosis found.  No orders of the defined types were placed in this encounter.   Time Spent: *** minutes of total time (4:27 PM***- 4:27 PM***) was spent on the date of the encounter performing the following actions: chart  review prior to seeing the patient, obtaining history, performing a medically necessary exam, counseling on the treatment plan, placing orders, and documenting in our EHR.   Return precautions advised.  Clyde Lundborg, CMA

## 2019-10-21 ENCOUNTER — Telehealth: Payer: Self-pay | Admitting: *Deleted

## 2019-10-21 ENCOUNTER — Ambulatory Visit: Payer: PPO | Admitting: Family Medicine

## 2019-10-21 DIAGNOSIS — F411 Generalized anxiety disorder: Secondary | ICD-10-CM

## 2019-10-21 DIAGNOSIS — K219 Gastro-esophageal reflux disease without esophagitis: Secondary | ICD-10-CM

## 2019-10-21 DIAGNOSIS — I10 Essential (primary) hypertension: Secondary | ICD-10-CM

## 2019-10-21 DIAGNOSIS — E785 Hyperlipidemia, unspecified: Secondary | ICD-10-CM

## 2019-10-21 DIAGNOSIS — E538 Deficiency of other specified B group vitamins: Secondary | ICD-10-CM

## 2019-10-21 NOTE — Telephone Encounter (Signed)
Left voice message to patient. You had an appointment with [place] today. Dr Yong Channel want to check make sure everything was good, since you did not show up for your appointment. Please give [place] a call back

## 2019-10-29 ENCOUNTER — Ambulatory Visit (INDEPENDENT_AMBULATORY_CARE_PROVIDER_SITE_OTHER): Payer: PPO

## 2019-10-29 ENCOUNTER — Other Ambulatory Visit: Payer: Self-pay

## 2019-10-29 DIAGNOSIS — Z0000 Encounter for general adult medical examination without abnormal findings: Secondary | ICD-10-CM

## 2019-10-29 DIAGNOSIS — Z86010 Personal history of colonic polyps: Secondary | ICD-10-CM | POA: Diagnosis not present

## 2019-10-29 DIAGNOSIS — Z1211 Encounter for screening for malignant neoplasm of colon: Secondary | ICD-10-CM

## 2019-10-29 NOTE — Progress Notes (Signed)
Subjective:   Michaelina A Lank is a 74 y.o. female who presents for Medicare Annual (Subsequent) preventive examination.  Review of Systems     Cardiac Risk Factors include: advanced age (>68men, >75 women);dyslipidemia;hypertension     Objective:    There were no vitals filed for this visit. There is no height or weight on file to calculate BMI.  Advanced Directives 10/29/2019 01/13/2019 01/13/2019 01/06/2019 09/28/2018 07/28/2018 06/24/2018  Does Patient Have a Medical Advance Directive? Yes Yes Yes Yes Yes Yes Yes  Type of Paramedic of [place];Living will Living will [place];Living will [place];Living will [place];Living will [place];Living will [place];Living will  Does patient want to make changes to medical advance directive? - - No - Patient declined No - Patient declined No - Patient declined No - Patient declined No - Patient declined  Copy of [place] in Chart? Yes - validated most recent copy scanned in chart (See row information) - No - copy requested No - copy requested Yes - validated most recent copy scanned in chart (See row information) No - copy requested No - copy requested  Would patient like information on creating a medical advance directive? - No - Patient declined No - Patient declined - - - -    Current Medications (verified) Outpatient Encounter Medications as of 10/29/2019  Medication Sig  . ALPRAZolam (XANAX) 0.25 MG tablet TAKE 1 TABLET DAILY AS NEEDED FOR ANXIETY.  Marland Kitchen aspirin EC 81 MG tablet Take 81 mg by mouth at bedtime.   Marland Kitchen buPROPion (WELLBUTRIN XL) 300 MG 24 hr tablet TAKE 1 TABLET ONCE DAILY.  . citalopram (CELEXA) 20 MG tablet TAKE 1 TABLET ONCE DAILY.  Marland Kitchen diclofenac sodium (VOLTAREN) 1 % GEL Apply 2 g topically 4 (four) times daily.  . pantoprazole (PROTONIX) 20 MG tablet TAKE 1 TABLET ONCE DAILY.  .  rosuvastatin (CRESTOR) 40 MG tablet TAKE 1 TABLET ONCE DAILY.  . vitamin B-12 (CYANOCOBALAMIN) 500 MCG tablet Take 500 mcg by mouth daily.  Marland Kitchen VITAMIN D PO Take by mouth. Vit D3  . [DISCONTINUED] Cholecalciferol (VITAMIN D) 50 MCG (2000 UT) CAPS Take 2,000 Units by mouth daily.   . Turmeric 500 MG CAPS Take 500 mg by mouth daily.  (Patient not taking: Reported on 10/29/2019)   No facility-administered encounter medications on file as of 10/29/2019.    Allergies (verified) Codeine and Sulfamethoxazole   History: Past Medical History:  Diagnosis Date  . Anxiety   . Aortic atherosclerosis ([place])   . Aortic valvar stenosis 11/2018   Mild, noted on ECHO  . Arthritis    cervical neck  . CKD (chronic kidney disease), stage III (HCC)    Stable  . GERD (gastroesophageal reflux disease)   . History of TIA (transient ischemic attack)    per patient, didn't know about it  . Hyperlipidemia   . Hyperparathyroidism ([place])   . Hypertension    History of   . Junctional rhythm   . Osteoporosis   . Palpitations   . Thyroid nodule    2.0 cm solid nodule in the inferior left thyroid lobe    Past Surgical History:  Procedure Laterality Date  . CERVICAL LAMINECTOMY  2004   [facility] Gell  . COLONOSCOPY    . ESOPHAGOGASTRODUODENOSCOPY    . PARATHYROIDECTOMY Left 01/13/2019   Procedure: LEFT PARATHYROIDECTOMY;  Surgeon: Armandina Gemma, MD;  Location: [facility];  Service: General;  Laterality: Left;  . THYROID LOBECTOMY Left 01/13/2019   Procedure: LEFT THYROID LOBECTOMY;  Surgeon: Armandina Gemma, MD;  Location: [facility];  Service: General;  Laterality: Left;   Family History  Problem Relation Age of Onset  . Heart attack Father 65       smoker, rheumatic fever as child  . Melanoma Mother 52       labia   Social History   Socioeconomic History  . Marital status: Married    Spouse name: Leroy Sea  . Number of children: 1  . Years of education: 46  . Highest education level: Associate degree: academic  program  Occupational History  . Occupation: Retired  Tobacco Use  . Smoking status: Former Smoker    Packs/day: 0.75    Years: 50.00    Pack years: 37.50    Types: Cigarettes    Quit date: 03/07/2013    Years since quitting: 6.6  . Smokeless tobacco: Never Used  Vaping Use  . Vaping Use: Never used  Substance and Sexual Activity  . Alcohol use: Yes    Alcohol/week: 0.0 standard drinks    Comment: 2 per day  . Drug use: No  . Sexual activity: Not on file  Other Topics Concern  . Not on file  Social History Narrative   Married to Kelsey [facility] (patient) in 1981. 2nd marriage-1 child with 1 adopted grandchild.       Retired as Statistician.       Hobbies: antiques, former Firefighter      Exercise: none currently.       Patient is right-handed. She lives with her husband in a two level home. She does not exercise.      Social Determinants of Health   Financial Resource Strain: Low Risk   . Difficulty of Paying Living Expenses: Not hard at all  Food Insecurity: No Food Insecurity  . Worried About Charity fundraiser in the Last Year: Never true  . Ran Out of Food in the Last Year: Never true  Transportation Needs: No Transportation Needs  . Lack of Transportation (Medical): No  . Lack of Transportation (Non-Medical): No  Physical Activity: Inactive  . Days of Exercise per Week: 0 days  . Minutes of Exercise per Session: 0 min  Stress: No Stress Concern Present  . Feeling of Stress : Not at all  Social Connections: Moderately Isolated  . Frequency of Communication with Friends and Family: More than three times a week  . Frequency of Social Gatherings with Friends and Family: More than three times a week  . Attends Religious Services: Never  . Active Member of Clubs or Organizations: No  . Attends Archivist Meetings: Never  . Marital Status: Married    Tobacco Counseling Counseling given: Not Answered   Clinical Intake:  Pre-visit  preparation completed: Yes  Pain : No/denies pain     BMI - recorded: 26.16 Nutritional Status: BMI 25 -29 Overweight Diabetes: No  How often do you need to have someone help you when you read instructions, pamphlets, or other written materials from your doctor or pharmacy?: 1 - Never  Diabetic?No  Interpreter Needed?: No  Information entered by :: Charlott Rakes, LPN   Activities of Daily Living In your present state of health, do you have any difficulty performing the following activities: 10/29/2019 01/13/2019  Hearing? N -  Vision? N -  Difficulty concentrating or making decisions? N -  Walking or  climbing stairs? N -  Comment - -  Dressing or bathing? N -  Doing errands, shopping? N N  Preparing Food and eating ? N -  Using the Toilet? N -  In the past six months, have you accidently leaked urine? N -  Do you have problems with loss of bowel control? N -  Managing your Medications? N -  Managing your Finances? N -  Housekeeping or managing your Housekeeping? N -  Some recent data might be hidden    Patient Care Team: Marin Olp, MD as PCP - General (Family Medicine) Pieter Partridge, DO as Consulting Physician (Neurology) Juluis Rainier as Consulting Physician (Optometry) Shamleffer, Melanie Crazier, MD as Consulting Physician (Endocrinology) Armandina Gemma, MD as Consulting Physician (General Surgery) Madelin Rear, [facility] as Pharmacist (Pharmacist)  Indicate any recent Medical Services you may have received from other than [facility] providers in the past year (date may be approximate).     Assessment:   This is a routine wellness examination for K[place]liyah.  Hearing/Vision screen  Hearing Screening   125Hz  250Hz  500Hz  1000Hz  2000Hz  3000Hz  4000Hz  6000Hz  8000Hz   Right ear:           Left ear:           Comments: Pt states no difficulty hearing  Vision Screening Comments: Follows up with Dr Alois Cliche for annual eye care  Dietary issues and exercise activities  discussed: Current Exercise Habits: The patient does not participate in regular exercise at present  Goals    . Patient Stated     Lose weight      Depression Screen PHQ 2/9 Scores 10/29/2019 07/19/2019 04/15/2019 10/29/2018 09/28/2018 03/13/2018 08/12/2017  PHQ - 2 Score 0 0 0 0 0 0 0  PHQ- 9 Score - 0 0 3 - - -    Fall Risk Fall Risk  10/29/2019 07/19/2019 04/15/2019 12/15/2018 10/29/2018  Falls in the past year? 0 0 0 0 0  Number falls in past yr: 0 0 0 0 0  Injury with Fall? 0 0 0 0 0  Risk for fall due to : Impaired vision;Impaired balance/gait - - - -  Follow up Falls prevention discussed - - - -    Any stairs in or around the home? No  If so, are there any without handrails? No  Home free of loose throw rugs in walkways, pet beds, electrical cords, etc? Yes  Adequate lighting in your home to reduce risk of falls? Yes   ASSISTIVE DEVICES UTILIZED TO PREVENT FALLS:  Life alert? No  Use of a cane, walker or w/c? No  Grab bars in the bathroom? Yes  Shower chair or bench in shower? Yes  Elevated toilet seat or a handicapped toilet? Yes   TIMED UP AND GO:  Was the test performed? No .       Cognitive Function:     6CIT Screen 10/29/2019 09/28/2018  What Year? 0 points 0 points  What month? 0 points 0 points  What time? - 0 points  Count back from 20 0 points 0 points  Months in reverse 0 points 0 points  Repeat phrase 0 points 0 points  Total Score - 0    Immunizations Immunization History  Administered Date(s) Administered  . Fluad Quad(high Dose 65+) 09/28/2018  . Influenza Split 12/03/2010, 12/09/2011  . Influenza Whole 10/18/2009  . Influenza, High Dose Seasonal PF 11/02/2012, 10/18/2015, 10/21/2016, 10/21/2017  . Influenza,inj,Quad PF,6+ Mos 10/15/2013, 12/06/2014  .  Influenza-Unspecified 10/08/2015  . PFIZER SARS-COV-2 Vaccination 01/28/2019, 02/18/2019  . Pneumococcal Conjugate-13 04/15/2014  . Pneumococcal Polysaccharide-23 12/03/2010  . Td 01/07/1998,  10/18/2009  . Tdap 10/18/2016  . Zoster 10/18/2009  . Zoster Recombinat (Shingrix) 11/04/2018    TDAP status: Up to date Flu Vaccine pt will schedule appt for November Pneumococcal vaccine status: Up to date Covid-19 vaccine status: Completed vaccines  Qualifies for Shingles Vaccine? Yes   Zostavax completed Yes   Shingrix Completed?: Yes  Screening Tests Health Maintenance  Topic Date Due  . COLONOSCOPY  05/31/2019  . INFLUENZA VACCINE  08/08/2019  . TETANUS/TDAP  10/19/2026  . DEXA SCAN  Completed  . COVID-19 Vaccine  Completed  . Hepatitis C Screening  Completed  . PNA vac Low Risk Adult  Completed    Health Maintenance  Health Maintenance Due  Topic Date Due  . COLONOSCOPY  05/31/2019  . INFLUENZA VACCINE  08/08/2019    Colorectal cancer screening: Completed 05/31/14. Repeat every 5 years Mammogram status: Completed 07/13/19. Repeat every year Bone Density status: Completed 05/19/17. Results reflect: Bone density results: OSTEOPOROSIS. Repeat every 2 years.   Additional Screening:  Hepatitis C Screening: Completed 12/07/14  Vision Screening: Recommended annual ophthalmology exams for early detection of glaucoma and other disorders of the eye. Is the patient up to date with their annual eye exam?  Yes  Who is the provider or what is the name of the office in which the patient attends annual eye exams? Dr Alois Cliche   Dental Screening: Recommended annual dental exams for proper oral hygiene  Community Resource Referral / Chronic Care Management: CRR required this visit?  No   CCM required this visit?  No      Plan:     I have personally reviewed and noted the following in the patient's chart:   . Medical and social history . Use of alcohol, tobacco or illicit drugs  . Current medications and supplements . Functional ability and status . Nutritional status . Physical activity . Advanced directives . List of other physicians . Hospitalizations,  surgeries, and ER visits in previous 12 months . Vitals . Screenings to include cognitive, depression, and falls . Referrals and appointments  In addition, I have reviewed and discussed with patient certain preventive protocols, quality metrics, and best practice recommendations. A written personalized care plan for preventive services as well as general preventive health recommendations were provided to patient.     Willette Brace, LPN   [id number]   Nurse Notes: None

## 2019-10-29 NOTE — Patient Instructions (Addendum)
Crystal Carroll , Thank you for taking time to come for your Medicare Wellness Visit. I appreciate your ongoing commitment to your health goals. Please review the following plan we discussed and let me know if I can assist you in the future.   Screening recommendations/referrals: Colonoscopy: Order placed 10/29/19 Mammogram: Done 07/13/19 Bone Density: Done 05/19/17 Recommended yearly ophthalmology/optometry visit for glaucoma screening and checkup Recommended yearly dental visit for hygiene and checkup  Vaccinations: Influenza vaccine: Due and discussed pt will make an appt Novemeber Pneumococcal vaccine: Up to date Tdap vaccine: Up to date Shingles vaccine: Done 11/04/18 Covid-19:Done 1/21 & 02/18/19  Advanced directives: Copy in chart   Conditions/risks identified: lose weight   Next appointment: Follow up in one year for your annual wellness visit    Preventive Care 64 Years and Older, Female Preventive care refers to lifestyle choices and visits with your health care provider that can promote health and wellness. What does preventive care include?  A yearly physical exam. This is also called an annual well check.  Dental exams once or twice a year.  Routine eye exams. Ask your health care provider how often you should have your eyes checked.  Personal lifestyle choices, including:  Daily care of your teeth and gums.  Regular physical activity.  Eating a healthy diet.  Avoiding tobacco and drug use.  Limiting alcohol use.  Practicing safe sex.  Taking low-dose aspirin every day.  Taking vitamin and mineral supplements as recommended by your health care provider. What happens during an annual well check? The services and screenings done by your health care provider during your annual well check will depend on your age, overall health, lifestyle risk factors, and family history of disease. Counseling  Your health care provider may ask you questions about your:  Alcohol  use.  Tobacco use.  Drug use.  Emotional well-being.  Home and relationship well-being.  Sexual activity.  Eating habits.  History of falls.  Memory and ability to understand (cognition).  Work and work Statistician.  Reproductive health. Screening  You may have the following tests or measurements:  Height, weight, and BMI.  Blood pressure.  Lipid and cholesterol levels. These may be checked every 5 years, or more frequently if you are over 73 years old.  Skin check.  Lung cancer screening. You may have this screening every year starting at age 13 if you have a 30-pack-year history of smoking and currently smoke or have quit within the past 15 years.  Fecal occult blood test (FOBT) of the stool. You may have this test every year starting at age 52.  Flexible sigmoidoscopy or colonoscopy. You may have a sigmoidoscopy every 5 years or a colonoscopy every 10 years starting at age 34.  Hepatitis C blood test.  Hepatitis B blood test.  Sexually transmitted disease (STD) testing.  Diabetes screening. This is done by checking your blood sugar (glucose) after you have not eaten for a while (fasting). You may have this done every 1-3 years.  Bone density scan. This is done to screen for osteoporosis. You may have this done starting at age 47.  Mammogram. This may be done every 1-2 years. Talk to your health care provider about how often you should have regular mammograms. Talk with your health care provider about your test results, treatment options, and if necessary, the need for more tests. Vaccines  Your health care provider may recommend certain vaccines, such as:  Influenza vaccine. This is recommended every year.  Tetanus, diphtheria, and acellular pertussis (Tdap, Td) vaccine. You may need a Td booster every 10 years.  Zoster vaccine. You may need this after age 11.  Pneumococcal 13-valent conjugate (PCV13) vaccine. One dose is recommended after age  6.  Pneumococcal polysaccharide (PPSV23) vaccine. One dose is recommended after age 69. Talk to your health care provider about which screenings and vaccines you need and how often you need them. This information is not intended to replace advice given to you by your health care provider. Make sure you discuss any questions you have with your health care provider. Document Released: 01/20/2015 Document Revised: 09/13/2015 Document Reviewed: 10/25/2014 Elsevier Interactive Patient Education  2017 [place] Prevention in the Home Falls can cause injuries. They can happen to people of all ages. There are many things you can do to make your home safe and to help prevent falls. What can I do on the outside of my home?  Regularly fix the edges of walkways and driveways and fix any cracks.  Remove anything that might make you trip as you walk through a door, such as a raised step or threshold.  Trim any bushes or trees on the path to your home.  Use bright outdoor lighting.  Clear any walking paths of anything that might make someone trip, such as rocks or tools.  Regularly check to see if handrails are loose or broken. Make sure that both sides of any steps have handrails.  Any raised decks and porches should have guardrails on the edges.  Have any leaves, snow, or ice cleared regularly.  Use sand or salt on walking paths during winter.  Clean up any spills in your garage right away. This includes oil or grease spills. What can I do in the bathroom?  Use night lights.  Install grab bars by the toilet and in the tub and shower. Do not use towel bars as grab bars.  Use non-skid mats or decals in the tub or shower.  If you need to sit down in the shower, use a plastic, non-slip stool.  Keep the floor dry. Clean up any water that spills on the floor as soon as it happens.  Remove soap buildup in the tub or shower regularly.  Attach bath mats securely with double-sided  non-slip rug tape.  Do not have throw rugs and other things on the floor that can make you trip. What can I do in the bedroom?  Use night lights.  Make sure that you have a light by your bed that is easy to reach.  Do not use any sheets or blankets that are too big for your bed. They should not hang down onto the floor.  Have a firm chair that has side arms. You can use this for support while you get dressed.  Do not have throw rugs and other things on the floor that can make you trip. What can I do in the kitchen?  Clean up any spills right away.  Avoid walking on wet floors.  Keep items that you use a lot in easy-to-reach places.  If you need to reach something above you, use a strong step stool that has a grab bar.  Keep electrical cords out of the way.  Do not use floor polish or wax that makes floors slippery. If you must use wax, use non-skid floor wax.  Do not have throw rugs and other things on the floor that can make you trip. What can I do  with my stairs?  Do not leave any items on the stairs.  Make sure that there are handrails on both sides of the stairs and use them. Fix handrails that are broken or loose. Make sure that handrails are as long as the stairways.  Check any carpeting to make sure that it is firmly attached to the stairs. Fix any carpet that is loose or worn.  Avoid having throw rugs at the top or bottom of the stairs. If you do have throw rugs, attach them to the floor with carpet tape.  Make sure that you have a light switch at the top of the stairs and the bottom of the stairs. If you do not have them, ask someone to add them for you. What else can I do to help prevent falls?  Wear shoes that:  Do not have high heels.  Have rubber bottoms.  Are comfortable and fit you well.  Are closed at the toe. Do not wear sandals.  If you use a stepladder:  Make sure that it is fully opened. Do not climb a closed stepladder.  Make sure that both  sides of the stepladder are locked into place.  Ask someone to hold it for you, if possible.  Clearly mark and make sure that you can see:  Any grab bars or handrails.  First and last steps.  Where the edge of each step is.  Use tools that help you move around (mobility aids) if they are needed. These include:  Canes.  Walkers.  Scooters.  Crutches.  Turn on the lights when you go into a dark area. Replace any light bulbs as soon as they burn out.  Set up your furniture so you have a clear path. Avoid moving your furniture around.  If any of your floors are uneven, fix them.  If there are any pets around you, be aware of where they are.  Review your medicines with your doctor. Some medicines can make you feel dizzy. This can increase your chance of falling. Ask your doctor what other things that you can do to help prevent falls. This information is not intended to replace advice given to you by your health care provider. Make sure you discuss any questions you have with your health care provider. Document Released: 10/20/2008 Document Revised: 06/01/2015 Document Reviewed: 01/28/2014 Elsevier Interactive Patient Education  2017 [employer].

## 2019-11-10 ENCOUNTER — Telehealth: Payer: Self-pay

## 2019-11-10 DIAGNOSIS — R269 Unspecified abnormalities of gait and mobility: Secondary | ICD-10-CM

## 2019-11-10 NOTE — Telephone Encounter (Signed)
Referral has been placed. 

## 2019-11-10 NOTE — Telephone Encounter (Signed)
Pt is requesting that her Physical Therapy referral be sent to the facility she is living at, so she can do therapy there.     [place] at [facility] [phone]

## 2019-11-16 ENCOUNTER — Telehealth (INDEPENDENT_AMBULATORY_CARE_PROVIDER_SITE_OTHER): Payer: PPO | Admitting: Family Medicine

## 2019-11-16 ENCOUNTER — Other Ambulatory Visit: Payer: Self-pay

## 2019-11-16 DIAGNOSIS — R0981 Nasal congestion: Secondary | ICD-10-CM

## 2019-11-16 DIAGNOSIS — R059 Cough, unspecified: Secondary | ICD-10-CM

## 2019-11-16 DIAGNOSIS — R067 Sneezing: Secondary | ICD-10-CM

## 2019-11-16 MED ORDER — BENZONATATE 100 MG PO CAPS: 100.0000 mg | ORAL_CAPSULE | Freq: Three times a day (TID) | ORAL | 0 refills | Status: DC | PRN

## 2019-11-16 NOTE — Progress Notes (Signed)
Virtual Visit via Telephone Note  I connected with Tarah A Herrman on 11/16/19 at 10:20 AM EST by telephone and verified that I am speaking with the correct person using two identifiers.   I discussed the limitations, risks, security and privacy concerns of performing an evaluation and management service by telephone and the availability of in person appointments. I also discussed with the patient that there may be a patient responsible charge related to this service. The patient expressed understanding and agreed to proceed.  Location patient: home, [place] Location provider: work or home office Participants present for the call: patient, provider Patient did not have a visit in the prior 7 days to address this/these issue(s).   History of Present Illness:  Acute telemedicine visit for scratchy throat: -Onset: 3 days ago -Symptoms include: nasal congestion, scratchy throat, sneezing, mild cough -Denies: fevers, body aches, NVD, SOB, CP -no known sick contacts -Has tried: nothing -Pertinent past medical history: gerd -Pertinent medication allergies: sulfa, codeine -COVID-19 vaccine status: fully vaccinated -needs flu shot   Observations/Objective: Patient sounds cheerful and well on the phone. I do not appreciate any SOB. Speech and thought processing are grossly intact. Patient reported vitals:  Assessment and Plan:  Cough  Nasal congestion  Sneezing  -we discussed possible serious and likely etiologies, options for evaluation and workup, limitations of telemedicine visit vs in person visit, treatment, treatment risks and precautions. Pt prefers to treat via telemedicine empirically rather than in person at this moment.  She recently got new puppy and moved, so it is possible that allergies are causing some of this.  We also discussed possibility of viral upper respiratory infection versus other.  Opted for treatment with nasal saline twice daily, a children's half dose of Zyrtec  once daily (as she is sensitive to medications) and Tessalon prescription for cough, sent to her pharmacy.  She plans to get tested for Covid in 2 days.  We also talked about vaccines and get counseling on the flu vaccine and Covid vaccine per her questions. Work/School slipped offered: Not applicable Scheduled follow up with PCP offered: She agrees to call if needed. Advised to seek prompt in person care if worsening, new symptoms arise, or if is not improving with treatment. Advised of options for inperson care in case PCP office not available. Did let the patient know that I only do telemedicine shifts for [place] on Tuesdays and Thursdays and advised a follow up visit with PCP or at an [facility] if has further questions or concerns.   Follow Up Instructions:  I did not refer this patient for an OV in the next 24 hours for this/these issue(s).  I discussed the assessment and treatment plan with the patient. The patient was provided an opportunity to ask questions and all were answered. The patient agreed with the plan and demonstrated an understanding of the instructions.   I spent 19 minutes on this encounter.   Lucretia Kern, DO

## 2019-11-16 NOTE — Patient Instructions (Addendum)
-  I sent the medication(s) we discussed to your pharmacy: Meds ordered this encounter  Medications  . benzonatate (TESSALON PERLES) 100 MG capsule    Sig: Take 1 capsule (100 mg total) by mouth 3 (three) times daily as needed.    Dispense:  20 capsule    Refill:  0   - 5 mg of children's Zyrtec once daily for 2 weeks  -Nasal saline twice daily   I hope you are feeling better soon!  Follow-up with your primary care doctor or seek in person care promptly if your symptoms worsen, new concerns arise or you are not improving with treatment.  It was nice to meet you today. I help [facility] out with telemedicine visits on Tuesdays and Thursdays and am available for visits on those days. If you have any concerns or questions following this visit please schedule a follow up visit with your Primary Care doctor or seek care at a local urgent care clinic to avoid delays in care.

## 2019-11-19 DIAGNOSIS — M25561 Pain in right knee: Secondary | ICD-10-CM | POA: Diagnosis not present

## 2019-11-19 DIAGNOSIS — M6281 Muscle weakness (generalized): Secondary | ICD-10-CM | POA: Diagnosis not present

## 2019-11-19 DIAGNOSIS — M5459 Other low back pain: Secondary | ICD-10-CM | POA: Diagnosis not present

## 2019-11-19 DIAGNOSIS — M25562 Pain in left knee: Secondary | ICD-10-CM | POA: Diagnosis not present

## 2019-11-22 DIAGNOSIS — M5459 Other low back pain: Secondary | ICD-10-CM | POA: Diagnosis not present

## 2019-11-22 DIAGNOSIS — M25562 Pain in left knee: Secondary | ICD-10-CM | POA: Diagnosis not present

## 2019-11-22 DIAGNOSIS — M25561 Pain in right knee: Secondary | ICD-10-CM | POA: Diagnosis not present

## 2019-11-22 DIAGNOSIS — M6281 Muscle weakness (generalized): Secondary | ICD-10-CM | POA: Diagnosis not present

## 2019-11-24 DIAGNOSIS — M5459 Other low back pain: Secondary | ICD-10-CM | POA: Diagnosis not present

## 2019-11-24 DIAGNOSIS — M6281 Muscle weakness (generalized): Secondary | ICD-10-CM | POA: Diagnosis not present

## 2019-11-24 DIAGNOSIS — M25561 Pain in right knee: Secondary | ICD-10-CM | POA: Diagnosis not present

## 2019-11-24 DIAGNOSIS — M25562 Pain in left knee: Secondary | ICD-10-CM | POA: Diagnosis not present

## 2019-12-06 DIAGNOSIS — M5459 Other low back pain: Secondary | ICD-10-CM | POA: Diagnosis not present

## 2019-12-06 DIAGNOSIS — M6281 Muscle weakness (generalized): Secondary | ICD-10-CM | POA: Diagnosis not present

## 2019-12-06 DIAGNOSIS — M25561 Pain in right knee: Secondary | ICD-10-CM | POA: Diagnosis not present

## 2019-12-06 DIAGNOSIS — M25562 Pain in left knee: Secondary | ICD-10-CM | POA: Diagnosis not present

## 2019-12-08 DIAGNOSIS — M25562 Pain in left knee: Secondary | ICD-10-CM | POA: Diagnosis not present

## 2019-12-08 DIAGNOSIS — M6281 Muscle weakness (generalized): Secondary | ICD-10-CM | POA: Diagnosis not present

## 2019-12-08 DIAGNOSIS — M5459 Other low back pain: Secondary | ICD-10-CM | POA: Diagnosis not present

## 2019-12-08 DIAGNOSIS — M25561 Pain in right knee: Secondary | ICD-10-CM | POA: Diagnosis not present

## 2019-12-19 ENCOUNTER — Other Ambulatory Visit: Payer: Self-pay | Admitting: Family Medicine

## 2019-12-20 ENCOUNTER — Telehealth: Payer: Self-pay

## 2019-12-20 ENCOUNTER — Ambulatory Visit: Payer: PPO

## 2019-12-20 NOTE — Telephone Encounter (Cosign Needed)
  Chronic Care Management   Outreach Note   Name: Crystal Carroll MRN: [id number] DOB: 1945-04-18  Referred by: Marin Olp, MD Reason for referral: Office visit with [facility] Clinical Pharmacist, Madelin Rear.   An unsuccessful telephone outreach was attempted today. The patient was referred to the pharmacist for assistance with care management and care coordination.   In office appointment with clinical pharmacist today (12/20/2019) at 2 pm. If patient immediately returns call, transfer to [phone]. Otherwise, please provide number below so patient can reschedule visit.   Madelin Rear, Pharm.D., BCGP Clinical Pharmacist [facility] [phone]

## 2019-12-20 NOTE — Progress Notes (Deleted)
Chronic Care Management Pharmacy Name: Crystal Carroll     MRN: [id number]     DOB: [birth year]  Chief Complaint/ HPI Crystal Carroll, 74 y.o., female, presents for their {initial or follow up:24811::"initial"} CCM visit with the clinical pharmacist {in office or via telephone:24812::"via telephone due to COVID-19 pandemic"}.  PCP : Marin Olp, MD Encounter Diagnoses  Name Primary?  . Hyperlipidemia, unspecified hyperlipidemia type Yes  . Essential hypertension   . Osteoporosis without current pathological fracture, unspecified osteoporosis type     Office Visits:  07/19/2019 (PCP): weight slightly increased, hx of stroke. b12 low - restart supplement.   Consult Visit: 09/10/2019 (Dr Kelton Pillar): no tsh goal needed with NIFTP. Repeat DXA, if continued OP, will consider starting prolia for 3 weeks. Consumes 2 servings dairy/day, on d3 1000 units daily. F/u in 6 months  Patient Active Problem List   Diagnosis Date Noted  . Osteoporosis without current pathological fracture 09/10/2019  . Mild aortic stenosis 07/19/2019  . History of lobectomy of thyroid 02/24/2019  . 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 Hyperparathyroidism, primary ([place]) 07/17/2018  . History of lacunar cerebrovascular accident (CVA) 06/18/2018  . Osteoporosis 03/13/2018  . Patellofemoral arthritis 03/05/2018  . DDD (degenerative disc disease), lumbar 03/05/2018  . Unstable gait 01/20/2018  . Aortic atherosclerosis ([place]) 06/12/2017  . Palpitations 05/12/2017  . Vitamin B12 deficiency 05/12/2017  . Cervical radiculopathy 04/03/2016  . Caregiver burden 04/03/2016  . Essential tremor 04/18/2015  . Adenomatous colon polyp 06/14/2014  . Heavy alcohol use 04/14/2014  . H/O cold sores 10/15/2013  . CKD (chronic kidney disease), stage III ([place]) 10/15/2013  . Former smoker 12/24/2006  . Hyperlipidemia 05/26/2006  . Anxiety state 05/26/2006  . Essential  hypertension 05/26/2006  . GERD 05/26/2006   Past Surgical History:  Procedure Laterality Date  . CERVICAL LAMINECTOMY  2004   Earle Gell  . COLONOSCOPY    . ESOPHAGOGASTRODUODENOSCOPY    . PARATHYROIDECTOMY Left 01/13/2019   Procedure: LEFT PARATHYROIDECTOMY;  Surgeon: Armandina Gemma, MD;  Location: [facility];  Service: General;  Laterality: Left;  . THYROID LOBECTOMY Left 01/13/2019   Procedure: LEFT THYROID LOBECTOMY;  Surgeon: Armandina Gemma, MD;  Location: [facility];  Service: General;  Laterality: Left;   Family History  Problem Relation Age of Onset  . Heart attack Father 62       smoker, rheumatic fever as child  . Melanoma Mother 23       labia   Allergies  Allergen Reactions  . Codeine Nausea Only  . Sulfamethoxazole     REACTION: GI upset   Outpatient Encounter Medications as of 12/20/2019  Medication Sig  . ALPRAZolam (XANAX) 0.25 MG tablet TAKE 1 TABLET DAILY AS NEEDED FOR ANXIETY.  Marland Kitchen aspirin EC 81 MG tablet Take 81 mg by mouth at bedtime.   . benzonatate (TESSALON PERLES) 100 MG capsule Take 1 capsule (100 mg total) by mouth 3 (three) times daily as needed.  Marland Kitchen buPROPion (WELLBUTRIN XL) 300 MG 24 hr tablet TAKE 1 TABLET ONCE DAILY.  . citalopram (CELEXA) 20 MG tablet TAKE 1 TABLET ONCE DAILY.  Marland Kitchen diclofenac sodium (VOLTAREN) 1 % GEL Apply 2 g topically 4 (four) times daily.  . pantoprazole (PROTONIX) 20 MG tablet TAKE 1 TABLET ONCE DAILY.  . rosuvastatin (CRESTOR) 40 MG tablet TAKE 1 TABLET ONCE DAILY.  . Turmeric 500 MG CAPS Take 500 mg by mouth daily.  (Patient not taking: Reported on 10/29/2019)  .  vitamin B-12 (CYANOCOBALAMIN) 500 MCG tablet Take 500 mcg by mouth daily.  Marland Kitchen VITAMIN D PO Take by mouth. Vit D3   No facility-administered encounter medications on file as of 12/20/2019.   Patient Care Team    Relationship Specialty Notifications Start End  Marin Olp, MD PCP - General Family Medicine  10/18/13    Comment: Gladis Riffle, DO Consulting  Physician Neurology  09/28/18   Dunn, Godwin Physician Optometry  09/28/18   Shamleffer, Melanie Crazier, MD Consulting Physician Endocrinology  09/28/18   Armandina Gemma, MD Consulting Physician General Surgery  02/05/19   Madelin Rear, [facility] Pharmacist Pharmacist  10/13/19    Comment: [phone]   Current Diagnosis/Assessment: Goals Addressed   None    Hypertension   BP goal <130/80  BP Readings from Last 3 Encounters:  09/10/19 110/60  07/19/19 128/62  04/15/19 130/62    BMP Latest Ref Rng & Units 07/19/2019 02/23/2019 01/14/2019  Glucose 70 - 99 mg/dL 117(H) 89 135(H)  BUN 6 - 23 mg/dL '19 14 17  ' Creatinine 0.40 - 1.20 mg/dL 1.15 1.01 1.19(H)  BUN/Creat Ratio 6 - 22 (calc) - - -  Sodium 135 - 145 mEq/L 140 138 138  Potassium 3.5 - 5.1 mEq/L 3.7 3.9 3.8  Chloride 96 - 112 mEq/L 108 104 108  CO2 19 - 32 mEq/L '25 27 23  ' Calcium 8.4 - 10.5 mg/dL 9.3 9.5 9.5   Previous medications: ***. Patient checks BP at home {CHL HP BP Monitoring [phone]}. Recent home readings: ***. Patient is currently ***at goal on the following medications:  . ***  ***Diet and exercise discussed - ***  Plan  ***Continue current medications.  Hyperlipidemia   LDL goal < 70  Lipid Panel     Component Value Date/Time   CHOL 146 07/19/2019 1633   TRIG 194.0 (H) 07/19/2019 1633   HDL 57.30 07/19/2019 1633   LDLCALC 49 07/19/2019 1633   LDLCALC 63 05/22/2018 1513   LDLDIRECT 106 (H) 07/19/2016 1603    Hepatic Function Latest Ref Rng & Units 07/19/2019 02/23/2019 10/21/2018  Total Protein 6.0 - 8.3 g/dL 6.3 - -  Albumin 3.5 - 5.2 g/dL 4.4 4.3 4.3  AST 0 - 37 U/L 11 - -  ALT 0 - 35 U/L 8 - -  Alk Phosphatase 39 - 117 U/L 70 - -  Total Bilirubin 0.2 - 1.2 mg/dL 0.4 - -  Bilirubin, Direct 0.0 - 0.3 mg/dL - - -    The 10-year ASCVD risk score Mikey Bussing DC Jr., et al., 2013) is: 11.9%   Values used to calculate the score:     Age: 27 years     Sex: Female     Is Non-Hispanic  African American: No     Diabetic: No     Tobacco smoker: No     Systolic Blood Pressure: 284 mmHg     Is BP treated: No     HDL Cholesterol: 57.3 mg/dL     Total Cholesterol: 146 mg/dL   Previous medications: *** Patient is currently at goal the following medications:  . Crestor  40 mg daily   Stroke hx. On ASA. ***Denies any abnormal bruising, bleeding from nose or gums or blood in urine or stool. Patient is currently {CHL Controlled/Uncontrolled:[phone]} on the following medications:  . Aspirin 81 mg once daily   Review side effects/tolerability - *** We discussed diet and exercise extensively. ***  Plan  ***Continue current medications.  GERD  Lab Results  Component Value Date   [id number] 737 07/19/2019   ***Patient denies recent acid reflux.  Currently controlled on: . Pantoprazole 20 mg once daily   We discussed: Avoidance of potential triggers such as alcohol, fatty foods, lying down after eating, and tomato sauce.  Plan   ***Continue current medications.  Osteoporosis   Last DEXA Scan: 05/2017.  VITD  Date Value Ref Range Status  07/19/2019 33.71 30.00 - 100.00 ng/mL Final    Previous medications: fosamax (noncompliance due to gerd). Patient {is;is not an osteoporosis candidate:23886}.   Primary hyperparathyroidism. Current medications: Marland Kitchen Vitamin D3 1000 units once daily   We discussed: {Osteoporosis Counseling:23892}.  Plan  Pt to schedule DEXA as prolia is being considered ***Continue current medications.  Depression   PHQ9 SCORE ONLY 10/29/2019 07/19/2019 04/15/2019  PHQ-9 Total Score 0 0 0   Previous medications: ***. ***Denies side effects, reports ongoing benefit of taking medication. ***Patient is currently controlled on the following medications:  . Bupropion XL 300 mg once daily . Citalopram 20 mg once daily  Reviewed side effects   Plan  ***Continue current medications.  Vaccines   Immunization History  Administered  Date(s) Administered  . Fluad Quad(high Dose 65+) 09/28/2018  . Influenza Split 12/03/2010, 12/09/2011  . Influenza Whole 10/18/2009  . Influenza, High Dose Seasonal PF 11/02/2012, 10/18/2015, 10/21/2016, 10/21/2017  . Influenza,inj,Quad PF,6+ Mos 10/15/2013, 12/06/2014  . Influenza-Unspecified 10/08/2015  . PFIZER SARS-COV-2 Vaccination 01/28/2019, 02/18/2019  . Pneumococcal Conjugate-13 04/15/2014  . Pneumococcal Polysaccharide-23 12/03/2010  . Td 01/07/1998, 10/18/2009  . Tdap 10/18/2016  . Zoster 10/18/2009  . Zoster Recombinat (Shingrix) 11/04/2018    Reviewed and discussed patient's vaccination history.    Plan  Recommended patient receive ***.   Medication Management / Care Coordination   Receives prescription medications from:  [facility], [place] [address] Phone: [phone] Fax: [phone]    ***  Plan  {US Pharmacy HBZJ:69678}. ___________________________ SDOH (Social Determinants of Health) assessments performed: Yes.  Future Appointments  Date Time Provider [place]  12/20/2019  2:00 PM [facility] CCM PHARMACIST [facility]  02/22/2020  3:00 PM Donato Heinz, MD [facility]  03/10/2020  1:00 PM Shamleffer, Melanie Crazier, MD [facility] None  07/20/2020  2:40 PM Marin Olp, MD [facility]  10/30/2020  3:15 PM [facility] HEALTH COACH [facility]   Visit follow-up:  . CPA follow-up: ***. Marland Kitchen RPH follow-up: *** month *** visit.  Madelin Rear, Pharm.D., BCGP Clinical Pharmacist [facility] [phone]

## 2019-12-28 ENCOUNTER — Other Ambulatory Visit: Payer: Self-pay | Admitting: Family Medicine

## 2020-01-06 ENCOUNTER — Telehealth: Payer: Self-pay

## 2020-01-06 NOTE — Telephone Encounter (Signed)
Physical therapist from [facility] was returning a call. He states they have not received an order for Physical Therapy for the pt.   Fax - [phone]

## 2020-01-12 ENCOUNTER — Other Ambulatory Visit: Payer: Self-pay | Admitting: Family Medicine

## 2020-01-12 NOTE — Telephone Encounter (Signed)
Can you look onto this please and fax the order?

## 2020-01-13 ENCOUNTER — Telehealth: Payer: Self-pay

## 2020-01-13 DIAGNOSIS — R269 Unspecified abnormalities of gait and mobility: Secondary | ICD-10-CM

## 2020-01-13 DIAGNOSIS — M25561 Pain in right knee: Secondary | ICD-10-CM

## 2020-01-13 DIAGNOSIS — M25562 Pain in left knee: Secondary | ICD-10-CM

## 2020-01-13 NOTE — Telephone Encounter (Signed)
kelsi touriginy from [facility] care is requesting an order for PT  FAX [phone] Order for right and left knee pain  Abnormalities of gait and mobility.

## 2020-01-13 NOTE — Telephone Encounter (Signed)
Orders have not been received from [facility].

## 2020-01-14 NOTE — Telephone Encounter (Signed)
Patient is following up regarding these orders, can we follow up

## 2020-01-18 ENCOUNTER — Ambulatory Visit (INDEPENDENT_AMBULATORY_CARE_PROVIDER_SITE_OTHER): Payer: PPO

## 2020-01-18 ENCOUNTER — Encounter: Payer: Self-pay | Admitting: Family Medicine

## 2020-01-18 ENCOUNTER — Other Ambulatory Visit: Payer: Self-pay

## 2020-01-18 DIAGNOSIS — Z23 Encounter for immunization: Secondary | ICD-10-CM

## 2020-01-19 NOTE — Telephone Encounter (Signed)
Orders have been received and placed in Dr. Hunter's box to sign. Per Dr. Hunter patient needs to have a in office visit before he can sign the orders. Paperwork was given to Megan in check in to scheduled the appointment.  

## 2020-01-19 NOTE — Telephone Encounter (Signed)
Orders have been received and placed in Dr. Ansel Bong box to sign. Per Dr. Yong Channel patient needs to have a in office visit before he can sign the orders. Paperwork was given to [facility] in check in to scheduled the appointment.

## 2020-01-21 NOTE — Telephone Encounter (Signed)
Pt is scheduled on 1/19 to address this concern.

## 2020-01-26 ENCOUNTER — Encounter: Payer: Self-pay | Admitting: Family Medicine

## 2020-01-26 ENCOUNTER — Telehealth (INDEPENDENT_AMBULATORY_CARE_PROVIDER_SITE_OTHER): Payer: PPO | Admitting: Family Medicine

## 2020-01-26 VITALS — Ht 64.0 in

## 2020-01-26 DIAGNOSIS — E210 Primary hyperparathyroidism: Secondary | ICD-10-CM | POA: Diagnosis not present

## 2020-01-26 DIAGNOSIS — F411 Generalized anxiety disorder: Secondary | ICD-10-CM

## 2020-01-26 DIAGNOSIS — I10 Essential (primary) hypertension: Secondary | ICD-10-CM | POA: Diagnosis not present

## 2020-01-26 DIAGNOSIS — R2681 Unsteadiness on feet: Secondary | ICD-10-CM

## 2020-01-26 DIAGNOSIS — R269 Unspecified abnormalities of gait and mobility: Secondary | ICD-10-CM | POA: Diagnosis not present

## 2020-01-26 DIAGNOSIS — I700 Atherosclerosis of aorta: Secondary | ICD-10-CM

## 2020-01-26 DIAGNOSIS — N1831 Chronic kidney disease, stage 3a: Secondary | ICD-10-CM

## 2020-01-26 DIAGNOSIS — E538 Deficiency of other specified B group vitamins: Secondary | ICD-10-CM | POA: Diagnosis not present

## 2020-01-26 DIAGNOSIS — M810 Age-related osteoporosis without current pathological fracture: Secondary | ICD-10-CM

## 2020-01-26 NOTE — Patient Instructions (Addendum)
Health Maintenance Due  Topic Date Due  . COLONOSCOPY (Pts 45-32yrs Insurance coverage will need to be confirmed) Please discuss with patient.  05/31/2019    Depression screen PHQ 2/9 01/26/2020 10/29/2019 07/19/2019  Decreased Interest 0 0 0  Down, Depressed, Hopeless 0 0 0  PHQ - 2 Score 0 0 0  Altered sleeping - - 0  Tired, decreased energy - - 0  Change in appetite - - 0  Feeling bad or failure about yourself  - - 0  Trouble concentrating - - 0  Moving slowly or fidgety/restless - - 0  Suicidal thoughts - - 0  PHQ-9 Score - - 0  Difficult doing work/chores - - Not difficult at all  Some recent data might be hidden    Recommended follow up: No follow-ups on file.

## 2020-01-26 NOTE — Progress Notes (Signed)
Phone [phone] Virtual visit via Video note   Subjective:  Chief complaint: Chief Complaint  Patient presents with  . Hypertension   This visit type was conducted due to national recommendations for restrictions regarding the COVID-19 Pandemic (e.g. social distancing).  This format is felt to be most appropriate for this patient at this time balancing risks to patient and risks to population by having him in for in person visit.  No physical exam was performed (except for noted visual exam or audio findings with Telehealth visits).    Our team/I connected with Crystal Carroll at 10:40 AM EST by a video enabled telemedicine application ([email or web address] or caregility through epic) and verified that I am speaking with the correct person using two identifiers.  Location patient: Home[place] Location provider: [facility], office Persons participating in the virtual visit:  patient  Our team/I discussed the limitations of evaluation and management by telemedicine and the availability of in person appointments. In light of current covid-19 pandemic, patient also understands that we are trying to protect them by minimizing in office contact if at all possible.  The patient expressed consent for telemedicine visit and agreed to proceed. Patient understands insurance will be billed.   Past Medical History-  Patient Active Problem List   Diagnosis Date Noted  . History of lobectomy of thyroid 02/24/2019    Priority: High  . 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 Hyperparathyroidism, primary ([place]) 07/17/2018    Priority: High  . History of lacunar cerebrovascular accident (CVA) 06/18/2018    Priority: High  . Caregiver burden 04/03/2016    Priority: High  . Mild aortic stenosis 07/19/2019    Priority: Medium  . Osteoporosis 03/13/2018    Priority: Medium  . DDD (degenerative disc disease), lumbar 03/05/2018    Priority:  Medium  . Aortic atherosclerosis ([place]) 06/12/2017    Priority: Medium  . Palpitations 05/12/2017    Priority: Medium  . Vitamin B12 deficiency 05/12/2017    Priority: Medium  . Cervical radiculopathy 04/03/2016    Priority: Medium  . Essential tremor 04/18/2015    Priority: Medium  . Heavy alcohol use 04/14/2014    Priority: Medium  . CKD (chronic kidney disease), stage III ([place]) 10/15/2013    Priority: Medium  . Former smoker 12/24/2006    Priority: Medium  . Hyperlipidemia 05/26/2006    Priority: Medium  . Anxiety state 05/26/2006    Priority: Medium  . Essential hypertension 05/26/2006    Priority: Medium  . Patellofemoral arthritis 03/05/2018    Priority: Low  . Unstable gait 01/20/2018    Priority: Low  . Adenomatous colon polyp 06/14/2014    Priority: Low  . H/O cold sores 10/15/2013    Priority: Low  . GERD 05/26/2006    Priority: Low  . Osteoporosis without current pathological fracture 09/10/2019    Medications- reviewed and updated Current Outpatient Medications  Medication Sig Dispense Refill  . aspirin EC 81 MG tablet Take 81 mg by mouth at bedtime.     Marland Kitchen buPROPion (WELLBUTRIN XL) 300 MG 24 hr tablet TAKE 1 TABLET ONCE DAILY. 90 tablet 0  . citalopram (CELEXA) 20 MG tablet TAKE 1 TABLET ONCE DAILY. 90 tablet 0  . diclofenac sodium (VOLTAREN) 1 % GEL Apply 2 g topically 4 (four) times daily. 100 g 3  . pantoprazole (PROTONIX) 20 MG tablet TAKE 1 TABLET ONCE DAILY. 90 tablet 0  . rosuvastatin (CRESTOR) 40  MG tablet TAKE 1 TABLET ONCE DAILY. 90 tablet 0  . vitamin B-12 (CYANOCOBALAMIN) 500 MCG tablet Take 500 mcg by mouth daily.    Marland Kitchen VITAMIN D PO Take by mouth. Vit D3    . ALPRAZolam (XANAX) 0.25 MG tablet TAKE 1 TABLET DAILY AS NEEDED FOR ANXIETY. 30 tablet 0   No current facility-administered medications for this visit.     Objective:  Ht 5\' 4"  (1.626 m)   LMP  (LMP Unknown)   BMI 26.16 kg/m  self reported vitals Gen: NAD, resting  comfortably Lungs: nonlabored, normal respiratory rate  Skin: appears dry, no obvious rash     Assessment/plan:    #Hyperlipidemia/aortic atherosclerosis/ history of stroke- on aspirin S: Compliant with rosuvastatin 40 mg.  -Aortic atherosclerosis noted CT 06/26/2017 for lung cancer screening.  Risk factor modification with blood pressure and lipid control plan. Lab Results  Component Value Date   CHOL 146 07/19/2019   HDL 57.30 07/19/2019   LDLCALC 49 07/19/2019   LDLDIRECT 106 (H) 07/19/2016   TRIG 194.0 (H) 07/19/2019   CHOLHDL 3 07/19/2019   A/P: Hyperlipidemia well-controlled with LDL under 70-continue current medication.  For history of stroke-continue statin as well as aspirin 81 mg.  Aortic atherosclerosis-continue risk factor modification with blood pressure and lipid control  #Hypertension/CKD stage III S: Patient remains off lisinopril unfortunately blood pressure remains controlled as of most recent check-she states since moving she has not been able to find her cuff but she will try to locate this -GFR has been stable largely in the 55s lately- in the past had been in the 86s A/P: Hypertension previously at [facility] patient to do some home monitoring-she will need to find her home cuff  Her chronic kidney disease stage III-patient agrees to come by next week once I situation is better and update blood work  #Primary hyperparathyroidism status post parathyroidectomy January 2021 #History of thyroid nodule found to be noninvasive follicular neoplasm of thyroid with papillary-like nuclear features now status post lobectomy of thyroid January 2021 S: Hyperparathyroidism likely led to osteoporosis-see discussion below.  Status post parathyroidectomy.  Follows with Dr. Kelton Pillar next visit March 2022   Thyroid nodule removed at same time as parathyroidectomy A/P: Patient doing well-continue follow-up with endocrinology  #Osteoporosis S: Patient was placed on Fosamax once  a week in the past but  hyperparathyroidism/high calcium patient was treated with parathyroidectomy A/P: Need to get updated bone density after parathyroidectomy- patient agrees to call to schedule  #Unstable gait S: Started January 2020.  Stat head CT to rule out subdural hematoma.  Initially also seem to have some lower extremity weakness and numbness.  There was concern for lumbar stenosis.  Symptoms improved with time.  Ultimately did MRI of the brain to rule out prior cerebellar stroke-none was detected.  Did have prior lacunar stroke and patient was placed back on aspirin.  Patient later saw Dr. Tomi Likens July 28, 2018-MRI of cervical spine without contrast was ordered.  There were some disc bulges and MRI of lumbar spine was also ordered September 2020-this showed spinal stenosis which was thought to be contributing to leg pain and balance issues.  A/P: Patient with continued issues with unstable gait-we will refer to physical therapy for gait and balance training   #GERD S: Compliant with Protonix 40 mg-->20mg  and had to add pepcid with dinner A/P: Well-controlled-discussed stepping down to Pepcid alone-patient is not ready to make this transition   #Anxiety/stress management S: Compliant with Celexa 20  mg, Wellbutrin 300 mg extended release.  Also takes sparing Xanax as needed for anxiety A/P: Reasonable control-continue current medication  Follow-up items 1.  Patient will be contacted about physical therapy for gait and balance training at [facility] 2.  Patient agrees to schedule DEXA 3.  Patient agrees to call to schedule colonoscopy 4.  Patient agrees to call to schedule lung cancer screening follow-up 5.  Patient agrees to call back to schedule lab visit 6.  Patient will be following up with cardiology in February as likely will get updated echocardiogram due to aortic stenosis  Recommended follow up: Keep July physical Future Appointments  Date Time Provider [place]  02/14/2020  8:20 AM [facility] ECHO 2 [id number]  02/22/2020  3:00 PM Donato Heinz, MD [facility]  03/10/2020  1:00 PM Shamleffer, Melanie Crazier, MD [facility] None  07/20/2020  2:40 PM Marin Olp, MD [facility]  10/30/2020  3:15 PM [facility] HEALTH COACH [facility]    Lab/Order associations:   ICD-10-CM   1. Gait abnormality  R26.9 Ambulatory referral to Physical Therapy  2. Vitamin B12 deficiency  E53.8 Vitamin B12  3. Stage 3a chronic kidney disease (HCC)  N18.31 Comprehensive metabolic panel  4. Hyperparathyroidism, primary ([place])  E21.0   5. Age-related osteoporosis without current pathological fracture  M81.0   6. Essential hypertension  I10   7. Aortic atherosclerosis (HCC)  I70.0   8. Anxiety state  F41.1   9. Unstable gait  R26.81     No orders of the defined types were placed in this encounter.     Return precautions advised.  [name], MD

## 2020-02-09 DIAGNOSIS — M25561 Pain in right knee: Secondary | ICD-10-CM | POA: Diagnosis not present

## 2020-02-09 DIAGNOSIS — M6281 Muscle weakness (generalized): Secondary | ICD-10-CM | POA: Diagnosis not present

## 2020-02-09 DIAGNOSIS — M5459 Other low back pain: Secondary | ICD-10-CM | POA: Diagnosis not present

## 2020-02-09 DIAGNOSIS — M25562 Pain in left knee: Secondary | ICD-10-CM | POA: Diagnosis not present

## 2020-02-14 ENCOUNTER — Ambulatory Visit (HOSPITAL_COMMUNITY): Payer: PPO | Attending: Cardiovascular Disease

## 2020-02-14 ENCOUNTER — Other Ambulatory Visit: Payer: Self-pay

## 2020-02-14 DIAGNOSIS — I350 Nonrheumatic aortic (valve) stenosis: Secondary | ICD-10-CM | POA: Diagnosis not present

## 2020-02-14 LAB — ECHOCARDIOGRAM COMPLETE
AR max vel: 1.89 cm2
AV Area VTI: 1.98 cm2
AV Area mean vel: 2.1 cm2
AV Mean grad: 12 mmHg
AV Peak grad: 23.2 mmHg
Ao pk vel: 2.41 m/s
Area-P 1/2: 3.21 cm2
S' Lateral: 3 cm

## 2020-02-15 DIAGNOSIS — M6281 Muscle weakness (generalized): Secondary | ICD-10-CM | POA: Diagnosis not present

## 2020-02-15 DIAGNOSIS — M25561 Pain in right knee: Secondary | ICD-10-CM | POA: Diagnosis not present

## 2020-02-15 DIAGNOSIS — M25562 Pain in left knee: Secondary | ICD-10-CM | POA: Diagnosis not present

## 2020-02-15 DIAGNOSIS — M5459 Other low back pain: Secondary | ICD-10-CM | POA: Diagnosis not present

## 2020-02-17 DIAGNOSIS — M25562 Pain in left knee: Secondary | ICD-10-CM | POA: Diagnosis not present

## 2020-02-17 DIAGNOSIS — M25561 Pain in right knee: Secondary | ICD-10-CM | POA: Diagnosis not present

## 2020-02-17 DIAGNOSIS — M6281 Muscle weakness (generalized): Secondary | ICD-10-CM | POA: Diagnosis not present

## 2020-02-17 DIAGNOSIS — M5459 Other low back pain: Secondary | ICD-10-CM | POA: Diagnosis not present

## 2020-02-20 NOTE — Progress Notes (Addendum)
Cardiology Office Note:    Date:  02/22/2020   ID:  Crystal Carroll, DOB 1945-08-12, MRN [id number]  PCP:  Marin Olp, MD  Cardiologist:  No primary care provider on file.  Electrophysiologist:  None   Referring MD: Marin Olp, MD   Chief Complaint  Patient presents with  . Palpitations    History of Present Illness:     Crystal Carroll is a 75 y.o. female with a hx of hypertension, hyperlipidemia, hyperparathyroidism, CKD stage III who presents for follow-up.  She was referred by Dr. Yong Channel for evaluation of palpitations, initial visit on 11/11/18.  She reports that she had been having palpitations.  Has been occurring for over 1 year.  Does not have feeling like heart is racing.  Can last for up to 1 minute.  No chest pain, dyspnea.  She wore a 30-day cardiac monitor, which showed predominantly sinus rhythm and most of the triggered events were sinus rhythm.  However there were occasional episodes of accelerated junctional rhythm and 2 of her triggered events involved a junctional rhythm.  Initial clinic visit on 11/11/2018, discussed that she likely had some degree of sick sinus syndrome commended getting a exercise treadmill test to evaluate for chronotropic incompetence.  She declined a treadmill test, as she did not think that she could walk on a treadmill due to her knee pain.  TTE was checked evaluate for structural heart disease, which showed normal LV systolic function, normal RV function, mild aortic stenosis, small PFO.  Repeat echo 02/14/2020 showed mild aortic stenosis (mean gradient 12 mmHg, V-max 2.4 m/s).  Normal biventricular function.  Smoked 50 years, 1ppd, quit in 04-26-2013.  Father died of MI at 84.    Since last appointment, she reports that she has been doing well.  States that she has had rare palpitations, only twice in the last year.  Palpitations lasted 1 minute.  She denies any chest pain, dyspnea, lightheadedness, syncope, or lower extremity edema.  Does  physical therapy twice per week, denies any exertional symptoms.   Past Medical History:  Diagnosis Date  . Anxiety   . Aortic atherosclerosis ([place])   . Aortic valvar stenosis 11/2018   Mild, noted on ECHO  . Arthritis    cervical neck  . CKD (chronic kidney disease), stage III (HCC)    Stable  . GERD (gastroesophageal reflux disease)   . History of TIA (transient ischemic attack)    per patient, didn't know about it  . Hyperlipidemia   . Hyperparathyroidism ([place])   . Hypertension    History of   . Junctional rhythm   . Osteoporosis   . Palpitations   . Thyroid nodule    2.0 cm solid nodule in the inferior left thyroid lobe     Past Surgical History:  Procedure Laterality Date  . CERVICAL LAMINECTOMY  April 27, 2002   [facility]arle Gell  . COLONOSCOPY    . ESOPHAGOGASTRODUODENOSCOPY    . PARATHYROIDECTOMY Left 01/13/2019   Procedure: LEFT PARATHYROIDECTOMY;  Surgeon: Armandina Gemma, MD;  Location: [facility];  Service: General;  Laterality: Left;  . THYROID LOBECTOMY Left 01/13/2019   Procedure: LEFT THYROID LOBECTOMY;  Surgeon: Armandina Gemma, MD;  Location: [facility];  Service: General;  Laterality: Left;    Current Medications: Current Meds  Medication Sig  . ALPRAZolam (XANAX) 0.25 MG tablet TAKE 1 TABLET DAILY AS NEEDED FOR ANXIETY.  Marland Kitchen aspirin EC 81 MG tablet Take 81 mg by mouth at bedtime.   Marland Kitchen  buPROPion (WELLBUTRIN XL) 300 MG 24 hr tablet TAKE 1 TABLET ONCE DAILY.  . citalopram (CELEXA) 20 MG tablet TAKE 1 TABLET ONCE DAILY.  Marland Kitchen diclofenac sodium (VOLTAREN) 1 % GEL Apply 2 g topically 4 (four) times daily.  . pantoprazole (PROTONIX) 20 MG tablet TAKE 1 TABLET ONCE DAILY.  . rosuvastatin (CRESTOR) 40 MG tablet TAKE 1 TABLET ONCE DAILY.  . vitamin B-12 (CYANOCOBALAMIN) 500 MCG tablet Take 500 mcg by mouth daily.  Marland Kitchen VITAMIN D PO Take by mouth. Vit D3     Allergies:   Codeine and Sulfamethoxazole   Social History   Socioeconomic History  . Marital status: Married    Spouse name: Leroy Sea  .  Number of children: 1  . Years of education: 56  . Highest education level: Associate degree: academic program  Occupational History  . Occupation: Retired  Tobacco Use  . Smoking status: Former Smoker    Packs/day: 0.75    Years: 50.00    Pack years: 37.50    Types: Cigarettes    Quit date: 03/07/2013    Years since quitting: 6.9  . Smokeless tobacco: Never Used  Vaping Use  . Vaping Use: Never used  Substance and Sexual Activity  . Alcohol use: Yes    Alcohol/week: 0.0 standard drinks    Comment: 2 per day  . Drug use: No  . Sexual activity: Not on file  Other Topics Concern  . Not on file  Social History Narrative   Married to [facility] (patient) in 1981. 2nd marriage-1 child with 1 adopted grandchild.       Retired as Statistician.       Hobbies: antiques, former Firefighter      Exercise: none currently.       Patient is right-handed. She lives with her husband in a two level home. She does not exercise.      Social Determinants of Health   Financial Resource Strain: Low Risk   . Difficulty of Paying Living Expenses: Not hard at all  Food Insecurity: No Food Insecurity  . Worried About Charity fundraiser in the Last Year: Never true  . Ran Out of Food in the Last Year: Never true  Transportation Needs: No Transportation Needs  . Lack of Transportation (Medical): No  . Lack of Transportation (Non-Medical): No  Physical Activity: Inactive  . Days of Exercise per Week: 0 days  . Minutes of Exercise per Session: 0 min  Stress: No Stress Concern Present  . Feeling of Stress : Not at all  Social Connections: Moderately Isolated  . Frequency of Communication with Friends and Family: More than three times a week  . Frequency of Social Gatherings with Friends and Family: More than three times a week  . Attends Religious Services: Never  . Active Member of Clubs or Organizations: No  . Attends Archivist Meetings: Never  . Marital Status:  Married     Family History: The patient's family history includes Heart attack (age of onset: 80) in her father; Melanoma (age of onset: 64) in her mother.  ROS:   Please see the history of present illness.     All other systems reviewed and are negative.  EKGs/Labs/Other Studies Reviewed:    The following studies were reviewed today:   EKG:  EKG is ordered today.  The ekg ordered today demonstrates normal sinus rhythm, rate 65, nonspecific T wave flattening  Recent Labs: 07/19/2019: ALT 8; BUN 19; Creatinine,  Ser 1.15; Hemoglobin 12.5; Platelets 190.0; Potassium 3.7; Sodium 140; TSH 2.89  Recent Lipid Panel    Component Value Date/Time   CHOL 146 07/19/2019 1633   TRIG 194.0 (H) 07/19/2019 1633   HDL 57.30 07/19/2019 1633   CHOLHDL 3 07/19/2019 1633   VLDL 38.8 07/19/2019 1633   LDLCALC 49 07/19/2019 1633   LDLCALC 63 05/22/2018 1513   LDLDIRECT 106 (H) 07/19/2016 1603    Physical Exam:    VS:  BP 132/66   Pulse 65   Ht 5' 4.5" (1.638 m)   Wt 150 lb 9.6 oz (68.3 kg)   LMP  (LMP Unknown)   SpO2 98%   BMI 25.45 kg/m     Wt Readings from Last 3 Encounters:  02/22/20 150 lb 9.6 oz (68.3 kg)  09/10/19 152 lb 6.4 oz (69.1 kg)  07/19/19 157 lb (71.2 kg)     GEN:  in no acute distress HEENT: Normal NECK: No JVD; No carotid bruits CARDIAC: RRR, 2/6 systolic murmur RESPIRATORY:  Clear to auscultation without rales, wheezing or rhonchi  ABDOMEN: Soft, non-tender, non-distended MUSCULOSKELETAL:  No edema; No deformity  SKIN: Warm and dry NEUROLOGIC:  Alert and oriented x 3 PSYCHIATRIC:  Normal affect   TTE 11/24/18: 1. Left ventricular ejection fraction, by visual estimation, is 60 to  65%. The left ventricle has normal function. There is no left ventricular  hypertrophy.  2. The average left ventricular global longitudinal strain is -22.0 %.  3. Global right ventricle has normal systolic function.The right  ventricular size is normal. No increase in right  ventricular wall  thickness.  4. Left atrial size was normal.  5. Right atrial size was normal.  6. The mitral valve is normal in structure. No evidence of mitral valve  regurgitation.  7. The tricuspid valve is not well visualized. Tricuspid valve  regurgitation is not demonstrated.  8. The aortic valve is tricuspid. Aortic valve regurgitation is not  visualized. Mild aortic valve stenosis. Vmax 2.4, MG 13, AVA 1.5, DI 0.49  9. The pulmonic valve was not well visualized. Pulmonic valve  regurgitation is not visualized.  10. The inferior vena cava is normal in size with greater than 50%  respiratory variability, suggesting right atrial pressure of 3 mmHg.  11. TR signal is inadequate for assessing pulmonary artery systolic  pressure.  12. Small PFO   ASSESSMENT:    1. Junctional rhythm   2. Aortic valve stenosis, etiology of cardiac valve disease unspecified   3. Essential hypertension   4. Hyperlipidemia, unspecified hyperlipidemia type    PLAN:     Junctional rhythm: Occasional episodes of accelerated junctional rhythm noted on monitor.  Rates in 27s to 45s.  Suspect likely has some degree of sick sinus syndrome and had discussed getting a exercise treadmill test to evaluate for chronotropic incompetence.  She declines a treadmill test as did not think that she could walk on a treadmill due to her knee pain.  She has no evidence of symptomatic bradycardia, no indication for pacemaker at this time.  TTE shows mild aortic stenosis, but otherwise no structural heart disease.  Recommend avoidance of AV nodal blocking agents.  Aortic stenosis: Mild on TTE on 02/14/2020.  Will monitor  Hyperlipidemia: On rosuvastatin 40 mg daily.  Last LDL 49 on 07/19/19  HTN: on lisinopril 20 mg daily.  Appears controlled  RTC in 1 year  Medication Adjustments/Labs and Tests Ordered: Current medicines are reviewed at length with the patient today.  Concerns regarding medicines are outlined  above.  Orders Placed This Encounter  Procedures  . EKG 12-Lead   No orders of the defined types were placed in this encounter.   Patient Instructions  Medication Instructions:  Your physician recommends that you continue on your current medications as directed. Please refer to the Current Medication list given to you today.  *If you need a refill on your cardiac medications before your next appointment, please call your pharmacy*  Follow-Up: At [facility], you and your health needs are our priority.  As part of our continuing mission to provide you with exceptional heart care, we have created designated Provider Care Teams.  These Care Teams include your primary Cardiologist (physician) and Advanced Practice Providers (APPs -  Physician Assistants and Nurse Practitioners) who all work together to provide you with the care you need, when you need it.  We recommend signing up for the patient portal called "MyChart".  Sign up information is provided on this After Visit Summary.  MyChart is used to connect with patients for Virtual Visits (Telemedicine).  Patients are able to view lab/test results, encounter notes, upcoming appointments, etc.  Non-urgent messages can be sent to your provider as well.   To learn more about what you can do with MyChart, go to [email or web address].    Your next appointment:   12 month(s)  The format for your next appointment:   In Person  Provider:   Oswaldo Milian, MD        Signed, Donato Heinz, MD  02/22/2020 6:26 PM    [place]

## 2020-02-21 DIAGNOSIS — M25562 Pain in left knee: Secondary | ICD-10-CM | POA: Diagnosis not present

## 2020-02-21 DIAGNOSIS — M25561 Pain in right knee: Secondary | ICD-10-CM | POA: Diagnosis not present

## 2020-02-21 DIAGNOSIS — M5459 Other low back pain: Secondary | ICD-10-CM | POA: Diagnosis not present

## 2020-02-21 DIAGNOSIS — M6281 Muscle weakness (generalized): Secondary | ICD-10-CM | POA: Diagnosis not present

## 2020-02-22 ENCOUNTER — Ambulatory Visit (INDEPENDENT_AMBULATORY_CARE_PROVIDER_SITE_OTHER): Payer: PPO | Admitting: Cardiology

## 2020-02-22 ENCOUNTER — Other Ambulatory Visit: Payer: Self-pay

## 2020-02-22 ENCOUNTER — Encounter: Payer: Self-pay | Admitting: Cardiology

## 2020-02-22 VITALS — BP 132/66 | HR 65 | Ht 64.5 in | Wt 150.6 lb

## 2020-02-22 DIAGNOSIS — E785 Hyperlipidemia, unspecified: Secondary | ICD-10-CM

## 2020-02-22 DIAGNOSIS — I350 Nonrheumatic aortic (valve) stenosis: Secondary | ICD-10-CM

## 2020-02-22 DIAGNOSIS — I498 Other specified cardiac arrhythmias: Secondary | ICD-10-CM

## 2020-02-22 DIAGNOSIS — I10 Essential (primary) hypertension: Secondary | ICD-10-CM

## 2020-02-22 NOTE — Patient Instructions (Signed)

## 2020-02-25 DIAGNOSIS — M25562 Pain in left knee: Secondary | ICD-10-CM | POA: Diagnosis not present

## 2020-02-25 DIAGNOSIS — M6281 Muscle weakness (generalized): Secondary | ICD-10-CM | POA: Diagnosis not present

## 2020-02-25 DIAGNOSIS — M5459 Other low back pain: Secondary | ICD-10-CM | POA: Diagnosis not present

## 2020-02-25 DIAGNOSIS — M25561 Pain in right knee: Secondary | ICD-10-CM | POA: Diagnosis not present

## 2020-03-02 DIAGNOSIS — M5459 Other low back pain: Secondary | ICD-10-CM | POA: Diagnosis not present

## 2020-03-02 DIAGNOSIS — M6281 Muscle weakness (generalized): Secondary | ICD-10-CM | POA: Diagnosis not present

## 2020-03-02 DIAGNOSIS — M25562 Pain in left knee: Secondary | ICD-10-CM | POA: Diagnosis not present

## 2020-03-02 DIAGNOSIS — M25561 Pain in right knee: Secondary | ICD-10-CM | POA: Diagnosis not present

## 2020-03-03 DIAGNOSIS — M25561 Pain in right knee: Secondary | ICD-10-CM | POA: Diagnosis not present

## 2020-03-03 DIAGNOSIS — M5459 Other low back pain: Secondary | ICD-10-CM | POA: Diagnosis not present

## 2020-03-03 DIAGNOSIS — M25562 Pain in left knee: Secondary | ICD-10-CM | POA: Diagnosis not present

## 2020-03-03 DIAGNOSIS — M6281 Muscle weakness (generalized): Secondary | ICD-10-CM | POA: Diagnosis not present

## 2020-03-07 DIAGNOSIS — M25562 Pain in left knee: Secondary | ICD-10-CM | POA: Diagnosis not present

## 2020-03-07 DIAGNOSIS — M5459 Other low back pain: Secondary | ICD-10-CM | POA: Diagnosis not present

## 2020-03-07 DIAGNOSIS — M6281 Muscle weakness (generalized): Secondary | ICD-10-CM | POA: Diagnosis not present

## 2020-03-07 DIAGNOSIS — M25561 Pain in right knee: Secondary | ICD-10-CM | POA: Diagnosis not present

## 2020-03-09 ENCOUNTER — Other Ambulatory Visit: Payer: Self-pay | Admitting: Family Medicine

## 2020-03-09 DIAGNOSIS — M25562 Pain in left knee: Secondary | ICD-10-CM | POA: Diagnosis not present

## 2020-03-09 DIAGNOSIS — M25561 Pain in right knee: Secondary | ICD-10-CM | POA: Diagnosis not present

## 2020-03-09 DIAGNOSIS — M6281 Muscle weakness (generalized): Secondary | ICD-10-CM | POA: Diagnosis not present

## 2020-03-09 DIAGNOSIS — M5459 Other low back pain: Secondary | ICD-10-CM | POA: Diagnosis not present

## 2020-03-10 ENCOUNTER — Ambulatory Visit: Payer: PPO | Admitting: Internal Medicine

## 2020-03-10 ENCOUNTER — Encounter: Payer: Self-pay | Admitting: Internal Medicine

## 2020-03-10 ENCOUNTER — Other Ambulatory Visit: Payer: Self-pay

## 2020-03-10 VITALS — BP 124/86 | HR 68 | Ht 64.0 in | Wt 152.1 lb

## 2020-03-10 DIAGNOSIS — M810 Age-related osteoporosis without current pathological fracture: Secondary | ICD-10-CM | POA: Diagnosis not present

## 2020-03-10 DIAGNOSIS — D497 Neoplasm of unspecified behavior of endocrine glands and other parts of nervous system: Secondary | ICD-10-CM

## 2020-03-10 LAB — COMPREHENSIVE METABOLIC PANEL
ALT: 9 U/L (ref 0–35)
AST: 12 U/L (ref 0–37)
Albumin: 4.3 g/dL (ref 3.5–5.2)
Alkaline Phosphatase: 64 U/L (ref 39–117)
BUN: 22 mg/dL (ref 6–23)
CO2: 27 mEq/L (ref 19–32)
Calcium: 9.6 mg/dL (ref 8.4–10.5)
Chloride: 106 mEq/L (ref 96–112)
Creatinine, Ser: 1.12 mg/dL (ref 0.40–1.20)
GFR: 48.13 mL/min — ABNORMAL LOW (ref >60.00)
Glucose, Bld: 84 mg/dL (ref 70–99)
Potassium: 3.8 mEq/L (ref 3.5–5.1)
Sodium: 141 mEq/L (ref 135–145)
Total Bilirubin: 0.4 mg/dL (ref 0.2–1.2)
Total Protein: 6.7 g/dL (ref 6.0–8.3)

## 2020-03-10 LAB — TSH: TSH: 2.3 u[IU]/mL (ref 0.35–4.50)

## 2020-03-10 LAB — VITAMIN D 25 HYDROXY (VIT D DEFICIENCY, FRACTURES): VITD: 45.84 ng/mL (ref 30.00–100.00)

## 2020-03-10 NOTE — Progress Notes (Signed)
Name: Crystal Carroll  MRN/ DOB: [id number], [birth year]    Age/ Sex: 75 y.o., female    PCP: Crystal Olp, MD   Reason for Endocrinology Evaluation: Hypercalcemia/ Osteoporosis      Date of Initial Endocrinology Evaluation:  07/17/2018    HPI: Crystal Carroll is a 75 y.o. female with a past medical history of GERD,and  gait instability . The patient presented for initial endocrinology clinic visit on 07/17/2018 for consultative assistance with her Hypercalcemia    Hypercalcemia History :  Crystal Carroll has been noted to have intermittent hypercalcemia since 2008 but this has become more persistent in 05/2018.  24- hr urine Ca./cr ratio of 0.03 which is consistent with primary hyperparathyroidism (07/2018). She is s/p left inferior parathyroidectomy 01/13/2019 with normalization of calcium.    THYROID HISTORY:   A thyroid ultrasound was performed 10/2018 which demonstrated a 2 cm left inferior nodule. FNA showed FLUS Afirma was suspicious. Pt underwent left lobectomy  01/13/2019 with pathology report consistent with NIFTP.      Osteoporosis History : Pt was diagnosed with osteoporosis:2019. Was started on Fosamax 05/2017 but reported non-compliance.    SUBJECTIVE:   Today (09/10/2019): Crystal Carroll is here for a follow up on osteoporosis .  She is comfortable at [employer] [facility] with Parkinson's   Pt denies any local neck symptoms She has mild constipation  Denies palpitations    She does not recall the last time she took Alendronate   She is on Calcium and Vitamin D 1000 iu daily           HISTORY:  Past Medical History:  Past Medical History:  Diagnosis Date  . Anxiety   . Aortic atherosclerosis ([place])   . Aortic valvar stenosis 11/2018   Mild, noted on ECHO  . Arthritis    cervical neck  . CKD (chronic kidney disease), stage III ([facility])    Stable  . GERD (gastroesophageal reflux disease)   . History of TIA (transient ischemic attack)    per patient, didn't  know about it  . Hyperlipidemia   . Hyperparathyroidism ([place])   . Hypertension    History of   . Junctional rhythm   . Osteoporosis   . Palpitations   . Thyroid nodule    2.0 cm solid nodule in the inferior left thyroid lobe    Past Surgical History:  Past Surgical History:  Procedure Laterality Date  . CERVICAL LAMINECTOMY  2004   Crystal Carroll  . COLONOSCOPY    . ESOPHAGOGASTRODUODENOSCOPY    . PARATHYROIDECTOMY Left 01/13/2019   Procedure: LEFT PARATHYROIDECTOMY;  Surgeon: Crystal Gemma, MD;  Location: [facility];  Service: General;  Laterality: Left;  . THYROID LOBECTOMY Left 01/13/2019   Procedure: LEFT THYROID LOBECTOMY;  Surgeon: Crystal Gemma, MD;  Location: [facility];  Service: General;  Laterality: Left;      Social History:  reports that she quit smoking about 7 years ago. Her smoking use included cigarettes. She has a 37.50 pack-year smoking history. She has never used smokeless tobacco. She reports current alcohol use. She reports that she does not use drugs.  Family History: family history includes Heart attack (age of onset: 71) in her father; Melanoma (age of onset: 74) in her mother.   HOME MEDICATIONS: Allergies as of 03/10/2020      Reactions   Codeine Nausea Only   Sulfamethoxazole    REACTION: GI upset      Medication List  Accurate as of March 10, 2020  4:50 PM. If you have any questions, ask your nurse or doctor.        ALPRAZolam 0.25 MG tablet Commonly known as: XANAX TAKE 1 TABLET DAILY AS NEEDED FOR ANXIETY.   aspirin EC 81 MG tablet Take 81 mg by mouth at bedtime.   buPROPion 300 MG 24 hr tablet Commonly known as: WELLBUTRIN XL TAKE 1 TABLET ONCE DAILY.   citalopram 20 MG tablet Commonly known as: CELEXA TAKE 1 TABLET ONCE DAILY.   diclofenac sodium 1 % Gel Commonly known as: VOLTAREN Apply 2 g topically 4 (four) times daily.   pantoprazole 20 MG tablet Commonly known as: PROTONIX TAKE 1 TABLET ONCE DAILY.   rosuvastatin 40 MG  tablet Commonly known as: CRESTOR TAKE 1 TABLET ONCE DAILY.   vitamin B-12 500 MCG tablet Commonly known as: CYANOCOBALAMIN Take 500 mcg by mouth daily.   VITAMIN D PO Take by mouth. Vit D3         OBJECTIVE:  VS: BP 124/86   Pulse 68   Ht 5\' 4"  (1.626 m)   Wt 152 lb 2 oz (69 kg)   LMP  (LMP Unknown)   SpO2 98%   BMI 26.11 kg/m    Wt Readings from Last 3 Encounters:  03/10/20 152 lb 2 oz (69 kg)  02/22/20 150 lb 9.6 oz (68.3 kg)  09/10/19 152 lb 6.4 oz (69.1 kg)     EXAM: General: Pt appears well and is in NAD  Neck: General: Supple without adenopathy. Thyroid:  No goiter or nodules appreciated. Surgical scar noted  Lungs: Clear with good BS bilat with no rales, rhonchi, or wheezes  Heart: Auscultation: RRR.  Abdomen: Normoactive bowel sounds, soft, nontender, without masses or organomegaly palpable  Extremities: BL LE: No pretibial edema normal.  Mental Status: Judgment, insight: Intact Orientation: Oriented to time, place, and person Mood and affect: No depression, anxiety, or agitation     DATA REVIEWED: Results for Crystal, Carroll (MRN [id number]) as of 03/13/2020 09:11  Ref. Range 03/10/2020 13:40  Sodium Latest Ref Range: 135 - 145 mEq/L 141  Potassium Latest Ref Range: 3.5 - 5.1 mEq/L 3.8  Chloride Latest Ref Range: 96 - 112 mEq/L 106  CO2 Latest Ref Range: 19 - 32 mEq/L 27  Glucose Latest Ref Range: 70 - 99 mg/dL 84  BUN Latest Ref Range: 6 - 23 mg/dL 22  Creatinine Latest Ref Range: 0.40 - 1.20 mg/dL 1.12  Calcium Latest Ref Range: 8.4 - 10.5 mg/dL 9.6  Alkaline Phosphatase Latest Ref Range: 39 - 117 U/L 64  Albumin Latest Ref Range: 3.5 - 5.2 g/dL 4.3  AST Latest Ref Range: 0 - 37 U/L 12  ALT Latest Ref Range: 0 - 35 U/L 9  Total Protein Latest Ref Range: 6.0 - 8.3 g/dL 6.7  Total Bilirubin Latest Ref Range: 0.2 - 1.2 mg/dL 0.4  GFR Latest Ref Range: >60.00 mL/min 48.13 (L)  VITD Latest Ref Range: 30.00 - 100.00 ng/mL 45.84  TSH Latest Ref Range:  0.35 - 4.50 uIU/mL 2.30   Pathology report 01/13/2019  PARATHYROID, LEFT INFERIOR, PARATHYROIDECTOMY:  - Hypercellular parathyroid tissue (0.236 g)   B. THYROID, LEFT, LOBECTOMY:  - Noninvasive follicular thyroid neoplasm with papillary-like nuclear  features (NIFTP), 1.8 cm  - Margins are uninvolved by neoplasm    ASSESSMENT/PLAN/RECOMMENDATIONS:   1. Noninvasive follicular thyroid neoplasm with papillary- like nuclear features (NIFTP) 1.8 cm   - S/P left lobectomy 01/13/2019 -  NIFTP have a very low malignancy potential and are uniformly cured by lobectomy. - These tumors are managed as neoplasms rather then malignancy  - No TSH goal is needed with NIFTP  - TFT's continue to be normal   2. Osteoporosis :  - Pt with hx of non-compliance with Fosamax , this was mainly due to GERD symptoms - She was supposed to schedule her bone density in 2021 , today this was scheduled for 03/13/2020 - Does not recall the last time she used Alendronate , will discuss treatment once DXA results are back   - She is on 1000 iu Vitamin D3 and calcium      F/u in 1 yr     Signed electronically by: Mack Guise, MD  [facility]  [facility] [address] [place], [place] [address] Phone: [phone] FAX: [phone]   CC: Crystal Carroll, [place] [address] Phone: [phone] Fax: [phone]   Return to Endocrinology clinic as below: Future Appointments  Date Time Provider [place]  03/13/2020  9:30 AM [facility] DEXA 1 [facility]  07/20/2020  2:40 PM Crystal Olp, MD [facility] [place]  10/30/2020  3:15 PM [facility] HEALTH COACH [facility]  03/12/2021  1:00 PM Shamleffer, Melanie Crazier, MD [facility] None

## 2020-03-10 NOTE — Patient Instructions (Signed)
-   Please stop by the lab today  

## 2020-03-13 ENCOUNTER — Inpatient Hospital Stay: Admission: RE | Admit: 2020-03-13 | Payer: PPO | Source: Ambulatory Visit

## 2020-03-15 IMAGING — MR MRI CERVICAL SPINE WITHOUT CONTRAST
5 series · 35 of 48 positions shown · non-contrast
Comparison: MRI cervical spine dated March 11, 2016.

CLINICAL DATA: Chronic neck pain radiating into both arms. Prior
fusion 2000.

EXAM:
MRI CERVICAL SPINE WITHOUT CONTRAST
TECHNIQUE: Multiplanar, multisequence MR imaging of the cervical spine was
performed. No intravenous contrast was administered.

[Series 2: T2 · sagittal · 3.0mm · 0.41mm/px · 7 of 17 slices shown (1 of 2)]
[im 1/17]
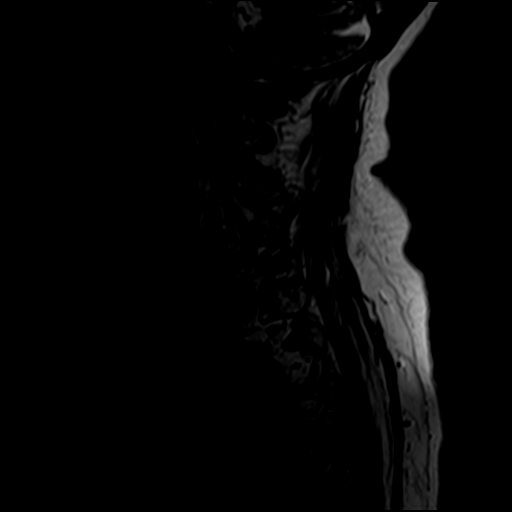
[im 3/17]
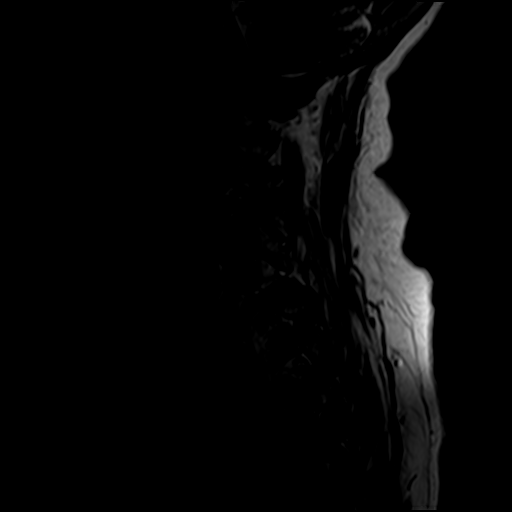
[im 6/17]
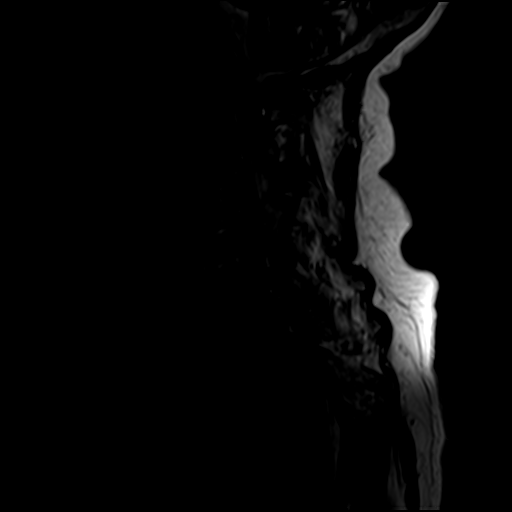
[im 9/17]
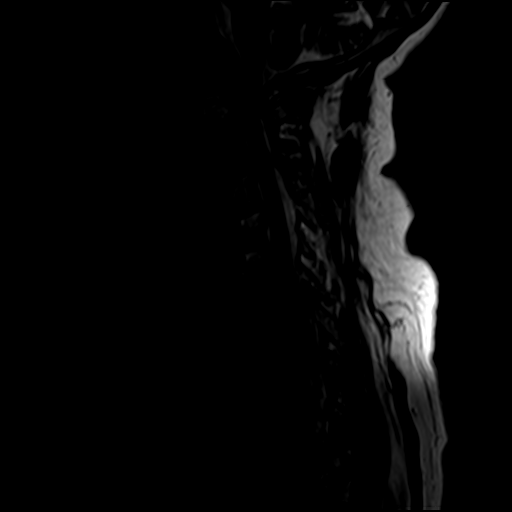
[im 11/17]
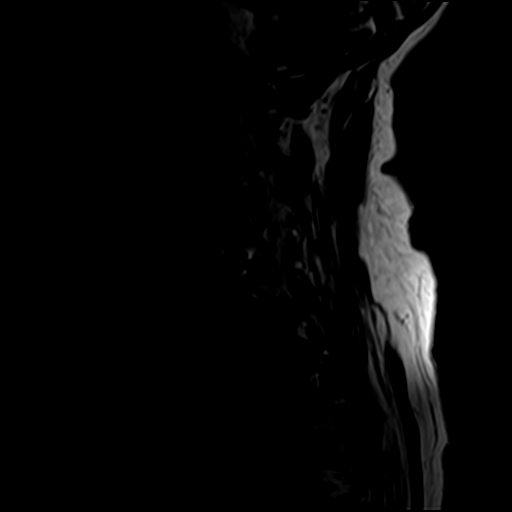
[im 14/17]
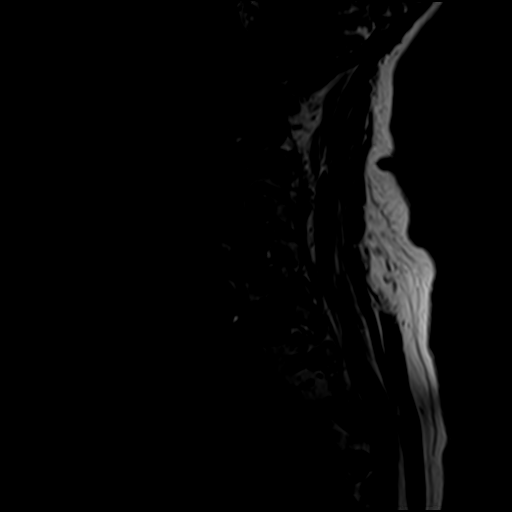
[im 17/17]
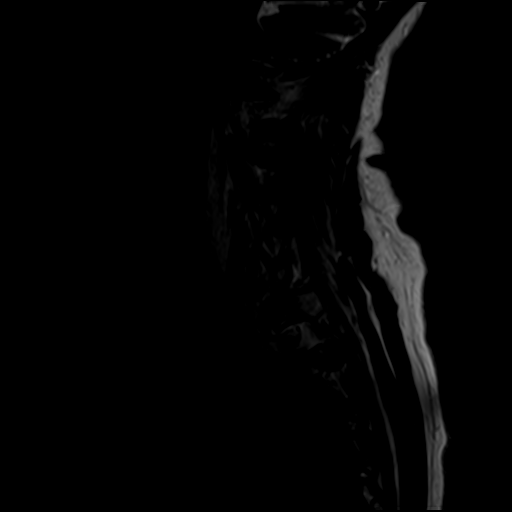

[Series 3: STIR · sagittal · 3.0mm · 0.82mm/px · 7 of 17 slices shown]
[im 1/17]
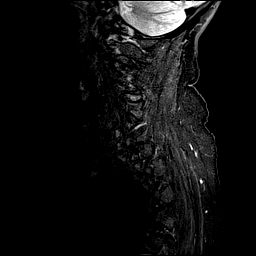
[im 3/17]
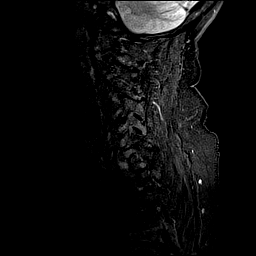
[im 6/17]
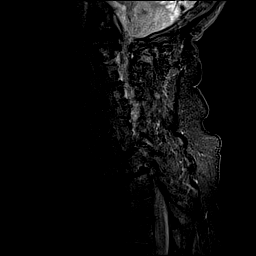
[im 9/17]
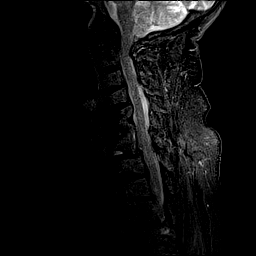
[im 11/17]
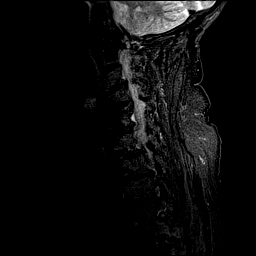
[im 14/17]
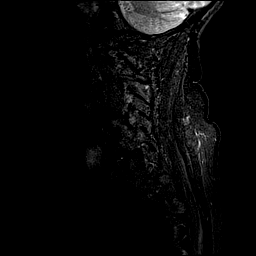
[im 17/17]
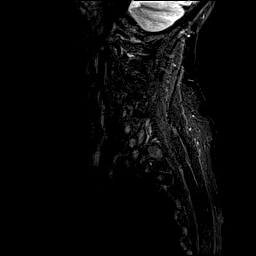

[Series 4: T1 · sagittal · 3.0mm · 0.82mm/px · 8 of 17 slices shown]
[im 1/17]
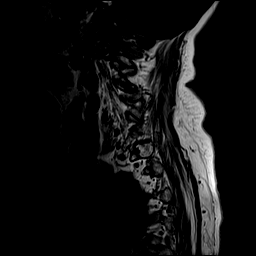
[im 3/17]
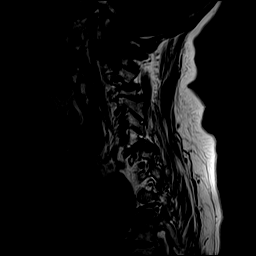
[im 5/17]
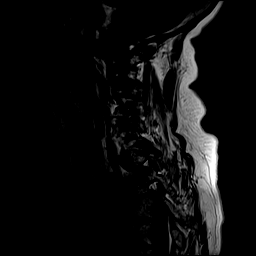
[im 7/17]
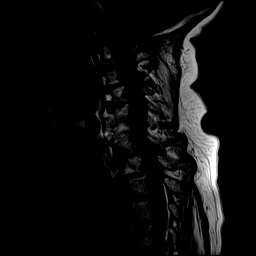
[im 10/17]
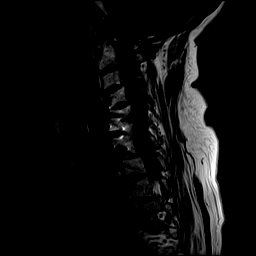
[im 12/17]
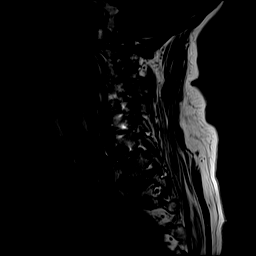
[im 14/17]
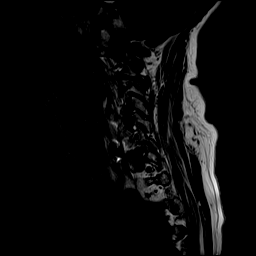
[im 17/17]
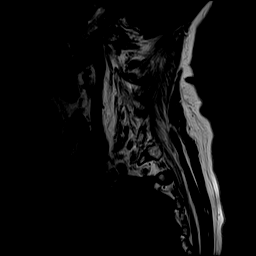

[Series 5: T2 · axial · 3.0mm · 0.70mm/px · z∈[-71,+25]mm · 9 of 28 slices shown (2 of 2)]
[im 1/28]
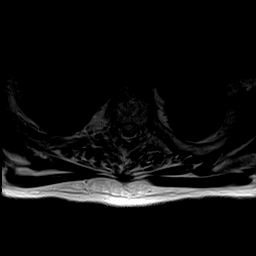
[im 5/28]
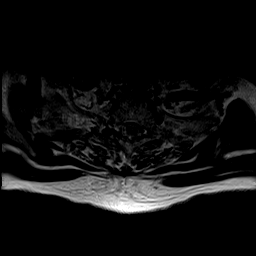
[im 10/28]
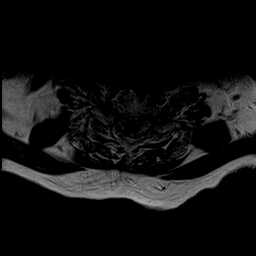
[im 12/28]
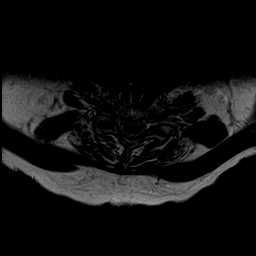
[im 14/28]
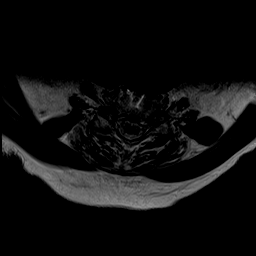
[im 16/28]
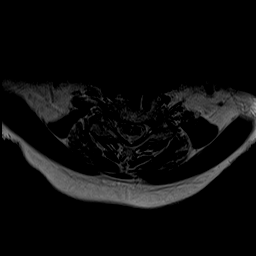
[im 19/28]
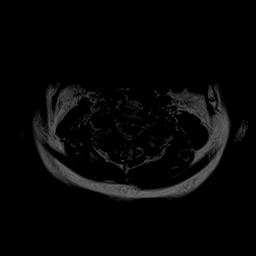
[im 23/28]
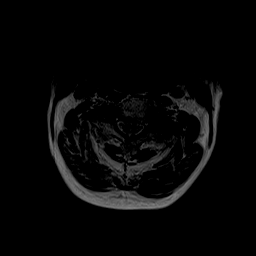
[im 28/28]
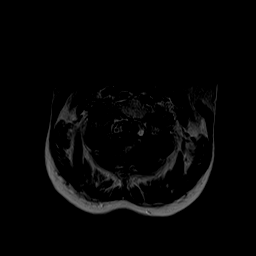

[Series 6: GRE · axial · 3.0mm · 0.35mm/px · z∈[-71,-32]mm · 4 of 28 slices shown]
[im 1/28]
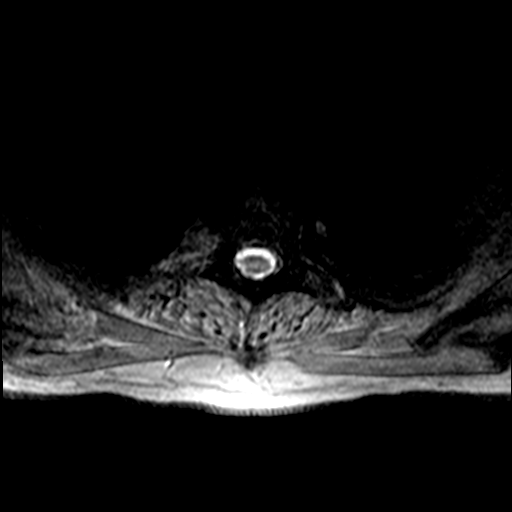
[im 5/28]
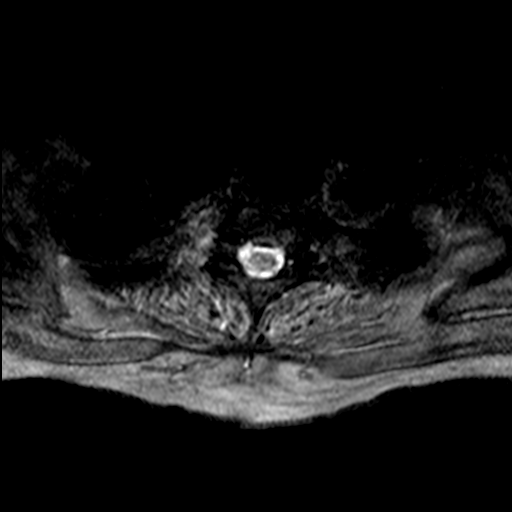
[im 10/28]
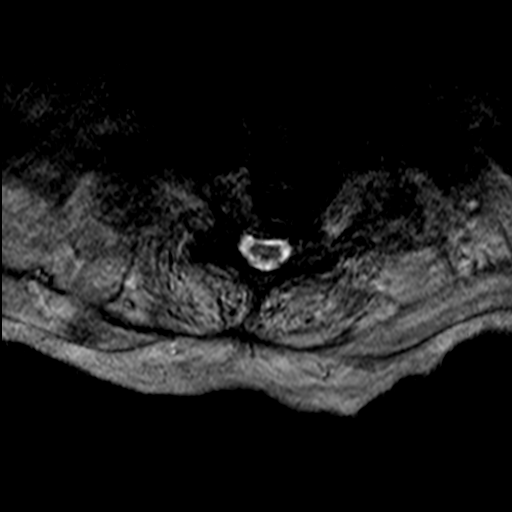
[im 12/28]
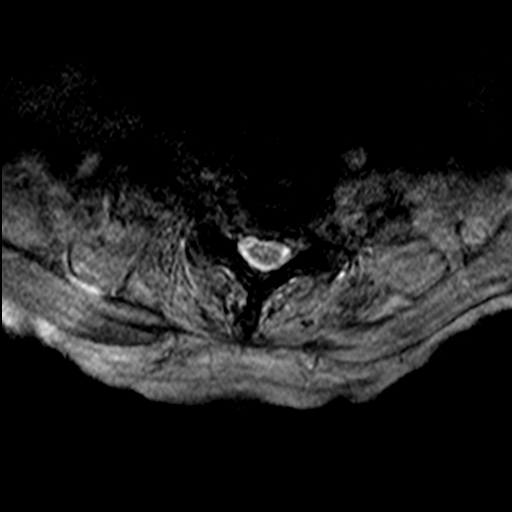

[35 of 48 positions shown; findings below may reference images not displayed]

FINDINGS: Alignment: Unchanged facet mediated 3 mm anterolisthesis at C3-C4,
C4-C5, and C7-T1.

Vertebrae: Mildly progressive moderate atlanto-occipital and
atlanto-axial osteoarthritis with prominent ligamentous thickening
posterior to the dens resulting in now moderate spinal canal
stenosis. Prior C5-C7 ACDF. No fracture, evidence of discitis, or
focal bone lesion.

Cord: Normal signal.

Posterior Fossa, vertebral arteries, paraspinal tissues: Negative.

Disc levels:

C2-C3: Negative disc. Unchanged severe right and moderate left facet
arthropathy. Unchanged mild bilateral neuroforaminal stenosis. No
spinal canal stenosis.

C3-C4: Unchanged disc uncovering, mild bilateral uncovertebral
hypertrophy, and severe bilateral facet arthropathy. Unchanged mild
spinal canal and moderate to severe bilateral neuroforaminal
stenosis.

C4-C5: Unchanged disc uncovering, mild bilateral uncovertebral
hypertrophy, and moderate bilateral facet arthropathy. Unchanged
mild bilateral neuroforaminal stenosis. No spinal canal stenosis.

C5-C6: Prior ACDF. Unchanged residual mild bilateral neuroforaminal
stenosis due to uncovertebral hypertrophy. No spinal canal stenosis.

C6-C7: Prior ACDF. Unchanged residual moderate right and mild left
neuroforaminal stenosis due to uncovertebral hypertrophy. No spinal
canal stenosis.

C7-T1: Mildly progressive disc bulging, right greater than left
facet uncovertebral hypertrophy, and ligamentum flavum infolding.
Progressive now moderate spinal canal stenosis and moderate to
severe bilateral neuroforaminal stenosis.

T1-T2: Unchanged mild disc bulging and bilateral facet arthropathy
resulting in mild bilateral neuroforaminal stenosis. No spinal canal
stenosis.
IMPRESSION: 1. Prior C5-C7 ACDF with progressive adjacent segment disease at
C7-T1. Worsened now moderate spinal canal and moderate to severe
bilateral neuroforaminal stenosis.
2. Worsened now moderate spinal canal stenosis at C1-C2 due to
mildly progressive atlanto-occipital and atlanto-axial
osteoarthritis with prominent ligament thickening posterior to the
dens.
3. Unchanged mild spinal canal and moderate to severe bilateral
neuroforaminal stenosis at C3-C4.

## 2020-03-16 DIAGNOSIS — M25562 Pain in left knee: Secondary | ICD-10-CM | POA: Diagnosis not present

## 2020-03-16 DIAGNOSIS — M25561 Pain in right knee: Secondary | ICD-10-CM | POA: Diagnosis not present

## 2020-03-16 DIAGNOSIS — M6281 Muscle weakness (generalized): Secondary | ICD-10-CM | POA: Diagnosis not present

## 2020-03-16 DIAGNOSIS — M5459 Other low back pain: Secondary | ICD-10-CM | POA: Diagnosis not present

## 2020-03-17 ENCOUNTER — Other Ambulatory Visit: Payer: Self-pay

## 2020-03-17 ENCOUNTER — Ambulatory Visit (INDEPENDENT_AMBULATORY_CARE_PROVIDER_SITE_OTHER)
Admission: RE | Admit: 2020-03-17 | Discharge: 2020-03-17 | Disposition: A | Discharge status: Home / Self Care | Payer: PPO | Source: Ambulatory Visit | Attending: Internal Medicine | Admitting: Internal Medicine

## 2020-03-17 DIAGNOSIS — E213 Hyperparathyroidism, unspecified: Secondary | ICD-10-CM | POA: Diagnosis not present

## 2020-03-21 ENCOUNTER — Telehealth: Payer: Self-pay | Admitting: Internal Medicine

## 2020-03-21 NOTE — Telephone Encounter (Signed)
Discussed DXA results with the pt on 03/21/2020    Results:  Lumbar spine L1-L4 Femoral neck (FN) 33% distal radius  T-score -1.9 RFN: -2.3 LFN: -2.4 -4.2  Change in BMD from previous DXA test (%) Down 5.7% Up 2.6% n/a  (*) statistically significant  Assessment: Patient has OSTEOPOROSIS according to the WHO classification for osteoporosis      I have recommended proceeding with Prolia first Discussed side effects of hypocalcemia, local reaction and possible flu like symptoms    Pt in agreement     Burke, MD  [facility]  [facility] [address] [place], [place] [address] Phone: [phone] FAX: [phone]

## 2020-03-22 DIAGNOSIS — M5459 Other low back pain: Secondary | ICD-10-CM | POA: Diagnosis not present

## 2020-03-22 DIAGNOSIS — M6281 Muscle weakness (generalized): Secondary | ICD-10-CM | POA: Diagnosis not present

## 2020-03-22 DIAGNOSIS — M25562 Pain in left knee: Secondary | ICD-10-CM | POA: Diagnosis not present

## 2020-03-22 DIAGNOSIS — M25561 Pain in right knee: Secondary | ICD-10-CM | POA: Diagnosis not present

## 2020-03-23 DIAGNOSIS — Z20828 Contact with and (suspected) exposure to other viral communicable diseases: Secondary | ICD-10-CM | POA: Diagnosis not present

## 2020-03-23 DIAGNOSIS — Z1159 Encounter for screening for other viral diseases: Secondary | ICD-10-CM | POA: Diagnosis not present

## 2020-03-23 NOTE — Telephone Encounter (Signed)
Prolia VOB initiated via [employer] portal.  

## 2020-03-27 NOTE — Telephone Encounter (Signed)
Pt ready for scheduling  Prolia VOB - HTA  Pt cost at time of service: $280  Prolia: 20% co-insurance (roughly $255) Admin fee: 20% co-insurance (roughly $25)  No deductible applies No PA required      PRIMARY MEDICAL BENEFIT DETAILS (PHYSICIAN PURCHASE, OR REFERRAL TO TREATING SITE) COVERAGE AVAILABLE: Yes  COVERAGE DETAILS: We have provided in network benefits only. For the primary MD Purchase option, Prolia and administration are subject to a 20% coinsurance, up to a $3450 out of pocket max ($180 met). No deductible applies. No referral is required. The benefits provided on this Verification of Benefits form are Medical Benefits and are the patient's In-Network benefits for Prolia. If you would like Pharmacy Benefits for Prolia, please call [phone].

## 2020-03-27 NOTE — Telephone Encounter (Signed)
LMTCB to schedule Prolia appt and give her benefit information

## 2020-03-29 DIAGNOSIS — M5459 Other low back pain: Secondary | ICD-10-CM | POA: Diagnosis not present

## 2020-03-29 DIAGNOSIS — M6281 Muscle weakness (generalized): Secondary | ICD-10-CM | POA: Diagnosis not present

## 2020-03-29 DIAGNOSIS — M25562 Pain in left knee: Secondary | ICD-10-CM | POA: Diagnosis not present

## 2020-03-29 DIAGNOSIS — M25561 Pain in right knee: Secondary | ICD-10-CM | POA: Diagnosis not present

## 2020-03-30 DIAGNOSIS — Z1159 Encounter for screening for other viral diseases: Secondary | ICD-10-CM | POA: Diagnosis not present

## 2020-03-30 DIAGNOSIS — Z20828 Contact with and (suspected) exposure to other viral communicable diseases: Secondary | ICD-10-CM | POA: Diagnosis not present

## 2020-04-03 DIAGNOSIS — M6281 Muscle weakness (generalized): Secondary | ICD-10-CM | POA: Diagnosis not present

## 2020-04-03 DIAGNOSIS — M25561 Pain in right knee: Secondary | ICD-10-CM | POA: Diagnosis not present

## 2020-04-03 DIAGNOSIS — M5459 Other low back pain: Secondary | ICD-10-CM | POA: Diagnosis not present

## 2020-04-03 DIAGNOSIS — M25562 Pain in left knee: Secondary | ICD-10-CM | POA: Diagnosis not present

## 2020-04-06 DIAGNOSIS — Z1159 Encounter for screening for other viral diseases: Secondary | ICD-10-CM | POA: Diagnosis not present

## 2020-04-06 DIAGNOSIS — Z20828 Contact with and (suspected) exposure to other viral communicable diseases: Secondary | ICD-10-CM | POA: Diagnosis not present

## 2020-04-13 DIAGNOSIS — Z20828 Contact with and (suspected) exposure to other viral communicable diseases: Secondary | ICD-10-CM | POA: Diagnosis not present

## 2020-04-13 DIAGNOSIS — Z1159 Encounter for screening for other viral diseases: Secondary | ICD-10-CM | POA: Diagnosis not present

## 2020-04-14 ENCOUNTER — Telehealth: Payer: Self-pay

## 2020-04-14 DIAGNOSIS — Z1211 Encounter for screening for malignant neoplasm of colon: Secondary | ICD-10-CM

## 2020-04-14 DIAGNOSIS — M25561 Pain in right knee: Secondary | ICD-10-CM | POA: Diagnosis not present

## 2020-04-14 DIAGNOSIS — M25562 Pain in left knee: Secondary | ICD-10-CM | POA: Diagnosis not present

## 2020-04-14 DIAGNOSIS — M6281 Muscle weakness (generalized): Secondary | ICD-10-CM | POA: Diagnosis not present

## 2020-04-14 DIAGNOSIS — Z87891 Personal history of nicotine dependence: Secondary | ICD-10-CM

## 2020-04-14 DIAGNOSIS — M5459 Other low back pain: Secondary | ICD-10-CM | POA: Diagnosis not present

## 2020-04-14 NOTE — Telephone Encounter (Signed)
Please advise 

## 2020-04-14 NOTE — Telephone Encounter (Signed)
Patient Is requesting orders for a lung scan she stats she gets yearly and a colonoscopy as well  Please advise

## 2020-04-14 NOTE — Telephone Encounter (Signed)
Refer to lung cancer screening program under former smoker  May refer for colonoscopy as well under screen colon cancer

## 2020-04-17 ENCOUNTER — Other Ambulatory Visit: Payer: Self-pay | Admitting: *Deleted

## 2020-04-17 DIAGNOSIS — Z87891 Personal history of nicotine dependence: Secondary | ICD-10-CM

## 2020-04-17 NOTE — Addendum Note (Signed)
Addended by: Gean Birchwood on: 04/17/2020 10:58 AM   Modules accepted: Orders

## 2020-04-17 NOTE — Telephone Encounter (Signed)
Referrals placed, left voicemail for patient to return call to inform her about referrals being sent.

## 2020-04-19 ENCOUNTER — Other Ambulatory Visit: Payer: Self-pay | Admitting: Family Medicine

## 2020-04-19 DIAGNOSIS — M25562 Pain in left knee: Secondary | ICD-10-CM | POA: Diagnosis not present

## 2020-04-19 DIAGNOSIS — M6281 Muscle weakness (generalized): Secondary | ICD-10-CM | POA: Diagnosis not present

## 2020-04-19 DIAGNOSIS — M25561 Pain in right knee: Secondary | ICD-10-CM | POA: Diagnosis not present

## 2020-04-19 DIAGNOSIS — M5459 Other low back pain: Secondary | ICD-10-CM | POA: Diagnosis not present

## 2020-04-20 DIAGNOSIS — Z20828 Contact with and (suspected) exposure to other viral communicable diseases: Secondary | ICD-10-CM | POA: Diagnosis not present

## 2020-04-20 DIAGNOSIS — Z1159 Encounter for screening for other viral diseases: Secondary | ICD-10-CM | POA: Diagnosis not present

## 2020-04-26 DIAGNOSIS — M25561 Pain in right knee: Secondary | ICD-10-CM | POA: Diagnosis not present

## 2020-04-26 DIAGNOSIS — M6281 Muscle weakness (generalized): Secondary | ICD-10-CM | POA: Diagnosis not present

## 2020-04-26 DIAGNOSIS — M25562 Pain in left knee: Secondary | ICD-10-CM | POA: Diagnosis not present

## 2020-04-26 DIAGNOSIS — M5459 Other low back pain: Secondary | ICD-10-CM | POA: Diagnosis not present

## 2020-04-27 DIAGNOSIS — Z20828 Contact with and (suspected) exposure to other viral communicable diseases: Secondary | ICD-10-CM | POA: Diagnosis not present

## 2020-04-27 DIAGNOSIS — Z1159 Encounter for screening for other viral diseases: Secondary | ICD-10-CM | POA: Diagnosis not present

## 2020-05-03 DIAGNOSIS — M25561 Pain in right knee: Secondary | ICD-10-CM | POA: Diagnosis not present

## 2020-05-03 DIAGNOSIS — M25562 Pain in left knee: Secondary | ICD-10-CM | POA: Diagnosis not present

## 2020-05-03 DIAGNOSIS — M6281 Muscle weakness (generalized): Secondary | ICD-10-CM | POA: Diagnosis not present

## 2020-05-03 DIAGNOSIS — M5459 Other low back pain: Secondary | ICD-10-CM | POA: Diagnosis not present

## 2020-05-04 DIAGNOSIS — Z1159 Encounter for screening for other viral diseases: Secondary | ICD-10-CM | POA: Diagnosis not present

## 2020-05-04 DIAGNOSIS — Z20828 Contact with and (suspected) exposure to other viral communicable diseases: Secondary | ICD-10-CM | POA: Diagnosis not present

## 2020-05-05 DIAGNOSIS — M25562 Pain in left knee: Secondary | ICD-10-CM | POA: Diagnosis not present

## 2020-05-05 DIAGNOSIS — M25561 Pain in right knee: Secondary | ICD-10-CM | POA: Diagnosis not present

## 2020-05-05 DIAGNOSIS — M5459 Other low back pain: Secondary | ICD-10-CM | POA: Diagnosis not present

## 2020-05-05 DIAGNOSIS — M6281 Muscle weakness (generalized): Secondary | ICD-10-CM | POA: Diagnosis not present

## 2020-05-10 DIAGNOSIS — M25562 Pain in left knee: Secondary | ICD-10-CM | POA: Diagnosis not present

## 2020-05-10 DIAGNOSIS — M5459 Other low back pain: Secondary | ICD-10-CM | POA: Diagnosis not present

## 2020-05-10 DIAGNOSIS — M25561 Pain in right knee: Secondary | ICD-10-CM | POA: Diagnosis not present

## 2020-05-10 DIAGNOSIS — M6281 Muscle weakness (generalized): Secondary | ICD-10-CM | POA: Diagnosis not present

## 2020-05-11 DIAGNOSIS — Z1159 Encounter for screening for other viral diseases: Secondary | ICD-10-CM | POA: Diagnosis not present

## 2020-05-11 DIAGNOSIS — Z20828 Contact with and (suspected) exposure to other viral communicable diseases: Secondary | ICD-10-CM | POA: Diagnosis not present

## 2020-05-12 DIAGNOSIS — M5459 Other low back pain: Secondary | ICD-10-CM | POA: Diagnosis not present

## 2020-05-12 DIAGNOSIS — M6281 Muscle weakness (generalized): Secondary | ICD-10-CM | POA: Diagnosis not present

## 2020-05-12 DIAGNOSIS — M25561 Pain in right knee: Secondary | ICD-10-CM | POA: Diagnosis not present

## 2020-05-12 DIAGNOSIS — M25562 Pain in left knee: Secondary | ICD-10-CM | POA: Diagnosis not present

## 2020-05-17 DIAGNOSIS — M6281 Muscle weakness (generalized): Secondary | ICD-10-CM | POA: Diagnosis not present

## 2020-05-17 DIAGNOSIS — M25562 Pain in left knee: Secondary | ICD-10-CM | POA: Diagnosis not present

## 2020-05-17 DIAGNOSIS — M25561 Pain in right knee: Secondary | ICD-10-CM | POA: Diagnosis not present

## 2020-05-17 DIAGNOSIS — M5459 Other low back pain: Secondary | ICD-10-CM | POA: Diagnosis not present

## 2020-05-18 DIAGNOSIS — Z1159 Encounter for screening for other viral diseases: Secondary | ICD-10-CM | POA: Diagnosis not present

## 2020-05-18 DIAGNOSIS — Z20828 Contact with and (suspected) exposure to other viral communicable diseases: Secondary | ICD-10-CM | POA: Diagnosis not present

## 2020-05-24 DIAGNOSIS — M25562 Pain in left knee: Secondary | ICD-10-CM | POA: Diagnosis not present

## 2020-05-24 DIAGNOSIS — M5459 Other low back pain: Secondary | ICD-10-CM | POA: Diagnosis not present

## 2020-05-24 DIAGNOSIS — M25561 Pain in right knee: Secondary | ICD-10-CM | POA: Diagnosis not present

## 2020-05-24 DIAGNOSIS — M6281 Muscle weakness (generalized): Secondary | ICD-10-CM | POA: Diagnosis not present

## 2020-05-25 DIAGNOSIS — Z1159 Encounter for screening for other viral diseases: Secondary | ICD-10-CM | POA: Diagnosis not present

## 2020-05-25 DIAGNOSIS — Z20828 Contact with and (suspected) exposure to other viral communicable diseases: Secondary | ICD-10-CM | POA: Diagnosis not present

## 2020-05-30 DIAGNOSIS — M25562 Pain in left knee: Secondary | ICD-10-CM | POA: Diagnosis not present

## 2020-05-30 DIAGNOSIS — M5459 Other low back pain: Secondary | ICD-10-CM | POA: Diagnosis not present

## 2020-05-30 DIAGNOSIS — M25561 Pain in right knee: Secondary | ICD-10-CM | POA: Diagnosis not present

## 2020-05-30 DIAGNOSIS — M6281 Muscle weakness (generalized): Secondary | ICD-10-CM | POA: Diagnosis not present

## 2020-06-01 DIAGNOSIS — Z1159 Encounter for screening for other viral diseases: Secondary | ICD-10-CM | POA: Diagnosis not present

## 2020-06-01 DIAGNOSIS — Z20828 Contact with and (suspected) exposure to other viral communicable diseases: Secondary | ICD-10-CM | POA: Diagnosis not present

## 2020-06-08 DIAGNOSIS — Z20828 Contact with and (suspected) exposure to other viral communicable diseases: Secondary | ICD-10-CM | POA: Diagnosis not present

## 2020-06-08 DIAGNOSIS — Z1159 Encounter for screening for other viral diseases: Secondary | ICD-10-CM | POA: Diagnosis not present

## 2020-06-09 ENCOUNTER — Ambulatory Visit (INDEPENDENT_AMBULATORY_CARE_PROVIDER_SITE_OTHER): Payer: PPO | Admitting: Family Medicine

## 2020-06-09 ENCOUNTER — Other Ambulatory Visit: Payer: Self-pay

## 2020-06-09 ENCOUNTER — Encounter: Payer: Self-pay | Admitting: Family Medicine

## 2020-06-09 VITALS — BP 134/74 | HR 72 | Temp 99.2°F | Ht 64.0 in | Wt 152.6 lb

## 2020-06-09 DIAGNOSIS — F411 Generalized anxiety disorder: Secondary | ICD-10-CM

## 2020-06-09 MED ORDER — BUSPIRONE HCL 5 MG PO TABS: 5.0000 mg | ORAL_TABLET | Freq: Two times a day (BID) | ORAL | 5 refills | Status: DC | PRN

## 2020-06-09 MED ORDER — BUPROPION HCL ER (XL) 150 MG PO TB24: 150.0000 mg | ORAL_TABLET | Freq: Every day | ORAL | 5 refills | Status: DC

## 2020-06-09 NOTE — Progress Notes (Signed)
Phone [phone] In person visit   Subjective:   Crystal Carroll is a 75 y.o. year old very pleasant female patient who presents for/with See problem oriented charting Chief Complaint  Patient presents with  . Medication Discussion     Xanax    This visit occurred during the SARS-CoV-2 public health emergency.  Safety protocols were in place, including screening questions prior to the visit, additional usage of staff PPE, and extensive cleaning of exam room while observing appropriate contact time as indicated for disinfecting solutions.   Past Medical History-  Patient Active Problem List   Diagnosis Date Noted  . History of lobectomy of thyroid 02/24/2019    Priority: High  . 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 Hyperparathyroidism, primary ([place]) 07/17/2018    Priority: High  . History of lacunar cerebrovascular accident (CVA) 06/18/2018    Priority: High  . Caregiver burden 04/03/2016    Priority: High  . Mild aortic stenosis 07/19/2019    Priority: Medium  . Osteoporosis 03/13/2018    Priority: Medium  . DDD (degenerative disc disease), lumbar 03/05/2018    Priority: Medium  . Aortic atherosclerosis ([place]) 06/12/2017    Priority: Medium  . Palpitations 05/12/2017    Priority: Medium  . Vitamin B12 deficiency 05/12/2017    Priority: Medium  . Cervical radiculopathy 04/03/2016    Priority: Medium  . Essential tremor 04/18/2015    Priority: Medium  . Heavy alcohol use 04/14/2014    Priority: Medium  . CKD (chronic kidney disease), stage III ([place]) 10/15/2013    Priority: Medium  . Former smoker 12/24/2006    Priority: Medium  . Hyperlipidemia 05/26/2006    Priority: Medium  . GAD (generalized anxiety disorder) 05/26/2006    Priority: Medium  . Essential hypertension 05/26/2006    Priority: Medium  . Patellofemoral arthritis 03/05/2018    Priority: Low  . Unstable gait 01/20/2018     Priority: Low  . Adenomatous colon polyp 06/14/2014    Priority: Low  . H/O cold sores 10/15/2013    Priority: Low  . GERD 05/26/2006    Priority: Low  . Osteoporosis without current pathological fracture 09/10/2019    Medications- reviewed and updated Current Outpatient Medications  Medication Sig Dispense Refill  . ALPRAZolam (XANAX) 0.25 MG tablet TAKE 1 TABLET DAILY AS NEEDED FOR ANXIETY. 30 tablet 0  . aspirin EC 81 MG tablet Take 81 mg by mouth at bedtime.     Mar[place] Kitchen buPROPion (WELLBUTRIN XL) 150 MG 24 hr tablet Take 1 tablet (150 mg total) by mouth daily. 30 tablet 5  . busPIRone (BUSPAR) 5 MG tablet Take 1 tablet (5 mg total) by mouth 2 (two) times daily as needed (anxiety). 60 tablet 5  . citalopram (CELEXA) 20 MG tablet TAKE 1 TABLET ONCE DAILY. 90 tablet 0  . diclofenac sodium (VOLTAREN) 1 % GEL Apply 2 g topically 4 (four) times daily. 100 g 3  . pantoprazole (PROTONIX) 20 MG tablet TAKE 1 TABLET ONCE DAILY. 90 tablet 0  . rosuvastatin (CRESTOR) 40 MG tablet TAKE 1 TABLET ONCE DAILY. 90 tablet 0  . VITAMIN D PO Take by mouth. Vit D3     No current facility-administered medications for this visit.     Objective:  BP 134/74   Pulse 72   Temp 99.2 F (37.3 C) (Temporal)   Ht 5\' 4"  (1.626 m)   Wt 152 lb 9.6 oz (69.2 kg)  LMP  (LMP Unknown)   SpO2 95% Comment: Patient has acrylic nails on  BMI 24.26 kg/m  Gen: NAD, resting comfortably CV: RRR no murmurs rubs or gallops Lungs: CTAB no crackles, wheeze, rhonchi Ext: no edema Skin: warm, dry     Assessment and Plan  #Social Update: Continues to have difficulty getting has been placed in skilled nursing facility to help with his healing process  #Anxiety/stress management-underlying GAD S: Compliant with Celexa 20 mg, Wellbutrin 300 mg extended release. Also takes sparing Xanax as needed for anxiety-  Unfortunately recently patient has noted she feels rather sedated even with 1/2 tablet of Xanax 0.25 mg/woozy.  Also  can make her slightly irritable  PHQ-9 of 0 today-patient denies depression.  GAD significantly elevated at 11  A/P: Poor control of anxiety/stress.  Since Wellbutrin can be anxiety inducing-reduce wellbutrin dosage from 300mg  to 150mg . -Probably reasonable to still trial alprazolam at bedtime if needed for anxiety but during the daytime I think we should trial buspirone 5 mg up to twice daily as needed- we also discussed she could schedule this if she would like   Recommended follow up: Patient has a follow-up visit in July-I think that is a perfect opportunity to check back in Future Appointments  Date Time Provider [place]  07/20/2020  2:40 PM Marin Olp, MD [facility]  10/30/2020  3:15 PM [facility] HEALTH COACH [facility]  03/12/2021  1:00 PM Shamleffer, Melanie Crazier, MD [facility] None    Lab/Order associations:   ICD-10-CM   1. GAD (generalized anxiety disorder)  F41.1     Meds ordered this encounter  Medications  . buPROPion (WELLBUTRIN XL) 150 MG 24 hr tablet    Sig: Take 1 tablet (150 mg total) by mouth daily.    Dispense:  30 tablet    Refill:  5  . busPIRone (BUSPAR) 5 MG tablet    Sig: Take 1 tablet (5 mg total) by mouth 2 (two) times daily as needed (anxiety).    Dispense:  60 tablet    Refill:  5   I,Jordan Kelly,acting as a scribe for [name], MD.,have documented all relevant documentation on the behalf of [name], MD,as directed by  [name], MD while in the presence of [name], MD.   I, [name], MD, have reviewed all documentation for this visit. The documentation on 06/10/20 for the exam, diagnosis, procedures, and orders are all accurate and complete.   Return precautions advised.  [name], MD

## 2020-06-09 NOTE — Patient Instructions (Addendum)
Health Maintenance Due  Topic Date Due  . Zoster Vaccines- Shingrix (2 of 2) Check with your local pharmacy.  12/30/2018  . COLONOSCOPY- please get this scheduled 05/31/2019   Reduce Wellbutrin/bupropion to 150 mg extended release-sometimes this medicine can be slightly anxiety provoking so perhaps a lower dose will help reduce overall anxiety.  Trial buspirone 5 mg up to twice daily as needed for anxiety- if this is helpful as needed please continue on an as-needed basis.  If you like it but it did not get a full benefit could schedule twice a day  Recommended follow up: keep visit on July 14th

## 2020-06-14 DIAGNOSIS — M5459 Other low back pain: Secondary | ICD-10-CM | POA: Diagnosis not present

## 2020-06-14 DIAGNOSIS — M25561 Pain in right knee: Secondary | ICD-10-CM | POA: Diagnosis not present

## 2020-06-14 DIAGNOSIS — M25562 Pain in left knee: Secondary | ICD-10-CM | POA: Diagnosis not present

## 2020-06-14 DIAGNOSIS — M6281 Muscle weakness (generalized): Secondary | ICD-10-CM | POA: Diagnosis not present

## 2020-06-15 DIAGNOSIS — Z1159 Encounter for screening for other viral diseases: Secondary | ICD-10-CM | POA: Diagnosis not present

## 2020-06-15 DIAGNOSIS — Z20828 Contact with and (suspected) exposure to other viral communicable diseases: Secondary | ICD-10-CM | POA: Diagnosis not present

## 2020-06-18 NOTE — Telephone Encounter (Signed)
Please follow up with pt regarding scheduling Prolia inj.  Thanks!

## 2020-06-22 DIAGNOSIS — Z20828 Contact with and (suspected) exposure to other viral communicable diseases: Secondary | ICD-10-CM | POA: Diagnosis not present

## 2020-06-22 DIAGNOSIS — Z1159 Encounter for screening for other viral diseases: Secondary | ICD-10-CM | POA: Diagnosis not present

## 2020-06-27 NOTE — Telephone Encounter (Signed)
LMTCB to schedule prolia

## 2020-06-28 DIAGNOSIS — M25562 Pain in left knee: Secondary | ICD-10-CM | POA: Diagnosis not present

## 2020-06-28 DIAGNOSIS — M6281 Muscle weakness (generalized): Secondary | ICD-10-CM | POA: Diagnosis not present

## 2020-06-28 DIAGNOSIS — M25561 Pain in right knee: Secondary | ICD-10-CM | POA: Diagnosis not present

## 2020-06-28 DIAGNOSIS — M5459 Other low back pain: Secondary | ICD-10-CM | POA: Diagnosis not present

## 2020-07-06 DIAGNOSIS — Z1159 Encounter for screening for other viral diseases: Secondary | ICD-10-CM | POA: Diagnosis not present

## 2020-07-06 DIAGNOSIS — Z20828 Contact with and (suspected) exposure to other viral communicable diseases: Secondary | ICD-10-CM | POA: Diagnosis not present

## 2020-07-13 DIAGNOSIS — Z1159 Encounter for screening for other viral diseases: Secondary | ICD-10-CM | POA: Diagnosis not present

## 2020-07-13 DIAGNOSIS — Z1231 Encounter for screening mammogram for malignant neoplasm of breast: Secondary | ICD-10-CM | POA: Diagnosis not present

## 2020-07-13 DIAGNOSIS — Z20828 Contact with and (suspected) exposure to other viral communicable diseases: Secondary | ICD-10-CM | POA: Diagnosis not present

## 2020-07-13 LAB — HM MAMMOGRAPHY

## 2020-07-17 ENCOUNTER — Encounter: Payer: Self-pay | Admitting: Family Medicine

## 2020-07-17 IMAGING — DX DG CHEST 2V
2 series · 2 of 2 positions shown · non-contrast
Comparison: 10/20/2013

CLINICAL DATA: Pre-op respiratory exam for parathyroidectomy.

EXAM:
CHEST - 2 VIEW

[chest pa]
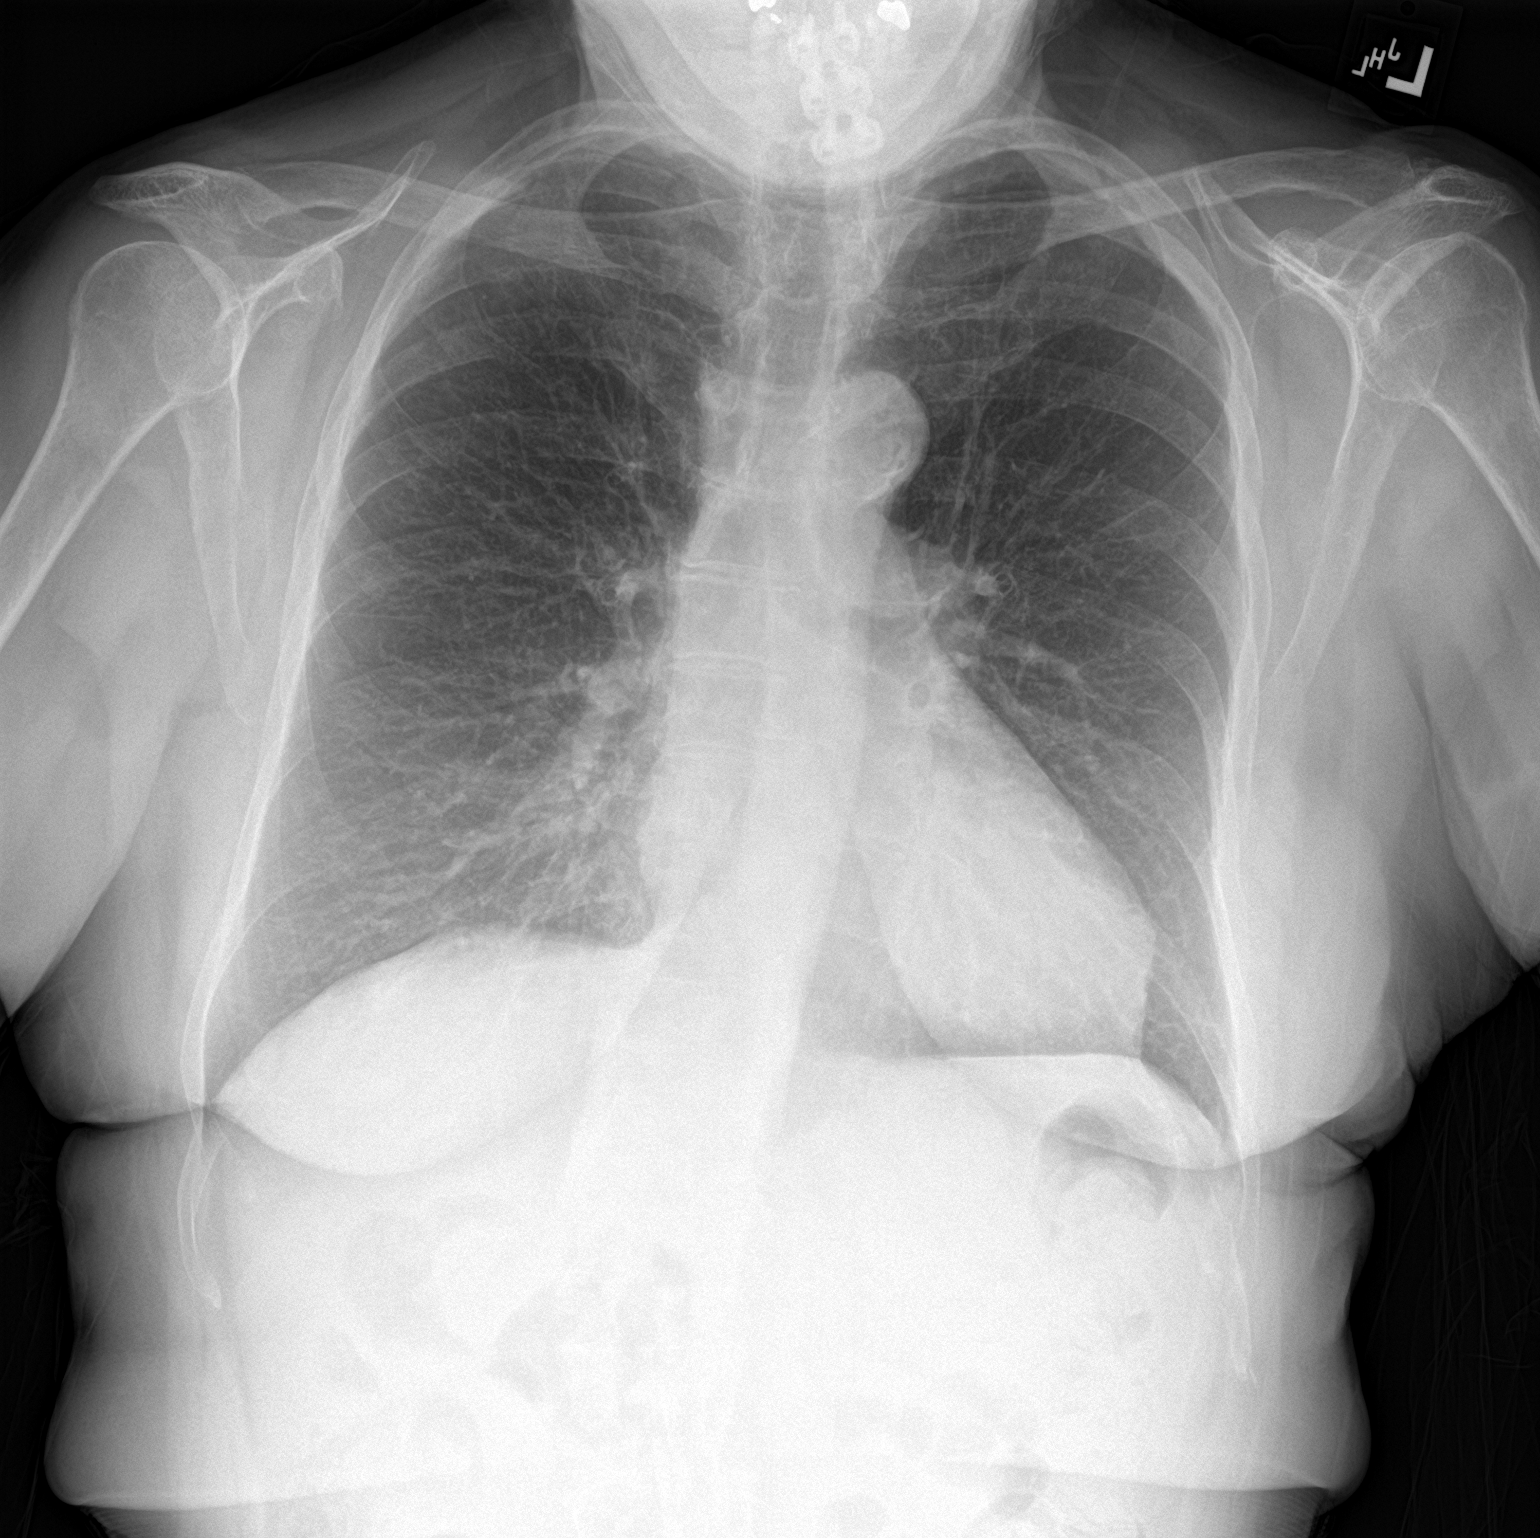

[chest lat]
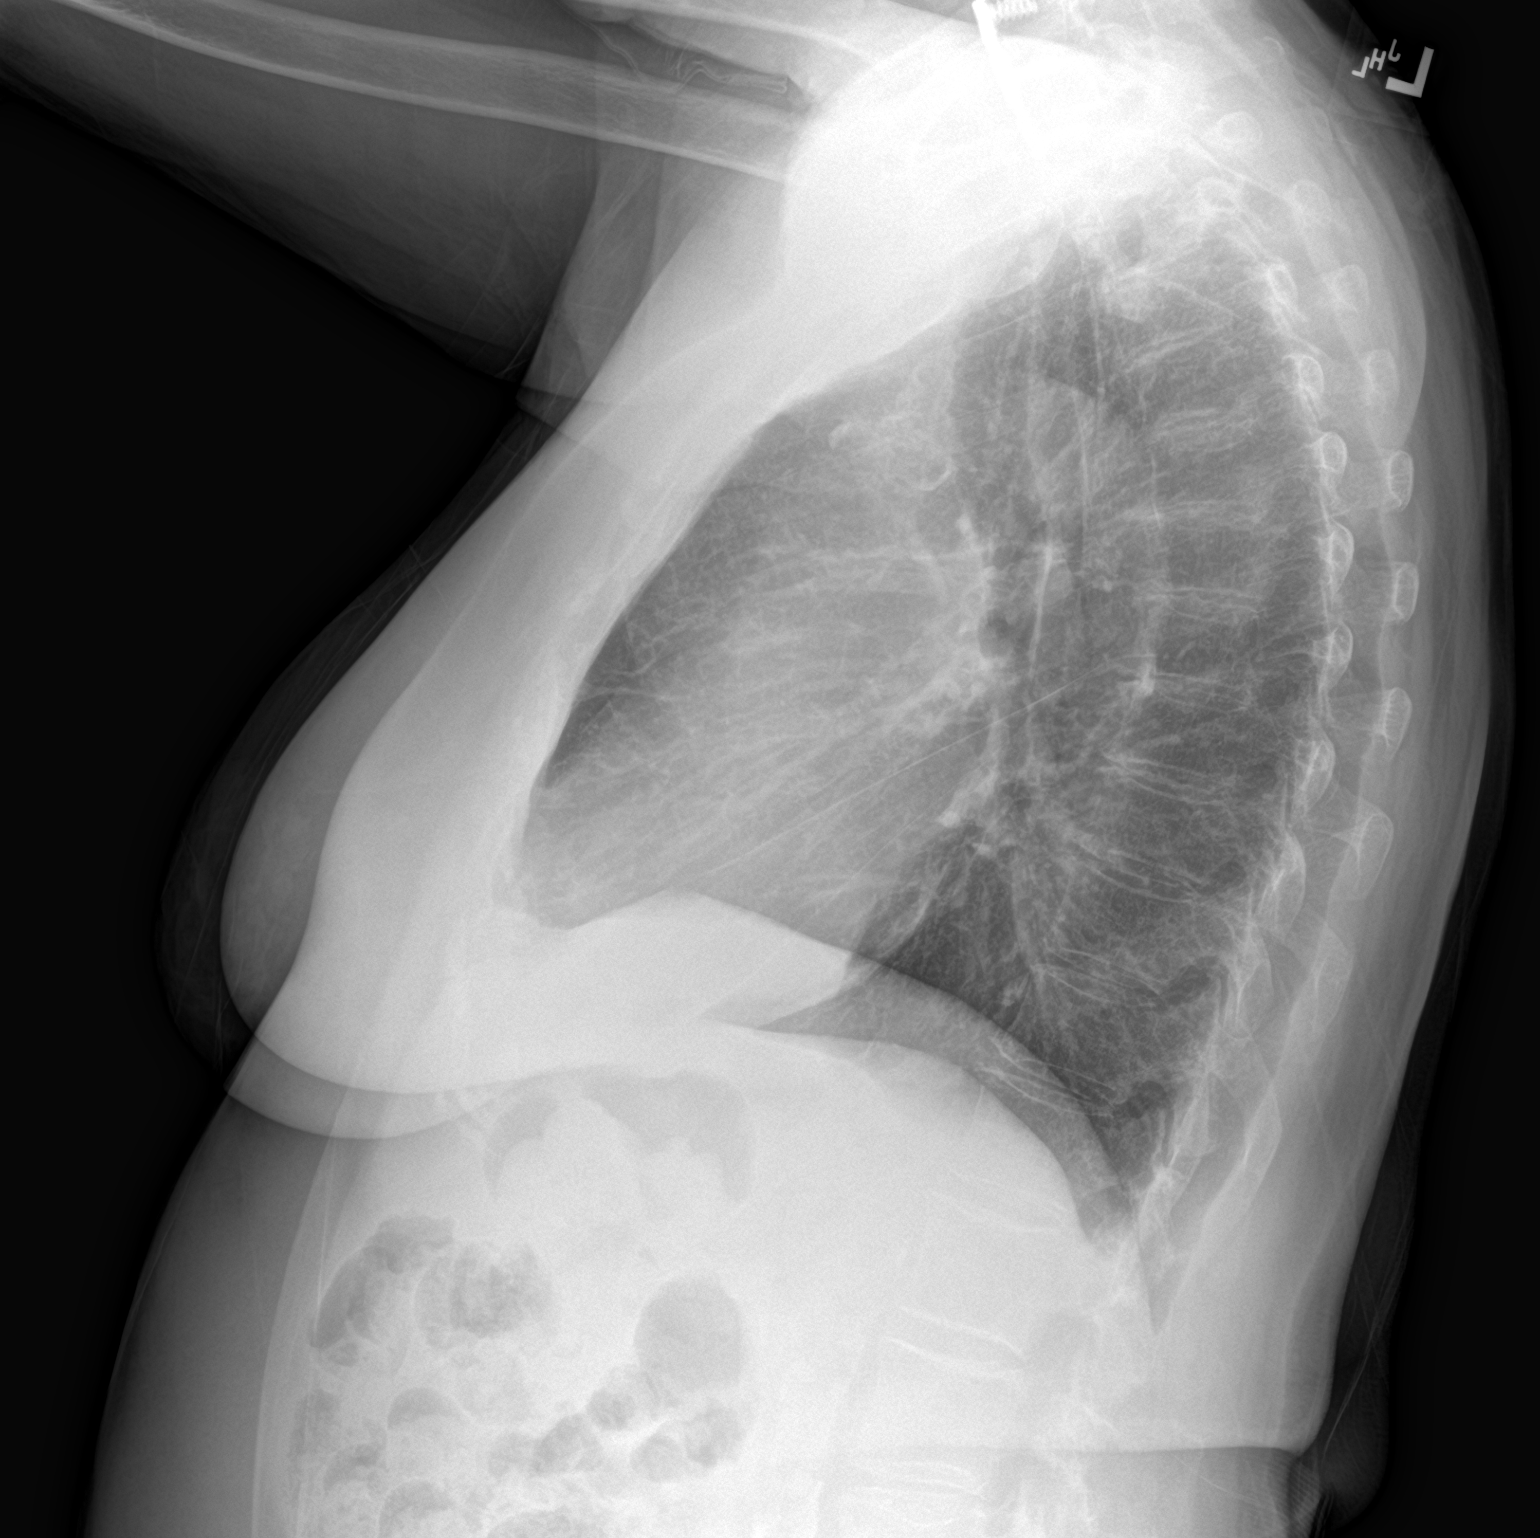

[2 of 2 positions shown; findings below may reference images not displayed]

FINDINGS: The heart size and mediastinal contours are within normal limits.
Aortic atherosclerosis incidentally noted. Both lungs are clear.
Cervical spine fusion hardware again noted.
IMPRESSION: No active cardiopulmonary disease.

## 2020-07-20 ENCOUNTER — Ambulatory Visit (INDEPENDENT_AMBULATORY_CARE_PROVIDER_SITE_OTHER): Payer: PPO | Admitting: Family Medicine

## 2020-07-20 ENCOUNTER — Other Ambulatory Visit: Payer: Self-pay

## 2020-07-20 ENCOUNTER — Other Ambulatory Visit: Payer: Self-pay | Admitting: Family Medicine

## 2020-07-20 ENCOUNTER — Encounter: Payer: Self-pay | Admitting: Family Medicine

## 2020-07-20 VITALS — BP 110/70 | HR 62 | Temp 98.0°F | Ht 62.5 in | Wt 153.2 lb

## 2020-07-20 DIAGNOSIS — M25561 Pain in right knee: Secondary | ICD-10-CM

## 2020-07-20 DIAGNOSIS — G8929 Other chronic pain: Secondary | ICD-10-CM | POA: Diagnosis not present

## 2020-07-20 DIAGNOSIS — Z20828 Contact with and (suspected) exposure to other viral communicable diseases: Secondary | ICD-10-CM | POA: Diagnosis not present

## 2020-07-20 DIAGNOSIS — M25562 Pain in left knee: Secondary | ICD-10-CM | POA: Diagnosis not present

## 2020-07-20 DIAGNOSIS — Z0000 Encounter for general adult medical examination without abnormal findings: Secondary | ICD-10-CM

## 2020-07-20 DIAGNOSIS — E538 Deficiency of other specified B group vitamins: Secondary | ICD-10-CM

## 2020-07-20 DIAGNOSIS — E785 Hyperlipidemia, unspecified: Secondary | ICD-10-CM | POA: Diagnosis not present

## 2020-07-20 DIAGNOSIS — Z1159 Encounter for screening for other viral diseases: Secondary | ICD-10-CM | POA: Diagnosis not present

## 2020-07-20 DIAGNOSIS — I700 Atherosclerosis of aorta: Secondary | ICD-10-CM

## 2020-07-20 DIAGNOSIS — N1831 Chronic kidney disease, stage 3a: Secondary | ICD-10-CM

## 2020-07-20 DIAGNOSIS — R2681 Unsteadiness on feet: Secondary | ICD-10-CM | POA: Diagnosis not present

## 2020-07-20 NOTE — Progress Notes (Signed)
Phone [phone]   Subjective:  Patient presents today for their annual physical. Chief complaint-noted.   See problem oriented charting- ROS- full  review of systems was completed and negative except for: knee pain, unstable gait  The following were reviewed and entered/updated in epic: Past Medical History:  Diagnosis Date   Anxiety    Aortic atherosclerosis ([place])    Aortic valvar stenosis 11/2018   Mild, noted on ECHO   Arthritis    cervical neck   CKD (chronic kidney disease), stage III (HCC)    Stable   GERD (gastroesophageal reflux disease)    History of TIA (transient ischemic attack)    per patient, didn't know about it   Hyperlipidemia    Hyperparathyroidism ([place])    Hypertension    History of    Junctional rhythm    Osteoporosis    Palpitations    Thyroid nodule    2.0 cm solid nodule in the inferior left thyroid lobe    Patient Active Problem List   Diagnosis Date Noted   Osteoporosis without current pathological fracture 09/10/2019    Priority: High   History of lobectomy of thyroid 02/24/2019    Priority: High   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 02/24/2019    Priority: High   Hyperparathyroidism, primary ([place]) 07/17/2018    Priority: High   History of lacunar cerebrovascular accident (CVA) 06/18/2018    Priority: High   Caregiver burden 04/03/2016    Priority: High   Mild aortic stenosis 07/19/2019    Priority: Medium   Osteoporosis 03/13/2018    Priority: Medium   DDD (degenerative disc disease), lumbar 03/05/2018    Priority: Medium   Aortic atherosclerosis ([place]) 06/12/2017    Priority: Medium   Palpitations 05/12/2017    Priority: Medium   Vitamin B12 deficiency 05/12/2017    Priority: Medium   Cervical radiculopathy 04/03/2016    Priority: Medium   Essential tremor 04/18/2015    Priority: Medium   Heavy alcohol use 04/14/2014    Priority: Medium   CKD (chronic kidney disease), stage III  ([place]) 10/15/2013    Priority: Medium   Former smoker 12/24/2006    Priority: Medium   Hyperlipidemia 05/26/2006    Priority: Medium   GAD (generalized anxiety disorder) 05/26/2006    Priority: Medium   Essential hypertension 05/26/2006    Priority: Medium   Patellofemoral arthritis 03/05/2018    Priority: Low   Unstable gait 01/20/2018    Priority: Low   Adenomatous colon polyp 06/14/2014    Priority: Low   H/O cold sores 10/15/2013    Priority: Low   GERD 05/26/2006    Priority: Low   Past Surgical History:  Procedure Laterality Date   CERVICAL LAMINECTOMY  2004   Earle Gell   COLONOSCOPY     ESOPHAGOGASTRODUODENOSCOPY     PARATHYROIDECTOMY Left 01/13/2019   Procedure: LEFT PARATHYROIDECTOMY;  Surgeon: Armandina Gemma, MD;  Location: [facility];  Service: General;  Laterality: Left;   THYROID LOBECTOMY Left 01/13/2019   Procedure: LEFT THYROID LOBECTOMY;  Surgeon: Armandina Gemma, MD;  Location: [facility];  Service: General;  Laterality: Left;    Family History  Problem Relation Age of Onset   Heart attack Father 47       smoker, rheumatic fever as child   Melanoma Mother 44       labia    Medications- reviewed and updated Current Outpatient Medications  Medication Sig Dispense Refill   aspirin EC  81 MG tablet Take 81 mg by mouth at bedtime.      buPROPion (WELLBUTRIN XL) 150 MG 24 hr tablet Take 1 tablet (150 mg total) by mouth daily. 30 tablet 5   busPIRone (BUSPAR) 5 MG tablet Take 1 tablet (5 mg total) by mouth 2 (two) times daily as needed (anxiety). 60 tablet 5   citalopram (CELEXA) 20 MG tablet TAKE 1 TABLET ONCE DAILY. 90 tablet 0   diclofenac sodium (VOLTAREN) 1 % GEL Apply 2 g topically 4 (four) times daily. 100 g 3   pantoprazole (PROTONIX) 20 MG tablet TAKE ONE TABLET BY MOUTH DAILY. 90 tablet 1   rosuvastatin (CRESTOR) 40 MG tablet TAKE 1 TABLET ONCE DAILY. 90 tablet 0   VITAMIN D PO Take by mouth. Vit D3     No current facility-administered medications for this  visit.    Allergies-reviewed and updated Allergies  Allergen Reactions   Codeine Nausea Only   Sulfamethoxazole     REACTION: GI upset    Social History   Social History Narrative   Married to [employer] (patient) in 1981. 2nd marriage-1 child with 1 adopted grandchild.       Retired as Statistician.       Hobbies: antiques, former Firefighter      Exercise: none currently.       Patient is right-handed. She lives with her husband in a two level home. She does not exercise.      Objective  Objective:  BP 110/70 (BP Location: Left Arm, Patient Position: Sitting, Cuff Size: Normal)   Pulse 62   Temp 98 F (36.7 C) (Temporal)   Ht 5' 2.5" (1.588 m)   Wt 153 lb 4 oz (69.5 kg)   LMP  (LMP Unknown)   SpO2 97%   BMI 27.58 kg/m  Gen: NAD, resting comfortably HEENT: Mucous membranes are moist. Oropharynx normal Neck: no thyromegaly CV: RRR no murmurs rubs or gallops Lungs: CTAB no crackles, wheeze, rhonchi Abdomen: soft/nontender/nondistended/normal bowel sounds. No rebound or guarding.  Ext: no edema Skin: warm, dry Neuro: appeared uncomfortable getting onto table- needed assist by me and still staggered onto stair with gait- was able to get down with lighter assist, moves all extremities, PERRLA   Assessment and Plan   75 y.o. female presenting for annual physical.  Health Maintenance counseling: 1. Anticipatory guidance: Patient counseled regarding regular dental exams -q6 months, eye exams -yearly,  avoiding smoking and second hand smoke , limiting alcohol to 1 beverage per day- has been limiting to spritzer's 2 a day - only has 2 oz of wines in each beverage so under 4 oz per day - has done much better 2. Risk factor reduction:  Advised patient of need for regular exercise and diet rich and fruits and vegetables to reduce risk of heart attack and stroke. Exercise- limited by knee pain and unstable gait- will refer back to PT Diet-does feel as if she eats  healthy. Weight down 4 lbs from last year. Trying to eat reasonably healthy Wt Readings from Last 3 Encounters:  07/20/20 153 lb 4 oz (69.5 kg)  06/09/20 152 lb 9.6 oz (69.2 kg)  03/10/20 152 lb 2 oz (69 kg)  3. Immunizations/screenings/ancillary studies- Shingrix #2 advised and COVID-19 #4 planning to wait until fall- otherwise up-to-date. Immunization History  Administered Date(s) Administered   Fluad Quad(high Dose 65+) 09/28/2018, 01/18/2020   Influenza Split 12/03/2010, 12/09/2011   Influenza Whole 10/18/2009   Influenza, High Dose  Seasonal PF 11/02/2012, 10/18/2015, 10/21/2016, 10/21/2017   Influenza,inj,Quad PF,6+ Mos 10/15/2013, 12/06/2014   Influenza-Unspecified 10/08/2015   PFIZER(Purple Top)SARS-COV-2 Vaccination 01/28/2019, 02/18/2019, 12/16/2019   Pneumococcal Conjugate-13 04/15/2014   Pneumococcal Polysaccharide-23 12/03/2010   Td 01/07/1998, 10/18/2009   Tdap 10/18/2016   Zoster Recombinat (Shingrix) 11/04/2018   Zoster, Live 10/18/2009  4. Cervical cancer screening- gynecology yearly but past age based screening recs- likes to continue due to family history melanoma in GU region 5. Breast cancer screening-  breast exam self exam monthly- and follow-up with GYN and mammogram last on  07/13/20 6. Colon cancer screening - last 05/31/2014 with a 5-year repeat planned- over due since 05/31/2019- she plans to call 7. Skin cancer screening- sees Dr. Derrel Nip.advised regular sunscreen use. Denies worrisome, changing, or new skin lesions.  8. Birth control/STD check- monogamous/postmenopausal 9. Osteoporosis screening at 56- last 03/17/2020 with Dr. Kelton Pillar -former smoker - quit 5 years ago. Smoked a pack or over a day. Enrolled in lung cancer screening program- she will call to schedule this  Status of chronic or acute concerns   Social update-continue stress try to get husband into a stronger position-we have tried on multiple occasions to get him into skilled nursing  facility for rehab-patient reports even after we submitted another appeal but this doesn't work.    #GAD/stress management S: Patient with poor control of stress/anxiety at last visit-since Wellbutrin bmet anxiety provoking we opted to reduce dose to 150 mg at last visit.  Opted to try buspirone 2 mg up to twice daily as needed for daytime anxiety-at the same time could still use Xanax as needed before bed-she was feeling sedated with daytime use even 0.25 mg  Patient has also maintained her Celexa 20 mg A/P: GAD improved on celexa 20 mg, wellbutrin down to 150mg  from 300mg  with addition of buspirone. Hasnt needed xanax for sleep- since negatively affected her in the past.    #Hyperlipidemia/aortic atherosclerosis/ history of stroke- on aspirin 81 mg with LDL goal under 70 S: Compliant with rosuvastatin 40 mg.  -Aortic atherosclerosis noted CT 06/26/2017 for lung cancer screening. Risk factor modification with blood pressure and lipid control plan. Lab Results  Component Value Date   CHOL 146 07/19/2019   HDL 57.30 07/19/2019   LDLCALC 49 07/19/2019   LDLDIRECT 106 (H) 07/19/2016   TRIG 194.0 (H) 07/19/2019   CHOLHDL 3 07/19/2019  A/P:For aortic atherosclerosis continue risk factor modification.  For hyperlipidemia update lipid panel at this time with goal under 70 for appropriate risk factor modification  #Hypertension/CKD stage III S: Patient remains off lisinopril unfortunately blood pressure remains controlled  -GFR has been stable largely in the 40s lately- in the past had been in the 30s BP Readings from Last 3 Encounters:  07/20/20 110/70  06/09/20 134/74  03/10/20 124/86  A/P: Blood pressure well controlled.  Continue without medications.  For CKD stage III-hopefully stable-update CMP today  #Primary hyperparathyroidism status post parathyroidectomy January 2021 #History of thyroid nodule found to be noninvasive follicular neoplasm of thyroid with papillary-like nuclear features  now status post lobectomy of thyroid January 2021 S: Hyperparathyroidism likely led to osteoporosis-see discussion below. Status post parathyroidectomy. Follows with Dr. Kelton Pillar last visit March 2022   Thyroid nodule removed at same time as parathyroidectomy A/P: overall stable- continue follow up with Dr. Kelton Pillar   #Osteoporosis S: Patient was placed on Fosamax once a week in the past but current plan for treatment is parathyroidectomy in light of hyperparathyroidism/high calcium A/P: patient  states now due for prolia - has been hard in trying to care for husband and not able to care for herself   #Unstable gait- stable prior extensive workup including Cts/MRIs- overall stable recently. Using cane and that is helpful. Has worked with PT. Patient feels she has become less stable on her feet recently- knees are bothering her due to pain- offered referral back to PT- she agrees.   #GERD S: Compliant with Protonix 40 mg-->20mg  and had to add pepcid with dinner A/P: reasonable control- continue current meds   # B12 deficiency S: Current treatment/medication (oral vs. IM): cyanocobalamin 1000 mcg   Lab Results  Component Value Date   VITAMINB12 263 07/19/2019   A/P: update b12 with labs- hopefullys table     Recommended follow up: No follow-ups on file. Future Appointments  Date Time Provider [place]  08/11/2020  1:00 PM Marin Olp, MD [facility]  10/30/2020  3:15 PM [facility] HEALTH COACH [facility]  03/12/2021  1:00 PM Shamleffer, Melanie Crazier, MD [facility] None   Lab/Order associations: NOT fasting   ICD-10-CM   1. Preventative health care  Z00.00     2. Aortic atherosclerosis (HCC) Chronic I70.0     3. Vitamin B12 deficiency  E53.8 B12    4. Hyperlipidemia, unspecified hyperlipidemia type  E78.5 CBC with Differential/Platelet    Comprehensive metabolic panel    Lipid panel    5. Stage 3a chronic kidney disease (HCC)  N18.31     6. Chronic pain  of both knees  M25.561 Ambulatory referral to Physical Therapy   M25.562    G89.29     7. Unstable gait  R26.81 Ambulatory referral to Physical Therapy      No orders of the defined types were placed in this encounter.  I,Harris Phan,acting as a Education administrator for [name], MD.,have documented all relevant documentation on the behalf of [name], MD,as directed by  [name], MD while in the presence of [name], MD.  I, [name], MD, have reviewed all documentation for this visit. The documentation on 07/20/20 for the exam, diagnosis, procedures, and orders are all accurate and complete.  Return precautions advised.  [name], MD

## 2020-07-20 NOTE — Patient Instructions (Addendum)
Please stop by lab before you go If you have mychart- we will send your results within 3 business days of [place] receiving them.  If you do not have mychart- we will call you about results within 5 business days of [place] receiving them.  *please also note that you will see labs on mychart as soon as they post. I will later go in and write notes on them- will say "notes from Dr. [facility] Norris GI contact Address: [address], [place], [place] [address] Phone: [phone]  We will call you within two weeks about your referral to [facility].  If you do not hear within 2 weeks, give [place] a call.   Team please add Cyanocobalamin/Vitamin B-12 1000 mcg to medications list.  Call to schedule lung cancer screening follow-up.

## 2020-07-21 LAB — LIPID PANEL
Cholesterol: 176 mg/dL (ref 0–200)
HDL: 62.7 mg/dL (ref >39.00)
LDL Cholesterol: 94 mg/dL (ref 0–99)
NonHDL: 113.4
Total CHOL/HDL Ratio: 3
Triglycerides: 97 mg/dL (ref 0.0–149.0)
VLDL: 19.4 mg/dL (ref 0.0–40.0)

## 2020-07-21 LAB — CBC WITH DIFFERENTIAL/PLATELET
Basophils Absolute: 0.1 10*3/uL (ref 0.0–0.1)
Basophils Relative: 1.3 % (ref 0.0–3.0)
Eosinophils Absolute: 0.2 10*3/uL (ref 0.0–0.7)
Eosinophils Relative: 3.5 % (ref 0.0–5.0)
HCT: 38.3 % (ref 36.0–46.0)
Hemoglobin: 13.3 g/dL (ref 12.0–15.0)
Lymphocytes Relative: 18.9 % (ref 12.0–46.0)
Lymphs Abs: 1.2 10*3/uL (ref 0.7–4.0)
MCHC: 34.8 g/dL (ref 30.0–36.0)
MCV: 85.9 fl (ref 78.0–100.0)
Monocytes Absolute: 0.6 10*3/uL (ref 0.1–1.0)
Monocytes Relative: 8.8 % (ref 3.0–12.0)
Neutro Abs: 4.4 10*3/uL (ref 1.4–7.7)
Neutrophils Relative %: 67.5 % (ref 43.0–77.0)
Platelets: 193 10*3/uL (ref 150.0–400.0)
RBC: 4.46 Mil/uL (ref 3.87–5.11)
RDW: 14 % (ref 11.5–15.5)
WBC: 6.5 10*3/uL (ref 4.0–10.5)

## 2020-07-21 LAB — COMPREHENSIVE METABOLIC PANEL
ALT: 7 U/L (ref 0–35)
AST: 13 U/L (ref 0–37)
Albumin: 4.6 g/dL (ref 3.5–5.2)
Alkaline Phosphatase: 64 U/L (ref 39–117)
BUN: 20 mg/dL (ref 6–23)
CO2: 28 mEq/L (ref 19–32)
Calcium: 9.6 mg/dL (ref 8.4–10.5)
Chloride: 103 mEq/L (ref 96–112)
Creatinine, Ser: 1.29 mg/dL — ABNORMAL HIGH (ref 0.40–1.20)
GFR: 40.52 mL/min — ABNORMAL LOW (ref >60.00)
Glucose, Bld: 78 mg/dL (ref 70–99)
Potassium: 4.1 mEq/L (ref 3.5–5.1)
Sodium: 140 mEq/L (ref 135–145)
Total Bilirubin: 0.6 mg/dL (ref 0.2–1.2)
Total Protein: 7.2 g/dL (ref 6.0–8.3)

## 2020-07-21 LAB — VITAMIN B12: Vitamin B-12: 430 pg/mL (ref 211–911)

## 2020-07-25 ENCOUNTER — Other Ambulatory Visit: Payer: Self-pay | Admitting: Family Medicine

## 2020-07-25 NOTE — Telephone Encounter (Signed)
Pt archived in [email or web address]  Please advise if patient/provider wishes to proceed with Prolia therapy.

## 2020-07-27 ENCOUNTER — Encounter: Payer: Self-pay | Admitting: Gastroenterology

## 2020-07-27 DIAGNOSIS — Z20828 Contact with and (suspected) exposure to other viral communicable diseases: Secondary | ICD-10-CM | POA: Diagnosis not present

## 2020-07-27 DIAGNOSIS — Z1159 Encounter for screening for other viral diseases: Secondary | ICD-10-CM | POA: Diagnosis not present

## 2020-07-28 ENCOUNTER — Other Ambulatory Visit: Payer: Self-pay

## 2020-07-28 MED ORDER — EZETIMIBE 10 MG PO TABS: 10.0000 mg | ORAL_TABLET | Freq: Every day | ORAL | 3 refills | Status: DC

## 2020-08-01 ENCOUNTER — Telehealth: Payer: Self-pay

## 2020-08-01 ENCOUNTER — Other Ambulatory Visit: Payer: Self-pay

## 2020-08-01 DIAGNOSIS — D126 Benign neoplasm of colon, unspecified: Secondary | ICD-10-CM

## 2020-08-01 NOTE — Telephone Encounter (Signed)
Awesome! Thank you!

## 2020-08-01 NOTE — Telephone Encounter (Signed)
She has been scheduled at [facility] for 08/15/20 11:30.  She will need to arrive at 10:00 am

## 2020-08-01 NOTE — Telephone Encounter (Signed)
Ok thank you 

## 2020-08-01 NOTE — Telephone Encounter (Signed)
Pt sched for colon in the [facility] on 08/17/20.  Pt has hx of aortic stenosis, just checking if she is cleared for [place], thanks, Westley Hummer

## 2020-08-02 ENCOUNTER — Telehealth: Payer: Self-pay

## 2020-08-02 NOTE — Telephone Encounter (Signed)
Patient states she was exposed to [place] during a luncheon yesterday.    Patient states she is currently having no systems.  Is requesting a call back to discuss exposure.

## 2020-08-02 NOTE — Telephone Encounter (Signed)
Called and lm for pt tcb. 

## 2020-08-03 DIAGNOSIS — Z1159 Encounter for screening for other viral diseases: Secondary | ICD-10-CM | POA: Diagnosis not present

## 2020-08-03 DIAGNOSIS — Z20828 Contact with and (suspected) exposure to other viral communicable diseases: Secondary | ICD-10-CM | POA: Diagnosis not present

## 2020-08-08 ENCOUNTER — Ambulatory Visit (AMBULATORY_SURGERY_CENTER): Payer: Self-pay | Admitting: *Deleted

## 2020-08-08 ENCOUNTER — Other Ambulatory Visit: Payer: Self-pay

## 2020-08-08 VITALS — Ht 62.5 in | Wt 153.0 lb

## 2020-08-08 DIAGNOSIS — Z86010 Personal history of colonic polyps: Secondary | ICD-10-CM

## 2020-08-08 MED ORDER — NA SULFATE-K SULFATE-MG SULF 17.5-3.13-1.6 GM/177ML PO SOLN: 1.0000 | ORAL | 0 refills | Status: DC

## 2020-08-08 NOTE — Progress Notes (Signed)
Patient is here in-person for PV. Patient denies any allergies to eggs or soy. Patient denies any problems with anesthesia/sedation. Patient denies any oxygen use at home. Patient denies taking any diet/weight loss medications or blood thinners. Patient is aware of our care-partner policy and 0000000 safety protocol.   EMMI education assigned to the patient for the procedure, sent to [facility].   Patient is COVID-19 vaccinated, per patient.

## 2020-08-09 ENCOUNTER — Other Ambulatory Visit: Payer: Self-pay

## 2020-08-10 ENCOUNTER — Telehealth: Payer: Self-pay | Admitting: Gastroenterology

## 2020-08-10 DIAGNOSIS — Z20828 Contact with and (suspected) exposure to other viral communicable diseases: Secondary | ICD-10-CM | POA: Diagnosis not present

## 2020-08-10 DIAGNOSIS — Z1159 Encounter for screening for other viral diseases: Secondary | ICD-10-CM | POA: Diagnosis not present

## 2020-08-10 NOTE — Telephone Encounter (Signed)
Pt needs to r/s her procedure that is scheduled on 08/15/20 at [facility] hospital due to an unexpected medical emergency with her husband, who has parkinson's disease. Pls call her.

## 2020-08-10 NOTE — Telephone Encounter (Signed)
Patient is notified that will need to put her on a list for the next available date at the hospital.

## 2020-08-11 ENCOUNTER — Other Ambulatory Visit: Payer: Self-pay

## 2020-08-11 ENCOUNTER — Ambulatory Visit (INDEPENDENT_AMBULATORY_CARE_PROVIDER_SITE_OTHER): Payer: PPO | Admitting: Family Medicine

## 2020-08-11 ENCOUNTER — Encounter: Payer: Self-pay | Admitting: Family Medicine

## 2020-08-11 VITALS — BP 130/78 | HR 60 | Temp 98.3°F | Ht 62.5 in | Wt 154.0 lb

## 2020-08-11 DIAGNOSIS — I10 Essential (primary) hypertension: Secondary | ICD-10-CM | POA: Diagnosis not present

## 2020-08-11 DIAGNOSIS — Z636 Dependent relative needing care at home: Secondary | ICD-10-CM

## 2020-08-11 DIAGNOSIS — I700 Atherosclerosis of aorta: Secondary | ICD-10-CM

## 2020-08-11 DIAGNOSIS — E785 Hyperlipidemia, unspecified: Secondary | ICD-10-CM | POA: Diagnosis not present

## 2020-08-11 DIAGNOSIS — N183 Chronic kidney disease, stage 3 unspecified: Secondary | ICD-10-CM

## 2020-08-11 DIAGNOSIS — F439 Reaction to severe stress, unspecified: Secondary | ICD-10-CM | POA: Diagnosis not present

## 2020-08-11 DIAGNOSIS — F419 Anxiety disorder, unspecified: Secondary | ICD-10-CM | POA: Diagnosis not present

## 2020-08-11 NOTE — Progress Notes (Signed)
Phone [phone] In person visit   Subjective:   Crystal Carroll is a 75 y.o. year old very pleasant female patient who presents for/with See problem oriented charting Chief Complaint  Patient presents with   caregiver burden   This visit occurred during the SARS-CoV-2 public health emergency.  Safety protocols were in place, including screening questions prior to the visit, additional usage of staff PPE, and extensive cleaning of exam room while observing appropriate contact time as indicated for disinfecting solutions.   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 02/24/2019    Priority: High   Hyperparathyroidism, primary ([place]) 07/17/2018    Priority: High   History of lacunar cerebrovascular accident (CVA) 06/18/2018    Priority: High   Caregiver burden 04/03/2016    Priority: High   Mild aortic stenosis 07/19/2019    Priority: Medium   Osteoporosis 03/13/2018    Priority: Medium   DDD (degenerative disc disease), lumbar 03/05/2018    Priority: Medium   Aortic atherosclerosis ([place]) 06/12/2017    Priority: Medium   Palpitations 05/12/2017    Priority: Medium   Vitamin B12 deficiency 05/12/2017    Priority: Medium   Cervical radiculopathy 04/03/2016    Priority: Medium   Essential tremor 04/18/2015    Priority: Medium   Heavy alcohol use 04/14/2014    Priority: Medium   CKD (chronic kidney disease), stage III ([place]) 10/15/2013    Priority: Medium   Former smoker 12/24/2006    Priority: Medium   Hyperlipidemia 05/26/2006    Priority: Medium   GAD (generalized anxiety disorder) 05/26/2006    Priority: Medium   Essential hypertension 05/26/2006    Priority: Medium   Patellofemoral arthritis 03/05/2018     Priority: Low   Unstable gait 01/20/2018    Priority: Low   Adenomatous colon polyp 06/14/2014    Priority: Low   H/O cold sores 10/15/2013    Priority: Low   GERD 05/26/2006    Priority: Low    Medications- reviewed and updated Current Outpatient Medications  Medication Sig Dispense Refill   aspirin EC 81 MG tablet Take 81 mg by mouth at bedtime.      buPROPion (WELLBUTRIN XL) 150 MG 24 hr tablet Take 1 tablet (150 mg total) by mouth daily. 30 tablet 5   busPIRone (BUSPAR) 5 MG tablet Take 1 tablet (5 mg total) by mouth 2 (two) times daily as needed (anxiety). 60 tablet 5   Cholecalciferol (VITAMIN D3) 50 MCG (2000 UT) TABS Take 2,000 Units by mouth daily.     citalopram (CELEXA) 20 MG tablet TAKE 1 TABLET ONCE DAILY. (Patient taking differently: Take 20 mg by mouth daily.) 90 tablet 0   diclofenac sodium (VOLTAREN) 1 % GEL Apply 2 g topically 4 (four) times daily. (Patient taking differently: Apply 2 g topically at bedtime as needed (pain).) 100 g 3   Na Sulfate-K Sulfate-Mg Sulf 17.5-3.13-1.6 GM/177ML SOLN Take 1 kit by mouth as directed. Generic suprep-take as directed 354 mL 0   pantoprazole (PROTONIX) 20 MG tablet TAKE ONE TABLET BY MOUTH DAILY. (Patient taking differently: Take 20 mg by mouth daily.) 90 tablet 1   rosuvastatin (CRESTOR) 40 MG tablet TAKE ONE TABLET BY MOUTH DAILY. (Patient taking differently: Take 40  mg by mouth daily.) 90 tablet 0   vitamin B-12 (CYANOCOBALAMIN) 1000 MCG tablet Take 1,000 mcg by mouth daily.     No current facility-administered medications for this visit.     Objective:  BP 130/78   Pulse 60   Temp 98.3 F (36.8 C) (Temporal)   Ht 5' 2.5" (1.588 m)   Wt 154 lb (69.9 kg)   LMP  (LMP Unknown)   SpO2 98%   BMI 27.72 kg/m  Gen: NAD, resting comfortably CV: RRR no murmurs rubs or gallops Lungs: CTAB no crackles, wheeze, rhonchi Ext: no edema Skin: warm, dry Neuro:  walks with cane    Assessment and Plan   #Hyperlipidemia/aortic  atherosclerosis/ history of stroke- on aspirin 81 mg by bedtime with LDL goal under 70 S: Compliant with rosuvastatin 40 mg daily - had run out for a period before we had labs done- as noted below- time for self care has diminished as trying to deal with insurance and get care for husband-this pushed out when she was able to pick up medicine -Aortic atherosclerosis noted CT 06/26/2017 for lung cancer screening. Risk factor modification with blood pressure and lipid control plan. Lab Results  Component Value Date   CHOL 176 07/20/2020   HDL 62.70 07/20/2020   LDLCALC 94 07/20/2020   LDLDIRECT 106 (H) 07/19/2016   TRIG 97.0 07/20/2020   CHOLHDL 3 07/20/2020   A/P: LDL above goal for someone with aortic atherosclerosis and history of stroke with goal 70 or less-encouraged regular compliance with medicine and also encouraged self-care-we are going to try to help with her husband situation so she can have time to pick up her own medicines -Cholesterol previously well controlled on rosuvastatin regularly-we had sent in zetia but now given info of missed doses- we are going to stop zetia and recheck lipids next visit  #Hypertension/CKD stage III S: Patient remained off lisinopril unfortunately blood pressure remains controlled  -GFR has been stable largely in the 40s lately- in the past had been in the 30s BP Readings from Last 3 Encounters:  08/11/20 130/78  07/20/20 110/70  06/09/20 134/74  A/P: excellent control without meds- continue to monitor without medicine  CKD III has been stable with GFR in 40s over last few years  #Anxiety/stress management/caregiver burden S: Compliant with Celexa 20 mg daily , Wellbutrin $RemoveBefo'150mg'UuBdPLwymmC$  XR (reduced from $RemoveBefor'300mg'NDNfiRRZcpyf$  due to anxiety concern). Buspirone 5 mg BID has been helpful thankfully -Prior on xanax but had started to feel oversedated.   Patients main frustration is with insurance and inability to get appropriate care for husband.  Patient had set up with  [facility] for transport Monday, wed, Friday - but spoke with PT and unless he is getting this done daily unlikely to help him- needs daily changes- she physically is not able to handle weight of his legs to do changes at home. We have been denied x2 SNF admission to help him.  Home health has no one for lymphedema management. Even to get him ready to get him out on MWF would be beyond challenging for patient.   When she found out that the situation with physical therapy/lymphedema clinic was unlikely to help this was extremely discouraging-she even canceled her colonoscopy due to the frustration of recent events-she has rescheduled but this is pushing out her self-care dealing with the stress of her insurance company A/P: All things considered in the amount of stress patient is under-I think she is doing reasonably well.  The main  issue is situational stress not being able to get her husband cared for appropriately-I counseled her and tried to comfort her today and we will do our best to help her husband as well which I think is the main thing that needs to be done to help her-reaching out to her insurance company to see if we can move the needle. Continue current meds  Recommended follow up: No follow-ups on file. Future Appointments  Date Time Provider [place]  10/20/2020  2:20 PM Marin Olp, MD [facility]  10/30/2020  3:15 PM [facility] HEALTH COACH [facility]  03/12/2021  1:00 PM Shamleffer, Melanie Crazier, MD [facility] None   Lab/Order associations:   ICD-10-CM   1. Caregiver burden  Z63.6     2. Anxiety  F41.9     3. Stress  F43.9     4. Aortic atherosclerosis (HCC)  I70.0     5. Hyperlipidemia, unspecified hyperlipidemia type  E78.5     6. Essential hypertension  I10     7. Stage 3 chronic kidney disease, unspecified whether stage 3a or 3b CKD ([place])  N18.30       I,Jada Bradford,acting as a scribe for [name], MD.,have documented all relevant  documentation on the behalf of [name], MD,as directed by  [name], MD while in the presence of [name], MD.  I, [name], MD, have reviewed all documentation for this visit. The documentation on 08/11/20 for the exam, diagnosis, procedures, and orders are all accurate and complete.   Return precautions advised.  [name], MD

## 2020-08-11 NOTE — Patient Instructions (Addendum)
Health Maintenance Due  Topic Date Due   COLONOSCOPY   Plans for fall 05/31/2019   COVID-19 Vaccine (4 - Booster for [employer] series)  Reasonable to get 4th shot 04/15/2020   INFLUENZA VACCINE - let us know when you get fall flu shot 08/07/2020   Prevnar 20 -remind me next visit  No changes today- hoping the email helps  Recommended follow up: keep October visit

## 2020-08-15 ENCOUNTER — Ambulatory Visit (HOSPITAL_COMMUNITY): Admission: RE | Admit: 2020-08-15 | Payer: PPO | Source: Home / Self Care | Admitting: Gastroenterology

## 2020-08-15 ENCOUNTER — Other Ambulatory Visit: Payer: Self-pay | Admitting: Family Medicine

## 2020-08-15 SURGERY — COLONOSCOPY WITH PROPOFOL
Anesthesia: Monitor Anesthesia Care

## 2020-08-16 DIAGNOSIS — Z20828 Contact with and (suspected) exposure to other viral communicable diseases: Secondary | ICD-10-CM | POA: Diagnosis not present

## 2020-08-17 ENCOUNTER — Encounter: Payer: PPO | Admitting: Gastroenterology

## 2020-08-18 DIAGNOSIS — M5459 Other low back pain: Secondary | ICD-10-CM | POA: Diagnosis not present

## 2020-08-18 DIAGNOSIS — M25562 Pain in left knee: Secondary | ICD-10-CM | POA: Diagnosis not present

## 2020-08-18 DIAGNOSIS — R2681 Unsteadiness on feet: Secondary | ICD-10-CM | POA: Diagnosis not present

## 2020-08-18 DIAGNOSIS — M25561 Pain in right knee: Secondary | ICD-10-CM | POA: Diagnosis not present

## 2020-08-21 DIAGNOSIS — L821 Other seborrheic keratosis: Secondary | ICD-10-CM | POA: Diagnosis not present

## 2020-08-21 DIAGNOSIS — M25561 Pain in right knee: Secondary | ICD-10-CM | POA: Diagnosis not present

## 2020-08-21 DIAGNOSIS — M5459 Other low back pain: Secondary | ICD-10-CM | POA: Diagnosis not present

## 2020-08-21 DIAGNOSIS — L814 Other melanin hyperpigmentation: Secondary | ICD-10-CM | POA: Diagnosis not present

## 2020-08-21 DIAGNOSIS — D225 Melanocytic nevi of trunk: Secondary | ICD-10-CM | POA: Diagnosis not present

## 2020-08-21 DIAGNOSIS — R2681 Unsteadiness on feet: Secondary | ICD-10-CM | POA: Diagnosis not present

## 2020-08-21 DIAGNOSIS — M25562 Pain in left knee: Secondary | ICD-10-CM | POA: Diagnosis not present

## 2020-08-21 DIAGNOSIS — L304 Erythema intertrigo: Secondary | ICD-10-CM | POA: Diagnosis not present

## 2020-08-21 DIAGNOSIS — D1801 Hemangioma of skin and subcutaneous tissue: Secondary | ICD-10-CM | POA: Diagnosis not present

## 2020-08-23 DIAGNOSIS — Z20828 Contact with and (suspected) exposure to other viral communicable diseases: Secondary | ICD-10-CM | POA: Diagnosis not present

## 2020-08-24 DIAGNOSIS — R2681 Unsteadiness on feet: Secondary | ICD-10-CM | POA: Diagnosis not present

## 2020-08-24 DIAGNOSIS — M25561 Pain in right knee: Secondary | ICD-10-CM | POA: Diagnosis not present

## 2020-08-24 DIAGNOSIS — M25562 Pain in left knee: Secondary | ICD-10-CM | POA: Diagnosis not present

## 2020-08-24 DIAGNOSIS — M5459 Other low back pain: Secondary | ICD-10-CM | POA: Diagnosis not present

## 2020-08-30 DIAGNOSIS — Z20828 Contact with and (suspected) exposure to other viral communicable diseases: Secondary | ICD-10-CM | POA: Diagnosis not present

## 2020-08-30 DIAGNOSIS — M25561 Pain in right knee: Secondary | ICD-10-CM | POA: Diagnosis not present

## 2020-08-30 DIAGNOSIS — M5459 Other low back pain: Secondary | ICD-10-CM | POA: Diagnosis not present

## 2020-08-30 DIAGNOSIS — M25562 Pain in left knee: Secondary | ICD-10-CM | POA: Diagnosis not present

## 2020-08-30 DIAGNOSIS — R2681 Unsteadiness on feet: Secondary | ICD-10-CM | POA: Diagnosis not present

## 2020-08-31 DIAGNOSIS — R2681 Unsteadiness on feet: Secondary | ICD-10-CM | POA: Diagnosis not present

## 2020-08-31 DIAGNOSIS — M25562 Pain in left knee: Secondary | ICD-10-CM | POA: Diagnosis not present

## 2020-08-31 DIAGNOSIS — M25561 Pain in right knee: Secondary | ICD-10-CM | POA: Diagnosis not present

## 2020-08-31 DIAGNOSIS — M5459 Other low back pain: Secondary | ICD-10-CM | POA: Diagnosis not present

## 2020-09-05 DIAGNOSIS — M25561 Pain in right knee: Secondary | ICD-10-CM | POA: Diagnosis not present

## 2020-09-05 DIAGNOSIS — M25562 Pain in left knee: Secondary | ICD-10-CM | POA: Diagnosis not present

## 2020-09-05 DIAGNOSIS — M5459 Other low back pain: Secondary | ICD-10-CM | POA: Diagnosis not present

## 2020-09-05 DIAGNOSIS — R2681 Unsteadiness on feet: Secondary | ICD-10-CM | POA: Diagnosis not present

## 2020-09-06 ENCOUNTER — Encounter: Payer: PPO | Admitting: Gastroenterology

## 2020-09-06 DIAGNOSIS — Z20828 Contact with and (suspected) exposure to other viral communicable diseases: Secondary | ICD-10-CM | POA: Diagnosis not present

## 2020-09-13 DIAGNOSIS — Z20828 Contact with and (suspected) exposure to other viral communicable diseases: Secondary | ICD-10-CM | POA: Diagnosis not present

## 2020-09-19 DIAGNOSIS — M5459 Other low back pain: Secondary | ICD-10-CM | POA: Diagnosis not present

## 2020-09-19 DIAGNOSIS — R2681 Unsteadiness on feet: Secondary | ICD-10-CM | POA: Diagnosis not present

## 2020-09-19 DIAGNOSIS — M25561 Pain in right knee: Secondary | ICD-10-CM | POA: Diagnosis not present

## 2020-09-19 DIAGNOSIS — M25562 Pain in left knee: Secondary | ICD-10-CM | POA: Diagnosis not present

## 2020-09-20 DIAGNOSIS — Z20828 Contact with and (suspected) exposure to other viral communicable diseases: Secondary | ICD-10-CM | POA: Diagnosis not present

## 2020-09-26 DIAGNOSIS — R2681 Unsteadiness on feet: Secondary | ICD-10-CM | POA: Diagnosis not present

## 2020-09-26 DIAGNOSIS — M5459 Other low back pain: Secondary | ICD-10-CM | POA: Diagnosis not present

## 2020-09-26 DIAGNOSIS — M25561 Pain in right knee: Secondary | ICD-10-CM | POA: Diagnosis not present

## 2020-09-26 DIAGNOSIS — M25562 Pain in left knee: Secondary | ICD-10-CM | POA: Diagnosis not present

## 2020-09-27 DIAGNOSIS — Z20828 Contact with and (suspected) exposure to other viral communicable diseases: Secondary | ICD-10-CM | POA: Diagnosis not present

## 2020-09-28 DIAGNOSIS — M25562 Pain in left knee: Secondary | ICD-10-CM | POA: Diagnosis not present

## 2020-09-28 DIAGNOSIS — R2681 Unsteadiness on feet: Secondary | ICD-10-CM | POA: Diagnosis not present

## 2020-09-28 DIAGNOSIS — M5459 Other low back pain: Secondary | ICD-10-CM | POA: Diagnosis not present

## 2020-09-28 DIAGNOSIS — M25561 Pain in right knee: Secondary | ICD-10-CM | POA: Diagnosis not present

## 2020-10-10 DIAGNOSIS — M5459 Other low back pain: Secondary | ICD-10-CM | POA: Diagnosis not present

## 2020-10-10 DIAGNOSIS — R2681 Unsteadiness on feet: Secondary | ICD-10-CM | POA: Diagnosis not present

## 2020-10-10 DIAGNOSIS — M25562 Pain in left knee: Secondary | ICD-10-CM | POA: Diagnosis not present

## 2020-10-10 DIAGNOSIS — M25561 Pain in right knee: Secondary | ICD-10-CM | POA: Diagnosis not present

## 2020-10-11 DIAGNOSIS — Z8616 Personal history of COVID-19: Secondary | ICD-10-CM | POA: Diagnosis not present

## 2020-10-12 ENCOUNTER — Telehealth: Payer: Self-pay

## 2020-10-12 NOTE — Telephone Encounter (Signed)
Pt returned call and I answered questions regarding her getting the covid booster and the flu shot.

## 2020-10-12 NOTE — Telephone Encounter (Signed)
Called and lm for pt tcb. 

## 2020-10-12 NOTE — Telephone Encounter (Signed)
Pt called with a few questions regarding the new Covid Vaccine. She would like a call back. Please Advise.

## 2020-10-13 ENCOUNTER — Other Ambulatory Visit: Payer: Self-pay

## 2020-10-13 ENCOUNTER — Telehealth: Payer: Self-pay

## 2020-10-13 DIAGNOSIS — D126 Benign neoplasm of colon, unspecified: Secondary | ICD-10-CM

## 2020-10-13 NOTE — Telephone Encounter (Signed)
Called patient to schedule Colonoscopy at the hospital patient scheduled for 02/01/21.

## 2020-10-17 DIAGNOSIS — M5459 Other low back pain: Secondary | ICD-10-CM | POA: Diagnosis not present

## 2020-10-17 DIAGNOSIS — M25562 Pain in left knee: Secondary | ICD-10-CM | POA: Diagnosis not present

## 2020-10-17 DIAGNOSIS — M25561 Pain in right knee: Secondary | ICD-10-CM | POA: Diagnosis not present

## 2020-10-17 DIAGNOSIS — R2681 Unsteadiness on feet: Secondary | ICD-10-CM | POA: Diagnosis not present

## 2020-10-18 DIAGNOSIS — Z20828 Contact with and (suspected) exposure to other viral communicable diseases: Secondary | ICD-10-CM | POA: Diagnosis not present

## 2020-10-19 ENCOUNTER — Ambulatory Visit (INDEPENDENT_AMBULATORY_CARE_PROVIDER_SITE_OTHER): Payer: PPO

## 2020-10-19 ENCOUNTER — Other Ambulatory Visit: Payer: Self-pay

## 2020-10-19 DIAGNOSIS — Z23 Encounter for immunization: Secondary | ICD-10-CM

## 2020-10-20 ENCOUNTER — Ambulatory Visit: Payer: PPO | Admitting: Family Medicine

## 2020-10-20 DIAGNOSIS — I10 Essential (primary) hypertension: Secondary | ICD-10-CM

## 2020-10-20 DIAGNOSIS — M810 Age-related osteoporosis without current pathological fracture: Secondary | ICD-10-CM

## 2020-10-20 DIAGNOSIS — E785 Hyperlipidemia, unspecified: Secondary | ICD-10-CM

## 2020-10-20 DIAGNOSIS — I700 Atherosclerosis of aorta: Secondary | ICD-10-CM

## 2020-10-20 DIAGNOSIS — F411 Generalized anxiety disorder: Secondary | ICD-10-CM

## 2020-10-20 DIAGNOSIS — K219 Gastro-esophageal reflux disease without esophagitis: Secondary | ICD-10-CM

## 2020-10-20 DIAGNOSIS — N183 Chronic kidney disease, stage 3 unspecified: Secondary | ICD-10-CM

## 2020-10-20 DIAGNOSIS — E210 Primary hyperparathyroidism: Secondary | ICD-10-CM

## 2020-10-22 ENCOUNTER — Other Ambulatory Visit: Payer: Self-pay | Admitting: Family Medicine

## 2020-10-25 ENCOUNTER — Encounter: Payer: Self-pay | Admitting: Family Medicine

## 2020-10-25 DIAGNOSIS — M25562 Pain in left knee: Secondary | ICD-10-CM | POA: Diagnosis not present

## 2020-10-25 DIAGNOSIS — Z20828 Contact with and (suspected) exposure to other viral communicable diseases: Secondary | ICD-10-CM | POA: Diagnosis not present

## 2020-10-25 DIAGNOSIS — R2681 Unsteadiness on feet: Secondary | ICD-10-CM | POA: Diagnosis not present

## 2020-10-25 DIAGNOSIS — M25561 Pain in right knee: Secondary | ICD-10-CM | POA: Diagnosis not present

## 2020-10-25 DIAGNOSIS — M5459 Other low back pain: Secondary | ICD-10-CM | POA: Diagnosis not present

## 2020-10-30 ENCOUNTER — Other Ambulatory Visit: Payer: Self-pay

## 2020-10-30 ENCOUNTER — Ambulatory Visit (INDEPENDENT_AMBULATORY_CARE_PROVIDER_SITE_OTHER): Payer: PPO

## 2020-10-30 DIAGNOSIS — M5459 Other low back pain: Secondary | ICD-10-CM | POA: Diagnosis not present

## 2020-10-30 DIAGNOSIS — Z0000 Encounter for general adult medical examination without abnormal findings: Secondary | ICD-10-CM

## 2020-10-30 DIAGNOSIS — M25562 Pain in left knee: Secondary | ICD-10-CM | POA: Diagnosis not present

## 2020-10-30 DIAGNOSIS — M25561 Pain in right knee: Secondary | ICD-10-CM | POA: Diagnosis not present

## 2020-10-30 DIAGNOSIS — R2681 Unsteadiness on feet: Secondary | ICD-10-CM | POA: Diagnosis not present

## 2020-10-30 NOTE — Patient Instructions (Signed)
Ms. Schmall , Thank you for taking time to come for your Medicare Wellness Visit. I appreciate your ongoing commitment to your health goals. Please review the following plan we discussed and let me know if I can assist you in the future.   Screening recommendations/referrals: Colonoscopy: pt stated schedule 01/2021  Mammogram: Done 07/13/20 repeat every year  Bone Density: Done 03/17/20 Recommended yearly ophthalmology/optometry visit for glaucoma screening and checkup Recommended yearly dental visit for hygiene and checkup  Vaccinations: Influenza vaccine: Done 10/19/20  Pneumococcal vaccine: Up to date Tdap vaccine: Done 10/18/16 repeat every 10 years  Shingles vaccine: Done 1st dose 10/26/20   Covid-19:Completed 1/21, 2/11, 12/16/19   Advanced directives: Copies in chart   Conditions/risks identified: Stay Healthy   Next appointment: Follow up in one year for your annual wellness visit    Preventive Care 25 Years and Older, Female Preventive care refers to lifestyle choices and visits with your health care provider that can promote health and wellness. What does preventive care include? A yearly physical exam. This is also called an annual well check. Dental exams once or twice a year. Routine eye exams. Ask your health care provider how often you should have your eyes checked. Personal lifestyle choices, including: Daily care of your teeth and gums. Regular physical activity. Eating a healthy diet. Avoiding tobacco and drug use. Limiting alcohol use. Practicing safe sex. Taking low-dose aspirin every day. Taking vitamin and mineral supplements as recommended by your health care provider. What happens during an annual well check? The services and screenings done by your health care provider during your annual well check will depend on your age, overall health, lifestyle risk factors, and family history of disease. Counseling  Your health care provider may ask you questions about  your: Alcohol use. Tobacco use. Drug use. Emotional well-being. Home and relationship well-being. Sexual activity. Eating habits. History of falls. Memory and ability to understand (cognition). Work and work Statistician. Reproductive health. Screening  You may have the following tests or measurements: Height, weight, and BMI. Blood pressure. Lipid and cholesterol levels. These may be checked every 5 years, or more frequently if you are over 3 years old. Skin check. Lung cancer screening. You may have this screening every year starting at age 31 if you have a 30-pack-year history of smoking and currently smoke or have quit within the past 15 years. Fecal occult blood test (FOBT) of the stool. You may have this test every year starting at age 64. Flexible sigmoidoscopy or colonoscopy. You may have a sigmoidoscopy every 5 years or a colonoscopy every 10 years starting at age 54. Hepatitis C blood test. Hepatitis B blood test. Sexually transmitted disease (STD) testing. Diabetes screening. This is done by checking your blood sugar (glucose) after you have not eaten for a while (fasting). You may have this done every 1-3 years. Bone density scan. This is done to screen for osteoporosis. You may have this done starting at age 45. Mammogram. This may be done every 1-2 years. Talk to your health care provider about how often you should have regular mammograms. Talk with your health care provider about your test results, treatment options, and if necessary, the need for more tests. Vaccines  Your health care provider may recommend certain vaccines, such as: Influenza vaccine. This is recommended every year. Tetanus, diphtheria, and acellular pertussis (Tdap, Td) vaccine. You may need a Td booster every 10 years. Zoster vaccine. You may need this after age 56. Pneumococcal 13-valent conjugate (  PCV13) vaccine. One dose is recommended after age 5. Pneumococcal polysaccharide (PPSV23) vaccine.  One dose is recommended after age 8. Talk to your health care provider about which screenings and vaccines you need and how often you need them. This information is not intended to replace advice given to you by your health care provider. Make sure you discuss any questions you have with your health care provider. Document Released: 01/20/2015 Document Revised: 09/13/2015 Document Reviewed: 10/25/2014 Elsevier Interactive Patient Education  2017 [employer] Prevention in the Home Falls can cause injuries. They can happen to people of all ages. There are many things you can do to make your home safe and to help prevent falls. What can I do on the outside of my home? Regularly fix the edges of walkways and driveways and fix any cracks. Remove anything that might make you trip as you walk through a door, such as a raised step or threshold. Trim any bushes or trees on the path to your home. Use bright outdoor lighting. Clear any walking paths of anything that might make someone trip, such as rocks or tools. Regularly check to see if handrails are loose or broken. Make sure that both sides of any steps have handrails. Any raised decks and porches should have guardrails on the edges. Have any leaves, snow, or ice cleared regularly. Use sand or salt on walking paths during winter. Clean up any spills in your garage right away. This includes oil or grease spills. What can I do in the bathroom? Use night lights. Install grab bars by the toilet and in the tub and shower. Do not use towel bars as grab bars. Use non-skid mats or decals in the tub or shower. If you need to sit down in the shower, use a plastic, non-slip stool. Keep the floor dry. Clean up any water that spills on the floor as soon as it happens. Remove soap buildup in the tub or shower regularly. Attach bath mats securely with double-sided non-slip rug tape. Do not have throw rugs and other things on the floor that can make  you trip. What can I do in the bedroom? Use night lights. Make sure that you have a light by your bed that is easy to reach. Do not use any sheets or blankets that are too big for your bed. They should not hang down onto the floor. Have a firm chair that has side arms. You can use this for support while you get dressed. Do not have throw rugs and other things on the floor that can make you trip. What can I do in the kitchen? Clean up any spills right away. Avoid walking on wet floors. Keep items that you use a lot in easy-to-reach places. If you need to reach something above you, use a strong step stool that has a grab bar. Keep electrical cords out of the way. Do not use floor polish or wax that makes floors slippery. If you must use wax, use non-skid floor wax. Do not have throw rugs and other things on the floor that can make you trip. What can I do with my stairs? Do not leave any items on the stairs. Make sure that there are handrails on both sides of the stairs and use them. Fix handrails that are broken or loose. Make sure that handrails are as long as the stairways. Check any carpeting to make sure that it is firmly attached to the stairs. Fix any carpet that is  loose or worn. Avoid having throw rugs at the top or bottom of the stairs. If you do have throw rugs, attach them to the floor with carpet tape. Make sure that you have a light switch at the top of the stairs and the bottom of the stairs. If you do not have them, ask someone to add them for you. What else can I do to help prevent falls? Wear shoes that: Do not have high heels. Have rubber bottoms. Are comfortable and fit you well. Are closed at the toe. Do not wear sandals. If you use a stepladder: Make sure that it is fully opened. Do not climb a closed stepladder. Make sure that both sides of the stepladder are locked into place. Ask someone to hold it for you, if possible. Clearly mark and make sure that you can  see: Any grab bars or handrails. First and last steps. Where the edge of each step is. Use tools that help you move around (mobility aids) if they are needed. These include: Canes. Walkers. Scooters. Crutches. Turn on the lights when you go into a dark area. Replace any light bulbs as soon as they burn out. Set up your furniture so you have a clear path. Avoid moving your furniture around. If any of your floors are uneven, fix them. If there are any pets around you, be aware of where they are. Review your medicines with your doctor. Some medicines can make you feel dizzy. This can increase your chance of falling. Ask your doctor what other things that you can do to help prevent falls. This information is not intended to replace advice given to you by your health care provider. Make sure you discuss any questions you have with your health care provider. Document Released: 10/20/2008 Document Revised: 06/01/2015 Document Reviewed: 01/28/2014 Elsevier Interactive Patient Education  2017 [employer].

## 2020-10-30 NOTE — Progress Notes (Addendum)
Virtual Visit via Telephone Note  I connected with  Crystal Carroll on 10/30/20 at  3:15 PM EDT by telephone and verified that I am speaking with the correct person using two identifiers.  Medicare Annual Wellness visit completed telephonically due to Covid-19 pandemic.   Persons participating in this call: This Health Coach and this patient.   Location: Patient: home Provider: office   I discussed the limitations, risks, security and privacy concerns of performing an evaluation and management service by telephone and the availability of in person appointments. The patient expressed understanding and agreed to proceed.  Unable to perform video visit due to video visit attempted and failed and/or patient does not have video capability.   Some vital signs may be absent or patient reported.   Crystal Brace, LPN   Subjective:   Crystal Carroll is a 75 y.o. female who presents for Medicare Annual (Subsequent) preventive examination.  Review of Systems     Cardiac Risk Factors include: advanced age (>9mn, >>109women);hypertension;dyslipidemia     Objective:    There were no vitals filed for this visit. There is no height or weight on file to calculate BMI.  Advanced Directives 10/30/2020 10/29/2019 01/13/2019 01/13/2019 01/06/2019 09/28/2018 07/28/2018  Does Patient Have a Medical Advance Directive? Yes Yes Yes Yes Yes Yes Yes  Type of AParamedicof [place] will Living will [place] will [place] will [place]Living will [place]Living will  Does patient want to make changes to medical advance directive? - - - No - Patient declined No - Patient declined No - Patient declined No - Patient declined  Copy of [place] Chart? Yes - validated most recent copy scanned in chart (See row information) Yes - validated  most recent copy scanned in chart (See row information) - No - copy requested No - copy requested Yes - validated most recent copy scanned in chart (See row information) No - copy requested  Would patient like information on creating a medical advance directive? - - No - Patient declined No - Patient declined - - -    Current Medications (verified) Outpatient Encounter Medications as of 10/30/2020  Medication Sig   aspirin EC 81 MG tablet Take 81 mg by mouth at bedtime.    buPROPion (WELLBUTRIN XL) 150 MG 24 hr tablet Take 1 tablet (150 mg total) by mouth daily.   busPIRone (BUSPAR) 5 MG tablet Take 1 tablet (5 mg total) by mouth 2 (two) times daily as needed (anxiety).   Cholecalciferol (VITAMIN D3) 50 MCG (2000 UT) TABS Take 2,000 Units by mouth daily.   citalopram (CELEXA) 20 MG tablet TAKE ONE TABLET BY MOUTH ONCE DAILY.   diclofenac sodium (VOLTAREN) 1 % GEL Apply 2 g topically 4 (four) times daily. (Patient taking differently: Apply 2 g topically at bedtime as needed (pain).)   pantoprazole (PROTONIX) 20 MG tablet TAKE ONE TABLET BY MOUTH DAILY. (Patient taking differently: Take 20 mg by mouth daily.)   rosuvastatin (CRESTOR) 40 MG tablet TAKE ONE TABLET BY MOUTH DAILY.   vitamin B-12 (CYANOCOBALAMIN) 1000 MCG tablet Take 1,000 mcg by mouth daily.   [DISCONTINUED] Na Sulfate-K Sulfate-Mg Sulf 17.5-3.13-1.6 GM/177ML SOLN Take 1 kit by mouth as directed. Generic suprep-take as directed (Patient not taking: Reported on 10/30/2020)   No facility-administered encounter medications on file as of 10/30/2020.    Allergies (verified) Codeine and  Sulfamethoxazole   History: Past Medical History:  Diagnosis Date   Anxiety    Aortic atherosclerosis ([address])    Aortic valvar stenosis 11/2018   Mild, noted on ECHO   Arthritis    cervical neck   CKD (chronic kidney disease), stage III (HCC)    Stable   GERD (gastroesophageal reflux disease)    History of TIA (transient ischemic attack)     per patient, didn't know about it   Hyperlipidemia    Hyperparathyroidism ([place])    Hypertension    History of    Junctional rhythm    Osteoporosis    Palpitations    Thyroid nodule    2.0 cm solid nodule in the inferior left thyroid lobe    Past Surgical History:  Procedure Laterality Date   CERVICAL LAMINECTOMY  2004   Earle Gell   COLONOSCOPY  05/31/2014   Deatra Ina   ESOPHAGOGASTRODUODENOSCOPY     PARATHYROIDECTOMY Left 01/13/2019   Procedure: LEFT PARATHYROIDECTOMY;  Surgeon: Armandina Gemma, MD;  Location: [facility];  Service: General;  Laterality: Left;   THYROID LOBECTOMY Left 01/13/2019   Procedure: LEFT THYROID LOBECTOMY;  Surgeon: Armandina Gemma, MD;  Location: [facility];  Service: General;  Laterality: Left;   Family History  Problem Relation Age of Onset   Melanoma Mother 41       labia   Heart attack Father 35       smoker, rheumatic fever as child   Colon cancer Neg Hx    Colon polyps Neg Hx    Esophageal cancer Neg Hx    Rectal cancer Neg Hx    Stomach cancer Neg Hx    Social History   Socioeconomic History   Marital status: Married    Spouse name: Brad   Number of children: 1   Years of education: 14   Highest education level: Associate degree: academic program  Occupational History   Occupation: Retired  Tobacco Use   Smoking status: Former    Packs/day: 0.75    Years: 50.00    Pack years: 37.50    Types: Cigarettes    Quit date: 03/07/2013    Years since quitting: 7.6   Smokeless tobacco: Never  Vaping Use   Vaping Use: Never used  Substance and Sexual Activity   Alcohol use: Yes    Comment: 1-2 daily spritzers per pt   Drug use: No   Sexual activity: Not on file  Other Topics Concern   Not on file  Social History Narrative   Married to [employer] (patient) in 1981. 2nd marriage-1 child with 1 adopted grandchild.       Retired as Statistician.       Hobbies: antiques, former Firefighter      Exercise: none currently.        Patient is right-handed. She lives with her husband in a two level home. She does not exercise.      Social Determinants of Health   Financial Resource Strain: Low Risk    Difficulty of Paying Living Expenses: Not hard at all  Food Insecurity: No Food Insecurity   Worried About Charity fundraiser in the Last Year: Never true   [place] in the Last Year: Never true  Transportation Needs: No Transportation Needs   Lack of Transportation (Medical): No   Lack of Transportation (Non-Medical): No  Physical Activity: Insufficiently Active   Days of Exercise per Week: 2 days   Minutes of  Exercise per Session: 60 min  Stress: No Stress Concern Present   Feeling of Stress : Not at all  Social Connections: Moderately Integrated   Frequency of Communication with Friends and Family: More than three times a week   Frequency of Social Gatherings with Friends and Family: More than three times a week   Attends Religious Services: Never   Marine scientist or Organizations: Yes   Attends Music therapist: 1 to 4 times per year   Marital Status: Married    Tobacco Counseling Counseling given: Not Answered   Clinical Intake:  Pre-visit preparation completed: Yes  Pain : No/denies pain     BMI - recorded: 27.72 Nutritional Status: BMI 25 -29 Overweight Nutritional Risks: None Diabetes: No  How often do you need to have someone help you when you read instructions, pamphlets, or other written materials from your doctor or pharmacy?: 1 - Never  Diabetic?no  Interpreter Needed?: No  Information entered by :: Charlott Rakes LPN   Activities of Daily Living In your present state of health, do you have any difficulty performing the following activities: 10/30/2020  Hearing? N  Vision? N  Difficulty concentrating or making decisions? N  Walking or climbing stairs? N  Dressing or bathing? N  Doing errands, shopping? N  Preparing Food and eating ? N  Using the  Toilet? N  In the past six months, have you accidently leaked urine? N  Do you have problems with loss of bowel control? N  Managing your Medications? N  Managing your Finances? N  Housekeeping or managing your Housekeeping? N  Some recent data might be hidden    Patient Care Team: Marin Olp, MD as PCP - General (Family Medicine) Pieter Partridge, DO as Consulting Physician (Neurology) Juluis Rainier as Consulting Physician (Optometry) Shamleffer, Melanie Crazier, MD as Consulting Physician (Endocrinology) Armandina Gemma, MD as Consulting Physician (General Surgery) Madelin Rear, [facility] as Pharmacist (Pharmacist)  Indicate any recent Medical Services you may have received from other than [facility] providers in the past year (date may be approximate).     Assessment:   This is a routine wellness examination for [place].  Hearing/Vision screen Hearing Screening - Comments:: Pt denies any hearing issues  Vision Screening - Comments:: Pt follows up with Dr Alois Cliche for annual eye exams   Dietary issues and exercise activities discussed: Current Exercise Habits: Home exercise routine, Type of exercise: Other - see comments (PT), Time (Minutes): 60   Goals Addressed             This Visit's Progress    Patient Stated       Stay alive        Depression Screen PHQ 2/9 Scores 10/30/2020 06/09/2020 01/26/2020 10/29/2019 07/19/2019 04/15/2019 10/29/2018  PHQ - 2 Score 0 0 0 0 0 0 0  PHQ- 9 Score - 0 - - 0 0 3    Fall Risk Fall Risk  10/30/2020 06/09/2020 10/29/2019 07/19/2019 04/15/2019  Falls in the past year? 0 0 0 0 0  Number falls in past yr: 0 0 0 0 0  Injury with Fall? 0 0 0 0 0  Risk for fall due to : Impaired vision;Impaired balance/gait - Impaired vision;Impaired balance/gait - -  Follow up Falls prevention discussed - Falls prevention discussed - -    FALL RISK PREVENTION PERTAINING TO THE HOME:  Any stairs in or around the home? No  If so, are there any without  handrails? No  Home free of loose throw rugs in walkways, pet beds, electrical cords, etc? Yes  Adequate lighting in your home to reduce risk of falls? Yes   ASSISTIVE DEVICES UTILIZED TO PREVENT FALLS:  Life alert? Pull cord  Use of a cane, walker or w/c? Yes  Grab bars in the bathroom? Yes  Shower chair or bench in shower? Yes  Elevated toilet seat or a handicapped toilet? Yes   TIMED UP AND GO:  Was the test performed? No .   Cognitive Function:     6CIT Screen 10/30/2020 10/29/2019 09/28/2018  What Year? 0 points 0 points 0 points  What month? 0 points 0 points 0 points  What time? 0 points - 0 points  Count back from 20 0 points 0 points 0 points  Months in reverse 0 points 0 points 0 points  Repeat phrase 0 points 0 points 0 points  Total Score 0 - 0    Immunizations Immunization History  Administered Date(s) Administered   Fluad Quad(high Dose 65+) 09/28/2018, 01/18/2020, 10/19/2020   Influenza Split 12/03/2010, 12/09/2011   Influenza Whole 10/18/2009   Influenza, High Dose Seasonal PF 11/02/2012, 10/18/2015, 10/21/2016, 10/21/2017   Influenza,inj,Quad PF,6+ Mos 10/15/2013, 12/06/2014   Influenza-Unspecified 10/08/2015   PFIZER(Purple Top)SARS-COV-2 Vaccination 01/28/2019, 02/18/2019, 12/16/2019   Pneumococcal Conjugate-13 04/15/2014   Pneumococcal Polysaccharide-23 12/03/2010   Td 01/07/1998, 10/18/2009   Tdap 10/18/2016   Zoster Recombinat (Shingrix) 11/04/2018, 10/26/2020   Zoster, Live 10/18/2009    TDAP status: Up to date  Flu Vaccine status: Up to date  Pneumococcal vaccine status: Up to date  Covid-19 vaccine status: Completed vaccines  Qualifies for Shingles Vaccine? Yes   Zostavax completed Yes   Shingrix Completed?: Yes  Screening Tests Health Maintenance  Topic Date Due   COLONOSCOPY (Pts 45-5yr Insurance coverage will need to be confirmed)  05/31/2019   COVID-19 Vaccine (4 - Booster for Pfizer series) 02/10/2020   TETANUS/TDAP   10/19/2026   Pneumonia Vaccine 76 Years old  Completed   INFLUENZA VACCINE  Completed   DEXA SCAN  Completed   Hepatitis C Screening  Completed   Zoster Vaccines- Shingrix  Completed   HPV VACCINES  Aged Out    Health Maintenance  Health Maintenance Due  Topic Date Due   COLONOSCOPY (Pts 45-434yrInsurance coverage will need to be confirmed)  05/31/2019   COVID-19 Vaccine (4 - Booster for [place]) 02/10/2020    Colorectal cancer screening: Referral to GI placed Pt scheduled for 01/2021 per pt . Pt aware the office will call re: appt.  Mammogram status: Completed 07/13/2020. Repeat every year  Bone Density status: Completed 03/17/20. Results reflect: Bone density results: OSTEOPOROSIS. Repeat every 2 years.   Additional Screening:  Hepatitis C Screening:  Completed 12/07/14  Vision Screening: Recommended annual ophthalmology exams for early detection of glaucoma and other disorders of the eye. Is the patient up to date with their annual eye exam?  Yes  Who is the provider or what is the name of the office in which the patient attends annual eye exams? Pt PeAlois ClicheIf pt is not established with a provider, would they like to be referred to a provider to establish care? No .   Dental Screening: Recommended annual dental exams for proper oral hygiene  Community Resource Referral / Chronic Care Management: CRR required this visit?  No   CCM required this visit?  No      Plan:  I have personally reviewed and noted the following in the patient's chart:   Medical and social history Use of alcohol, tobacco or illicit drugs  Current medications and supplements including opioid prescriptions.  Functional ability and status Nutritional status Physical activity Advanced directives List of other physicians Hospitalizations, surgeries, and ER visits in previous 12 months Vitals Screenings to include cognitive, depression, and falls Referrals and appointments  In  addition, I have reviewed and discussed with patient certain preventive protocols, quality metrics, and best practice recommendations. A written personalized care plan for preventive services as well as general preventive health recommendations were provided to patient.     Crystal Brace, LPN   [id number]   Nurse Notes: None

## 2020-10-31 ENCOUNTER — Encounter: Payer: Self-pay | Admitting: Family Medicine

## 2020-10-31 ENCOUNTER — Ambulatory Visit (INDEPENDENT_AMBULATORY_CARE_PROVIDER_SITE_OTHER): Payer: PPO | Admitting: Family Medicine

## 2020-10-31 VITALS — BP 116/60 | HR 63 | Temp 97.3°F | Ht 62.5 in | Wt 153.4 lb

## 2020-10-31 DIAGNOSIS — I10 Essential (primary) hypertension: Secondary | ICD-10-CM | POA: Diagnosis not present

## 2020-10-31 DIAGNOSIS — F411 Generalized anxiety disorder: Secondary | ICD-10-CM | POA: Diagnosis not present

## 2020-10-31 DIAGNOSIS — N183 Chronic kidney disease, stage 3 unspecified: Secondary | ICD-10-CM

## 2020-10-31 DIAGNOSIS — E785 Hyperlipidemia, unspecified: Secondary | ICD-10-CM

## 2020-10-31 NOTE — Patient Instructions (Addendum)
Health Maintenance Due  Topic Date Due   COLONOSCOPY - scheduled for january  05/31/2019   COVID-19 Vaccine (4 - Booster for [employer] series) - Recommend getting Omicron/Bivalent booster only at your local pharmacy! Please let us know when you have received this vaccination.  02/10/2020   I am glad to see your blood pressure has significantly improved!  Recommended follow up: Return in about 1 month (around 12/01/2020) for a follow-up or sooner if needed.

## 2020-10-31 NOTE — Progress Notes (Signed)
Phone [phone] In person visit   Subjective:   Crystal Carroll is a 75 y.o. year old very pleasant female patient who presents for/with See problem oriented charting Chief Complaint  Patient presents with   Covid Vaccine     Pt would like to discuss getting the 2nd Covid booster.  vaccine.    Immunizations    She would also like to discuss the pneumonia vaccine.     This visit occurred during the SARS-CoV-2 public health emergency.  Safety protocols were in place, including screening questions prior to the visit, additional usage of staff PPE, and extensive cleaning of exam room while observing appropriate contact time as indicated for disinfecting solutions.   Past Medical History-  Patient Active Problem List   Diagnosis Date Noted   Mild aortic stenosis 07/19/2019    Priority: 2.   Osteoporosis 03/13/2018    Priority: 2.   DDD (degenerative disc disease), lumbar 03/05/2018    Priority: 2.   Aortic atherosclerosis ([place]) 06/12/2017    Priority: 2.   Palpitations 05/12/2017    Priority: 2.   Vitamin B12 deficiency 05/12/2017    Priority: 2.   Cervical radiculopathy 04/03/2016    Priority: 2.   Essential tremor 04/18/2015    Priority: 2.   Heavy alcohol use 04/14/2014    Priority: 2.   CKD (chronic kidney disease), stage III ([place]) 10/15/2013    Priority: 2.   Former smoker 12/24/2006    Priority: 2.   Hyperlipidemia 05/26/2006    Priority: 2.   GAD (generalized anxiety disorder) 05/26/2006    Priority: 2.   Essential hypertension 05/26/2006    Priority: 2.   Patellofemoral arthritis 03/05/2018    Priority: 3.   Unstable gait 01/20/2018    Priority: 3.   Adenomatous colon polyp 06/14/2014    Priority: 3.   H/O cold sores 10/15/2013    Priority: 3.   GERD 05/26/2006    Priority: 3.   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      buPROPion (WELLBUTRIN XL) 150 MG 24 hr tablet Take 1 tablet (150 mg total) by mouth daily. 30 tablet 5   busPIRone (BUSPAR) 5 MG tablet Take 1 tablet (5 mg total) by mouth 2 (two) times daily as needed (anxiety). 60 tablet 5   Cholecalciferol (VITAMIN D3) 50 MCG (2000 UT) TABS Take 2,000 Units by mouth daily.     citalopram (CELEXA) 20 MG tablet TAKE ONE TABLET BY MOUTH ONCE DAILY. 90 tablet 0   diclofenac sodium (VOLTAREN) 1 % GEL Apply 2 g topically 4 (four) times daily. (Patient taking differently: Apply 2 g topically at bedtime as needed (pain).) 100 g 3   pantoprazole (PROTONIX) 20 MG tablet TAKE ONE TABLET BY MOUTH DAILY. (Patient taking differently: Take 20 mg by mouth daily.) 90 tablet 1   rosuvastatin (CRESTOR) 40 MG tablet TAKE ONE TABLET BY MOUTH DAILY. 90 tablet  0   vitamin B-12 (CYANOCOBALAMIN) 1000 MCG tablet Take 1,000 mcg by mouth daily.     No current facility-administered medications for this visit.     Objective:  BP 116/60   Pulse 63   Temp (!) 97.3 F (36.3 C) (Temporal)   Ht 5' 2.5" (1.588 m)   Wt 153 lb 6.4 oz (69.6 kg)   LMP  (LMP Unknown)   SpO2 98%   BMI 27.61 kg/m  Gen: NAD, resting comfortably CV: RRR no murmurs rubs or gallops Lungs: CTAB no crackles, wheeze, rhonchi Abdomen: soft/nontender/nondistended/normal bowel sounds.  Ext: no edema Skin: warm, dry Neuro: walks with cane    Assessment and Plan   #Hyperlipidemia/aortic atherosclerosis/ history of stroke- on aspirin 81 mg with LDL goal under 70 S:  Compliant with rosuvastatin 40 mg.   -Aortic atherosclerosis noted CT 06/26/2017 for lung cancer screening.  Risk factor modification with blood pressure and lipid control plan. Lab Results  Component Value Date   CHOL 176 07/20/2020   HDL 62.70 07/20/2020   LDLCALC 94 07/20/2020   LDLDIRECT 106 (H) 07/19/2016   TRIG 97.0 07/20/2020   CHOLHDL 3 07/20/2020  A/P:#s slightly high last visit- was missing some doses- doing better now- can repeat next labs   #Hypertension/CKD stage III S: Patient remains off lisinopril unfortunately blood pressure remains controlled  -GFR has been stable largely in the 40s lately- in the past had been in the 30s BP Readings from Last 3 Encounters:  10/31/20 116/60  08/11/20 130/78  07/20/20 110/70   A/P: Hypertension is well controlled-continue current medications.  CKD stage III was stable on last labs-continue to avoid NSAIDs   #GERD S: Compliant with Protonix 40 mg-->20mg  and had to add pepcid with dinner in past- now doing ok without that A/P: Controlled. Continue current medications.     #Anxiety/stress management/caregiver burden S: Compliant with Celexa 20 mg daily , Wellbutrin 150mg  XR (reduced from 300mg  due to anxiety concern). Buspirone 5 mg BID has been helpful thankfully- down to once a day now -Prior on xanax but had started to feel oversedated.  - caregiver burden- was not able to get husband in today and she took his appointment -not sleeping well caring for [facility]. Has seen a big change in him.  A/P: doing reasonably well all things considered- phq2 of 0- continue current meds  - she requests 1 month follow up due to level of stress- doing ok now but wants to make sure maintaining  #Knee PT- doing twice a week right now- finds helpful Some balance issues related to this.   # Immunizations - covid bivalent recommended  at the pharmacy -pneumonia shot- not needed as had prevnar 13 and pneumovax 23  #HM - was not able to get colonoscopy-  will have to be rescheduled  Recommended follow up: Return in about 1 month (around 12/01/2020) for a follow-up or sooner if needed. Future Appointments  Date Time Provider [place]  03/12/2021  1:00 PM Shamleffer, Melanie Crazier, MD [facility] None  11/19/2021  3:15 PM [facility] HEALTH COACH [facility]   Lab/Order associations:   ICD-10-CM   1. Essential hypertension  I10     2. Hyperlipidemia, unspecified hyperlipidemia type  E78.5     3. Stage 3 chronic kidney disease, unspecified whether stage 3a or 3b CKD (HCC)  N18.30     4. GAD (generalized anxiety disorder)  F41.1      Return precautions can have hypertension hyperlipidemia advised.  [name],  MD   

## 2020-11-09 DIAGNOSIS — M25562 Pain in left knee: Secondary | ICD-10-CM | POA: Diagnosis not present

## 2020-11-09 DIAGNOSIS — M25561 Pain in right knee: Secondary | ICD-10-CM | POA: Diagnosis not present

## 2020-11-09 DIAGNOSIS — M5459 Other low back pain: Secondary | ICD-10-CM | POA: Diagnosis not present

## 2020-11-09 DIAGNOSIS — R2681 Unsteadiness on feet: Secondary | ICD-10-CM | POA: Diagnosis not present

## 2020-11-13 ENCOUNTER — Other Ambulatory Visit: Payer: Self-pay | Admitting: Family Medicine

## 2020-11-15 DIAGNOSIS — Z20828 Contact with and (suspected) exposure to other viral communicable diseases: Secondary | ICD-10-CM | POA: Diagnosis not present

## 2020-11-17 DIAGNOSIS — M25561 Pain in right knee: Secondary | ICD-10-CM | POA: Diagnosis not present

## 2020-11-17 DIAGNOSIS — M25562 Pain in left knee: Secondary | ICD-10-CM | POA: Diagnosis not present

## 2020-11-17 DIAGNOSIS — M5459 Other low back pain: Secondary | ICD-10-CM | POA: Diagnosis not present

## 2020-11-17 DIAGNOSIS — R2681 Unsteadiness on feet: Secondary | ICD-10-CM | POA: Diagnosis not present

## 2020-11-21 DIAGNOSIS — M25561 Pain in right knee: Secondary | ICD-10-CM | POA: Diagnosis not present

## 2020-11-21 DIAGNOSIS — R2681 Unsteadiness on feet: Secondary | ICD-10-CM | POA: Diagnosis not present

## 2020-11-21 DIAGNOSIS — M5459 Other low back pain: Secondary | ICD-10-CM | POA: Diagnosis not present

## 2020-11-21 DIAGNOSIS — M25562 Pain in left knee: Secondary | ICD-10-CM | POA: Diagnosis not present

## 2020-11-23 ENCOUNTER — Other Ambulatory Visit: Payer: Self-pay | Admitting: Family Medicine

## 2020-11-24 DIAGNOSIS — R2681 Unsteadiness on feet: Secondary | ICD-10-CM | POA: Diagnosis not present

## 2020-11-24 DIAGNOSIS — M25561 Pain in right knee: Secondary | ICD-10-CM | POA: Diagnosis not present

## 2020-11-24 DIAGNOSIS — M5459 Other low back pain: Secondary | ICD-10-CM | POA: Diagnosis not present

## 2020-11-24 DIAGNOSIS — M25562 Pain in left knee: Secondary | ICD-10-CM | POA: Diagnosis not present

## 2020-11-27 NOTE — Progress Notes (Incomplete)
Phone [phone] In person visit   Subjective:   Crystal Carroll is a 75 y.o. year old very pleasant female patient who presents for/with See problem oriented charting No chief complaint on file.   This visit occurred during the SARS-CoV-2 public health emergency.  Safety protocols were in place, including screening questions prior to the visit, additional usage of staff PPE, and extensive cleaning of exam room while observing appropriate contact time as indicated for disinfecting solutions.   Past Medical History-  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 02/24/2019   Hyperparathyroidism, primary ([place]) 07/17/2018   History of lacunar cerebrovascular accident (CVA) 06/18/2018   Osteoporosis 03/13/2018   Patellofemoral arthritis 03/05/2018   DDD (degenerative disc disease), lumbar 03/05/2018   Unstable gait 01/20/2018   Aortic atherosclerosis ([place]) 06/12/2017   Palpitations 05/12/2017   Vitamin B12 deficiency 05/12/2017   Cervical radiculopathy 04/03/2016   Caregiver burden 04/03/2016   Essential tremor 04/18/2015   Adenomatous colon polyp 06/14/2014   Heavy alcohol use 04/14/2014   H/O cold sores 10/15/2013   CKD (chronic kidney disease), stage III ([place]) 10/15/2013   Former smoker 12/24/2006   Hyperlipidemia 05/26/2006   GAD (generalized anxiety disorder) 05/26/2006   Essential hypertension 05/26/2006   GERD 05/26/2006    Medications- reviewed and updated Current Outpatient Medications  Medication Sig Dispense Refill   aspirin EC 81 MG tablet Take 81 mg by mouth at bedtime.      buPROPion (WELLBUTRIN XL) 150 MG 24 hr tablet Take 1 tablet (150 mg total) by mouth daily. 30 tablet 5   busPIRone (BUSPAR) 5 MG tablet Take 1 tablet (5 mg  total) by mouth 2 (two) times daily as needed (anxiety). 60 tablet 5   Cholecalciferol (VITAMIN D3) 50 MCG (2000 UT) TABS Take 2,000 Units by mouth daily.     citalopram (CELEXA) 20 MG tablet TAKE ONE TABLET BY MOUTH ONCE DAILY. 90 tablet 0   diclofenac sodium (VOLTAREN) 1 % GEL Apply 2 g topically 4 (four) times daily. (Patient taking differently: Apply 2 g topically at bedtime as needed (pain).) 100 g 3   pantoprazole (PROTONIX) 20 MG tablet TAKE 1 TABLET ONCE DAILY. 90 tablet 0   rosuvastatin (CRESTOR) 40 MG tablet TAKE ONE TABLET BY MOUTH DAILY. 90 tablet 0   vitamin B-12 (CYANOCOBALAMIN) 1000 MCG tablet Take 1,000 mcg by mouth daily.     No current facility-administered medications for this visit.     Objective:  LMP  (LMP Unknown)  Gen: NAD, resting comfortably CV: RRR no murmurs rubs or gallops Lungs: CTAB no crackles, wheeze, rhonchi Abdomen: soft/nontender/nondistended/normal bowel sounds. No rebound or guarding.  Ext: no edema Skin: warm, dry Neuro: grossly normal, moves all extremities  ***    Assessment and Plan   #Hyperlipidemia/aortic atherosclerosis/ history of stroke- on aspirin 81 mg with LDL goal under 70 S: Compliant with rosuvastatin 40 mg daily  -Aortic atherosclerosis noted CT 06/26/2017 for lung cancer screening. Risk factor modification with blood pressure and lipid control plan. Lab Results  Component Value Date   CHOL 176 07/20/2020   HDL 62.70 07/20/2020   LDLCALC 94 07/20/2020   LDLDIRECT 106 (H) 07/19/2016   TRIG 97.0 07/20/2020   CHOLHDL 3 07/20/2020   A/P: ***  #Hypertension/CKD stage III S: Patient remained  off lisinopril unfortunately blood pressure remained controlled  -GFR has been stable largely in the 40s lately- in the past had been in the 30s Home readings #s: *** BP Readings from Last 3 Encounters:  10/31/20 116/60  08/11/20 130/78  07/20/20 110/70  A/P: ***  #Primary hyperparathyroidism status post parathyroidectomy January  2021 #History of thyroid nodule found to be noninvasive follicular neoplasm of thyroid with papillary-like nuclear features -status post lobectomy of thyroid January 2021 S: Hyperparathyroidism likely led to osteoporosis-see discussion below. Status post parathyroidectomy. Followed with Dr. Kelton Pillar next visit March 2022  -Thyroid nodule removed at same time as parathyroidectomy A/P: ***   #Osteoporosis S: Patient was placed on Fosamax once a week in the past but current plan for treatment was parathyroidectomy in light of hyperparathyroidism/high calcium A/P: ***   #Unstable gait S: Started January 2020. Stat head CT to rule out subdural hematoma. Initially also seem to have some lower extremity weakness and numbness. There was concern for lumbar stenosis. Symptoms improved with time.  Ultimately did MRI of the brain to rule out prior cerebellar stroke-none was detected. Did have prior lacunar stroke and patient was placed back on aspirin.  Patient later was seen Dr. Tomi Likens July 28, 2018-MRI of cervical spine without contrast was ordered. There were some disc bulges and MRI of lumbar spine was also ordered September 2020-this showed spinal stenosis which was thought to be contributing to leg pain and balance issues. Neurology offered referral to neurosurgery orthopedics  A/P: ***   #GERD S: Compliant with Protonix 40 mg-->20mg  daily and had to add pepcid with dinner B12 levels related to PPI use: Lab Results  Component Value Date   VITAMINB12 430 07/20/2020   A/P: ***   #Anxiety/stress management S: Compliant with Celexa 20 mg, Wellbutrin 300 mg extended release. Also takes sparing Xanax as needed for anxiety. -Prior on xanax but had started to feel oversedated.  A/P: ***   Health Maintenance Due  Topic Date Due   COLONOSCOPY (Pts 45-43yrs Insurance coverage will need to be confirmed)  05/31/2019   COVID-19 Vaccine (4 - Booster for [place] series) 02/10/2020   Recommended follow  up: No follow-ups on file. Future Appointments  Date Time Provider [place]  12/04/2020 11:20 AM Marin Olp, MD [facility]  03/12/2021  1:00 PM Shamleffer, Melanie Crazier, MD [facility] None  11/19/2021  3:15 PM [facility] HEALTH COACH [facility]    Lab/Order associations: No diagnosis found.  No orders of the defined types were placed in this encounter.   I,Jada Bradford,acting as a scribe for [name], MD.,have documented all relevant documentation on the behalf of [name], MD,as directed by  [name], MD while in the presence of [name], MD.  *** Return precautions advised.  Burnett Corrente

## 2020-11-29 DIAGNOSIS — Z20828 Contact with and (suspected) exposure to other viral communicable diseases: Secondary | ICD-10-CM | POA: Diagnosis not present

## 2020-12-04 ENCOUNTER — Ambulatory Visit: Payer: PPO | Admitting: Family Medicine

## 2020-12-04 DIAGNOSIS — Z8639 Personal history of other endocrine, nutritional and metabolic disease: Secondary | ICD-10-CM

## 2020-12-04 DIAGNOSIS — I10 Essential (primary) hypertension: Secondary | ICD-10-CM

## 2020-12-04 DIAGNOSIS — Z8673 Personal history of transient ischemic attack (TIA), and cerebral infarction without residual deficits: Secondary | ICD-10-CM

## 2020-12-04 DIAGNOSIS — I700 Atherosclerosis of aorta: Secondary | ICD-10-CM

## 2020-12-04 DIAGNOSIS — R2681 Unsteadiness on feet: Secondary | ICD-10-CM

## 2020-12-04 DIAGNOSIS — E210 Primary hyperparathyroidism: Secondary | ICD-10-CM

## 2020-12-04 DIAGNOSIS — N183 Chronic kidney disease, stage 3 unspecified: Secondary | ICD-10-CM

## 2020-12-04 DIAGNOSIS — E785 Hyperlipidemia, unspecified: Secondary | ICD-10-CM

## 2020-12-05 DIAGNOSIS — M25562 Pain in left knee: Secondary | ICD-10-CM | POA: Diagnosis not present

## 2020-12-05 DIAGNOSIS — R2681 Unsteadiness on feet: Secondary | ICD-10-CM | POA: Diagnosis not present

## 2020-12-05 DIAGNOSIS — M5459 Other low back pain: Secondary | ICD-10-CM | POA: Diagnosis not present

## 2020-12-05 DIAGNOSIS — M25561 Pain in right knee: Secondary | ICD-10-CM | POA: Diagnosis not present

## 2020-12-11 NOTE — Progress Notes (Signed)
Phone [phone] Virtual visit via Video note   Subjective:  Chief complaint: Chief Complaint  Patient presents with   Follow-up    Pt states she called EMS last night with 100.3 fever and was told by EMS that she had strep throat without being swabbed.   Sore Throat   COVID positive    Pt states she tested positive for COVID on 12/16/20, she has only been able to have water and coke since Saturday.    This visit type was conducted due to national recommendations for restrictions regarding the COVID-19 Pandemic (e.g. social distancing).  This format is felt to be most appropriate for this patient at this time balancing risks to patient and risks to population by having him in for in person visit.  No physical exam was performed (except for noted visual exam or audio findings with Telehealth visits).    Our team/I connected with Kyeshia A Carra at  2:20 PM EST by a video enabled telemedicine application ([email or web address] or caregility through epic) and verified that I am speaking with the correct person using two identifiers.  Location patient: [place] Location provider: [facility], office Persons participating in the virtual visit:  patient  Our team/I discussed the limitations of evaluation and management by telemedicine and the availability of in person appointments. In light of current covid-19 pandemic, patient also understands that we are trying to protect them by minimizing in office contact if at all possible.  The patient expressed consent for telemedicine visit and agreed to proceed. Patient understands insurance will be billed.   Past Medical History-  Patient Active Problem List   Diagnosis Date Noted   Mild aortic stenosis 07/19/2019    Priority: Medium    Osteoporosis 03/13/2018    Priority: Medium    DDD (degenerative disc disease), lumbar 03/05/2018    Priority: Medium    Aortic atherosclerosis ([place]) 06/12/2017    Priority: Medium    Palpitations 05/12/2017    Priority:  Medium    Vitamin B12 deficiency 05/12/2017    Priority: Medium    Cervical radiculopathy 04/03/2016    Priority: Medium    Essential tremor 04/18/2015    Priority: Medium    Heavy alcohol use 04/14/2014    Priority: Medium    CKD (chronic kidney disease), stage III ([place]) 10/15/2013    Priority: Medium    Former smoker 12/24/2006    Priority: Medium    Hyperlipidemia 05/26/2006    Priority: Medium    GAD (generalized anxiety disorder) 05/26/2006    Priority: Medium    Essential hypertension 05/26/2006    Priority: Medium    Patellofemoral arthritis 03/05/2018    Priority: Low   Unstable gait 01/20/2018    Priority: Low   Adenomatous colon polyp 06/14/2014    Priority: Low   H/O cold sores 10/15/2013    Priority: Low   GERD 05/26/2006    Priority: Low   Osteoporosis without current pathological fracture 09/10/2019    Priority: High   History of lobectomy of thyroid 02/24/2019    Priority: High   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 02/24/2019    Priority: High   Hyperparathyroidism, primary ([place]) 07/17/2018    Priority: High   History of lacunar cerebrovascular accident (CVA) 06/18/2018    Priority: High   Caregiver burden 04/03/2016    Priority: High    Medications- reviewed and updated Current Outpatient Medications  Medication Sig Dispense Refill   aspirin EC 81 MG tablet Take  81 mg by mouth at bedtime.      buPROPion (WELLBUTRIN XL) 150 MG 24 hr tablet Take 1 tablet (150 mg total) by mouth daily. 30 tablet 5   busPIRone (BUSPAR) 5 MG tablet Take 1 tablet (5 mg total) by mouth 2 (two) times daily as needed (anxiety). 60 tablet 5   Cholecalciferol (VITAMIN D3) 50 MCG (2000 UT) TABS Take 2,000 Units by mouth daily.     citalopram (CELEXA) 20 MG tablet TAKE ONE TABLET BY MOUTH ONCE DAILY. 90 tablet 0   diclofenac sodium (VOLTAREN) 1 % GEL Apply 2 g topically 4 (four) times daily. (Patient taking differently: Apply 2 g  topically at bedtime as needed (pain).) 100 g 3   molnupiravir EUA (LAGEVRIO) 200 mg CAPS capsule Take 4 capsules (800 mg total) by mouth 2 (two) times daily for 5 days. 40 capsule 0   pantoprazole (PROTONIX) 20 MG tablet TAKE 1 TABLET ONCE DAILY. 90 tablet 0   rosuvastatin (CRESTOR) 40 MG tablet TAKE ONE TABLET BY MOUTH DAILY. 90 tablet 0   vitamin B-12 (CYANOCOBALAMIN) 1000 MCG tablet Take 1,000 mcg by mouth daily.     No current facility-administered medications for this visit.     Objective:  Temp 100.3 F (37.9 C)   LMP  (LMP Unknown)   SpO2 98%  self reported vitals Gen: NAD, resting comfortably, walks during visit to check on husband and appears comfortable Lungs: nonlabored, normal respiratory rate  Skin: appears dry, no obvious rash     Assessment and Plan   # Covid 19  S:Saturday night she tested positive for covid as well as yesterday-symptoms started on Saturday. . She called emergency # and called a nurse and because she couldn't get meds she states it made her panicked- she called EMS. She had temperature 100.3, runny nose, coughing, very red throat (she said possible strep per EMS). They did not recommend going to hospital. Oxygen 98%. And lungs clear   A/P: Patient with testing confirming covid 19 with first day of covid 19 symptoms - Saturday.  Vaccination status:was updated other than bivalent vaccination per her report   Therefore: - recommended patient watch closely for shortness of breath or confusion or worsening symptoms and if those occur patient should contact us immediately or seek care in the emergency department -recommended patient consider purchasing pulse oximeter and if levels 94% or below persistently- seek care at the hospital - Patient needs to self isolate  for at least 5 days (through Thursday- can go out Friday with mask as long as improving) since first symptom AND at least 24 hours fever free without fever reducing medications AND have  improvement in respiratory symptoms . After 5 days can end self isolation but still needs to wear mask for additional 5 days  - work note not needed -Patient should inform close contacts about exposure (anyone patient been around unmasked for more than 15 minutes)  If High risk for complications-we discussed outpatient therapeutic options including paxlovid,  molnupiravir(and risk of rebound)- both EUA only  - patient opted for molnupiravir (has GFR in low 40s, reduces effectiveness of wellbutrin, also prefer for her to not start statin)  #Hyperlipidemia/aortic atherosclerosis/ history of stroke- on aspirin 81 mg with LDL goal under 70 S: Compliant with rosuvastatin 40 mg.  -Aortic atherosclerosis noted CT 06/26/2017 for lung cancer screening. Risk factor modification with blood pressure and lipid control plan. A/P: with history of stroke - opted to want to continue statin and avoid  paxlovid  #Hypertension/CKD stage III S: Patient remained off lisinopril unfortunately blood pressure remains controlled  -GFR had been stable largely in the 40s lately- in the past had been in the 30s BP Readings from Last 3 Encounters:  10/31/20 116/60  08/11/20 130/78  07/20/20 110/70  A/P: continues to do well with BP at home without meds. With CKD III we opted to avoid paxlovid.  Recommended follow up: Return for as needed for new, worsening, persistent symptoms.  Future Appointments  Date Time Provider [place]  01/03/2021  3:15 PM Lyndal Pulley, DO [facility] None  03/12/2021  1:00 PM Shamleffer, Melanie Crazier, MD [facility] None  11/19/2021  3:15 PM [facility] HEALTH COACH [facility]    Lab/Order associations:   ICD-10-CM   1. COVID-19  U07.1     2. Hyperlipidemia, unspecified hyperlipidemia type  E78.5     3. Essential hypertension  I10     4. Stage 3 chronic kidney disease, unspecified whether stage 3a or 3b CKD (HCC)  N18.30       Meds ordered this encounter  Medications    molnupiravir EUA (LAGEVRIO) 200 mg CAPS capsule    Sig: Take 4 capsules (800 mg total) by mouth 2 (two) times daily for 5 days.    Dispense:  40 capsule    Refill:  0     I,Jada Bradford,acting as a scribe for [name], MD.,have documented all relevant documentation on the behalf of [name], MD,as directed by  [name], MD while in the presence of [name], MD.  I, [name], MD, have reviewed all documentation for this visit. The documentation on 12/18/20 for the exam, diagnosis, procedures, and orders are all accurate and complete.  Return precautions advised.  [name], MD

## 2020-12-18 ENCOUNTER — Telehealth (INDEPENDENT_AMBULATORY_CARE_PROVIDER_SITE_OTHER): Payer: PPO | Admitting: Family Medicine

## 2020-12-18 ENCOUNTER — Encounter: Payer: Self-pay | Admitting: Family Medicine

## 2020-12-18 VITALS — Temp 100.3°F

## 2020-12-18 DIAGNOSIS — Q782 Osteopetrosis: Secondary | ICD-10-CM

## 2020-12-18 DIAGNOSIS — E785 Hyperlipidemia, unspecified: Secondary | ICD-10-CM

## 2020-12-18 DIAGNOSIS — I10 Essential (primary) hypertension: Secondary | ICD-10-CM

## 2020-12-18 DIAGNOSIS — U071 COVID-19: Secondary | ICD-10-CM | POA: Diagnosis not present

## 2020-12-18 DIAGNOSIS — E210 Primary hyperparathyroidism: Secondary | ICD-10-CM

## 2020-12-18 DIAGNOSIS — N183 Chronic kidney disease, stage 3 unspecified: Secondary | ICD-10-CM

## 2020-12-18 DIAGNOSIS — F419 Anxiety disorder, unspecified: Secondary | ICD-10-CM

## 2020-12-18 DIAGNOSIS — R2681 Unsteadiness on feet: Secondary | ICD-10-CM

## 2020-12-18 MED ORDER — MOLNUPIRAVIR EUA 200MG CAPSULE: 4.0000 | ORAL_CAPSULE | Freq: Two times a day (BID) | ORAL | 0 refills | Status: AC

## 2020-12-18 NOTE — Patient Instructions (Signed)
Health Maintenance Due  Topic Date Due   COLONOSCOPY (Pts 45-34yrs Insurance coverage will need to be confirmed)  05/31/2019   COVID-19 Vaccine (4 - Booster for [place] series) 02/10/2020    Recommended follow up: No follow-ups on file.

## 2020-12-27 ENCOUNTER — Other Ambulatory Visit: Payer: Self-pay

## 2020-12-27 ENCOUNTER — Encounter: Payer: Self-pay | Admitting: Family Medicine

## 2020-12-27 ENCOUNTER — Ambulatory Visit (INDEPENDENT_AMBULATORY_CARE_PROVIDER_SITE_OTHER): Payer: PPO | Admitting: Family Medicine

## 2020-12-27 VITALS — BP 130/60 | HR 62 | Temp 97.8°F | Ht 62.5 in | Wt 152.4 lb

## 2020-12-27 DIAGNOSIS — M545 Low back pain, unspecified: Secondary | ICD-10-CM | POA: Diagnosis not present

## 2020-12-27 DIAGNOSIS — B001 Herpesviral vesicular dermatitis: Secondary | ICD-10-CM | POA: Diagnosis not present

## 2020-12-27 DIAGNOSIS — R0981 Nasal congestion: Secondary | ICD-10-CM

## 2020-12-27 DIAGNOSIS — U071 COVID-19: Secondary | ICD-10-CM | POA: Diagnosis not present

## 2020-12-27 MED ORDER — PREDNISONE 20 MG PO TABS: 40.0000 mg | ORAL_TABLET | Freq: Every day | ORAL | 0 refills | Status: AC

## 2020-12-27 NOTE — Patient Instructions (Signed)
Meds have been sent the the pharmacy You can take tylenol for pain/fevers If worsening symptoms, let us know or go to the Emergency room   Take L-Lysine to help prevent fever blisters due to all the stress.

## 2020-12-27 NOTE — Progress Notes (Signed)
Subjective:     Patient ID: Crystal Carroll, female    DOB: 1945-11-27, 75 y.o.   MRN: [id number]  Chief Complaint  Patient presents with   Nasal Congestion    Covid on 12/12, still have congestion    Back Pain    Pulled muscle in assisting husband     HPI Dx w/Covid 12/10.  Was on Molnupiravir.  Still congested.  Not getting better. A lot of stress at home.   Still coughing.  Not sob. No f/c  Fever blister for 5 days R nose so cheek swelling some..  more stress.  No SI  Also, pulled muscle in lower back assisting husband(who has Parkinson's).  Hurts to cough  Health Maintenance Due  Topic Date Due   COLONOSCOPY (Pts 45-49yrs Insurance coverage will need to be confirmed)  05/31/2019   COVID-19 Vaccine (4 - Booster for Pfizer series) 02/10/2020    Past Medical History:  Diagnosis Date   Anxiety    Aortic atherosclerosis ([place])    Aortic valvar stenosis 11/2018   Mild, noted on ECHO   Arthritis    cervical neck   CKD (chronic kidney disease), stage III (HCC)    Stable   GERD (gastroesophageal reflux disease)    History of TIA (transient ischemic attack)    per patient, didn't know about it   Hyperlipidemia    Hyperparathyroidism ([place])    Hypertension    History of    Junctional rhythm    Osteoporosis    Palpitations    Thyroid nodule    2.0 cm solid nodule in the inferior left thyroid lobe     Past Surgical History:  Procedure Laterality Date   CERVICAL LAMINECTOMY  2004   Earle Gell   COLONOSCOPY  05/31/2014   Deatra Ina   ESOPHAGOGASTRODUODENOSCOPY     PARATHYROIDECTOMY Left 01/13/2019   Procedure: LEFT PARATHYROIDECTOMY;  Surgeon: Armandina Gemma, MD;  Location: [facility];  Service: General;  Laterality: Left;   THYROID LOBECTOMY Left 01/13/2019   Procedure: LEFT THYROID LOBECTOMY;  Surgeon: Armandina Gemma, MD;  Location: [facility];  Service: General;  Laterality: Left;    Outpatient Medications Prior to Visit  Medication Sig Dispense Refill   aspirin EC 81 MG  tablet Take 81 mg by mouth at bedtime.      buPROPion (WELLBUTRIN XL) 150 MG 24 hr tablet Take 1 tablet (150 mg total) by mouth daily. 30 tablet 5   busPIRone (BUSPAR) 5 MG tablet Take 1 tablet (5 mg total) by mouth 2 (two) times daily as needed (anxiety). 60 tablet 5   Cholecalciferol (VITAMIN D3) 50 MCG (2000 UT) TABS Take 2,000 Units by mouth daily.     citalopram (CELEXA) 20 MG tablet TAKE ONE TABLET BY MOUTH ONCE DAILY. 90 tablet 0   diclofenac sodium (VOLTAREN) 1 % GEL Apply 2 g topically 4 (four) times daily. (Patient taking differently: Apply 2 g topically at bedtime as needed (pain).) 100 g 3   pantoprazole (PROTONIX) 20 MG tablet TAKE 1 TABLET ONCE DAILY. 90 tablet 0   rosuvastatin (CRESTOR) 40 MG tablet TAKE ONE TABLET BY MOUTH DAILY. 90 tablet 0   vitamin B-12 (CYANOCOBALAMIN) 1000 MCG tablet Take 1,000 mcg by mouth daily.     No facility-administered medications prior to visit.    Allergies  Allergen Reactions   Codeine Nausea Only   Sulfamethoxazole     REACTION: GI upset   OHY:[id number]/[id number] except as noted in HPI  Objective:     BP 130/60    Pulse 62    Temp 97.8 F (36.6 C) (Oral)    Ht 5' 2.5" (1.588 m)    Wt 152 lb 6 oz (69.1 kg)    LMP  (LMP Unknown)    SpO2 99%    BMI 27.43 kg/m  Wt Readings from Last 3 Encounters:  12/27/20 152 lb 6 oz (69.1 kg)  10/31/20 153 lb 6.4 oz (69.6 kg)  08/11/20 154 lb (69.9 kg)        Gen: WDWN NAD older WF HEENT: NCAT, conjunctiva not injected, sclera nonicteric TM WNL B, OP moist, no exudates .  Nose congested NECK:  supple, no thyromegaly, no nodes, no carotid bruits CARDIAC: RRR, S1S2+, no murmur. DP 2+B LUNGS: CTAB. No wheezes ABDOMEN:  BS+, soft, NTND, No HSM, no masses EXT:  no edema MSK: no gross abnormalities. MS 5/5 BLE.  Tender lower back  NEURO: A&O x3.  CN II-XII intact.  PSYCH: normal mood. Good eye contact  Assessment & Plan:   Problem List Items Addressed This Visit   None Visit  Diagnoses     COVID-19    -  Primary   Nasal congestion       Cold sore       Acute bilateral low back pain without sciatica       Relevant Medications   predniSONE (DELTASONE) 20 MG tablet      Covid-recovered from acute.  Still has residual nasal congestion.  Will do prednisone.  If not improved, consider abx.  Warning signs of worsening discussed Cold sore-too late for valtrex.   She can do L lysine if chooses Low back pain-from pulling husband.  Pred should help.  Also, doing icey hot, hot, etc.   Meds ordered this encounter  Medications   predniSONE (DELTASONE) 20 MG tablet    Sig: Take 2 tablets (40 mg total) by mouth daily with breakfast for 5 days.    Dispense:  10 tablet    Refill:  0    Wellington Hampshire., MD

## 2021-01-03 ENCOUNTER — Ambulatory Visit: Payer: PPO | Admitting: Family Medicine

## 2021-01-05 ENCOUNTER — Ambulatory Visit (INDEPENDENT_AMBULATORY_CARE_PROVIDER_SITE_OTHER): Payer: PPO | Admitting: Physician Assistant

## 2021-01-05 ENCOUNTER — Other Ambulatory Visit: Payer: Self-pay

## 2021-01-05 ENCOUNTER — Encounter: Payer: Self-pay | Admitting: Physician Assistant

## 2021-01-05 VITALS — BP 137/51 | HR 69 | Temp 97.4°F | Ht 62.5 in | Wt 152.8 lb

## 2021-01-05 DIAGNOSIS — M545 Low back pain, unspecified: Secondary | ICD-10-CM

## 2021-01-05 MED ORDER — GABAPENTIN 100 MG PO CAPS: 200.0000 mg | ORAL_CAPSULE | Freq: Every day | ORAL | 1 refills | Status: DC

## 2021-01-05 NOTE — Progress Notes (Signed)
Crystal Carroll is a 75 y.o. female here for back pain.   History of Present Illness:   Chief Complaint  Patient presents with   Back Pain    Started 3 days ago. She says that her husband has Parkinson's and she has helped him a lot over the years. She says that it has finally caught up with her. She has taken OTC Advil but has not worked as well.     HPI  Back Pain Crystal Carroll presents with c/o back pain. She has seen Dr. Cherlynn Kaiser about this issue in addition to post covid 19 cough/congestion on 12/27/20. During this visit she stated that she initially experienced this pain upon helping her husband, who has parkinson's disease up. Following this visit she was prescribed prednisone 20 mg x 5 days for her sx which provided major relief for her cough sx, but didn't improve her back pain.   Currently Crystal Carroll reports her pain has worsened over the past 3 days. States she tried ibuprofen and lidocaine patches but only found relief with the ibuprofen. Denies bowel/urinary incontinence, pain with urination, numbness/tingling down legs.  She has hx of back pain. Has seen Dr. Charlann Boxer about this and was given gabapentin 200 mg nightly, she admits that she never took this. Currently seeing him for her knees.  Past Medical History:  Diagnosis Date   Anxiety    Aortic atherosclerosis ([place])    Aortic valvar stenosis 11/2018   Mild, noted on ECHO   Arthritis    cervical neck   CKD (chronic kidney disease), stage III (HCC)    Stable   GERD (gastroesophageal reflux disease)    History of TIA (transient ischemic attack)    per patient, didn't know about it   Hyperlipidemia    Hyperparathyroidism ([place])    Hypertension    History of    Junctional rhythm    Osteoporosis    Palpitations    Thyroid nodule    2.0 cm solid nodule in the inferior left thyroid lobe      Social History   Tobacco Use   Smoking status: Former    Packs/day: 0.75    Years: 50.00    Pack years: 37.50    Types:  Cigarettes    Quit date: 03/07/2013    Years since quitting: 7.8   Smokeless tobacco: Never  Vaping Use   Vaping Use: Never used  Substance Use Topics   Alcohol use: Yes    Comment: 1-2 daily spritzers per pt   Drug use: No    Past Surgical History:  Procedure Laterality Date   CERVICAL LAMINECTOMY  2004   Earle Gell   COLONOSCOPY  05/31/2014   Deatra Ina   ESOPHAGOGASTRODUODENOSCOPY     PARATHYROIDECTOMY Left 01/13/2019   Procedure: LEFT PARATHYROIDECTOMY;  Surgeon: Armandina Gemma, MD;  Location: [facility];  Service: General;  Laterality: Left;   THYROID LOBECTOMY Left 01/13/2019   Procedure: LEFT THYROID LOBECTOMY;  Surgeon: Armandina Gemma, MD;  Location: [facility];  Service: General;  Laterality: Left;    Family History  Problem Relation Age of Onset   Melanoma Mother 76       labia   Heart attack Father 55       smoker, rheumatic fever as child   Colon cancer Neg Hx    Colon polyps Neg Hx    Esophageal cancer Neg Hx    Rectal cancer Neg Hx    Stomach cancer Neg Hx  Allergies  Allergen Reactions   Codeine Nausea Only   Sulfamethoxazole     REACTION: GI upset    Current Medications:   Current Outpatient Medications:    aspirin EC 81 MG tablet, Take 81 mg by mouth at bedtime. , Disp: , Rfl:    buPROPion (WELLBUTRIN XL) 150 MG 24 hr tablet, Take 1 tablet (150 mg total) by mouth daily., Disp: 30 tablet, Rfl: 5   busPIRone (BUSPAR) 5 MG tablet, Take 1 tablet (5 mg total) by mouth 2 (two) times daily as needed (anxiety)., Disp: 60 tablet, Rfl: 5   Cholecalciferol (VITAMIN D3) 50 MCG (2000 UT) TABS, Take 2,000 Units by mouth daily., Disp: , Rfl:    citalopram (CELEXA) 20 MG tablet, TAKE ONE TABLET BY MOUTH ONCE DAILY., Disp: 90 tablet, Rfl: 0   diclofenac sodium (VOLTAREN) 1 % GEL, Apply 2 g topically 4 (four) times daily. (Patient taking differently: Apply 2 g topically at bedtime as needed (pain).), Disp: 100 g, Rfl: 3   pantoprazole (PROTONIX) 20 MG tablet, TAKE 1 TABLET  ONCE DAILY., Disp: 90 tablet, Rfl: 0   rosuvastatin (CRESTOR) 40 MG tablet, TAKE ONE TABLET BY MOUTH DAILY., Disp: 90 tablet, Rfl: 0   vitamin B-12 (CYANOCOBALAMIN) 1000 MCG tablet, Take 1,000 mcg by mouth daily., Disp: , Rfl:    SHINGRIX injection, , Disp: , Rfl:    Review of Systems:   ROS Negative unless otherwise specified per HPI. Vitals:   Vitals:   01/05/21 1256  BP: (!) 137/51  Pulse: 69  Temp: (!) 97.4 F (36.3 C)  TempSrc: Temporal  SpO2: 98%  Weight: 152 lb 12.8 oz (69.3 kg)  Height: 5' 2.5" (1.588 m)     Body mass index is 27.5 kg/m.  Physical Exam:   Physical Exam Vitals and nursing note reviewed.  Constitutional:      General: She is not in acute distress.    Appearance: She is well-developed. She is not ill-appearing or toxic-appearing.  Cardiovascular:     Rate and Rhythm: Normal rate and regular rhythm.     Pulses: Normal pulses.     Heart sounds: Normal heart sounds, S1 normal and S2 normal.  Pulmonary:     Effort: Pulmonary effort is normal.     Breath sounds: Normal breath sounds.  Musculoskeletal:     Comments: Slight decreased ROM 2/2 pain with flexion/extension. Reproducible tenderness to R lumbar paraspinal muscles. No bony tenderness. No evidence of erythema, rash or ecchymosis. Negative STLR bilaterally.   Skin:    General: Skin is warm and dry.  Neurological:     Mental Status: She is alert.     GCS: GCS eye subscore is 4. GCS verbal subscore is 5. GCS motor subscore is 6.  Psychiatric:        Speech: Speech normal.        Behavior: Behavior normal. Behavior is cooperative.    Assessment and Plan:   Right lumbar back pain No red flags on exam Suspect back strain Administered DepoMedrol 40 mg today, patient tolerated well Start gabapentin 200 mg nightly, may decreased to 100 mg nightly if drowsiness occurs Follow up with Dr. Tamala Julian, Sports Medicine if symptoms worsen or persist ER precautions advised  I,Havlyn C Ratchford,acting  as a scribe for [name], PA.,have documented all relevant documentation on the behalf of [name], PA,as directed by  [name], PA while in the presence of [name], [name].  I, [name], [name], have reviewed all documentation  for this visit. The documentation on 01/05/21 for the exam, diagnosis, procedures, and orders are all accurate and complete.  [name], PA-C

## 2021-01-05 NOTE — Patient Instructions (Signed)
It was great to see you!  Steroid injection today to help with your back  Start 200 mg gabapentin at night for your back If 200 mg makes you drowsy, take 100 mg the next night Follow-up with Dr. Tamala Julian for your back  Take care,  [name] PA-C

## 2021-01-10 ENCOUNTER — Telehealth: Payer: Self-pay | Admitting: Family Medicine

## 2021-01-10 NOTE — Telephone Encounter (Signed)
Pt called and said that she saw [name] 01/05/21 and that her back is irritated again while trying to help her husband, she wanted to know what else she can do and if she can get a call back. Call back  [phone]

## 2021-01-12 NOTE — Telephone Encounter (Signed)
Please see message and advise 

## 2021-01-12 NOTE — Telephone Encounter (Signed)
Left message on home and mobile voicemail to call office.

## 2021-01-15 DIAGNOSIS — Z20822 Contact with and (suspected) exposure to covid-19: Secondary | ICD-10-CM | POA: Diagnosis not present

## 2021-01-16 NOTE — Telephone Encounter (Signed)
Left message on voicemail to call office.  

## 2021-01-17 ENCOUNTER — Ambulatory Visit (INDEPENDENT_AMBULATORY_CARE_PROVIDER_SITE_OTHER): Payer: PPO | Admitting: Sports Medicine

## 2021-01-17 ENCOUNTER — Other Ambulatory Visit: Payer: Self-pay

## 2021-01-17 VITALS — BP 120/90 | HR 67 | Ht 62.0 in | Wt 147.0 lb

## 2021-01-17 DIAGNOSIS — M5441 Lumbago with sciatica, right side: Secondary | ICD-10-CM

## 2021-01-17 DIAGNOSIS — M5442 Lumbago with sciatica, left side: Secondary | ICD-10-CM

## 2021-01-17 DIAGNOSIS — G8929 Other chronic pain: Secondary | ICD-10-CM

## 2021-01-17 DIAGNOSIS — M5136 Other intervertebral disc degeneration, lumbar region: Secondary | ICD-10-CM

## 2021-01-17 MED ORDER — MELOXICAM 15 MG PO TABS: 15.0000 mg | ORAL_TABLET | Freq: Every day | ORAL | 0 refills | Status: DC

## 2021-01-17 NOTE — Progress Notes (Signed)
Benito Mccreedy D.[place] Phone: [phone]   Assessment and Plan:     1. DDD (degenerative disc disease), lumbar 2. Chronic bilateral low back pain with bilateral sciatica -Chronic with exacerbation, subsequent sports medicine visit - Likely an acute flare of low back pain with underlying severe stenosis of lumbar spine, history of disc protrusion at L4, lumbar spine osteophytes as seen on MRI from 10/13/2018 - No significant new trauma, red flag symptoms, so no additional imaging at this time - Start meloxicam 15 mg daily for the next 3 weeks.  Remainder may be used as needed for pain control.  Instructed to stop all other NSAID use while on meloxicam.  Do not plan on using this medication for chronic pain relief due to patient age and comorbidities - Start Tylenol 500 mg 2 tablets 3 times a day for daily pain relief.  This may be an option for chronic pain relief - Start HEP for low back   Pertinent previous records reviewed include MRI 10/13/2018   Follow Up: 3 weeks for reevaluation.  If significant relief in symptoms, would continue with the same plan and changed to using meloxicam as needed.  If incomplete or no relief of symptoms, could consider x-rays +/- advanced imaging +/- physical therapy +/- epidural/facet injections, +/- gabapentin.   Subjective:   I, Pincus Badder, am serving as a Education administrator for [employer]  Chief Complaint: mid back and leg pain   HPI:   01/17/21 Patient is a 76 year old female complaining of back and leg pain. Patient states that the pain has been going on for about a week She says that her husband has Parkinson's and she has helped him a lot over the years. She says that it has finally caught up with her. Is also complaining of arm pain .She has taken OTC Advil but has not worked as well. no numbness , tingling , no radiating pain   Relevant Historical Information:  Hypertension, osteoporosis, CKD stage III, history of CVA  Additional pertinent review of systems negative.   Current Outpatient Medications:    aspirin EC 81 MG tablet, Take 81 mg by mouth at bedtime. , Disp: , Rfl:    buPROPion (WELLBUTRIN XL) 150 MG 24 hr tablet, Take 1 tablet (150 mg total) by mouth daily., Disp: 30 tablet, Rfl: 5   busPIRone (BUSPAR) 5 MG tablet, Take 1 tablet (5 mg total) by mouth 2 (two) times daily as needed (anxiety)., Disp: 60 tablet, Rfl: 5   Cholecalciferol (VITAMIN D3) 50 MCG (2000 UT) TABS, Take 2,000 Units by mouth daily., Disp: , Rfl:    citalopram (CELEXA) 20 MG tablet, TAKE ONE TABLET BY MOUTH ONCE DAILY., Disp: 90 tablet, Rfl: 0   diclofenac sodium (VOLTAREN) 1 % GEL, Apply 2 g topically 4 (four) times daily. (Patient taking differently: Apply 2 g topically at bedtime as needed (pain).), Disp: 100 g, Rfl: 3   gabapentin (NEURONTIN) 100 MG capsule, Take 2 capsules (200 mg total) by mouth at bedtime., Disp: 60 capsule, Rfl: 1   meloxicam (MOBIC) 15 MG tablet, Take 1 tablet (15 mg total) by mouth daily., Disp: 30 tablet, Rfl: 0   pantoprazole (PROTONIX) 20 MG tablet, TAKE 1 TABLET ONCE DAILY., Disp: 90 tablet, Rfl: 0   rosuvastatin (CRESTOR) 40 MG tablet, TAKE ONE TABLET BY MOUTH DAILY., Disp: 90 tablet, Rfl: 0   SHINGRIX injection, , Disp: , Rfl:  vitamin B-12 (CYANOCOBALAMIN) 1000 MCG tablet, Take 1,000 mcg by mouth daily., Disp: , Rfl:    Objective:     Vitals:   01/17/21 1400  BP: 120/90  Pulse: 67  SpO2: 97%  Weight: 147 lb (66.7 kg)  Height: 5\' 2"  (1.575 m)      Body mass index is 26.89 kg/m.    Physical Exam:    Gen: Appears well, nad, nontoxic and pleasant Psych: Alert and oriented, appropriate mood and affect Neuro: sensation intact, strength is 5/5 in upper and lower extremities, muscle tone wnl Skin: no susupicious lesions or rashes  Back - Normal skin, Spine with normal alignment and no deformity.   No tenderness to vertebral  process palpation.   Paraspinous muscles are moderately tender in lumbar region bilaterally and without spasm Straight leg raise negative  Gait steady and stable   Electronically signed by:  Benito Mccreedy D.[facility] 2:56 PM 01/17/21

## 2021-01-17 NOTE — Patient Instructions (Addendum)
Good to see you Meloxicam 15 mg for 3 weeks remainder as needed Start Tylenol 500 mg  2 tablets 3 times a day as needed for pain  Low back HEP 3 week follow up

## 2021-01-17 NOTE — Telephone Encounter (Signed)
Tried to contact pt several times, has not returned our phone calls.

## 2021-01-22 ENCOUNTER — Encounter (HOSPITAL_COMMUNITY): Payer: Self-pay | Admitting: Anesthesiology

## 2021-01-22 ENCOUNTER — Telehealth: Payer: Self-pay | Admitting: Gastroenterology

## 2021-01-22 DIAGNOSIS — Z20822 Contact with and (suspected) exposure to covid-19: Secondary | ICD-10-CM | POA: Diagnosis not present

## 2021-01-22 NOTE — Telephone Encounter (Signed)
Patient called wanting to know why she is having her procedure at the hospital 1/26. Seeking advice, Please advise.

## 2021-01-22 NOTE — Telephone Encounter (Signed)
Spoke with pt and explained that her chart was reviewed and she is considered a difficult intubation. Procedure being done at the hospital for her safety if any issues arose. Pt verbalized understanding.

## 2021-01-23 ENCOUNTER — Encounter (HOSPITAL_COMMUNITY): Payer: Self-pay | Admitting: Gastroenterology

## 2021-01-25 ENCOUNTER — Other Ambulatory Visit: Payer: Self-pay | Admitting: Family Medicine

## 2021-01-31 NOTE — Anesthesia Preprocedure Evaluation (Deleted)
Anesthesia Evaluation    Reviewed: Allergy & Precautions, Patient's Chart, lab work & pertinent test results  History of Anesthesia Complications (+) DIFFICULT AIRWAY and history of anesthetic complications (last airway note: Grade 1 View with glidescope)  Airway        Dental   Pulmonary neg pulmonary ROS, former smoker,           Cardiovascular hypertension, Pt. on medications   TTE 2022 1. The aortic valve is tricuspid. There is mild calcification of the  aortic valve. There is mild thickening of the aortic valve. Aortic valve  regurgitation is not visualized. Aortic valve area, by VTI measures 1.98  cm. Aortic valve mean gradient  measures 12.0 mmHg. Aortic valve Vmax measures 2.41 m/s.  2. Left ventricular ejection fraction, by estimation, is 60 to 65%. Left  ventricular ejection fraction by 3D volume is 62 %. The left ventricle has  normal function. The left ventricle has no regional wall motion  abnormalities. Left ventricular diastolic  parameters were normal. The average left ventricular global longitudinal  strain is -20.6 %. The global longitudinal strain is normal.  3. Right ventricular systolic function is normal. The right ventricular  size is normal. Tricuspid regurgitation signal is inadequate for assessing  PA pressure.  4. The mitral valve is grossly normal. No evidence of mitral valve  regurgitation. No evidence of mitral stenosis.  5. The inferior vena cava is normal in size with greater than 50%  respiratory variability, suggesting right atrial pressure of 3 mmHg.    Neuro/Psych PSYCHIATRIC DISORDERS Anxiety TIACVA    GI/Hepatic Neg liver ROS, GERD  ,  Endo/Other  negative endocrine ROS  Renal/GU Renal InsufficiencyRenal disease  negative genitourinary   Musculoskeletal  (+) Arthritis ,   Abdominal   Peds  Hematology negative hematology ROS (+)   Anesthesia Other Findings    Reproductive/Obstetrics                             Anesthesia Physical Anesthesia Plan  ASA: 3  Anesthesia Plan: MAC   Post-op Pain Management:    Induction: Intravenous  PONV Risk Score and Plan: Propofol infusion and Treatment may vary due to age or medical condition  Airway Management Planned: Natural Airway  Additional Equipment:   Intra-op Plan:   Post-operative Plan:   Informed Consent: I have reviewed the patients History and Physical, chart, labs and discussed the procedure including the risks, benefits and alternatives for the proposed anesthesia with the patient or authorized representative who has indicated his/her understanding and acceptance.     Dental advisory given  Plan Discussed with: CRNA  Anesthesia Plan Comments: (Case cancelled. Pt did not show up for procedure. )       Anesthesia Quick Evaluation

## 2021-02-01 ENCOUNTER — Ambulatory Visit (HOSPITAL_COMMUNITY): Admission: RE | Admit: 2021-02-01 | Payer: PPO | Source: Home / Self Care | Admitting: Gastroenterology

## 2021-02-01 ENCOUNTER — Telehealth: Payer: Self-pay

## 2021-02-01 SURGERY — COLONOSCOPY WITH PROPOFOL
Anesthesia: Monitor Anesthesia Care

## 2021-02-01 NOTE — Telephone Encounter (Signed)
Patient scheduled to see Dr. Candis Schatz 02/27/21 at 1:50pm.         Daryel November, MD  Paulo Fruit, RN; Algernon Huxley, RN Thank you Santiago Glad.   This is apparently the second time she has cancelled a hospital colonoscopy slot.  I would like to see her in clinic before we book a third hospital colonoscopy endoscopy appointment for her.   Vaughan Basta,  Can you facilitate a routine clinic appointment to discuss her colonoscopy further?   Thanks        Previous Messages Attached Notes  Progress Notes by Paulo Fruit, RN at 02/01/2021  7:04 AM  Author: Paulo Fruit, RN Service: Endoscopy Author Type: Registered Nurse  Filed: 02/01/2021  7:06 AM Date of Service: 02/01/2021  7:04 AM Note Type: Progress Notes  Status: Signed Editor: Paulo Fruit, RN (Registered Nurse)  Called patient at 0700 when they did not show up at time scheduled. Patient stated that she called the office last night at 8pm and told them that the prep was making her sick and she could not drink it. She said the person who took her message stated that someone would call her back. She stated no one ever did. She wants to reschedule. I told her office would reschedule her.

## 2021-02-01 NOTE — Progress Notes (Signed)
Called patient at 0700 when they did not show up at time scheduled. Patient stated that she called the office last night at 8pm and told them that the prep was making her sick and she could not drink it. She said the person who took her message stated that someone would call her back. She stated no one ever did. She wants to reschedule. I told her office would reschedule her.

## 2021-02-06 NOTE — Progress Notes (Signed)
Benito Mccreedy D.[place] Phone: [phone]   Assessment and Plan:     1. DDD (degenerative disc disease), lumbar 2. Chronic bilateral low back pain with bilateral sciatica -Chronic with exacerbation, subsequent visit - Significantly improved flare of chronic low back pain with radicular symptoms after daily use of meloxicam x3 weeks - Use Tylenol 500 to 1000 mg 2-3 times a day for daily pain - Discontinue daily use of meloxicam and may use remainder as needed for breakthrough pain -Recommend continuing physical activity as tolerated including HEP  Pertinent previous records reviewed include none   Follow Up: As needed if no improvement or worsening of symptoms.  If back pain recurs or worsens, we could trial a repeat course of NSAIDs versus repeat x-ray, or advanced imaging   Subjective:   I, [place], am serving as a Education administrator for [employer]  Chief Complaint: mid back and leg pain   HPI:  01/17/21 Patient is a 76 year old female complaining of back and leg pain. Patient states that the pain has been going on for about a week She says that her husband has Parkinson's and she has helped him a lot over the years. She says that it has finally caught up with her. Is also complaining of arm pain .She has taken OTC Advil but has not worked as well. no numbness , tingling , no radiating pain   02/07/2021 Patient states that she is doing well has been doing everything that he prescribed , hasn't been doing HEP but back is better , pains come and go    Relevant Historical Information: Hypertension, osteoporosis, CKD stage III, history of CVA  Additional pertinent review of systems negative.   Current Outpatient Medications:    acetaminophen (TYLENOL) 500 MG tablet, Take 1,000 mg by mouth 3 (three) times daily., Disp: , Rfl:    aspirin EC 81 MG tablet, Take 81 mg by mouth at bedtime. , Disp: ,  Rfl:    buPROPion (WELLBUTRIN XL) 150 MG 24 hr tablet, Take 1 tablet (150 mg total) by mouth daily., Disp: 30 tablet, Rfl: 5   busPIRone (BUSPAR) 5 MG tablet, Take 1 tablet (5 mg total) by mouth 2 (two) times daily as needed (anxiety). (Patient taking differently: Take 5 mg by mouth See admin instructions. Take 5 mg daily, may take a second 5 mg dose as needed for anxiety), Disp: 60 tablet, Rfl: 5   Cholecalciferol (VITAMIN D3) 50 MCG (2000 UT) TABS, Take 2,000 Units by mouth daily., Disp: , Rfl:    citalopram (CELEXA) 20 MG tablet, TAKE ONE TABLET BY MOUTH ONCE DAILY., Disp: 90 tablet, Rfl: 0   gabapentin (NEURONTIN) 100 MG capsule, Take 2 capsules (200 mg total) by mouth at bedtime., Disp: 60 capsule, Rfl: 1   meloxicam (MOBIC) 15 MG tablet, Take 1 tablet (15 mg total) by mouth daily., Disp: 30 tablet, Rfl: 0   pantoprazole (PROTONIX) 20 MG tablet, TAKE 1 TABLET ONCE DAILY., Disp: 90 tablet, Rfl: 0   rosuvastatin (CRESTOR) 40 MG tablet, TAKE ONE TABLET BY MOUTH DAILY., Disp: 90 tablet, Rfl: 0   vitamin B-12 (CYANOCOBALAMIN) 1000 MCG tablet, Take 1,000 mcg by mouth daily., Disp: , Rfl:    diclofenac sodium (VOLTAREN) 1 % GEL, Apply 2 g topically 4 (four) times daily. (Patient taking differently: Apply 2 g topically 4 (four) times daily as needed (pain).), Disp: 100 g, Rfl: 3   Objective:  Vitals:   02/07/21 1337  BP: 118/70  Pulse: 69  SpO2: 97%  Weight: 156 lb (70.8 kg)  Height: 5\' 2"  (1.575 m)      Body mass index is 28.53 kg/m.    Physical Exam:    Gen: Appears well, nad, nontoxic and pleasant Psych: Alert and oriented, appropriate mood and affect Neuro: sensation intact, strength is 5/5 in upper and lower extremities, muscle tone wnl Skin: no susupicious lesions or rashes  Back - Normal skin, Spine with normal alignment and no deformity.   No tenderness to vertebral process palpation.   Paraspinous muscles are not tender and without spasm Straight leg raise  negative Trendelenberg negative    Electronically signed by:  Benito Mccreedy D.[facility] 1:59 PM 02/07/21

## 2021-02-07 ENCOUNTER — Ambulatory Visit: Payer: PPO | Admitting: Sports Medicine

## 2021-02-07 ENCOUNTER — Other Ambulatory Visit: Payer: Self-pay

## 2021-02-07 VITALS — BP 118/70 | HR 69 | Ht 62.0 in | Wt 156.0 lb

## 2021-02-07 DIAGNOSIS — M5136 Other intervertebral disc degeneration, lumbar region: Secondary | ICD-10-CM

## 2021-02-07 DIAGNOSIS — G8929 Other chronic pain: Secondary | ICD-10-CM

## 2021-02-07 DIAGNOSIS — M5441 Lumbago with sciatica, right side: Secondary | ICD-10-CM | POA: Diagnosis not present

## 2021-02-07 DIAGNOSIS — M5442 Lumbago with sciatica, left side: Secondary | ICD-10-CM | POA: Diagnosis not present

## 2021-02-07 NOTE — Patient Instructions (Addendum)
Good to see you  Discontinue daily meloxicam  Use tylenol 500-100 mg 2 times a day for pain relief NSAIDs as needed for breakthrough pain  Follow up as needed

## 2021-02-14 ENCOUNTER — Other Ambulatory Visit: Payer: Self-pay | Admitting: Family Medicine

## 2021-02-22 DIAGNOSIS — D1801 Hemangioma of skin and subcutaneous tissue: Secondary | ICD-10-CM | POA: Diagnosis not present

## 2021-02-22 DIAGNOSIS — D225 Melanocytic nevi of trunk: Secondary | ICD-10-CM | POA: Diagnosis not present

## 2021-02-22 DIAGNOSIS — L821 Other seborrheic keratosis: Secondary | ICD-10-CM | POA: Diagnosis not present

## 2021-02-22 DIAGNOSIS — L918 Other hypertrophic disorders of the skin: Secondary | ICD-10-CM | POA: Diagnosis not present

## 2021-02-27 ENCOUNTER — Ambulatory Visit (INDEPENDENT_AMBULATORY_CARE_PROVIDER_SITE_OTHER): Payer: PPO | Admitting: Gastroenterology

## 2021-02-27 ENCOUNTER — Encounter: Payer: Self-pay | Admitting: Gastroenterology

## 2021-02-27 VITALS — BP 120/76 | HR 76 | Ht 62.0 in | Wt 150.0 lb

## 2021-02-27 DIAGNOSIS — Z86010 Personal history of colonic polyps: Secondary | ICD-10-CM

## 2021-02-27 NOTE — Patient Instructions (Signed)
If you are age 77 or older, your body mass index should be between 23-30. Your Body mass index is 27.44 kg/m. If this is out of the aforementioned range listed, please consider follow up with your Primary Care Provider.  If you are age 25 or younger, your body mass index should be between 19-25. Your Body mass index is 27.44 kg/m. If this is out of the aformentioned range listed, please consider follow up with your Primary Care Provider.    The [facility] providers would like to encourage you to use [facility] to communicate with providers for non-urgent requests or questions.  Due to long hold times on the telephone, sending your provider a message by [facility] may be a faster and more efficient way to get a response.  Please allow 48 business hours for a response.  Please remember that this is for non-urgent requests.   It was a pleasure to see you today!  Thank you for trusting me with your gastrointestinal care!    Scott E.Candis Schatz, MD

## 2021-02-27 NOTE — Progress Notes (Signed)
HPI : Crystal Carroll is a very pleasant 76 year old female with history of aortic valvular stenosis (mild), CKD and history of colon polyps who is referred to [place] by Dr. [name] for surveillance colonoscopy.  The patient had been scheduled for a surveillance colonoscopy in July 2022, but had her appointment moved to the hospital setting because of a history of a difficult airway.  Her colonoscopy was scheduled for August 9, but she had to cancel last minute due to a medical emergency with her husband.  The colonoscopy was rescheduled in the hospital for January 26, but she was unable to complete this because she has significant nausea and vomiting with the bowel prep. The patient denies any chronic GI symptoms.  She has regular bowel movements, denies problems with constipation, diarrhea or blood in her stool.  She has no family history of colon cancer. She has had 2 previous colonoscopies.  On her index colonoscopy in 2009, she had 3 small polyps, largest 8 mm, all tubular adenomas.  On her surveillance colonoscopy in 2016, she had a 3 mm tubular adenoma and was recommended repeat in 5 years.  She has a history of aortic stenosis, which is asymptomatic and has been monitored with serial echocardiograms.  Otherwise, she denies any chronic cardiopulmonary comorbidities.    Past Medical History:  Diagnosis Date   Anxiety    Aortic atherosclerosis ([place])    Aortic valvar stenosis 11/2018   Mild, noted on ECHO   Arthritis    cervical neck   CKD (chronic kidney disease), stage III (HCC)    Stable   GERD (gastroesophageal reflux disease)    History of TIA (transient ischemic attack)    per patient, didn't know about it   Hyperlipidemia    Hyperparathyroidism ([place])    Hypertension    History of    Junctional rhythm    Osteoporosis    Palpitations    Thyroid nodule    2.0 cm solid nodule in the inferior left thyroid lobe    Colonoscopy, May 31, 2014: 3 mm polyp in transverse colon  (tubular adenoma) Colonoscopy Feb 2009:  3 polyps, largest 8 mm, all tubular adenomas  Past Surgical History:  Procedure Laterality Date   CERVICAL LAMINECTOMY  2004   Earle Gell   COLONOSCOPY  05/31/2014   Deatra Ina   ESOPHAGOGASTRODUODENOSCOPY     PARATHYROIDECTOMY Left 01/13/2019   Procedure: LEFT PARATHYROIDECTOMY;  Surgeon: Armandina Gemma, MD;  Location: [facility];  Service: General;  Laterality: Left;   THYROID LOBECTOMY Left 01/13/2019   Procedure: LEFT THYROID LOBECTOMY;  Surgeon: Armandina Gemma, MD;  Location: [facility];  Service: General;  Laterality: Left;   Family History  Problem Relation Age of Onset   Melanoma Mother 45       labia   Heart attack Father 55       smoker, rheumatic fever as child   Colon cancer Neg Hx    Colon polyps Neg Hx    Esophageal cancer Neg Hx    Rectal cancer Neg Hx    Stomach cancer Neg Hx    Social History   Tobacco Use   Smoking status: Former    Packs/day: 0.75    Years: 50.00    Pack years: 37.50    Types: Cigarettes    Quit date: 03/07/2013    Years since quitting: 7.9   Smokeless tobacco: Never  Vaping Use   Vaping Use: Never used  Substance Use Topics   Alcohol use: Yes  Comment: 1-2 daily spritzers per pt   Drug use: No   Current Outpatient Medications  Medication Sig Dispense Refill   acetaminophen (TYLENOL) 500 MG tablet Take 1,000 mg by mouth 3 (three) times daily.     aspirin EC 81 MG tablet Take 81 mg by mouth at bedtime.      buPROPion (WELLBUTRIN XL) 150 MG 24 hr tablet Take 1 tablet (150 mg total) by mouth daily. 30 tablet 5   busPIRone (BUSPAR) 5 MG tablet Take 1 tablet (5 mg total) by mouth 2 (two) times daily as needed (anxiety). (Patient taking differently: Take 5 mg by mouth See admin instructions. Take 5 mg daily, may take a second 5 mg dose as needed for anxiety) 60 tablet 5   Cholecalciferol (VITAMIN D3) 50 MCG (2000 UT) TABS Take 2,000 Units by mouth daily.     citalopram (CELEXA) 20 MG tablet TAKE ONE TABLET BY  MOUTH ONCE DAILY. 90 tablet 0   gabapentin (NEURONTIN) 100 MG capsule Take 2 capsules (200 mg total) by mouth at bedtime. 60 capsule 1   meloxicam (MOBIC) 15 MG tablet Take 1 tablet (15 mg total) by mouth daily. 30 tablet 0   pantoprazole (PROTONIX) 20 MG tablet TAKE 1 TABLET ONCE DAILY. 90 tablet 0   rosuvastatin (CRESTOR) 40 MG tablet TAKE ONE TABLET BY MOUTH DAILY 90 tablet 0   vitamin B-12 (CYANOCOBALAMIN) 1000 MCG tablet Take 1,000 mcg by mouth daily.     No current facility-administered medications for this visit.   Allergies  Allergen Reactions   Codeine Nausea Only   Sulfamethoxazole     GI upset     Review of Systems: All systems reviewed and negative except where noted in HPI.    No results found.  Physical Exam: BP 120/76    Pulse 76    Ht 5\' 2"  (1.575 m)    Wt 150 lb (68 kg)    LMP  (LMP Unknown)    BMI 27.44 kg/m  Constitutional: Pleasant,well-developed, Caucasian female in no acute distress. HEENT: Normocephalic and atraumatic. Conjunctivae are normal. No scleral icterus. Neck supple.  Cardiovascular: Normal rate, regular rhythm.  3/6 systolic murmur, left upper sternal border Pulmonary/chest: Effort normal and breath sounds normal. No wheezing, rales or rhonchi. Abdominal: Soft, nondistended, nontender. Bowel sounds active throughout. There are no masses palpable. No hepatomegaly. Extremities: no edema Neurological: Alert and oriented to person place and time. Skin: Skin is warm and dry. No rashes noted. Psychiatric: Normal mood and affect. Behavior is normal.  CBC    Component Value Date/Time   WBC 6.5 07/20/2020 1559   RBC 4.46 07/20/2020 1559   HGB 13.3 07/20/2020 1559   HCT 38.3 07/20/2020 1559   PLT 193.0 07/20/2020 1559   MCV 85.9 07/20/2020 1559   MCH 29.3 01/06/2019 1442   MCHC 34.8 07/20/2020 1559   RDW 14.0 07/20/2020 1559   LYMPHSABS 1.2 07/20/2020 1559   MONOABS 0.6 07/20/2020 1559   EOSABS 0.2 07/20/2020 1559   BASOSABS 0.1 07/20/2020  1559    CMP     Component Value Date/Time   NA 140 07/20/2020 1559   K 4.1 07/20/2020 1559   CL 103 07/20/2020 1559   CO2 28 07/20/2020 1559   GLUCOSE 78 07/20/2020 1559   BUN 20 07/20/2020 1559   CREATININE 1.29 (H) 07/20/2020 1559   CREATININE 1.50 (H) 05/22/2018 1513   CALCIUM 9.6 07/20/2020 1559   CALCIUM 10.7 (H) 07/26/2009 2346   PROT 7.2 07/20/2020 1559  ALBUMIN 4.6 07/20/2020 1559   AST 13 07/20/2020 1559   ALT 7 07/20/2020 1559   ALKPHOS 64 07/20/2020 1559   BILITOT 0.6 07/20/2020 1559   GFRNONAA 45 (L) 01/14/2019 0421   GFRAA 52 (L) 01/14/2019 0421     ASSESSMENT AND PLAN: 76 year old female with history of low risk colon polyps (last colonoscopy, 2016 with 3 mm tubular adenoma), without any GI symptoms and no family history of colon cancer.  Due to a history of difficult intubation, she was recommended to have her colonoscopy performed in hospital setting. We discussed the most recent guidelines for polyp surveillance and colon cancer screening.  We discussed how past the age of 63, recommendations for colon cancer screening are taken on a case-by-case basis, taking into account patient's comorbidities and overall risk for colon cancer.  Based on her previous endoscopic findings, the patient is low risk for colon cancer.  Although the patient is otherwise fairly healthy, I do not think the benefits of surveillance colonoscopy clearly outweigh the risks in this situation.  I told the patient that if she wanted to proceed with another surveillance colonoscopy would be reasonable, but that if she did not want to, this is also reasonable given her age and low risk for colon cancer. Patient declined further colonoscopies, unless absolutely necessary.  History of polyps, low risk for colon cancer - Given advanced age, recommend against further surveillance colonoscopies  Yecheskel Kurek E. Candis Schatz, MD [facility]  CC:  Marin Olp, MD

## 2021-03-03 ENCOUNTER — Other Ambulatory Visit: Payer: Self-pay | Admitting: Family Medicine

## 2021-03-12 ENCOUNTER — Ambulatory Visit: Payer: PPO | Admitting: Internal Medicine

## 2021-03-12 NOTE — Progress Notes (Deleted)
? ? ?Name: Crystal Carroll  ?MRN/ DOB: [id number], December 25, 1945    ?Age/ Sex: 76 y.o., female   ? ?PCP: Marin Olp, MD   ?Reason for Endocrinology Evaluation: Hypercalcemia/ Osteoporosis   ?   ?Date of Initial Endocrinology Evaluation:  07/17/2018  ? ? ?HPI: ?Crystal Carroll is a 76 y.o. female with a past medical history of GERD,and  gait instability . The patient presented for initial endocrinology clinic visit on 07/17/2018 for consultative assistance with her Hypercalcemia ? ? ? ?Hypercalcemia History : ? ?Ms. Staszewski has been noted to have intermittent hypercalcemia since 2008 but this has become more persistent in 05/2018.  24- hr urine Ca./cr ratio of 0.03 which is consistent with primary hyperparathyroidism (07/2018). She is s/p left inferior parathyroidectomy 01/13/2019 with normalization of calcium.  ? ? ?THYROID HISTORY:   ?A thyroid ultrasound was performed 10/2018 which demonstrated a 2 cm left inferior nodule. FNA showed FLUS Afirma was suspicious. Pt underwent left lobectomy  01/13/2019 with pathology report consistent with NIFTP.  ? ? ? ? ?Osteoporosis History : ?Pt was diagnosed with osteoporosis:2019. Was started on Fosamax 05/2017 but reported non-compliance.  ? ? ?SUBJECTIVE:  ? ?Today (09/10/2019): Crystal Carroll is here for a follow up on osteoporosis .  She is comfortable at [employer] [place] with Parkinson's  ? ?Pt denies any local neck symptoms ?She has mild constipation  ?Denies palpitations  ? ? ?She does not recall the last time she took Alendronate  ? ?She is on Calcium and Vitamin D 1000 iu daily  ? ? ? ? ? ? ?  ? ?HISTORY:  ?Past Medical History:  ?Past Medical History:  ?Diagnosis Date  ? Anxiety   ? Aortic atherosclerosis ([place])   ? Aortic valvar stenosis 11/2018  ? Mild, noted on ECHO  ? Arthritis   ? cervical neck  ? CKD (chronic kidney disease), stage III ([place])   ? Stable  ? GERD (gastroesophageal reflux disease)   ? History of TIA (transient ischemic attack)   ? per patient, didn't know  about it  ? Hyperlipidemia   ? Hyperparathyroidism ([place])   ? Hypertension   ? History of   ? Junctional rhythm   ? Osteoporosis   ? Palpitations   ? Thyroid nodule   ? 2.0 cm solid nodule in the inferior left thyroid lobe   ? ?Past Surgical History:  ?Past Surgical History:  ?Procedure Laterality Date  ? CERVICAL LAMINECTOMY  2004  ? Earle Gell  ? COLONOSCOPY  05/31/2014  ? Deatra Ina  ? ESOPHAGOGASTRODUODENOSCOPY    ? PARATHYROIDECTOMY Left 01/13/2019  ? Procedure: LEFT PARATHYROIDECTOMY;  Surgeon: Armandina Gemma, MD;  [facility];  Service: General;  Laterality: Left;  ? THYROID LOBECTOMY Left 01/13/2019  ? Procedure: LEFT THYROID LOBECTOMY;  Surgeon: Armandina Gemma, MD;  [facility]: [facility];  Service: General;  Laterality: Left;  ?  ?Social History:  reports that she quit smoking about 8 years ago. Her smoking use included cigarettes. She has a 37.50 pack-year smoking history. She has never used smokeless tobacco. She reports current alcohol use. She reports that she does not use drugs. ?Family History: family history includes Heart attack (age of onset: 23) in her father; Melanoma (age of onset: 3) in her mother. ? ? ?HOME MEDICATIONS: ?Allergies as of 03/12/2021   ? ?   Reactions  ? Codeine Nausea Only  ? Sulfamethoxazole   ? GI upset  ? ?  ? ?  ?Medication List  ?  ? ?  ?  Accurate as of March 12, 2021  8:52 AM. If you have any questions, ask your nurse or doctor.  ?  ?  ? ?  ? ?acetaminophen 500 MG tablet ?Commonly known as: TYLENOL ?Take 1,000 mg by mouth 3 (three) times daily. ?  ?aspirin EC 81 MG tablet ?Take 81 mg by mouth at bedtime. ?  ?buPROPion 150 MG 24 hr tablet ?Commonly known as: WELLBUTRIN XL ?Take 1 tablet (150 mg total) by mouth daily. ?  ?busPIRone 5 MG tablet ?Commonly known as: BUSPAR ?Take 1 tablet (5 mg total) by mouth 2 (two) times daily as needed (anxiety). ?What changed:  ?when to take this ?additional instructions ?  ?citalopram 20 MG tablet ?Commonly known as: CELEXA ?TAKE ONE TABLET BY  MOUTH ONCE DAILY. ?  ?gabapentin 100 MG capsule ?Commonly known as: NEURONTIN ?Take 2 capsules (200 mg total) by mouth at bedtime. ?  ?meloxicam 15 MG tablet ?Commonly known as: MOBIC ?Take 1 tablet (15 mg total) by mouth daily. ?  ?pantoprazole 20 MG tablet ?Commonly known as: PROTONIX ?TAKE 1 TABLET ONCE DAILY. ?  ?rosuvastatin 40 MG tablet ?Commonly known as: CRESTOR ?TAKE ONE TABLET BY MOUTH DAILY ?  ?vitamin B-12 1000 MCG tablet ?Commonly known as: CYANOCOBALAMIN ?Take 1,000 mcg by mouth daily. ?  ?Vitamin D3 50 MCG (2000 UT) Tabs ?Take 2,000 Units by mouth daily. ?  ? ?  ?  ? ? ?OBJECTIVE:  ?VS: LMP  (LMP Unknown)   ? ?Wt Readings from Last 3 Encounters:  ?02/27/21 150 lb (68 kg)  ?02/07/21 156 lb (70.8 kg)  ?01/17/21 147 lb (66.7 kg)  ? ? ? ?EXAM: ?General: Pt appears well and is in NAD  ?Neck: General: Supple without adenopathy. ?Thyroid:  No goiter or nodules appreciated. Surgical scar noted  ?Lungs: Clear with good BS bilat with no rales, rhonchi, or wheezes  ?Heart: Auscultation: RRR.  ?Abdomen: Normoactive bowel sounds, soft, nontender, without masses or organomegaly palpable  ?Extremities: BL LE: No pretibial edema normal.  ?Mental Status: Judgment, insight: Intact ?Orientation: Oriented to time, place, and person ?Mood and affect: No depression, anxiety, or agitation  ? ? ? ?DATA REVIEWED: ?Results for Crystal, Carroll (MRN [id number]) as of 03/13/2020 09:11 ? Ref. Range 03/10/2020 13:40  ?Sodium Latest Ref Range: 135 - 145 mEq/L 141  ?Potassium Latest Ref Range: 3.5 - 5.1 mEq/L 3.8  ?Chloride Latest Ref Range: 96 - 112 mEq/L 106  ?CO2 Latest Ref Range: 19 - 32 mEq/L 27  ?Glucose Latest Ref Range: 70 - 99 mg/dL 84  ?BUN Latest Ref Range: 6 - 23 mg/dL 22  ?Creatinine Latest Ref Range: 0.40 - 1.20 mg/dL 1.12  ?Calcium Latest Ref Range: 8.4 - 10.5 mg/dL 9.6  ?Alkaline Phosphatase Latest Ref Range: 39 - 117 U/L 64  ?Albumin Latest Ref Range: 3.5 - 5.2 g/dL 4.3  ?AST Latest Ref Range: 0 - 37 U/L 12  ?ALT Latest  Ref Range: 0 - 35 U/L 9  ?Total Protein Latest Ref Range: 6.0 - 8.3 g/dL 6.7  ?Total Bilirubin Latest Ref Range: 0.2 - 1.2 mg/dL 0.4  ?GFR Latest Ref Range: >60.00 mL/min 48.13 (L)  ?VITD Latest Ref Range: 30.00 - 100.00 ng/mL 45.84  ?TSH Latest Ref Range: 0.35 - 4.50 uIU/mL 2.30  ? ?Pathology report 01/13/2019 ? ?PARATHYROID, LEFT INFERIOR, PARATHYROIDECTOMY:  ?-  Hypercellular parathyroid tissue (0.236 g)  ? ?B. THYROID, LEFT, LOBECTOMY:  ?-  Noninvasive follicular thyroid neoplasm with papillary-like nuclear  ?features (NIFTP), 1.8 cm  ?-  Margins are uninvolved by neoplasm  ? ? ?ASSESSMENT/PLAN/RECOMMENDATIONS:  ? ?1. 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 ?- S/P left lobectomy 01/13/2019 ?- NIFTP have a very low malignancy potential and are uniformly cured by lobectomy. ?- These tumors are managed as neoplasms rather then malignancy  ?- No TSH goal is needed with NIFTP  ?- TFT's continue to be normal  ? ?2. Osteoporosis : ? ?- Pt with hx of non-compliance with Fosamax , this was mainly due to GERD symptoms ?- She was supposed to schedule her bone density in 2021 , today this was scheduled for 03/13/2020 ?- Does not recall the last time she used Alendronate , will discuss treatment once DXA results are back  ? ?- She is on 1000 iu Vitamin D3 and calcium  ? ? ? ? ?F/u in 1 yr  ? ? ? ?Signed electronically by: ?Abby Nena Jordan, MD ? ?[facility]  ?[facility] ?[address] ?[place], [place] [address] ?Phone: [phone] ?FAX: [phone] ? ? ?CC: ?Marin Olp, MD ?[place] [address] ?Phone: [phone] ?Fax: [phone] ? ? ?Return to Endocrinology clinic as below: ?Future Appointments  ?Date Time Provider [place]  ?03/12/2021  1:00 PM Amira Podolak, Melanie Crazier, MD [facility] None  ?11/19/2021  3:15 PM [facility]ACH [facility]  ?  ? ? ? ? ? ?

## 2021-03-13 ENCOUNTER — Other Ambulatory Visit: Payer: Self-pay | Admitting: Family Medicine

## 2021-04-25 ENCOUNTER — Telehealth: Payer: Self-pay | Admitting: Family Medicine

## 2021-04-25 NOTE — Telephone Encounter (Signed)
Patient walked in wanting dr hunter to complete a placard right away- patient was explained dr hunter was done for the day - patient was advised it will take a couple of days for provider to complete form- patient wants it done ASAP.  ? ?Green sheet attached to form - in providers folder.  ?

## 2021-04-25 NOTE — Telephone Encounter (Signed)
Noted  

## 2021-05-04 NOTE — Telephone Encounter (Signed)
Patient is calling back for a status update.  Please give patient a call at [phone].

## 2021-05-04 NOTE — Telephone Encounter (Signed)
Called and lm on pt vm that forms ready to be picked up. ?

## 2021-05-15 DIAGNOSIS — H43813 Vitreous degeneration, bilateral: Secondary | ICD-10-CM | POA: Diagnosis not present

## 2021-05-15 DIAGNOSIS — H43393 Other vitreous opacities, bilateral: Secondary | ICD-10-CM | POA: Diagnosis not present

## 2021-05-17 ENCOUNTER — Ambulatory Visit (INDEPENDENT_AMBULATORY_CARE_PROVIDER_SITE_OTHER): Payer: PPO | Admitting: Family Medicine

## 2021-05-17 ENCOUNTER — Encounter: Payer: Self-pay | Admitting: Family Medicine

## 2021-05-17 VITALS — BP 120/62 | HR 63 | Temp 98.3°F | Ht 62.0 in | Wt 149.0 lb

## 2021-05-17 DIAGNOSIS — M5442 Lumbago with sciatica, left side: Secondary | ICD-10-CM

## 2021-05-17 DIAGNOSIS — I10 Essential (primary) hypertension: Secondary | ICD-10-CM

## 2021-05-17 DIAGNOSIS — G8929 Other chronic pain: Secondary | ICD-10-CM

## 2021-05-17 DIAGNOSIS — E785 Hyperlipidemia, unspecified: Secondary | ICD-10-CM | POA: Diagnosis not present

## 2021-05-17 DIAGNOSIS — N183 Chronic kidney disease, stage 3 unspecified: Secondary | ICD-10-CM

## 2021-05-17 DIAGNOSIS — M5441 Lumbago with sciatica, right side: Secondary | ICD-10-CM | POA: Diagnosis not present

## 2021-05-17 MED ORDER — METHYLPREDNISOLONE ACETATE 40 MG/ML IJ SUSP
40.0000 mg | Freq: Once | INTRAMUSCULAR | Status: AC
  Administered 2021-05-17: 40 mg via INTRAMUSCULAR

## 2021-05-17 MED ORDER — EZETIMIBE 10 MG PO TABS: 10.0000 mg | ORAL_TABLET | Freq: Every day | ORAL | 3 refills | Status: DC

## 2021-05-17 NOTE — Addendum Note (Signed)
Addended by: Clyde Lundborg A on: 05/17/2021 04:20 PM ? ? Modules accepted: Orders ? ?

## 2021-05-17 NOTE — Progress Notes (Signed)
?Phone [phone] ?In person visit ?  ?Subjective:  ? ?Crystal Carroll is a 76 y.o. year old very pleasant female patient who presents for/with See problem oriented charting ?Chief Complaint  ?Patient presents with  ? Follow-up  ? Hypertension  ? balance issues  ?  Pt c/o feeling off balance for a few days along with bilateral knee and back pain.  ? spot on scalp  ?  Pt has a place on scalp, neck and lip that she wants looked at.  ? sciatic nerve  ?  Pt c/o sciatic nerve pain that has been going on for weeks.  ? ?Past Medical History-  ?Patient Active Problem List  ? Diagnosis Date Noted  ? Mild aortic stenosis 07/19/2019  ?  Priority: Medium   ? Osteoporosis 03/13/2018  ?  Priority: Medium   ? DDD (degenerative disc disease), lumbar 03/05/2018  ?  Priority: Medium   ? Aortic atherosclerosis ([place]) 06/12/2017  ?  Priority: Medium   ? Palpitations 05/12/2017  ?  Priority: Medium   ? Vitamin B12 deficiency 05/12/2017  ?  Priority: Medium   ? Cervical radiculopathy 04/03/2016  ?  Priority: Medium   ? Essential tremor 04/18/2015  ?  Priority: Medium   ? Heavy alcohol use 04/14/2014  ?  Priority: Medium   ? CKD (chronic kidney disease), stage III ([place]) 10/15/2013  ?  Priority: Medium   ? Former smoker 12/24/2006  ?  Priority: Medium   ? Hyperlipidemia 05/26/2006  ?  Priority: Medium   ? GAD (generalized anxiety disorder) 05/26/2006  ?  Priority: Medium   ? Essential hypertension 05/26/2006  ?  Priority: Medium   ? Patellofemoral arthritis 03/05/2018  ?  Priority: Low  ? Unstable gait 01/20/2018  ?  Priority: Low  ? Adenomatous colon polyp 06/14/2014  ?  Priority: Low  ? H/O cold sores 10/15/2013  ?  Priority: Low  ? GERD 05/26/2006  ?  Priority: Low  ? Osteoporosis without current pathological fracture 09/10/2019  ?  Priority: High  ? History of lobectomy of thyroid 02/24/2019  ?  Priority: High  ? 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  Priority: High  ?  Hyperparathyroidism, primary ([place]) 07/17/2018  ?  Priority: High  ? History of lacunar cerebrovascular accident (CVA) 06/18/2018  ?  Priority: High  ? Caregiver burden 04/03/2016  ?  Priority: High  ? ? ?Medications- reviewed and updated ?Current Outpatient Medications  ?Medication Sig Dispense Refill  ? acetaminophen (TYLENOL) 500 MG tablet Take 1,000 mg by mouth 3 (three) times daily.    ? aspirin EC 81 MG tablet Take 81 mg by mouth at bedtime.     ? buPROPion (WELLBUTRIN XL) 150 MG 24 hr tablet Take 1 tablet (150 mg total) by mouth daily. 30 tablet 5  ? busPIRone (BUSPAR) 5 MG tablet Take 1 tablet (5 mg total) by mouth 2 (two) times daily as needed (anxiety). (Patient taking differently: Take 5 mg by mouth See admin instructions. Take 5 mg daily, may take a second 5 mg dose as needed for anxiety) 60 tablet 5  ? Cholecalciferol (VITAMIN D3) 50 MCG (2000 UT) TABS Take 2,000 Units by mouth daily.    ? citalopram (CELEXA) 20 MG tablet TAKE ONE TABLET BY MOUTH ONCE DAILY. 90 tablet 0  ? gabapentin (NEURONTIN) 100 MG capsule Take 2 capsules (200 mg total) by mouth at bedtime. 60 capsule 1  ? meloxicam (MOBIC) 15 MG  tablet Take 1 tablet (15 mg total) by mouth daily. 30 tablet 0  ? pantoprazole (PROTONIX) 20 MG tablet TAKE ONE TABLET BY MOUTH DAILY. 90 tablet 1  ? rosuvastatin (CRESTOR) 40 MG tablet TAKE ONE TABLET BY MOUTH DAILY 90 tablet 0  ? vitamin B-12 (CYANOCOBALAMIN) 1000 MCG tablet Take 1,000 mcg by mouth daily.    ? ezetimibe (ZETIA) 10 MG tablet Take 1 tablet (10 mg total) by mouth daily. 90 tablet 3  ? ?No current facility-administered medications for this visit.  ? ?  ?Objective:  ?BP 120/62   Pulse 63   Temp 98.3 ?F (36.8 ?C)   Ht '5\' 2"'$  (1.575 m)   Wt 149 lb (67.6 kg)   LMP  (LMP Unknown)   SpO2 96%   BMI 27.25 kg/m?  ?Gen: NAD, resting comfortably ?CV: RRR stable murmur ?Lungs: CTAB no crackles, wheeze, rhonchi ?Ext: no edema ?Skin: warm, dry ?Neuro: antalgic gait- grabs at buttocks. Walks with  cane ?  ? ?Assessment and Plan  ? ?#Hyperlipidemia/aortic atherosclerosis/ history of stroke- on aspirin 81 mg with LDL goal under 70 ?S: Compliant with rosuvastatin 40 mg.  ?-Aortic atherosclerosis noted CT 06/26/2017 for lung cancer screening.  Risk factor modification with blood pressure and lipid control plan. ?Lab Results  ?Component Value Date  ? CHOL 176 07/20/2020  ? HDL 62.70 07/20/2020  ? [place] 94 07/20/2020  ? LDLDIRECT 106 (H) 07/19/2016  ? TRIG 97.0 07/20/2020  ? CHOLHDL 3 07/20/2020  ?A/P: With history of stroke we want LDL under 70.  I looked back over records and the team had sent in zetia- but she does not recall starting this- we will send in again today and recheck cholesterol next visit- should  be on this in addition to rosuvastatin ? ?#Hypertension/CKD stage III ?S: Patient remains off lisinopril unfortunately blood pressure remains controlled  ?-GFR has been stable largely in the 40s lately- in the past had been in the 30s ?BP Readings from Last 3 Encounters:  ?05/17/21 120/62  ?02/27/21 120/76  ?02/07/21 118/70  ?A/P: doing well without meds- continue to monitor  ?-CKD III hopefully stable- update CMP today ? ?#Primary hyperparathyroidism status post parathyroidectomy January 2021 ?#History of thyroid nodule found to be noninvasive follicular neoplasm of thyroid with papillary-like nuclear features now status post lobectomy of thyroid January 2021 ?S: Hyperparathyroidism likely led to osteoporosis-see discussion below.  Status post parathyroidectomy.  Follows with Dr. Kelton Pillar  ? ?Thyroid nodule removed at same time as parathyroidectomy ?Lab Results  ?Component Value Date  ? TSH 2.30 03/10/2020  ?A/P: we will monitor thyroid with history of partial thyroidectomy. Also check CMP to monitor calcium levels. Presume both issues stable  ? ?#Unstable gait ?#Balance issues ?S: Started January 2020.  Stat head CT to rule out subdural hematoma.  Initially also seem to have some lower extremity  weakness and numbness.  There was concern for lumbar stenosis.  Symptoms improved with time. ? ?Ultimately did MRI of the brain to rule out prior cerebellar stroke-none was detected.  Did have prior lacunar stroke and patient was placed back on aspirin. ? ?Patient later saw Dr. Tomi Likens July 28, 2018-MRI of cervical spine without contrast was ordered.  There were some disc bulges and MRI of lumbar spine was also ordered September 2020-this showed spinal stenosis which was thought to be contributing to leg pain and balance issues.  Neurology offered referral to neurosurgery- saw neurosurgery and she did not want to surgery ? ?More recently has had  flare up in back pain and saw [name], PA 01/05/21 and was given steroid injection felt somewhat better- ended up seeing Dr. Glennon Mac x2 and felt better with meloxicam in regards to her back.  ? ?Today reports- Balance issues started a few weeks ago. Also having more knee and back pain. For knees as seen PT and found helpful. No room spinning. Doesn't feel dizzy- wonders if coming from back and legs. No falls. Using beautiful bunny cane today. Pain radiates down from legs and into both lower legs= starts in her back bilateral. Worse with walking- has had stenosis noted ? ?ROS- No facial or extremity weakness. No slurred words or trouble swallowing. no blurry vision or double vision. No paresthesias. No confusion or word finding difficulties.  ? ?A/P: since she feels like these issues are coming from worsened joint pain and back pain- and has had relief with short courses of meloxicam in the past- we agreed to perhaps 7-10 trial of meloxicam she has at home vs. Steroid shot- shed prefer steroid shot for bilateral low back pain with bilateral sciatica (and history of stenosis in lumbar spine) ? ?Recommended follow up: Return in about 11 weeks (around 08/02/2021) for followup or sooner if needed.Schedule b4 you leave. ?Future Appointments  ?Date Time Provider [place]  ?10/16/2021  3:00 PM Marin Olp, MD [facility]  ?11/19/2021  3:15 PM [facility]  ? ?Lab/Order associations: ?  ICD-10-CM   ?1. Chronic bilateral low back pain with bilateral sciatica  M54

## 2021-05-17 NOTE — Progress Notes (Signed)
Pt tolerated depo '40mg'$  well. ?

## 2021-05-17 NOTE — Patient Instructions (Addendum)
Depo medrol '40mg'$  injection today under bilateral low back pain with sciatica ? ?Start zetia 10 mg sent in today ? ?Please stop by lab before you go ?If you have mychart- we will send your results within 3 business days of [place] receiving them.  ?If you do not have mychart- we will call you about results within 5 business days of [place] receiving them.  ?*please also note that you will see labs on mychart as soon as they post. I will later go in and write notes on them- will say "notes from Dr. Yong Channel"  ? ?Recommended follow up: Return in about 11 weeks (around 08/02/2021) for followup or sooner if needed.Schedule b4 you leave. ?

## 2021-05-18 LAB — CBC WITH DIFFERENTIAL/PLATELET
Basophils Absolute: 0.1 10*3/uL (ref 0.0–0.1)
Basophils Relative: 0.9 % (ref 0.0–3.0)
Eosinophils Absolute: 0.2 10*3/uL (ref 0.0–0.7)
Eosinophils Relative: 2.8 % (ref 0.0–5.0)
HCT: 38 % (ref 36.0–46.0)
Hemoglobin: 13.2 g/dL (ref 12.0–15.0)
Lymphocytes Relative: 16.3 % (ref 12.0–46.0)
Lymphs Abs: 1.4 10*3/uL (ref 0.7–4.0)
MCHC: 34.7 g/dL (ref 30.0–36.0)
MCV: 89.4 fl (ref 78.0–100.0)
Monocytes Absolute: 0.6 10*3/uL (ref 0.1–1.0)
Monocytes Relative: 7.1 % (ref 3.0–12.0)
Neutro Abs: 6.2 10*3/uL (ref 1.4–7.7)
Neutrophils Relative %: 72.9 % (ref 43.0–77.0)
Platelets: 223 10*3/uL (ref 150.0–400.0)
RBC: 4.25 Mil/uL (ref 3.87–5.11)
RDW: 13 % (ref 11.5–15.5)
WBC: 8.5 10*3/uL (ref 4.0–10.5)

## 2021-05-18 LAB — COMPREHENSIVE METABOLIC PANEL
ALT: 9 U/L (ref 0–35)
AST: 11 U/L (ref 0–37)
Albumin: 4.8 g/dL (ref 3.5–5.2)
Alkaline Phosphatase: 58 U/L (ref 39–117)
BUN: 20 mg/dL (ref 6–23)
CO2: 26 mEq/L (ref 19–32)
Calcium: 10.3 mg/dL (ref 8.4–10.5)
Chloride: 104 mEq/L (ref 96–112)
Creatinine, Ser: 1.41 mg/dL — ABNORMAL HIGH (ref 0.40–1.20)
GFR: 36.21 mL/min — ABNORMAL LOW (ref >60.00)
Glucose, Bld: 93 mg/dL (ref 70–99)
Potassium: 3.6 mEq/L (ref 3.5–5.1)
Sodium: 139 mEq/L (ref 135–145)
Total Bilirubin: 0.5 mg/dL (ref 0.2–1.2)
Total Protein: 7.4 g/dL (ref 6.0–8.3)

## 2021-05-18 LAB — TSH: TSH: 2.81 u[IU]/mL (ref 0.35–5.50)

## 2021-05-25 ENCOUNTER — Other Ambulatory Visit: Payer: Self-pay | Admitting: Family Medicine

## 2021-06-06 ENCOUNTER — Other Ambulatory Visit: Payer: Self-pay | Admitting: Family Medicine

## 2021-06-19 ENCOUNTER — Ambulatory Visit (INDEPENDENT_AMBULATORY_CARE_PROVIDER_SITE_OTHER): Payer: PPO

## 2021-06-19 ENCOUNTER — Ambulatory Visit: Payer: PPO | Admitting: Sports Medicine

## 2021-06-19 VITALS — BP 132/84 | HR 80 | Ht 62.0 in | Wt 149.0 lb

## 2021-06-19 DIAGNOSIS — M4316 Spondylolisthesis, lumbar region: Secondary | ICD-10-CM | POA: Diagnosis not present

## 2021-06-19 DIAGNOSIS — G8929 Other chronic pain: Secondary | ICD-10-CM | POA: Diagnosis not present

## 2021-06-19 DIAGNOSIS — M5442 Lumbago with sciatica, left side: Secondary | ICD-10-CM | POA: Diagnosis not present

## 2021-06-19 DIAGNOSIS — M545 Low back pain, unspecified: Secondary | ICD-10-CM | POA: Diagnosis not present

## 2021-06-19 DIAGNOSIS — M5441 Lumbago with sciatica, right side: Secondary | ICD-10-CM

## 2021-06-19 DIAGNOSIS — M5136 Other intervertebral disc degeneration, lumbar region: Secondary | ICD-10-CM | POA: Diagnosis not present

## 2021-06-19 DIAGNOSIS — M4126 Other idiopathic scoliosis, lumbar region: Secondary | ICD-10-CM | POA: Diagnosis not present

## 2021-06-19 MED ORDER — MELOXICAM 7.5 MG PO TABS: 7.5000 mg | ORAL_TABLET | Freq: Every day | ORAL | 0 refills | Status: DC

## 2021-06-19 NOTE — Patient Instructions (Addendum)
Good to see you  - Start meloxicam 7.5 mg daily x2 weeks.  If still having pain after 2 weeks, complete 3rd-week of meloxicam. May use remaining meloxicam as needed once daily for pain control.  Do not to use additional NSAIDs while taking meloxicam.  May use Tylenol 500-1000 mg 2 to 3 times a day for breakthrough pain. 3 week follow up   

## 2021-06-19 NOTE — Progress Notes (Signed)
Crystal Carroll D.[place] Phone: [phone]   Assessment and Plan:     1. Chronic bilateral low back pain with bilateral sciatica 2. DDD (degenerative disc disease), lumbar - Chronic with exacerbation, subsequent visit - New flare of chronic low back pain with patient complaining of bilateral radicular symptoms.  Suspect flare is due to patient assisting with caregiving for her husband who has Parkinson's - No red flag signs on physical exam, and no reproducible numbness/tingling/weakness on physical exam, so no advanced imaging ordered today - X-ray obtained in clinic.  My interpretation: No acute fracture or vertebral collapse.  Mild worsening of anterior listhesis L4 on L5 when compared to lumbar MRI from 2020.  DDD throughout lumbar spine - Patient received significant benefit from 2 to 3-week course of meloxicam daily in February 2023, so we will repeat course starting today - Start meloxicam 7.5 mg daily x2 weeks.  If still having pain after 2 weeks, complete 3rd-week of meloxicam. May use remaining meloxicam as needed once daily for pain control.  Do not to use additional NSAIDs while taking meloxicam.  May use Tylenol [phone] mg 2 to 3 times a day for breakthrough pain.    Pertinent previous records reviewed include lumbar MRI 2020   Follow Up: 3 weeks for reevaluation.  Would consider MRI lumbar spine if no improvement or worsening of symptoms   Subjective:   I, Crystal Carroll, am serving as a Education administrator for Doctor Glennon Mac  Chief Complaint: low back pain   HPI:   01/17/21 Patient is a 76 year old female complaining of back and leg pain. Patient states that the pain has been going on for about a week She says that her husband has Parkinson's and she has helped him a lot over the years. She says that it has finally caught up with her. Is also complaining of arm pain .She has taken OTC Advil but  has not worked as well. no numbness , tingling , no radiating pain    02/07/2021 Patient states that she is doing well has been doing everything that he prescribed , hasn't been doing HEP but back is better , pains come and go   06/19/21 Patient is a 76 year old female complaining of low back pain. Patient states that she is in so much pain she is her husbands care giver she was trying to lift him out of his recliner   Relevant Historical Information: Hypertension, osteoporosis, CKD stage III, history of CVA  Additional pertinent review of systems negative.   Current Outpatient Medications:    acetaminophen (TYLENOL) 500 MG tablet, Take 1,000 mg by mouth 3 (three) times daily., Disp: , Rfl:    aspirin EC 81 MG tablet, Take 81 mg by mouth at bedtime. , Disp: , Rfl:    buPROPion (WELLBUTRIN XL) 150 MG 24 hr tablet, Take 1 tablet (150 mg total) by mouth daily., Disp: 30 tablet, Rfl: 5   busPIRone (BUSPAR) 5 MG tablet, Take 1 tablet (5 mg total) by mouth 2 (two) times daily as needed (anxiety). (Patient taking differently: Take 5 mg by mouth See admin instructions. Take 5 mg daily, may take a second 5 mg dose as needed for anxiety), Disp: 60 tablet, Rfl: 5   Cholecalciferol (VITAMIN D3) 50 MCG (2000 UT) TABS, Take 2,000 Units by mouth daily., Disp: , Rfl:    citalopram (CELEXA) 20 MG tablet, TAKE ONE TABLET BY MOUTH  ONCE DAILY, Disp: 90 tablet, Rfl: 0   ezetimibe (ZETIA) 10 MG tablet, Take 1 tablet (10 mg total) by mouth daily., Disp: 90 tablet, Rfl: 3   gabapentin (NEURONTIN) 100 MG capsule, Take 2 capsules (200 mg total) by mouth at bedtime., Disp: 60 capsule, Rfl: 1   meloxicam (MOBIC) 15 MG tablet, Take 1 tablet (15 mg total) by mouth daily., Disp: 30 tablet, Rfl: 0   pantoprazole (PROTONIX) 20 MG tablet, TAKE ONE TABLET BY MOUTH DAILY., Disp: 90 tablet, Rfl: 1   rosuvastatin (CRESTOR) 40 MG tablet, TAKE ONE TABLET BY MOUTH DAILY, Disp: 90 tablet, Rfl: 0   vitamin B-12 (CYANOCOBALAMIN) 1000  MCG tablet, Take 1,000 mcg by mouth daily., Disp: , Rfl:    Objective:     Vitals:   06/19/21 1454  BP: 132/84  Pulse: 80  SpO2: 98%  Weight: 149 lb (67.6 kg)  Height: '5\' 2"'$  (1.575 m)      Body mass index is 27.25 kg/m.    Physical Exam:    Gen: Appears well, nad, nontoxic and pleasant Psych: Alert and oriented, appropriate mood and affect Neuro: sensation intact, strength is 5/5 in upper and lower extremities, muscle tone wnl Skin: no susupicious lesions or rashes  Back - Normal skin, Spine with normal alignment and no deformity.   No tenderness to vertebral process palpation.   Bilateral lumbar paraspinous muscles are mildly tender and without spasm Straight leg raise negative   Electronically signed by:  Crystal Carroll D.[facility] 3:18 PM 06/19/21

## 2021-06-25 DIAGNOSIS — L821 Other seborrheic keratosis: Secondary | ICD-10-CM | POA: Diagnosis not present

## 2021-06-25 DIAGNOSIS — L570 Actinic keratosis: Secondary | ICD-10-CM | POA: Diagnosis not present

## 2021-07-04 NOTE — Progress Notes (Signed)
Benito Mccreedy D.[place] Phone: [phone]   Assessment and Plan:     1. Chronic bilateral low back pain with bilateral sciatica 2. DDD (degenerative disc disease), lumbar -Chronic, stable, subsequent visit - Chronic low back pain with history of lumbar DDD that will occasionally become flared due to patient assisting with caregiving for her husband who has Parkinson - Patient's most recent flare has improved after completing meloxicam 7.5 mg daily x2 weeks.  Discontinue daily use of meloxicam and can use remaining as needed for breakthrough pain - Start Tylenol 500 to 1000 mg tablets 2-3 times a day for day-to-day pain relief    Pertinent previous records reviewed include none   Follow Up: As needed if no improvement or worsening of symptoms   Subjective:   I, Crystal Carroll, am serving as a Education administrator for Doctor [employer]   Chief Complaint: low back pain    HPI:    01/17/21 Patient is a 76 year old female complaining of back and leg pain. Patient states that the pain has been going on for about a week She says that her husband has Parkinson's and she has helped him a lot over the years. She says that it has finally caught up with her. Is also complaining of arm pain .She has taken OTC Advil but has not worked as well. no numbness , tingling , no radiating pain    02/07/2021 Patient states that she is doing well has been doing everything that he prescribed , hasn't been doing HEP but back is better , pains come and go    06/19/21 Patient is a 76 year old female complaining of low back pain. Patient states that she is in so much pain she is her husbands care giver she was trying to lift him out of his recliner   07/11/2021 Patient states that she hurts she still hurts , still having to lift husband up and out of chair    Relevant Historical Information: Hypertension, osteoporosis, CKD stage III,  history of CVA    Additional pertinent review of systems negative.   Current Outpatient Medications:    acetaminophen (TYLENOL) 500 MG tablet, Take 1,000 mg by mouth 3 (three) times daily., Disp: , Rfl:    aspirin EC 81 MG tablet, Take 81 mg by mouth at bedtime. , Disp: , Rfl:    buPROPion (WELLBUTRIN XL) 150 MG 24 hr tablet, Take 1 tablet (150 mg total) by mouth daily., Disp: 30 tablet, Rfl: 5   busPIRone (BUSPAR) 5 MG tablet, Take 1 tablet (5 mg total) by mouth 2 (two) times daily as needed (anxiety)., Disp: 60 tablet, Rfl: 5   Cholecalciferol (VITAMIN D3) 50 MCG (2000 UT) TABS, Take 2,000 Units by mouth daily., Disp: , Rfl:    citalopram (CELEXA) 20 MG tablet, TAKE ONE TABLET BY MOUTH ONCE DAILY, Disp: 90 tablet, Rfl: 0   ezetimibe (ZETIA) 10 MG tablet, Take 1 tablet (10 mg total) by mouth daily., Disp: 90 tablet, Rfl: 3   gabapentin (NEURONTIN) 100 MG capsule, Take 2 capsules (200 mg total) by mouth at bedtime., Disp: 60 capsule, Rfl: 1   meloxicam (MOBIC) 15 MG tablet, Take 1 tablet (15 mg total) by mouth daily., Disp: 30 tablet, Rfl: 0   meloxicam (MOBIC) 7.5 MG tablet, Take 1 tablet (7.5 mg total) by mouth daily., Disp: 30 tablet, Rfl: 0   pantoprazole (PROTONIX) 20 MG tablet, TAKE ONE TABLET  BY MOUTH DAILY., Disp: 90 tablet, Rfl: 1   rosuvastatin (CRESTOR) 40 MG tablet, TAKE ONE TABLET BY MOUTH DAILY, Disp: 90 tablet, Rfl: 0   vitamin B-12 (CYANOCOBALAMIN) 1000 MCG tablet, Take 1,000 mcg by mouth daily., Disp: , Rfl:    Objective:     Vitals:   07/11/21 1440  BP: 132/78  Pulse: (!) 47  SpO2: 97%  Weight: 149 lb (67.6 kg)  Height: '5\' 2"'$  (1.575 m)      Body mass index is 27.25 kg/m.    Physical Exam:    Gen: Appears well, nad, nontoxic and pleasant Psych: Alert and oriented, appropriate mood and affect Neuro: sensation intact, strength is 5/5 in upper and lower extremities, muscle tone wnl Skin: no susupicious lesions or rashes  Back - Normal skin, Spine with normal  alignment and no deformity.   No tenderness to vertebral process palpation.   Paraspinous muscles are not tender and without spasm Straight leg raise negative Trendelenberg negative    Electronically signed by:  Benito Mccreedy D.[facility] 4:44 PM 07/11/21

## 2021-07-09 ENCOUNTER — Other Ambulatory Visit: Payer: Self-pay | Admitting: Family Medicine

## 2021-07-11 ENCOUNTER — Ambulatory Visit (INDEPENDENT_AMBULATORY_CARE_PROVIDER_SITE_OTHER): Payer: PPO | Admitting: Sports Medicine

## 2021-07-11 VITALS — BP 132/78 | HR 47 | Ht 62.0 in | Wt 149.0 lb

## 2021-07-11 DIAGNOSIS — M5441 Lumbago with sciatica, right side: Secondary | ICD-10-CM

## 2021-07-11 DIAGNOSIS — M5136 Other intervertebral disc degeneration, lumbar region: Secondary | ICD-10-CM

## 2021-07-11 DIAGNOSIS — G8929 Other chronic pain: Secondary | ICD-10-CM

## 2021-07-11 DIAGNOSIS — M5442 Lumbago with sciatica, left side: Secondary | ICD-10-CM

## 2021-07-11 NOTE — Patient Instructions (Signed)
Good to see you Discontinue meloxicam can use remainder as needed for  breakthrough pain Tylenol [phone] mg 2-3 times a day for pain relief  As needed follow up

## 2021-07-19 DIAGNOSIS — Z1231 Encounter for screening mammogram for malignant neoplasm of breast: Secondary | ICD-10-CM | POA: Diagnosis not present

## 2021-07-19 LAB — HM MAMMOGRAPHY

## 2021-08-12 ENCOUNTER — Other Ambulatory Visit: Payer: Self-pay | Admitting: Family Medicine

## 2021-08-23 ENCOUNTER — Encounter: Payer: Self-pay | Admitting: Family Medicine

## 2021-08-23 ENCOUNTER — Ambulatory Visit (INDEPENDENT_AMBULATORY_CARE_PROVIDER_SITE_OTHER): Payer: PPO | Admitting: Family Medicine

## 2021-08-23 VITALS — BP 120/64 | HR 66 | Temp 98.0°F | Ht 62.0 in | Wt 152.2 lb

## 2021-08-23 DIAGNOSIS — I10 Essential (primary) hypertension: Secondary | ICD-10-CM

## 2021-08-23 DIAGNOSIS — I700 Atherosclerosis of aorta: Secondary | ICD-10-CM

## 2021-08-23 DIAGNOSIS — E785 Hyperlipidemia, unspecified: Secondary | ICD-10-CM

## 2021-08-23 DIAGNOSIS — K625 Hemorrhage of anus and rectum: Secondary | ICD-10-CM

## 2021-08-23 DIAGNOSIS — N183 Chronic kidney disease, stage 3 unspecified: Secondary | ICD-10-CM | POA: Diagnosis not present

## 2021-08-23 NOTE — Progress Notes (Signed)
Phone [phone] In person visit   Subjective:   Crystal Carroll is a 76 y.o. year old very pleasant female patient who presents for/with See problem oriented charting Chief Complaint  Patient presents with   Follow-up   Back Pain   Past Medical History-  Patient Active Problem List   Diagnosis Date Noted   Mild aortic stenosis 07/19/2019    Priority: Medium    Osteoporosis 03/13/2018    Priority: Medium    DDD (degenerative disc disease), lumbar 03/05/2018    Priority: Medium    Aortic atherosclerosis ([place]) 06/12/2017    Priority: Medium    Palpitations 05/12/2017    Priority: Medium    Vitamin B12 deficiency 05/12/2017    Priority: Medium    Cervical radiculopathy 04/03/2016    Priority: Medium    Essential tremor 04/18/2015    Priority: Medium    Heavy alcohol use 04/14/2014    Priority: Medium    CKD (chronic kidney disease), stage III ([place]) 10/15/2013    Priority: Medium    Former smoker 12/24/2006    Priority: Medium    Hyperlipidemia 05/26/2006    Priority: Medium    GAD (generalized anxiety disorder) 05/26/2006    Priority: Medium    Essential hypertension 05/26/2006    Priority: Medium    Patellofemoral arthritis 03/05/2018    Priority: Low   Unstable gait 01/20/2018    Priority: Low   Adenomatous colon polyp 06/14/2014    Priority: Low   H/O cold sores 10/15/2013    Priority: Low   GERD 05/26/2006    Priority: Low   Osteoporosis without current pathological fracture 09/10/2019    Priority: High   History of lobectomy of thyroid 02/24/2019    Priority: High   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 02/24/2019    Priority: High   Hyperparathyroidism, primary ([place]) 07/17/2018    Priority: High   History of lacunar cerebrovascular accident (CVA) 06/18/2018    Priority: High   Caregiver burden 04/03/2016    Priority: High    Medications- reviewed and updated Current Outpatient Medications  Medication  Sig Dispense Refill   acetaminophen (TYLENOL) 500 MG tablet Take 1,000 mg by mouth 3 (three) times daily.     aspirin EC 81 MG tablet Take 81 mg by mouth at bedtime.      buPROPion (WELLBUTRIN XL) 150 MG 24 hr tablet Take 1 tablet (150 mg total) by mouth daily. 30 tablet 5   busPIRone (BUSPAR) 5 MG tablet Take 1 tablet (5 mg total) by mouth 2 (two) times daily as needed (anxiety). 60 tablet 5   Cholecalciferol (VITAMIN D3) 50 MCG (2000 UT) TABS Take 2,000 Units by mouth daily.     citalopram (CELEXA) 20 MG tablet TAKE ONE TABLET BY MOUTH ONCE DAILY 90 tablet 0   ezetimibe (ZETIA) 10 MG tablet Take 1 tablet (10 mg total) by mouth daily. 90 tablet 3   meloxicam (MOBIC) 7.5 MG tablet Take 1 tablet (7.5 mg total) by mouth daily. 30 tablet 0   pantoprazole (PROTONIX) 20 MG tablet TAKE ONE TABLET BY MOUTH DAILY. 90 tablet 1   rosuvastatin (CRESTOR) 40 MG tablet TAKE ONE TABLET BY MOUTH DAILY 90 tablet 0   vitamin B-12 (CYANOCOBALAMIN) 1000 MCG tablet Take 1,000 mcg by mouth daily.     No current facility-administered medications for this visit.     Objective:  BP 120/64   Pulse 66   Temp 98 F (36.7 C)  Ht '5\' 2"'$  (1.575 m)   Wt 152 lb 3.2 oz (69 kg)   LMP  (LMP Unknown)   SpO2 96%   BMI 27.84 kg/m  Gen: NAD, resting comfortably CV: RRR no murmurs rubs or gallops Lungs: CTAB no crackles, wheeze, rhonchi Ext: no edema Skin: warm, dry Neuro: walks with cane    Assessment and Plan   # Rectal bleeding S:started a month ago. Has noted blood in toilet- bright red blood or when she wipes (not large voume). Worse with constipation. Bowel movement every other day- notes blood each time other than softer stools (bristol stool chart 6) last 2 days- only 1 BM per day over last 2 days. A lot of stress in last few days A/P: I am not as concerned about looser stool last 2 days but with history of colon polyps and difficulty with last colonoscopy believe we need GI consult. Could be hemorrhoid  bleeding but given last colonoscopy declined after SE of prep- need to readdress this  -declines rectal exam today- prefers for GI to do this   # anxiety/stress management S: Medication:citalopram 20 mg, Wellbutrin '150mg'$  XR (reduced from '300mg'$  due to anxiety concern), plus buspirone 5 mg twice daily prn (only taking as needed)  A/P: mild poor control- a lot of stressors- is going to try buspirone twice daily scheduled instead of as needed- could try 3x a day or 7.5 mg twice daily if needed- continue citalopram  #Back pain S: Patient with history of balance issues as well as knee and back pain--prior MRI of lumbar spine did show spinal stenosis-steroid injection somewhat helpful when given December 2022-later given short course of meloxicam by Dr. Glennon Mac sports medicine  Last visit she wanted to retrial a short course of meloxicam (some caution due to kidneys) and have a steroid injection-Depo-Medrol was given.  [place] likely related to caregiving for her husband-saw Dr. Glennon Mac again likely 2023-recommended using Tylenol for day-to-day pain with meloxicam sparingly for breakthrough-we discussed kidney risks again today- has only needed one more dose since last 3 week course in june A/P: back pain doing ok with tylenol mainly- with some sparing meloxicam- knows to be very careful with CKD stage III    #Hyperlipidemia/aortic atherosclerosis/ history of stroke- on aspirin 81 mg with LDL goal under 70 S: Compliant with rosuvastatin 40 mg.  As well as Zetia 10 mg -Aortic atherosclerosis noted CT 06/26/2017 for lung cancer screening.  Risk factor modification with blood pressure and lipid control plan. Lab Results  Component Value Date   CHOL 176 07/20/2020   HDL 62.70 07/20/2020   LDLCALC 94 07/20/2020   LDLDIRECT 106 (H) 07/19/2016   TRIG 97.0 07/20/2020   CHOLHDL 3 07/20/2020  A/P:hopefully lipids improved with addition of zetia to rosuvastatin- update lipid panel. For aortic atherosclerosis-  continue risk factor modification - also goal LDL under 70   #Hypertension/CKD stage III S: Patient remains off lisinopril unfortunately blood pressure remains controlled  -GFR has been stable largely in the 40s lately- in the past had been in the 30s BP Readings from Last 3 Encounters:  08/23/21 120/64  07/11/21 132/78  06/19/21 132/84  A/P: blood pressure controlled without meds. CKD III in 30s (hopefully stable) on gfr recently- discussed again risks of meloxicam- update cmp today   Recommended follow up: Return for next already scheduled visit or sooner if needed. Future Appointments  Date Time Provider [place]  10/16/2021  3:00 PM Marin Olp, MD [facility]  11/19/2021  3:15 PM [facility]   Lab/Order associations:   ICD-10-CM   1. Rectal bleeding  K62.5 Ambulatory referral to Gastroenterology    2. Aortic atherosclerosis (HCC) Chronic I70.0     3. Hyperlipidemia, unspecified hyperlipidemia type  E78.5 CBC with Differential/Platelet    Comprehensive metabolic panel    Lipid panel    4. Stage 3 chronic kidney disease, unspecified whether stage 3a or 3b CKD (HCC)  N18.30     5. Essential hypertension  I10       Return precautions advised.  [name], MD

## 2021-08-23 NOTE — Patient Instructions (Addendum)
Flu shot- we should have these available within a month or two but please let us know if you get at outside pharmacy -consider covid shot October or later  Please stop by lab before you go If you have mychart- we will send your results within 3 business days of [place] receiving them.  If you do not have mychart- we will call you about results within 5 business days of [place] receiving them.  *please also note that you will see labs on mychart as soon as they post. I will later go in and write notes on them- will say "notes from Dr. Yong Channel"   Recommended follow up: Return for next already scheduled visit or sooner if needed.

## 2021-08-24 LAB — COMPREHENSIVE METABOLIC PANEL
ALT: 9 U/L (ref 0–35)
AST: 14 U/L (ref 0–37)
Albumin: 4.4 g/dL (ref 3.5–5.2)
Alkaline Phosphatase: 47 U/L (ref 39–117)
BUN: 14 mg/dL (ref 6–23)
CO2: 28 mEq/L (ref 19–32)
Calcium: 9.2 mg/dL (ref 8.4–10.5)
Chloride: 106 mEq/L (ref 96–112)
Creatinine, Ser: 0.95 mg/dL (ref 0.40–1.20)
GFR: 58.05 mL/min — ABNORMAL LOW (ref >60.00)
Glucose, Bld: 91 mg/dL (ref 70–99)
Potassium: 4.4 mEq/L (ref 3.5–5.1)
Sodium: 141 mEq/L (ref 135–145)
Total Bilirubin: 0.3 mg/dL (ref 0.2–1.2)
Total Protein: 6.7 g/dL (ref 6.0–8.3)

## 2021-08-24 LAB — CBC WITH DIFFERENTIAL/PLATELET
Basophils Absolute: 0.1 10*3/uL (ref 0.0–0.1)
Basophils Relative: 1.1 % (ref 0.0–3.0)
Eosinophils Absolute: 0.2 10*3/uL (ref 0.0–0.7)
Eosinophils Relative: 3.3 % (ref 0.0–5.0)
HCT: 36.6 % (ref 36.0–46.0)
Hemoglobin: 12.4 g/dL (ref 12.0–15.0)
Lymphocytes Relative: 17.2 % (ref 12.0–46.0)
Lymphs Abs: 1.1 10*3/uL (ref 0.7–4.0)
MCHC: 33.9 g/dL (ref 30.0–36.0)
MCV: 90.9 fl (ref 78.0–100.0)
Monocytes Absolute: 0.6 10*3/uL (ref 0.1–1.0)
Monocytes Relative: 9 % (ref 3.0–12.0)
Neutro Abs: 4.4 10*3/uL (ref 1.4–7.7)
Neutrophils Relative %: 69.4 % (ref 43.0–77.0)
Platelets: 206 10*3/uL (ref 150.0–400.0)
RBC: 4.02 Mil/uL (ref 3.87–5.11)
RDW: 13.7 % (ref 11.5–15.5)
WBC: 6.3 10*3/uL (ref 4.0–10.5)

## 2021-08-24 LAB — LIPID PANEL
Cholesterol: 146 mg/dL (ref 0–200)
HDL: 65.4 mg/dL (ref >39.00)
LDL Cholesterol: 53 mg/dL (ref 0–99)
NonHDL: 80.93
Total CHOL/HDL Ratio: 2
Triglycerides: 142 mg/dL (ref 0.0–149.0)
VLDL: 28.4 mg/dL (ref 0.0–40.0)

## 2021-08-28 ENCOUNTER — Other Ambulatory Visit: Payer: Self-pay | Admitting: Family Medicine

## 2021-09-12 ENCOUNTER — Telehealth (INDEPENDENT_AMBULATORY_CARE_PROVIDER_SITE_OTHER): Payer: PPO | Admitting: Physician Assistant

## 2021-09-12 ENCOUNTER — Other Ambulatory Visit: Payer: Self-pay | Admitting: Physician Assistant

## 2021-09-12 DIAGNOSIS — N183 Chronic kidney disease, stage 3 unspecified: Secondary | ICD-10-CM | POA: Diagnosis not present

## 2021-09-12 DIAGNOSIS — U071 COVID-19: Secondary | ICD-10-CM | POA: Diagnosis not present

## 2021-09-12 MED ORDER — MOLNUPIRAVIR EUA 200MG CAPSULE: 4.0000 | ORAL_CAPSULE | Freq: Two times a day (BID) | ORAL | 0 refills | Status: DC

## 2021-09-12 MED ORDER — MOLNUPIRAVIR EUA 200MG CAPSULE: 4.0000 | ORAL_CAPSULE | Freq: Two times a day (BID) | ORAL | 0 refills | Status: AC

## 2021-09-12 NOTE — Telephone Encounter (Signed)
Crystal Carroll, message from pharmacy regarding Bromide. Pharmacy comment: we dont dispense bc we are unable to receive from state. please send elsewhere for patient.

## 2021-09-12 NOTE — Telephone Encounter (Signed)
Spoke to pt told her need to know what pharmacy to send medication to since [facility] can not get the medication. Pt said send to [facility] [place]. Told her okay will have it sent there. Pt verbalized understanding.

## 2021-09-12 NOTE — Progress Notes (Signed)
Virtual Visit via Video   I connected with Crystal Carroll on 09/12/21 at 11:20 AM EDT by a video enabled telemedicine application and verified that I am speaking with the correct person using two identifiers. Location patient: Home Location provider: [facility], Office Persons participating in the virtual visit: Dejai A Hardgrave, [name] PA-C  I discussed the limitations of evaluation and management by telemedicine and the availability of in person appointments. The patient expressed understanding and agreed to proceed.  Subjective:   HPI:   Patient is requesting evaluation for possible COVID-19.  Symptoms started yesterday.  She has a scratchy throat.  She has had COVID before this started the same way.  In the past she took molnupiravir with good results.  She is requesting this today.  Denies: Fever, chills, cough, shortness of breath, chest pain, recent travel  She has not tried anything for her symptoms yet  She has chronic kidney disease stage III  She has received her COVID vaccinations and boosters  She does not have any history of underlying lung disorder   Smoking status: Crystal Carroll  reports that she quit smoking about 8 years ago. Her smoking use included cigarettes. She has a 37.50 pack-year smoking history. She has never used smokeless tobacco. If female, currently pregnant? '[]'$   Yes '[]'$   No  ROS: See pertinent positives and negatives per HPI.  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 02/24/2019   Hyperparathyroidism, primary ([place]) 07/17/2018   History of lacunar cerebrovascular accident (CVA) 06/18/2018   Osteoporosis 03/13/2018   Patellofemoral arthritis 03/05/2018   DDD (degenerative disc disease), lumbar  03/05/2018   Unstable gait 01/20/2018   Aortic atherosclerosis ([place]) 06/12/2017   Palpitations 05/12/2017   Vitamin B12 deficiency 05/12/2017   Cervical radiculopathy 04/03/2016   Caregiver burden 04/03/2016   Essential tremor 04/18/2015   Adenomatous colon polyp 06/14/2014   Heavy alcohol use 04/14/2014   H/O cold sores 10/15/2013   CKD (chronic kidney disease), stage III ([place]) 10/15/2013   Former smoker 12/24/2006   Hyperlipidemia 05/26/2006   GAD (generalized anxiety disorder) 05/26/2006   Essential hypertension 05/26/2006   GERD 05/26/2006    Social History   Tobacco Use   Smoking status: Former    Packs/day: 0.75    Years: 50.00    Total pack years: 37.50    Types: Cigarettes    Quit date: 03/07/2013    Years since quitting: 8.5   Smokeless tobacco: Never  Substance Use Topics   Alcohol use: Yes    Comment: 1-2 daily spritzers per pt    Current Outpatient Medications:    molnupiravir EUA (LAGEVRIO) 200 mg CAPS capsule, Take 4 capsules (800 mg total) by mouth 2 (two) times daily for 5 days., Disp: 40 capsule, Rfl: 0   acetaminophen (TYLENOL) 500 MG tablet, Take 1,000 mg by mouth 3 (three) times daily., Disp: , Rfl:    aspirin EC 81 MG tablet, Take 81 mg by mouth at bedtime. , Disp: , Rfl:    buPROPion (WELLBUTRIN XL) 150 MG 24 hr tablet, Take 1 tablet (150 mg total) by mouth daily., Disp: 30 tablet, Rfl: 5   busPIRone (BUSPAR) 5 MG tablet, Take 1 tablet (5 mg total) by mouth 2 (two) times daily as needed (anxiety)., Disp: 60  tablet, Rfl: 5   Cholecalciferol (VITAMIN D3) 50 MCG (2000 UT) TABS, Take 2,000 Units by mouth daily., Disp: , Rfl:    citalopram (CELEXA) 20 MG tablet, TAKE ONE TABLET BY MOUTH ONCE DAILY, Disp: 90 tablet, Rfl: 0   ezetimibe (ZETIA) 10 MG tablet, Take 1 tablet (10 mg total) by mouth daily., Disp: 90 tablet, Rfl: 3   meloxicam (MOBIC) 7.5 MG tablet, Take 1 tablet (7.5 mg total) by mouth daily., Disp: 30 tablet, Rfl: 0   pantoprazole (PROTONIX) 20 MG  tablet, TAKE ONE TABLET BY MOUTH DAILY., Disp: 90 tablet, Rfl: 1   rosuvastatin (CRESTOR) 40 MG tablet, TAKE ONE TABLET BY MOUTH DAILY, Disp: 90 tablet, Rfl: 0   vitamin B-12 (CYANOCOBALAMIN) 1000 MCG tablet, Take 1,000 mcg by mouth daily., Disp: , Rfl:   Allergies  Allergen Reactions   Codeine Nausea Only   Sulfamethoxazole     GI upset    Objective:   VITALS: Per patient if applicable, see vitals. GENERAL: Alert, appears well and in no acute distress. HEENT: Atraumatic, conjunctiva clear, no obvious abnormalities on inspection of external nose and ears. NECK: Normal movements of the head and neck. CARDIOPULMONARY: No increased WOB. Speaking in clear sentences. I:E ratio WNL.  MS: Moves all visible extremities without noticeable abnormality. PSYCH: Pleasant and cooperative, well-groomed. Speech normal rate and rhythm. Affect is appropriate. Insight and judgement are appropriate. Attention is focused, linear, and appropriate.  NEURO: CN grossly intact. Oriented as arrived to appointment on time with no prompting. Moves both UE equally.  SKIN: No obvious lesions, wounds, erythema, or cyanosis noted on face or hands.  Assessment and Plan:   [name] was seen today for covid positive.  Diagnoses and all orders for this visit:  COVID-19  Stage 3 chronic kidney disease, unspecified whether stage 3a or 3b CKD ([place])  Other orders -     molnupiravir EUA (LAGEVRIO) 200 mg CAPS capsule; Take 4 capsules (800 mg total) by mouth 2 (two) times daily for 5 days.    No red flags on discussion, patient is not in any obvious distress during our visit. Discussed progression of most viral illnesses, and recommended supportive care at this point in time.  We will prescribe molnupiravir for her to take for her COVID illness today given her chronic kidney disease Discussed over the counter supportive care options, including Tylenol 500 mg q 8 hours, with recommendations to push fluids and  rest. Reviewed return precautions including new/worsening fever, SOB, new/worsening cough, sudden onset changes of symptoms. Recommended need to self-quarantine and practice social distancing until symptoms resolve. I recommend that patient follow-up if symptoms worsen or persist despite treatment x 7-10 days, sooner if needed.  I discussed the assessment and treatment plan with the patient. The patient was provided an opportunity to ask questions and all were answered. The patient agreed with the plan and demonstrated an understanding of the instructions.   The patient was advised to call back or seek an in-person evaluation if the symptoms worsen or if the condition fails to improve as anticipated.    [place], [name] 09/12/2021

## 2021-09-13 ENCOUNTER — Other Ambulatory Visit: Payer: Self-pay | Admitting: Family Medicine

## 2021-10-01 ENCOUNTER — Encounter: Payer: Self-pay | Admitting: *Deleted

## 2021-10-16 ENCOUNTER — Ambulatory Visit (INDEPENDENT_AMBULATORY_CARE_PROVIDER_SITE_OTHER): Payer: PPO | Admitting: Family Medicine

## 2021-10-16 ENCOUNTER — Encounter: Payer: Self-pay | Admitting: Family Medicine

## 2021-10-16 VITALS — BP 118/70 | HR 70 | Temp 98.8°F | Ht 62.0 in | Wt 155.6 lb

## 2021-10-16 DIAGNOSIS — Z122 Encounter for screening for malignant neoplasm of respiratory organs: Secondary | ICD-10-CM | POA: Diagnosis not present

## 2021-10-16 DIAGNOSIS — E213 Hyperparathyroidism, unspecified: Secondary | ICD-10-CM

## 2021-10-16 DIAGNOSIS — Z0000 Encounter for general adult medical examination without abnormal findings: Secondary | ICD-10-CM

## 2021-10-16 DIAGNOSIS — Z87891 Personal history of nicotine dependence: Secondary | ICD-10-CM | POA: Diagnosis not present

## 2021-10-16 NOTE — Patient Instructions (Addendum)
[facility] contact Please call to schedule visit and/or procedure Address: [address], [place], [place] [address] Phone: [phone]   Let us know when you get flu shot  We will call you within two weeks about your referral to lung cancer screening program. If you do not hear within 2 weeks, give [place] a call.    Schedule follow up with Dr. Kelton Pillar to get prolia set up and follow up on thyroid and parathyroid  Recommended follow up: Return in about 4 months (around 02/16/2022) for followup or sooner if needed.Schedule b4 you leave.

## 2021-10-16 NOTE — Progress Notes (Signed)
Phone [phone]   Subjective:  Patient presents today for their annual physical. Chief complaint-noted.   See problem oriented charting- ROS- full  review of systems was completed and negative except for: back pain, muscle aches, anxiety  The following were reviewed and entered/updated in epic: Past Medical History:  Diagnosis Date   Anxiety    Aortic atherosclerosis ([place])    Aortic valvar stenosis 11/2018   Mild, noted on ECHO   Arthritis    cervical neck   CKD (chronic kidney disease), stage III ([place])    Stable   GERD (gastroesophageal reflux disease)    History of TIA (transient ischemic attack)    per patient, didn't know about it   Hyperlipidemia    Hyperparathyroidism ([place])    Hypertension    History of    Junctional rhythm    Osteoporosis    Palpitations    Thyroid nodule    2.0 cm solid nodule in the inferior left thyroid lobe    Patient Active Problem List   Diagnosis Date Noted   Osteoporosis without current pathological fracture 09/10/2019    Priority: High   History of lobectomy of thyroid 02/24/2019    Priority: High   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 02/24/2019    Priority: High   Hyperparathyroidism, primary ([place]) 07/17/2018    Priority: High   History of lacunar cerebrovascular accident (CVA) 06/18/2018    Priority: High   Caregiver burden 04/03/2016    Priority: High   Mild aortic stenosis 07/19/2019    Priority: Medium    Osteoporosis 03/13/2018    Priority: Medium    DDD (degenerative disc disease), lumbar 03/05/2018    Priority: Medium    Aortic atherosclerosis ([place]) 06/12/2017    Priority: Medium    Palpitations 05/12/2017    Priority: Medium    Vitamin B12 deficiency 05/12/2017    Priority: Medium    Cervical radiculopathy 04/03/2016    Priority: Medium    Essential tremor 04/18/2015    Priority: Medium    Heavy alcohol use 04/14/2014    Priority: Medium    CKD (chronic kidney  disease), stage III ([place]) 10/15/2013    Priority: Medium    Former smoker 12/24/2006    Priority: Medium    Hyperlipidemia 05/26/2006    Priority: Medium    GAD (generalized anxiety disorder) 05/26/2006    Priority: Medium    Essential hypertension 05/26/2006    Priority: Medium    Patellofemoral arthritis 03/05/2018    Priority: Low   Unstable gait 01/20/2018    Priority: Low   Adenomatous colon polyp 06/14/2014    Priority: Low   H/O cold sores 10/15/2013    Priority: Low   GERD 05/26/2006    Priority: Low   Past Surgical History:  Procedure Laterality Date   CERVICAL LAMINECTOMY  2004   Earle Gell   COLONOSCOPY  05/31/2014   Deatra Ina   ESOPHAGOGASTRODUODENOSCOPY     PARATHYROIDECTOMY Left 01/13/2019   Procedure: LEFT PARATHYROIDECTOMY;  Surgeon: Armandina Gemma, MD;  Location: [facility];  Service: General;  Laterality: Left;   THYROID LOBECTOMY Left 01/13/2019   Procedure: LEFT THYROID LOBECTOMY;  Surgeon: Armandina Gemma, MD;  Location: [facility];  Service: General;  Laterality: Left;    Family History  Problem Relation Age of Onset   Melanoma Mother 50       labia   Heart attack Father 64       smoker, rheumatic fever as child  Colon cancer Neg Hx    Colon polyps Neg Hx    Esophageal cancer Neg Hx    Rectal cancer Neg Hx    Stomach cancer Neg Hx     Medications- reviewed and updated Current Outpatient Medications  Medication Sig Dispense Refill   acetaminophen (TYLENOL) 500 MG tablet Take 1,000 mg by mouth 3 (three) times daily.     aspirin EC 81 MG tablet Take 81 mg by mouth at bedtime.      buPROPion (WELLBUTRIN XL) 150 MG 24 hr tablet Take 1 tablet (150 mg total) by mouth daily. 30 tablet 5   busPIRone (BUSPAR) 5 MG tablet Take 1 tablet (5 mg total) by mouth 2 (two) times daily as needed (anxiety). 60 tablet 5   Cholecalciferol (VITAMIN D3) 50 MCG (2000 UT) TABS Take 2,000 Units by mouth daily.     citalopram (CELEXA) 20 MG tablet TAKE ONE TABLET BY MOUTH ONCE  DAILY 90 tablet 0   ezetimibe (ZETIA) 10 MG tablet Take 1 tablet (10 mg total) by mouth daily. 90 tablet 3   meloxicam (MOBIC) 7.5 MG tablet Take 1 tablet (7.5 mg total) by mouth daily. (Patient taking differently: Take 7.5 mg by mouth as needed.) 30 tablet 0   pantoprazole (PROTONIX) 20 MG tablet TAKE ONE TABLET BY MOUTH DAILY. 90 tablet 1   rosuvastatin (CRESTOR) 40 MG tablet TAKE ONE TABLET BY MOUTH DAILY 90 tablet 0   vitamin B-12 (CYANOCOBALAMIN) 1000 MCG tablet Take 1,000 mcg by mouth daily.     No current facility-administered medications for this visit.    Allergies-reviewed and updated Allergies  Allergen Reactions   Codeine Nausea Only   Sulfamethoxazole     GI upset    Social History   Social History Narrative   Married to [employer] (patient) in 1981. 2nd marriage-1 child with 1 adopted grandchild.       Retired as Statistician.       Hobbies: antiques, former Firefighter      Exercise: none currently.       Patient is right-handed. She lives with her husband in a two level home. She does not exercise.      Objective  Objective:  BP 118/70   Pulse 70   Temp 98.8 F (37.1 C)   Ht '5\' 2"'$  (1.575 m)   Wt 155 lb 9.6 oz (70.6 kg)   LMP  (LMP Unknown)   SpO2 96%   BMI 28.46 kg/m  Gen: NAD, resting comfortably, slightly tender on scalp where fell  HEENT: Mucous membranes are moist. Oropharynx normal. TM normal on R, some cerumen on left but normal hearing Neck: no thyromegaly CV: RRR no murmurs rubs or gallops Lungs: CTAB no crackles, wheeze, rhonchi Abdomen: soft/nontender/nondistended/normal bowel sounds. No rebound or guarding.  Ext: no edema Skin: warm, dry Neuro: grossly normal, moves all extremities, PERRLA   Assessment and Plan   76 y.o. female presenting for annual physical.  Health Maintenance counseling: 1. Anticipatory guidance: Patient counseled regarding regular dental exams -q3 months, eye exams -yearly,  avoiding smoking and  second hand smoke , limiting alcohol to 1 beverage per day- 4 oz per wine- 2 in each 2 oz glass spritzer at most , no illicit drugs.   2. Risk factor reduction:  Advised patient of need for regular exercise and diet rich and fruits and vegetables to reduce risk of heart attack and stroke.  Exercise- not exercising- encouraged to start- uncover exercise bike and  get started! Or has gym she can use.  Diet/weight management-weight down 1 pound from last physical.  Wt Readings from Last 3 Encounters:  10/16/21 155 lb 9.6 oz (70.6 kg)  08/23/21 152 lb 3.2 oz (69 kg)  07/11/21 149 lb (67.6 kg)  3. Immunizations/screenings/ancillary studies-plans for flu shot later in the season with her husband . Just had covid a few weeks ago- has recovered well- would recommend holding off on shot at least 4-6 months -Discussed RSV- she declines  Immunization History  Administered Date(s) Administered   Fluad Quad(high Dose 65+) 09/28/2018, 01/18/2020, 10/19/2020   Influenza Split 12/03/2010, 12/09/2011   Influenza Whole 10/18/2009   Influenza, High Dose Seasonal PF 11/02/2012, 10/18/2015, 10/21/2016, 10/21/2017   Influenza,inj,Quad PF,6+ Mos 10/15/2013, 12/06/2014   Influenza-Unspecified 10/08/2015   PFIZER(Purple Top)SARS-COV-2 Vaccination 01/28/2019, 02/18/2019, 12/16/2019   Pneumococcal Conjugate-13 04/15/2014   Pneumococcal Polysaccharide-23 12/03/2010   Td 01/07/1998, 10/18/2009   Tdap 10/18/2016   Zoster Recombinat (Shingrix) 11/04/2018, 10/26/2020, 03/13/2021   Zoster, Live 10/18/2009  4. Cervical cancer screening- gynecology yearly but past age based screening recs- likes to continue due to family history melanoma in GU region  5. Breast cancer screening-  breast exam self exam monthly- and follow-up with GYN and mammogram last on  07/19/21 6. Colon cancer screening - in August place referral to GI for rectal bleeding-prior discussion with Dr. Candis Schatz recommended no further colonoscopies but since  symptomatic was going to sit down to discuss options.  He had been on meloxicam which could increase risk- only uses 2 days and then off for a while plus on PPI so risk is lower. No subsequent bleeding after a month of issues in august- suspect hemorrhoids most likely but still reasonable to sit down.  7. Skin cancer screening- sees Dr. Derrel Nip. advised regular sunscreen use. Denies worrisome, changing, or new skin lesions.  8. Birth control/STD check- monogamous/postmenopausal  9. Osteoporosis screening at 56- last 03/17/2020 with Dr. Kelton Pillar  -former smoker -  quit 6 years ago. Smoked a pack or over a day. Enrolled in lung cancer screening program in past- referred back today- last check 2020  Status of chronic or acute concerns   #Hyperlipidemia/aortic atherosclerosis/ history of stroke- on aspirin 81 mg with LDL goal under 70 S: Compliant with rosuvastatin 40 mg, zetia 10 mg Lab Results  Component Value Date   CHOL 146 08/23/2021   HDL 65.40 08/23/2021   LDLCALC 53 08/23/2021   LDLDIRECT 106 (H) 07/19/2016   TRIG 142.0 08/23/2021   CHOLHDL 2 08/23/2021  A/P: Lipids at ideal goal for someone with aortic atherosclerosis with preference for this to be under 70 for bad cholesterol-well under goal.  Continue aspirin for stroke prevention.  Continue risk factor modification for aortic atherosclerosis  #Hypertension/CKD stage III S: Patient remains off lisinopril unfortunately blood pressure remains controlled  -GFR has been stable largely in the 40s lately- in the past had been in the 30s- last visit up over 50! A/P: hypertension well controlled- continue current meds. CKD III slightly better last check- avoid nsaids- recommended against meloxicam   #Primary hyperparathyroidism status post parathyroidectomy January 2021 #History of thyroid nodule found to be noninvasive follicular neoplasm of thyroid with papillary-like nuclear features now status post lobectomy of thyroid January  2021 S: Hyperparathyroidism likely led to osteoporosis-see discussion below.  Status post parathyroidectomy.  Follows with Dr. Kelton Pillar last visit March 2022   Thyroid nodule removed at same time as parathyroidectomy A/P: calcium normal on last check. Needs  to schedule endocrine follow up- we recently did labs- I think low yield to check thyroid at this point until visit  -tsh normal 2023  #Osteoporosis S: Patient was placed on Fosamax once a week in the past but current plan for treatment is parathyroidectomy in light of hyperparathyroidism/high calcium A/P: Prolia recommended march 2022 by Dr. Kelton Pillar- she plans to schedule follow up   #Unstable gait S: Started January 2020.  Stat head CT to rule out subdural hematoma.  Initially also seem to have some lower extremity weakness and numbness.  There was concern for lumbar stenosis.  Symptoms improved with time.  Ultimately did MRI of the brain to rule out prior cerebellar stroke-none was detected.  Did have prior lacunar stroke and patient was placed back on aspirin.  Patient later saw Dr. Tomi Likens July 28, 2018-MRI of cervical spine without contrast was ordered.  There were some disc bulges and MRI of lumbar spine was also ordered September 2020-this showed spinal stenosis which was thought to be contributing to leg pain and balance issues.    - Neurology offered referral to neurosurgery- she saw neurosurgery who recommended no surgery  She had a fall Sunday night- tripped oversomething in her home- very rare for her. Fell onto iron jockey with her head and knocked it over- no dizziness or headaches and not on blood thinner. No headaches or blurry vision A/P: history of unstable gait. Tripped unfortunately and hit head. Called EMS nad reassured- has not had subsequent issues. They said no concussion.    #GERD S: Compliant with Protonix 40 mg-->'20mg'$  and had to add pepcid with dinner at times in past hasnt needed lately A/P: overall stable-  continue current meds- next time we do labs consder b12 update    #Anxiety/stress management S: Compliant with Celexa 20 mg daily, Wellbutrin '150mg'$  XR (reduced from '300mg'$  due to anxiety concern). Buspirone 5 mg BID -only taking once in am and doing well -Prior on xanax but had started to feel oversedated.  A/P: overall doing well- contiue current meds   Recommended follow up: No follow-ups on file. Future Appointments  Date Time Provider [place]  11/19/2021  3:15 PM [facility]    Lab/Order associations:NOT fasting   ICD-10-CM   1. Preventative health care  Z00.00     2. Former smoker  Z87.891 Ambulatory Referral for Lung Cancer Scre    3. Screening for lung cancer  Z12.2 Ambulatory Referral for Lung Cancer Scre      No orders of the defined types were placed in this encounter.   Return precautions advised.  [name], MD

## 2021-10-18 ENCOUNTER — Other Ambulatory Visit: Payer: Self-pay | Admitting: Sports Medicine

## 2021-10-19 NOTE — Progress Notes (Unsigned)
    Benito Mccreedy D.[place] Phone: [phone]   Assessment and Plan:     There are no diagnoses linked to this encounter.  ***   Pertinent previous records reviewed include ***   Follow Up: ***     Subjective:   I, Adan Beal, am serving as a Education administrator for Doctor Glennon Mac   Chief Complaint: low back pain    HPI:    01/17/21 Patient is a 76 year old female complaining of back and leg pain. Patient states that the pain has been going on for about a week She says that her husband has Parkinson's and she has helped him a lot over the years. She says that it has finally caught up with her. Is also complaining of arm pain .She has taken OTC Advil but has not worked as well. no numbness , tingling , no radiating pain    02/07/2021 Patient states that she is doing well has been doing everything that he prescribed , hasn't been doing HEP but back is better , pains come and go    06/19/21 Patient is a 76 year old female complaining of low back pain. Patient states that she is in so much pain she is her husbands care giver she was trying to lift him out of his recliner    07/11/2021 Patient states that she hurts she still hurts , still having to lift husband up and out of chair    10/22/2021 Patient states   Relevant Historical Information: Hypertension, osteoporosis, CKD stage III, history of CVA  Additional pertinent review of systems negative.   Current Outpatient Medications:    acetaminophen (TYLENOL) 500 MG tablet, Take 1,000 mg by mouth 3 (three) times daily., Disp: , Rfl:    aspirin EC 81 MG tablet, Take 81 mg by mouth at bedtime. , Disp: , Rfl:    buPROPion (WELLBUTRIN XL) 150 MG 24 hr tablet, Take 1 tablet (150 mg total) by mouth daily., Disp: 30 tablet, Rfl: 5   busPIRone (BUSPAR) 5 MG tablet, Take 1 tablet (5 mg total) by mouth 2 (two) times daily as needed (anxiety)., Disp: 60 tablet,  Rfl: 5   Cholecalciferol (VITAMIN D3) 50 MCG (2000 UT) TABS, Take 2,000 Units by mouth daily., Disp: , Rfl:    citalopram (CELEXA) 20 MG tablet, TAKE ONE TABLET BY MOUTH ONCE DAILY, Disp: 90 tablet, Rfl: 0   ezetimibe (ZETIA) 10 MG tablet, Take 1 tablet (10 mg total) by mouth daily., Disp: 90 tablet, Rfl: 3   meloxicam (MOBIC) 7.5 MG tablet, Take 1 tablet (7.5 mg total) by mouth daily. (Patient taking differently: Take 7.5 mg by mouth as needed.), Disp: 30 tablet, Rfl: 0   pantoprazole (PROTONIX) 20 MG tablet, TAKE ONE TABLET BY MOUTH DAILY., Disp: 90 tablet, Rfl: 1   rosuvastatin (CRESTOR) 40 MG tablet, TAKE ONE TABLET BY MOUTH DAILY, Disp: 90 tablet, Rfl: 0   vitamin B-12 (CYANOCOBALAMIN) 1000 MCG tablet, Take 1,000 mcg by mouth daily., Disp: , Rfl:    Objective:     There were no vitals filed for this visit.    There is no height or weight on file to calculate BMI.    Physical Exam:    ***   Electronically signed by:  Benito Mccreedy D.[facility] 3:50 PM 10/19/21

## 2021-10-22 ENCOUNTER — Ambulatory Visit: Payer: PPO | Admitting: Sports Medicine

## 2021-10-22 VITALS — BP 122/68 | HR 70 | Ht 62.0 in | Wt 155.0 lb

## 2021-10-22 DIAGNOSIS — G8929 Other chronic pain: Secondary | ICD-10-CM | POA: Diagnosis not present

## 2021-10-22 DIAGNOSIS — M5442 Lumbago with sciatica, left side: Secondary | ICD-10-CM | POA: Diagnosis not present

## 2021-10-22 DIAGNOSIS — M5441 Lumbago with sciatica, right side: Secondary | ICD-10-CM | POA: Diagnosis not present

## 2021-10-22 DIAGNOSIS — M5136 Other intervertebral disc degeneration, lumbar region: Secondary | ICD-10-CM | POA: Diagnosis not present

## 2021-10-22 MED ORDER — MELOXICAM 7.5 MG PO TABS: 7.5000 mg | ORAL_TABLET | Freq: Every day | ORAL | 0 refills | Status: DC

## 2021-10-22 NOTE — Patient Instructions (Addendum)
Good to see you  - Start meloxicam 7.5 mg daily x2 weeks.  If still having pain after 2 weeks, complete 3rd-week of meloxicam. May use remaining meloxicam as needed once daily for pain control.  Do not to use additional NSAIDs while taking meloxicam.  May use Tylenol [phone] mg 2 to 3 times a day for breakthrough pain. As needed follow up

## 2021-10-27 DIAGNOSIS — I739 Peripheral vascular disease, unspecified: Secondary | ICD-10-CM | POA: Diagnosis not present

## 2021-10-27 DIAGNOSIS — E663 Overweight: Secondary | ICD-10-CM | POA: Diagnosis not present

## 2021-10-27 DIAGNOSIS — I129 Hypertensive chronic kidney disease with stage 1 through stage 4 chronic kidney disease, or unspecified chronic kidney disease: Secondary | ICD-10-CM | POA: Diagnosis not present

## 2021-10-27 DIAGNOSIS — N1831 Chronic kidney disease, stage 3a: Secondary | ICD-10-CM | POA: Diagnosis not present

## 2021-10-27 DIAGNOSIS — K219 Gastro-esophageal reflux disease without esophagitis: Secondary | ICD-10-CM | POA: Diagnosis not present

## 2021-10-27 DIAGNOSIS — F330 Major depressive disorder, recurrent, mild: Secondary | ICD-10-CM | POA: Diagnosis not present

## 2021-10-27 DIAGNOSIS — I10 Essential (primary) hypertension: Secondary | ICD-10-CM | POA: Diagnosis not present

## 2021-10-27 DIAGNOSIS — F419 Anxiety disorder, unspecified: Secondary | ICD-10-CM | POA: Diagnosis not present

## 2021-10-27 DIAGNOSIS — M810 Age-related osteoporosis without current pathological fracture: Secondary | ICD-10-CM | POA: Diagnosis not present

## 2021-10-27 DIAGNOSIS — M1990 Unspecified osteoarthritis, unspecified site: Secondary | ICD-10-CM | POA: Diagnosis not present

## 2021-10-27 DIAGNOSIS — E785 Hyperlipidemia, unspecified: Secondary | ICD-10-CM | POA: Diagnosis not present

## 2021-10-27 DIAGNOSIS — E041 Nontoxic single thyroid nodule: Secondary | ICD-10-CM | POA: Diagnosis not present

## 2021-11-19 ENCOUNTER — Ambulatory Visit (INDEPENDENT_AMBULATORY_CARE_PROVIDER_SITE_OTHER): Payer: PPO

## 2021-11-19 VITALS — Wt 155.0 lb

## 2021-11-19 DIAGNOSIS — Z0000 Encounter for general adult medical examination without abnormal findings: Secondary | ICD-10-CM | POA: Diagnosis not present

## 2021-11-19 NOTE — Patient Instructions (Signed)
[name] , Thank you for taking time to come for your Medicare Wellness Visit. I appreciate your ongoing commitment to your health goals. Please review the following plan we discussed and let me know if I can assist you in the future.   These are the goals we discussed:  Goals      Patient Stated     Lose weight     Patient Stated     Stay alive         This is a list of the screening recommended for you and due dates:  Health Maintenance  Topic Date Due   Screening for Lung Cancer  08/25/2019   COVID-19 Vaccine (4 - Pfizer series) 02/10/2020   Medicare Annual Wellness Visit  10/30/2021   Colon Cancer Screening  05/18/2022*   Tetanus Vaccine  10/19/2026   Pneumonia Vaccine  Completed   DEXA scan (bone density measurement)  Completed   Hepatitis C Screening: USPSTF Recommendation to screen - Ages 33-79 yo.  Completed   Zoster (Shingles) Vaccine  Completed   HPV Vaccine  Aged Out   Flu Shot  Discontinued  *Topic was postponed. The date shown is not the original due date.    Advanced directives: copies in chart   Conditions/risks identified: live a healthy life   Next appointment: Follow up in one year for your annual wellness visit    Preventive Care 65 Years and Older, Female Preventive care refers to lifestyle choices and visits with your health care provider that can promote health and wellness. What does preventive care include? A yearly physical exam. This is also called an annual well check. Dental exams once or twice a year. Routine eye exams. Ask your health care provider how often you should have your eyes checked. Personal lifestyle choices, including: Daily care of your teeth and gums. Regular physical activity. Eating a healthy diet. Avoiding tobacco and drug use. Limiting alcohol use. Practicing safe sex. Taking low-dose aspirin every day. Taking vitamin and mineral supplements as recommended by your health care provider. What happens during an annual  well check? The services and screenings done by your health care provider during your annual well check will depend on your age, overall health, lifestyle risk factors, and family history of disease. Counseling  Your health care provider may ask you questions about your: Alcohol use. Tobacco use. Drug use. Emotional well-being. Home and relationship well-being. Sexual activity. Eating habits. History of falls. Memory and ability to understand (cognition). Work and work Statistician. Reproductive health. Screening  You may have the following tests or measurements: Height, weight, and BMI. Blood pressure. Lipid and cholesterol levels. These may be checked every 5 years, or more frequently if you are over 32 years old. Skin check. Lung cancer screening. You may have this screening every year starting at age 28 if you have a 30-pack-year history of smoking and currently smoke or have quit within the past 15 years. Fecal occult blood test (FOBT) of the stool. You may have this test every year starting at age 68. Flexible sigmoidoscopy or colonoscopy. You may have a sigmoidoscopy every 5 years or a colonoscopy every 10 years starting at age 75. Hepatitis C blood test. Hepatitis B blood test. Sexually transmitted disease (STD) testing. Diabetes screening. This is done by checking your blood sugar (glucose) after you have not eaten for a while (fasting). You may have this done every 1-3 years. Bone density scan. This is done to screen for osteoporosis. You may have  this done starting at age 77. Mammogram. This may be done every 1-2 years. Talk to your health care provider about how often you should have regular mammograms. Talk with your health care provider about your test results, treatment options, and if necessary, the need for more tests. Vaccines  Your health care provider may recommend certain vaccines, such as: Influenza vaccine. This is recommended every year. Tetanus, diphtheria,  and acellular pertussis (Tdap, Td) vaccine. You may need a Td booster every 10 years. Zoster vaccine. You may need this after age 71. Pneumococcal 13-valent conjugate (PCV13) vaccine. One dose is recommended after age 72. Pneumococcal polysaccharide (PPSV23) vaccine. One dose is recommended after age 53. Talk to your health care provider about which screenings and vaccines you need and how often you need them. This information is not intended to replace advice given to you by your health care provider. Make sure you discuss any questions you have with your health care provider. Document Released: 01/20/2015 Document Revised: 09/13/2015 Document Reviewed: 10/25/2014 Elsevier Interactive Patient Education  2017 [place] Prevention in the Home Falls can cause injuries. They can happen to people of all ages. There are many things you can do to make your home safe and to help prevent falls. What can I do on the outside of my home? Regularly fix the edges of walkways and driveways and fix any cracks. Remove anything that might make you trip as you walk through a door, such as a raised step or threshold. Trim any bushes or trees on the path to your home. Use bright outdoor lighting. Clear any walking paths of anything that might make someone trip, such as rocks or tools. Regularly check to see if handrails are loose or broken. Make sure that both sides of any steps have handrails. Any raised decks and porches should have guardrails on the edges. Have any leaves, snow, or ice cleared regularly. Use sand or salt on walking paths during winter. Clean up any spills in your garage right away. This includes oil or grease spills. What can I do in the bathroom? Use night lights. Install grab bars by the toilet and in the tub and shower. Do not use towel bars as grab bars. Use non-skid mats or decals in the tub or shower. If you need to sit down in the shower, use a plastic, non-slip  stool. Keep the floor dry. Clean up any water that spills on the floor as soon as it happens. Remove soap buildup in the tub or shower regularly. Attach bath mats securely with double-sided non-slip rug tape. Do not have throw rugs and other things on the floor that can make you trip. What can I do in the bedroom? Use night lights. Make sure that you have a light by your bed that is easy to reach. Do not use any sheets or blankets that are too big for your bed. They should not hang down onto the floor. Have a firm chair that has side arms. You can use this for support while you get dressed. Do not have throw rugs and other things on the floor that can make you trip. What can I do in the kitchen? Clean up any spills right away. Avoid walking on wet floors. Keep items that you use a lot in easy-to-reach places. If you need to reach something above you, use a strong step stool that has a grab bar. Keep electrical cords out of the way. Do not use floor polish or  wax that makes floors slippery. If you must use wax, use non-skid floor wax. Do not have throw rugs and other things on the floor that can make you trip. What can I do with my stairs? Do not leave any items on the stairs. Make sure that there are handrails on both sides of the stairs and use them. Fix handrails that are broken or loose. Make sure that handrails are as long as the stairways. Check any carpeting to make sure that it is firmly attached to the stairs. Fix any carpet that is loose or worn. Avoid having throw rugs at the top or bottom of the stairs. If you do have throw rugs, attach them to the floor with carpet tape. Make sure that you have a light switch at the top of the stairs and the bottom of the stairs. If you do not have them, ask someone to add them for you. What else can I do to help prevent falls? Wear shoes that: Do not have high heels. Have rubber bottoms. Are comfortable and fit you well. Are closed at the  toe. Do not wear sandals. If you use a stepladder: Make sure that it is fully opened. Do not climb a closed stepladder. Make sure that both sides of the stepladder are locked into place. Ask someone to hold it for you, if possible. Clearly mark and make sure that you can see: Any grab bars or handrails. First and last steps. Where the edge of each step is.s Use tools that help you move around (mobility aids) if they are needed. These include: Canes. Walkers. Scooters. Crutches. Turn on the lights when you go into a dark area. Replace any light bulbs as soon as they burn out. Set up your furniture so you have a clear path. Avoid moving your furniture around. If any of your floors are uneven, fix them. If there are any pets around you, be aware of where they are. Review your medicines with your doctor. Some medicines can make you feel dizzy. This can increase your chance of falling. Ask your doctor what other things that you can do to help prevent falls. This information is not intended to replace advice given to you by your health care provider. Make sure you discuss any questions you have with your health care provider. Document Released: 10/20/2008 Document Revised: 06/01/2015 Document Reviewed: 01/28/2014 Elsevier Interactive Patient Education  2017 [employer].

## 2021-11-19 NOTE — Progress Notes (Signed)
I connected with  Crystal Carroll on 11/19/21 by a audio enabled telemedicine application and verified that I am speaking with the correct person using two identifiers.  Patient Location: Home  Provider Location: Office/Clinic  I discussed the limitations of evaluation and management by telemedicine. The patient expressed understanding and agreed to proceed.   Subjective:   Crystal Carroll is a 76 y.o. female who presents for Medicare Annual (Subsequent) preventive examination.  Review of Systems     Cardiac Risk Factors include: advanced age (>17mn, >>24women);dyslipidemia;hypertension     Objective:    Today's Vitals   11/19/21 1457  Weight: 155 lb (70.3 kg)   Body mass index is 28.35 kg/m.     11/19/2021    3:05 PM 10/30/2020    2:45 PM 10/29/2019    3:02 PM 01/13/2019    4:27 PM 01/13/2019    7:01 AM 01/06/2019   11:27 AM 09/28/2018    2:51 PM  Advanced Directives  Does Patient Have a Medical Advance Directive? Yes Yes Yes Yes Yes Yes Yes  Type of AParamed[place]of [place] will Healthcare Power of [place]Living will Living will [place]Living will [place]Living will HCaswellLiving will  Does patient want to make changes to medical advance directive? No - Patient declined    No - Patient declined No - Patient declined No - Patient declined  Copy of HFithianin Chart? Yes - validated most recent copy scanned in chart (See row information) Yes - validated most recent copy scanned in chart (See row information) Yes - validated most recent copy scanned in chart (See row information)  No - copy requested No - copy requested Yes - validated most recent copy scanned in chart (See row information)  Would patient like information on creating a medical advance directive?    No - Patient declined No - Patient declined      Current Medications  (verified) Outpatient Encounter Medications as of 11/19/2021  Medication Sig   acetaminophen (TYLENOL) 500 MG tablet Take 1,000 mg by mouth 3 (three) times daily.   aspirin EC 81 MG tablet Take 81 mg by mouth at bedtime.    buPROPion (WELLBUTRIN XL) 150 MG 24 hr tablet Take 1 tablet (150 mg total) by mouth daily.   busPIRone (BUSPAR) 5 MG tablet Take 1 tablet (5 mg total) by mouth 2 (two) times daily as needed (anxiety).   Cholecalciferol (VITAMIN D3) 50 MCG (2000 UT) TABS Take 2,000 Units by mouth daily.   citalopram (CELEXA) 20 MG tablet TAKE ONE TABLET BY MOUTH ONCE DAILY   ezetimibe (ZETIA) 10 MG tablet Take 1 tablet (10 mg total) by mouth daily.   meloxicam (MOBIC) 7.5 MG tablet Take 1 tablet (7.5 mg total) by mouth daily. (Patient taking differently: Take 7.5 mg by mouth as needed.)   pantoprazole (PROTONIX) 20 MG tablet TAKE ONE TABLET BY MOUTH DAILY.   rosuvastatin (CRESTOR) 40 MG tablet TAKE ONE TABLET BY MOUTH DAILY   vitamin B-12 (CYANOCOBALAMIN) 1000 MCG tablet Take 1,000 mcg by mouth daily.   meloxicam (MOBIC) 7.5 MG tablet Take 1 tablet (7.5 mg total) by mouth daily. (Patient not taking: Reported on 11/19/2021)   No facility-administered encounter medications on file as of 11/19/2021.    Allergies (verified) Codeine and Sulfamethoxazole   History: Past Medical History:  Diagnosis Date   Anxiety    Aortic atherosclerosis ([place]  Aortic valvar stenosis 11/2018   Mild, noted on ECHO   Arthritis    cervical neck   CKD (chronic kidney disease), stage III (HCC)    Stable   GERD (gastroesophageal reflux disease)    History of TIA (transient ischemic attack)    per patient, didn't know about it   Hyperlipidemia    Hyperparathyroidism ([place])    Hypertension    History of    Junctional rhythm    Osteoporosis    Palpitations    Thyroid nodule    2.0 cm solid nodule in the inferior left thyroid lobe    Past Surgical History:  Procedure Laterality Date   CERVICAL  LAMINECTOMY  2004   Earle Gell   COLONOSCOPY  05/31/2014   Deatra Ina   ESOPHAGOGASTRODUODENOSCOPY     PARATHYROIDECTOMY Left 01/13/2019   Procedure: LEFT PARATHYROIDECTOMY;  Surgeon: Armandina Gemma, MD;  Location: [facility];  Service: General;  Laterality: Left;   THYROID LOBECTOMY Left 01/13/2019   Procedure: LEFT THYROID LOBECTOMY;  Surgeon: Armandina Gemma, MD;  Location: [facility];  Service: General;  Laterality: Left;   Family History  Problem Relation Age of Onset   Melanoma Mother 78       labia   Heart attack Father 2       smoker, rheumatic fever as child   Colon cancer Neg Hx    Colon polyps Neg Hx    Esophageal cancer Neg Hx    Rectal cancer Neg Hx    Stomach cancer Neg Hx    Social History   Socioeconomic History   Marital status: Married    Spouse name: Brad   Number of children: 1   Years of education: 14   Highest education level: Associate degree: academic program  Occupational History   Occupation: Retired  Tobacco Use   Smoking status: Former    Packs/day: 0.75    Years: 50.00    Total pack years: 37.50    Types: Cigarettes    Quit date: 03/07/2013    Years since quitting: 8.7   Smokeless tobacco: Never  Vaping Use   Vaping Use: Never used  Substance and Sexual Activity   Alcohol use: Yes    Comment: 1-2 daily spritzers per pt   Drug use: No   Sexual activity: Not on file  Other Topics Concern   Not on file  Social History Narrative   Married to [employer] (patient) in 1981. 2nd marriage-1 child with 1 adopted grandchild.       Retired as Statistician.       Hobbies: antiques, former Firefighter      Exercise: none currently.       Patient is right-handed. She lives with her husband in a two level home. She does not exercise.      Social Determinants of Health   Financial Resource Strain: Low Risk  (11/19/2021)   Overall Financial Resource Strain (CARDIA)    Difficulty of Paying Living Expenses: Not hard at all  Food Insecurity: No  Food Insecurity (11/19/2021)   Hunger Vital Sign    Worried About Running Out of Food in the Last Year: Never true    Ran Out of Food in the Last Year: Never true  Transportation Needs: No Transportation Needs (11/19/2021)   PRAPARE - Hydrologist (Medical): No    Lack of Transportation (Non-Medical): No  Physical Activity: Inactive (11/19/2021)   Exercise Vital Sign    Days  of Exercise per Week: 0 days    Minutes of Exercise per Session: 0 min  Stress: No Stress Concern Present (11/19/2021)   [place]    Feeling of Stress : Not at all  Social Connections: Moderately Integrated (11/19/2021)   Social Connection and Isolation Panel [NHANES]    Frequency of Communication with Friends and Family: More than three times a week    Frequency of Social Gatherings with Friends and Family: More than three times a week    Attends Religious Services: Never    Marine scientist or Organizations: Yes    Attends Music therapist: 1 to 4 times per year    Marital Status: Married    Tobacco Counseling Counseling given: Not Answered   Clinical Intake:  Pre-visit preparation completed: Yes  Pain : No/denies pain     BMI - recorded: 28.35 Nutritional Status: BMI 25 -29 Overweight Nutritional Risks: None Diabetes: No  How often do you need to have someone help you when you read instructions, pamphlets, or other written materials from your doctor or pharmacy?: 1 - Never  Diabetic?no  Interpreter Needed?: No  Information entered by :: Charlott Rakes, LPN   Activities of Daily Living    11/19/2021    3:07 PM  In your present state of health, do you have any difficulty performing the following activities:  Hearing? 0  Vision? 0  Difficulty concentrating or making decisions? 0  Walking or climbing stairs? 1  Comment harder coming down  Dressing or bathing? 0  Doing  errands, shopping? 0  Preparing Food and eating ? N  Using the Toilet? N  In the past six months, have you accidently leaked urine? N  Do you have problems with loss of bowel control? N  Managing your Medications? N  Managing your Finances? N  Housekeeping or managing your Housekeeping? N    Patient Care Team: Marin Olp, MD as PCP - General (Family Medicine) Pieter Partridge, DO as Consulting Physician (Neurology) Juluis Rainier as Consulting Physician (Optometry) Shamleffer, Melanie Crazier, MD as Consulting Physician (Endocrinology) Armandina Gemma, MD as Consulting Physician (General Surgery) Madelin Rear, [facility] (Inactive) as Pharmacist (Pharmacist)  Indicate any recent Medical Services you may have received from other than [facility] providers in the past year (date may be approximate).     Assessment:   This is a routine wellness examination for Crystal Carroll.  Hearing/Vision screen Hearing Screening - Comments:: Pt denies any hearing issues  Vision Screening - Comments:: Pt follows up with Dr Idolina Primer for annul eye exams   Dietary issues and exercise activities discussed: Current Exercise Habits: The patient does not participate in regular exercise at present   Goals Addressed             This Visit's Progress    patient       Live long and healthy        Depression Screen    11/19/2021    3:01 PM 10/31/2020    2:35 PM 10/30/2020    2:44 PM 06/09/2020    4:04 PM 01/26/2020   10:51 AM 10/29/2019    2:59 PM 07/19/2019    4:00 PM  PHQ 2/9 Scores  PHQ - 2 Score 0 0 0 0 0 0 0  PHQ- 9 Score    0   0    Fall Risk    11/19/2021    3:06 PM 01/05/2021  12:57 PM 10/31/2020    2:35 PM 10/30/2020    2:45 PM 06/09/2020    4:03 PM  Fall Risk   Falls in the past year? 1 0 0 0 0  Number falls in past yr: 1   0 0  Injury with Fall? 1   0 0  Comment EMS called      Risk for fall due to : History of fall(s);Impaired vision   Impaired vision;Impaired balance/gait   Follow up Falls  prevention discussed   Falls prevention discussed     FALL RISK PREVENTION PERTAINING TO THE HOME:  Any stairs in or around the home? No  If so, are there any without handrails? No  Home free of loose throw rugs in walkways, pet beds, electrical cords, etc? Yes  Adequate lighting in your home to reduce risk of falls? Yes   ASSISTIVE DEVICES UTILIZED TO PREVENT FALLS:  Life alert? No  Use of a cane, walker or w/c? Yes  Grab bars in the bathroom? Yes  Shower chair or bench in shower? Yes  Elevated toilet seat or a handicapped toilet? Yes   TIMED UP AND GO:  Was the test performed? No .   Cognitive Function:        11/19/2021    3:08 PM 10/30/2020    2:48 PM 10/29/2019    3:05 PM 09/28/2018    2:52 PM  6CIT Screen  What Year? 0 points 0 points 0 points 0 points  What month? 0 points 0 points 0 points 0 points  What time? 0 points 0 points  0 points  Count back from 20 0 points 0 points 0 points 0 points  Months in reverse 0 points 0 points 0 points 0 points  Repeat phrase 0 points 0 points 0 points 0 points  Total Score 0 points 0 points  0 points    Immunizations Immunization History  Administered Date(s) Administered   Fluad Quad(high Dose 65+) 09/28/2018, 01/18/2020, 10/19/2020   Influenza Split 12/03/2010, 12/09/2011   Influenza Whole 10/18/2009   Influenza, High Dose Seasonal PF 11/02/2012, 10/18/2015, 10/21/2016, 10/21/2017   Influenza,inj,Quad PF,6+ Mos 10/15/2013, 12/06/2014   Influenza-Unspecified 10/08/2015   PFIZER(Purple Top)SARS-COV-2 Vaccination 01/28/2019, 02/18/2019, 12/16/2019   Pneumococcal Conjugate-13 04/15/2014   Pneumococcal Polysaccharide-23 12/03/2010   Td 01/07/1998, 10/18/2009   Tdap 10/18/2016   Zoster Recombinat (Shingrix) 11/04/2018, 10/26/2020, 03/13/2021   Zoster, Live 10/18/2009    TDAP status: Up to date  Flu Vaccine status: Due, Education has been provided regarding the importance of this vaccine. Advised may receive this  vaccine at local pharmacy or Health Dept. Aware to provide a copy of the vaccination record if obtained from local pharmacy or Health Dept. Verbalized acceptance and understanding.  Pneumococcal vaccine status: Up to date  Covid-19 vaccine status: Completed vaccines  Qualifies for Shingles Vaccine? Yes   Zostavax completed Yes   Shingrix Completed?: Yes  Screening Tests Health Maintenance  Topic Date Due   Lung Cancer Screening  08/25/2019   COVID-19 Vaccine (4 - Pfizer series) 02/10/2020   COLONOSCOPY (Pts 45-19yr Insurance coverage will need to be confirmed)  05/18/2022 (Originally 05/31/2019)   Medicare Annual Wellness (AWV)  11/20/2022   TETANUS/TDAP  10/19/2026   Pneumonia Vaccine 76 Years old  Completed   DEXA SCAN  Completed   Hepatitis C Screening  Completed   Zoster Vaccines- Shingrix  Completed   HPV VACCINES  Aged Out   INFLUENZA VACCINE  Discontinued    Health Maintenance  Health Maintenance Due  Topic Date Due   Lung Cancer Screening  08/25/2019   COVID-19 Vaccine (4 - Pfizer series) 02/10/2020    Colorectal cancer screening: Type of screening: Colonoscopy. Completed 05/31/14. Repeat every 5 years  Mammogram status: Completed 07/19/21. Repeat every year  Bone Density status: Completed 03/18/20. Results reflect: Bone density results: OSTEOPOROSIS. Repeat every 2 years.   Additional Screening:  Hepatitis C Screening:  Completed 12/07/14  Vision Screening: Recommended annual ophthalmology exams for early detection of glaucoma and other disorders of the eye. Is the patient up to date with their annual eye exam?  Yes  Who is the provider or what is the name of the office in which the patient attends annual eye exams? Dr Idolina Primer  If pt is not established with a provider, would they like to be referred to a provider to establish care? No .   Dental Screening: Recommended annual dental exams for proper oral hygiene  Community Resource Referral / Chronic Care  Management: CRR required this visit?  No   CCM required this visit?  No      Plan:     I have personally reviewed and noted the following in the patient's chart:   Medical and social history Use of alcohol, tobacco or illicit drugs  Current medications and supplements including opioid prescriptions. Patient is not currently taking opioid prescriptions. Functional ability and status Nutritional status Physical activity Advanced directives List of other physicians Hospitalizations, surgeries, and ER visits in previous 12 months Vitals Screenings to include cognitive, depression, and falls Referrals and appointments  In addition, I have reviewed and discussed with patient certain preventive protocols, quality metrics, and best practice recommendations. A written personalized care plan for preventive services as well as general preventive health recommendations were provided to patient.     Willette Brace, LPN   01/09/7251   Nurse Notes: none

## 2021-11-27 ENCOUNTER — Other Ambulatory Visit: Payer: Self-pay | Admitting: Family Medicine

## 2021-12-10 ENCOUNTER — Telehealth: Payer: Self-pay | Admitting: Family Medicine

## 2021-12-10 NOTE — Telephone Encounter (Signed)
See Triage note. Pt scheduled tomorrow 12/5 to see you.

## 2021-12-10 NOTE — Telephone Encounter (Signed)
Routing to PCP team and SW,PA-C team for 12/11/21 appt.  Patient Name: Crystal Carroll Gender: Female DOB: 11-08-1945 Age: 76 Y 2 M 2 D Return Phone Number: [phone] (Primary) Address: City/ State/ Zip: [place]  [address] Client [place] [facility] [place] Client Site [facility] Provider [name]- MD Contact Type Call Who Is Calling Patient / Member / Family / Caregiver Call Type Triage / Clinical Relationship To Patient Self Return Phone Number [phone] (Primary) Chief Complaint Vaginal Bleeding Reason for Call Symptomatic / Request for [place] states she is having vaginal bleeding. This started about 15 minutes ago. She went to the bathroom and when she wiped, there was red blood. When she got up this morning, it felt a little swollen down there. Translation No Nurse Assessment Nurse: [name], RN, [name] Date/Time [name] Time): 12/10/2021 3:06:14 PM Confirm and document reason for call. If symptomatic, describe symptoms. ---Caller states she is having vaginal bleeding. This started about 15 minutes ago. She went to the bathroom and when she wiped, there was red blood. When she got up this morning, it felt a little swollen down there. Went back and bleeding is not as bad as before. Did make an appointment for tomorrow in the office. Does the patient have any new or worsening symptoms? ---Yes Will a triage be completed? ---Yes Related visit to physician within the last 2 weeks? ---No Does the PT have any chronic conditions? (i.e. diabetes, asthma, this includes High risk factors for pregnancy, etc.) ---Yes List chronic conditions. ---HTN- controlled Is this a behavioral health or substance abuse call? ---No  Guidelines Guideline Title Affirmed Question Affirmed Notes Nurse Date/Time [name] Time) Vaginal Bleeding  - Postmenopausal Postmenopausal vaginal bleeding [name] 12/10/2021 3:13:46 PM Disp. Time [name] Time) Disposition Final User 12/10/2021 2:52:29 PM Attempt made - message left [name] 12/10/2021 3:16:52 PM See PCP within 2 Weeks Yes [name], RN, [name] Final Disposition 12/10/2021 3:16:52 PM See PCP within 2 Weeks Yes [name], RN, [name] Disagree/Comply Comply Caller Understands Yes PreDisposition Call Doctor Care Advice Given Per Guideline CALL BACK IF: * Severe abdomen pain or dizziness occurs * Bleeding increases * You become worse CARE ADVICE given per Vaginal Bleeding, Postmenopausal (Adult) guideline. SEE PCP WITHIN 2 WEEKS: * You need to be seen for this ongoing problem within the next 2 weeks. Referrals REFERRED TO PCP OFFICE

## 2021-12-10 NOTE — Telephone Encounter (Signed)
Pt states: -vaginal bleeding.  -Wants to see SW, PA-C. -Unsure of what to do but would like to come in tomorrow 12/11/21   Pt warm transferred to [facility] patient coordinator with PCP triage/ Team Health Nurse

## 2021-12-11 ENCOUNTER — Ambulatory Visit (INDEPENDENT_AMBULATORY_CARE_PROVIDER_SITE_OTHER): Payer: PPO | Admitting: Physician Assistant

## 2021-12-11 ENCOUNTER — Ambulatory Visit: Payer: PPO | Admitting: Gastroenterology

## 2021-12-11 ENCOUNTER — Encounter: Payer: Self-pay | Admitting: Physician Assistant

## 2021-12-11 ENCOUNTER — Telehealth: Payer: Self-pay | Admitting: Gastroenterology

## 2021-12-11 VITALS — BP 130/70 | HR 65 | Temp 97.7°F | Ht 62.0 in | Wt 155.2 lb

## 2021-12-11 DIAGNOSIS — N9089 Other specified noninflammatory disorders of vulva and perineum: Secondary | ICD-10-CM

## 2021-12-11 NOTE — Progress Notes (Signed)
Crystal Carroll is a 76 y.o. female here for a new problem.  History of Present Illness:   Chief Complaint  Patient presents with   Vaginal Bleeding    Pt c/o bright red vaginal bleeding yesterday afternoon moderate flow to spotting 2:30 to 5:00. Denies back pain, pelvic pain or cramps.    HPI  Vaginal bleeding Patient is complaining of vaginal bleeding starting yesterday. Patient explains that yesterday morning when she woke up her urethral area felt tender and puffy.She reports that yesterday around 2:30 pm she went to use the bathroom (urinate) and there was bright red blood on the tissue paper as she wiped. She then states that when she wiped again at 5:30 am there was no blood.    She denies any dizziness, pelvic pain, back pain, vaginal pain, abdominal pain, pain with urination, prior UTIs, and abdominal trama.   She has hx of normal pap smears.  Her mother died from melanoma on her vulva.   Past Medical History:  Diagnosis Date   Anxiety    Aortic atherosclerosis ([place])    Aortic valvar stenosis 11/2018   Mild, noted on ECHO   Arthritis    cervical neck   CKD (chronic kidney disease), stage III (HCC)    Stable   GERD (gastroesophageal reflux disease)    History of TIA (transient ischemic attack)    per patient, didn't know about it   Hyperlipidemia    Hyperparathyroidism ([place])    Hypertension    History of    Junctional rhythm    Osteoporosis    Palpitations    Thyroid nodule    2.0 cm solid nodule in the inferior left thyroid lobe      Social History   Tobacco Use   Smoking status: Former    Packs/day: 0.75    Years: 50.00    Total pack years: 37.50    Types: Cigarettes    Quit date: 03/07/2013    Years since quitting: 8.7   Smokeless tobacco: Never  Vaping Use   Vaping Use: Never used  Substance Use Topics   Alcohol use: Yes    Comment: 1-2 daily spritzers per pt   Drug use: No    Past Surgical History:  Procedure Laterality Date   CERVICAL  LAMINECTOMY  2004   Earle Gell   COLONOSCOPY  05/31/2014   Deatra Ina   ESOPHAGOGASTRODUODENOSCOPY     PARATHYROIDECTOMY Left 01/13/2019   Procedure: LEFT PARATHYROIDECTOMY;  Surgeon: Armandina Gemma, MD;  Location: [facility];  Service: General;  Laterality: Left;   THYROID LOBECTOMY Left 01/13/2019   Procedure: LEFT THYROID LOBECTOMY;  Surgeon: Armandina Gemma, MD;  Location: [facility];  Service: General;  Laterality: Left;    Family History  Problem Relation Age of Onset   Melanoma Mother 58       labia   Heart attack Father 8       smoker, rheumatic fever as child   Colon cancer Neg Hx    Colon polyps Neg Hx    Esophageal cancer Neg Hx    Rectal cancer Neg Hx    Stomach cancer Neg Hx     Allergies  Allergen Reactions   Codeine Nausea Only   Sulfamethoxazole     GI upset    Current Medications:   Current Outpatient Medications:    acetaminophen (TYLENOL) 500 MG tablet, Take 1,000 mg by mouth 3 (three) times daily., Disp: , Rfl:    aspirin EC 81 MG tablet, Take  81 mg by mouth at bedtime. , Disp: , Rfl:    buPROPion (WELLBUTRIN XL) 150 MG 24 hr tablet, Take 1 tablet (150 mg total) by mouth daily., Disp: 30 tablet, Rfl: 5   busPIRone (BUSPAR) 5 MG tablet, Take 1 tablet (5 mg total) by mouth 2 (two) times daily as needed (anxiety)., Disp: 60 tablet, Rfl: 5   Cholecalciferol (VITAMIN D3) 50 MCG (2000 UT) TABS, Take 2,000 Units by mouth daily., Disp: , Rfl:    citalopram (CELEXA) 20 MG tablet, TAKE ONE TABLET BY MOUTH ONCE DAILY, Disp: 90 tablet, Rfl: 0   ezetimibe (ZETIA) 10 MG tablet, Take 1 tablet (10 mg total) by mouth daily., Disp: 90 tablet, Rfl: 3   meloxicam (MOBIC) 7.5 MG tablet, Take 1 tablet (7.5 mg total) by mouth daily., Disp: 90 tablet, Rfl: 0   pantoprazole (PROTONIX) 20 MG tablet, TAKE ONE TABLET BY MOUTH DAILY., Disp: 90 tablet, Rfl: 1   rosuvastatin (CRESTOR) 40 MG tablet, TAKE ONE TABLET BY MOUTH DAILY, Disp: 90 tablet, Rfl: 0   vitamin B-12 (CYANOCOBALAMIN) 1000 MCG  tablet, Take 1,000 mcg by mouth daily., Disp: , Rfl:    Review of Systems:   Review of Systems  Gastrointestinal:  Negative for abdominal pain.  Genitourinary:        (+) vaginal bleeding  Musculoskeletal:  Negative for back pain.  Neurological:  Negative for dizziness.    Vitals:   Vitals:   12/11/21 1010  BP: 130/70  Pulse: 65  Temp: 97.7 F (36.5 C)  TempSrc: Temporal  SpO2: 97%  Weight: 155 lb 4 oz (70.4 kg)  Height: '5\' 2"'$  (1.575 m)     Body mass index is 28.4 kg/m.  Physical Exam:   Physical Exam Exam conducted with a chaperone present Butch Penny, LPN and Dr. Esther Hardy).  Constitutional:      Appearance: Normal appearance. She is well-developed.  HENT:     Head: Normocephalic and atraumatic.  Eyes:     General: Lids are normal.     Extraocular Movements: Extraocular movements intact.     Conjunctiva/sclera: Conjunctivae normal.  Pulmonary:     Effort: Pulmonary effort is normal.  Genitourinary:    Vagina: Normal. No bleeding.     Comments: Localized area of swelling and erythema inner labia near urethra with slight excoriation and scant bleeding Musculoskeletal:        General: Normal range of motion.     Cervical back: Normal range of motion and neck supple.  Skin:    General: Skin is warm and dry.  Neurological:     Mental Status: She is alert and oriented to person, place, and time.  Psychiatric:        Attention and Perception: Attention and perception normal.        Mood and Affect: Mood normal.        Behavior: Behavior normal.        Thought Content: Thought content normal.        Judgment: Judgment normal.     Assessment and Plan:   Labial lesion Suspect bleeding is coming from suspicious lesion Vaginal exam did not show any concerning bleeding - however exam was brief and limited Due to abnormal labial finding -- will urgently refer to gynecology Appointment secured for 12/13/21 with Sumner Boast -- appreciate coordination of  care  I,[name],acting as a scribe for [name], PA.,have documented all relevant documentation on the behalf of [name], PA,as directed by  [name],  PA while in the presence of [name], [name].  I, [name], [name], have reviewed all documentation for this visit. The documentation on 12/11/21 for the exam, diagnosis, procedures, and orders are all accurate and complete.  Time spent with patient today was 32 minutes which consisted of chart review, discussing diagnosis, work up, treatment answering questions and documentation.  [name], PA-C

## 2021-12-11 NOTE — Patient Instructions (Addendum)
It was great to see you!  STAT referral to gynecology with Dr. Sumner Boast Your appointment is on Thursday -- see below   Take care,  [name] PA-C

## 2021-12-11 NOTE — Telephone Encounter (Signed)
Good afternoon Dr. Candis Schatz,   Patient called stating that she needed to reschedule her appointment with you today at 3:20 due to going to another doctor and getting some news she was not wanting. Patient stated she was very sorry.   Patient was rescheduled for 1/8 at 3:40.

## 2021-12-13 ENCOUNTER — Other Ambulatory Visit (HOSPITAL_COMMUNITY)
Admission: RE | Admit: 2021-12-13 | Discharge: 2021-12-13 | Disposition: A | Discharge status: Home / Self Care | Payer: PPO | Source: Ambulatory Visit | Attending: Obstetrics and Gynecology | Admitting: Obstetrics and Gynecology

## 2021-12-13 ENCOUNTER — Encounter: Payer: Self-pay | Admitting: Obstetrics and Gynecology

## 2021-12-13 ENCOUNTER — Ambulatory Visit (INDEPENDENT_AMBULATORY_CARE_PROVIDER_SITE_OTHER): Payer: PPO | Admitting: Obstetrics and Gynecology

## 2021-12-13 VITALS — BP 130/84 | HR 74 | Wt 155.0 lb

## 2021-12-13 DIAGNOSIS — N909 Noninflammatory disorder of vulva and perineum, unspecified: Secondary | ICD-10-CM | POA: Insufficient documentation

## 2021-12-13 DIAGNOSIS — N904 Leukoplakia of vulva: Secondary | ICD-10-CM | POA: Diagnosis not present

## 2021-12-13 DIAGNOSIS — N9069 Other specified hypertrophy of vulva: Secondary | ICD-10-CM | POA: Diagnosis not present

## 2021-12-13 DIAGNOSIS — N766 Ulceration of vulva: Secondary | ICD-10-CM | POA: Insufficient documentation

## 2021-12-13 NOTE — Patient Instructions (Signed)

## 2021-12-13 NOTE — Progress Notes (Signed)
76 y.o. [id number] Married White or Caucasian Not Hispanic or Latino female here for evaluation of a vulvar lesions.  She presented to [name], [name] 2 days ago with bleeding. She was noted to have a vulvar lesion on exam and it was felt that the bleeding was coming from the lesion.  She denies pain or irritation.  Patient's mother had melanoma on her labia.    No LMP recorded (lmp unknown). Patient is postmenopausal.          Sexually active: No.  The current method of family planning is post menopausal status.    Exercising: No.  The patient does not participate in regular exercise at present. Smoker:  no  Health Maintenance: Pap: 11/18/13 WNL  History of abnormal Pap:  no MMG:  07/18/21 Bi-rads one neg. BMD:   03/18/20 osteoporosis  Colonoscopy: 05/31/2014 follow up 5 years patient was supposed to have one this year and she was unable to do it.  TDaP:  10/18/16 Gardasil: n/a   reports that she quit smoking about 8 years ago. Her smoking use included cigarettes. She has a 37.50 pack-year smoking history. She has never used smokeless tobacco. She reports current alcohol use. She reports that she does not use drugs.  Past Medical History:  Diagnosis Date   Anxiety    Aortic atherosclerosis ([place])    Aortic valvar stenosis 11/2018   Mild, noted on ECHO   Arthritis    cervical neck   CKD (chronic kidney disease), stage III (HCC)    Stable   GERD (gastroesophageal reflux disease)    History of TIA (transient ischemic attack)    per patient, didn't know about it   Hyperlipidemia    Hyperparathyroidism ([place])    Hypertension    History of    Junctional rhythm    Osteoporosis    Palpitations    Thyroid nodule    2.0 cm solid nodule in the inferior left thyroid lobe     Past Surgical History:  Procedure Laterality Date   CERVICAL LAMINECTOMY  2004   Earle Gell   COLONOSCOPY  05/31/2014   Deatra Ina   ESOPHAGOGASTRODUODENOSCOPY     PARATHYROIDECTOMY Left 01/13/2019    Procedure: LEFT PARATHYROIDECTOMY;  Surgeon: Armandina Gemma, MD;  [facility];  Service: General;  Laterality: Left;   THYROID LOBECTOMY Left 01/13/2019   Procedure: LEFT THYROID LOBECTOMY;  Surgeon: Armandina Gemma, MD;  [facility];  Service: General;  Laterality: Left;    Current Outpatient Medications  Medication Sig Dispense Refill   acetaminophen (TYLENOL) 500 MG tablet Take 1,000 mg by mouth 3 (three) times daily.     aspirin EC 81 MG tablet Take 81 mg by mouth at bedtime.      buPROPion (WELLBUTRIN XL) 150 MG 24 hr tablet Take 1 tablet (150 mg total) by mouth daily. 30 tablet 5   busPIRone (BUSPAR) 5 MG tablet Take 1 tablet (5 mg total) by mouth 2 (two) times daily as needed (anxiety). 60 tablet 5   Cholecalciferol (VITAMIN D3) 50 MCG (2000 UT) TABS Take 2,000 Units by mouth daily.     citalopram (CELEXA) 20 MG tablet TAKE ONE TABLET BY MOUTH ONCE DAILY 90 tablet 0   ezetimibe (ZETIA) 10 MG tablet Take 1 tablet (10 mg total) by mouth daily. 90 tablet 3   meloxicam (MOBIC) 7.5 MG tablet Take 1 tablet (7.5 mg total) by mouth daily. 90 tablet 0   pantoprazole (PROTONIX) 20 MG tablet TAKE ONE TABLET BY  MOUTH DAILY. 90 tablet 1   rosuvastatin (CRESTOR) 40 MG tablet TAKE ONE TABLET BY MOUTH DAILY 90 tablet 0   vitamin B-12 (CYANOCOBALAMIN) 1000 MCG tablet Take 1,000 mcg by mouth daily.     No current facility-administered medications for this visit.    Family History  Problem Relation Age of Onset   Melanoma Mother 17       labia   Heart attack Father 33       smoker, rheumatic fever as child   Colon cancer Neg Hx    Colon polyps Neg Hx    Esophageal cancer Neg Hx    Rectal cancer Neg Hx    Stomach cancer Neg Hx     Review of Systems  All other systems reviewed and are negative.   Exam:   BP 130/84 (BP Location: Right Arm)   Pulse 74   Wt 155 lb (70.3 kg)   LMP  (LMP Unknown)   SpO2 96%   BMI 28.35 kg/m   Weight change: '@WEIGHTCHANGE'$ @ Height:      Ht Readings  from Last 3 Encounters:  12/11/21 '5\' 2"'$  (1.575 m)  10/22/21 '5\' 2"'$  (6.754 m)  10/16/21 '5\' 2"'$  (1.575 m)    General appearance: alert, cooperative and appears stated age   Pelvic: External genitalia: diffuse whitening of the anterior vulva with agglutination of the clitoris to the labia majora bilaterally, absent labia minora and a small ulceration in the area of agglutination over the clitoris. Suspect the ulceration is the region of bleeding. No active bleeding.              Urethra:  normal appearing urethra with no masses, tenderness or lesions, no abnormalities within the introitus              Bartholins and Skenes: normal                  The risks of the procedure were reviewed with the patient and a consent was signed. The area was cleansed with betadine and injected with 1% lidocaine. A 4 mm punch biopsy was used to remove a circular piece of tissue including the ulceration and surrounding white tissue. The defect was closed with 4-0 vicryl. The patient tolerated the procedure well.   1. Vulvar disorder Suspect lichen sclerosis - Surgical pathology( [place]/ [facility]) -Discussed lichen sclerosis and risk of vulvar cancer -Recommended yearly vulvar checks -Once biopsy is back will treat with steroid ointment and have her return for a breast exam, pelvic exam and f/u visit   2. Vulvar ulceration Suspect irritation from lichen sclerosis - Surgical pathology( [place]/ [facility])  CC: [name], PA

## 2021-12-18 ENCOUNTER — Other Ambulatory Visit: Payer: Self-pay | Admitting: Family Medicine

## 2021-12-18 ENCOUNTER — Other Ambulatory Visit: Payer: Self-pay | Admitting: Obstetrics and Gynecology

## 2021-12-18 MED ORDER — CLOBETASOL PROPIONATE 0.05 % EX OINT: TOPICAL_OINTMENT | CUTANEOUS | 0 refills | Status: DC

## 2021-12-19 LAB — SURGICAL PATHOLOGY

## 2021-12-25 ENCOUNTER — Ambulatory Visit (INDEPENDENT_AMBULATORY_CARE_PROVIDER_SITE_OTHER): Payer: PPO | Admitting: Family Medicine

## 2021-12-25 ENCOUNTER — Encounter: Payer: Self-pay | Admitting: Family Medicine

## 2021-12-25 VITALS — BP 118/70 | HR 69 | Temp 98.0°F | Ht 62.0 in | Wt 155.2 lb

## 2021-12-25 DIAGNOSIS — N183 Chronic kidney disease, stage 3 unspecified: Secondary | ICD-10-CM | POA: Diagnosis not present

## 2021-12-25 DIAGNOSIS — E785 Hyperlipidemia, unspecified: Secondary | ICD-10-CM | POA: Diagnosis not present

## 2021-12-25 DIAGNOSIS — E538 Deficiency of other specified B group vitamins: Secondary | ICD-10-CM | POA: Diagnosis not present

## 2021-12-25 DIAGNOSIS — I10 Essential (primary) hypertension: Secondary | ICD-10-CM | POA: Diagnosis not present

## 2021-12-25 LAB — CBC WITH DIFFERENTIAL/PLATELET
Basophils Absolute: 0 10*3/uL (ref 0.0–0.1)
Basophils Relative: 0.4 % (ref 0.0–3.0)
Eosinophils Absolute: 0.2 10*3/uL (ref 0.0–0.7)
Eosinophils Relative: 2.9 % (ref 0.0–5.0)
HCT: 38.2 % (ref 36.0–46.0)
Hemoglobin: 13.4 g/dL (ref 12.0–15.0)
Lymphocytes Relative: 14.3 % (ref 12.0–46.0)
Lymphs Abs: 1.2 10*3/uL (ref 0.7–4.0)
MCHC: 35.1 g/dL (ref 30.0–36.0)
MCV: 88.2 fl (ref 78.0–100.0)
Monocytes Absolute: 0.6 10*3/uL (ref 0.1–1.0)
Monocytes Relative: 7.4 % (ref 3.0–12.0)
Neutro Abs: 6.1 10*3/uL (ref 1.4–7.7)
Neutrophils Relative %: 75 % (ref 43.0–77.0)
Platelets: 231 10*3/uL (ref 150.0–400.0)
RBC: 4.33 Mil/uL (ref 3.87–5.11)
RDW: 13.5 % (ref 11.5–15.5)
WBC: 8.1 10*3/uL (ref 4.0–10.5)

## 2021-12-25 LAB — COMPREHENSIVE METABOLIC PANEL
ALT: 8 U/L (ref 0–35)
AST: 12 U/L (ref 0–37)
Albumin: 4.5 g/dL (ref 3.5–5.2)
Alkaline Phosphatase: 56 U/L (ref 39–117)
BUN: 15 mg/dL (ref 6–23)
CO2: 27 mEq/L (ref 19–32)
Calcium: 9.5 mg/dL (ref 8.4–10.5)
Chloride: 105 mEq/L (ref 96–112)
Creatinine, Ser: 0.93 mg/dL (ref 0.40–1.20)
GFR: 59.41 mL/min — ABNORMAL LOW (ref >60.00)
Glucose, Bld: 97 mg/dL (ref 70–99)
Potassium: 3.7 mEq/L (ref 3.5–5.1)
Sodium: 141 mEq/L (ref 135–145)
Total Bilirubin: 0.4 mg/dL (ref 0.2–1.2)
Total Protein: 6.7 g/dL (ref 6.0–8.3)

## 2021-12-25 LAB — VITAMIN B12: Vitamin B-12: 393 pg/mL (ref 211–911)

## 2021-12-25 LAB — TSH: TSH: 1.49 u[IU]/mL (ref 0.35–5.50)

## 2021-12-25 MED ORDER — BUSPIRONE HCL 7.5 MG PO TABS: 7.5000 mg | ORAL_TABLET | Freq: Two times a day (BID) | ORAL | 3 refills | Status: DC | PRN

## 2021-12-25 NOTE — Progress Notes (Signed)
Phone [phone] In person visit   Subjective:   Crystal Carroll is a 76 y.o. year old very pleasant female patient who presents for/with See problem oriented charting Chief Complaint  Patient presents with   Follow-up   Hypertension   balance    Pt states balance is off again.   throat    Pt states she feels like something is stuck in her throat but it does not prevent her from eating.   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 02/24/2019    Priority: High   Hyperparathyroidism, primary ([place]) 07/17/2018    Priority: High   History of lacunar cerebrovascular accident (CVA) 06/18/2018    Priority: High   Caregiver burden 04/03/2016    Priority: High   Mild aortic stenosis 07/19/2019    Priority: Medium    Osteoporosis 03/13/2018    Priority: Medium    DDD (degenerative disc disease), lumbar 03/05/2018    Priority: Medium    Aortic atherosclerosis ([place]) 06/12/2017    Priority: Medium    Palpitations 05/12/2017    Priority: Medium    Vitamin B12 deficiency 05/12/2017    Priority: Medium    Cervical radiculopathy 04/03/2016    Priority: Medium    Essential tremor 04/18/2015    Priority: Medium    Heavy alcohol use 04/14/2014    Priority: Medium    CKD (chronic kidney disease), stage III ([place]) 10/15/2013    Priority: Medium    Former smoker 12/24/2006    Priority: Medium    Hyperlipidemia 05/26/2006    Priority: Medium    GAD (generalized anxiety disorder) 05/26/2006    Priority: Medium    Essential hypertension 05/26/2006    Priority: Medium    Patellofemoral arthritis 03/05/2018    Priority: Low   Unstable gait 01/20/2018    Priority: Low   Adenomatous colon polyp 06/14/2014    Priority: Low   H/O  cold sores 10/15/2013    Priority: Low   GERD 05/26/2006    Priority: Low    Medications- reviewed and updated Current Outpatient Medications  Medication Sig Dispense Refill   acetaminophen (TYLENOL) 500 MG tablet Take 1,000 mg by mouth 3 (three) times daily.     aspirin EC 81 MG tablet Take 81 mg by mouth at bedtime.      buPROPion (WELLBUTRIN XL) 150 MG 24 hr tablet Take 1 tablet (150 mg total) by mouth daily. 30 tablet 5   busPIRone (BUSPAR) 7.5 MG tablet Take 1 tablet (7.5 mg total) by mouth 2 (two) times daily as needed. 180 tablet 3   Cholecalciferol (VITAMIN D3) 50 MCG (2000 UT) TABS Take 2,000 Units by mouth daily.     citalopram (CELEXA) 20 MG tablet TAKE ONE TABLET BY MOUTH ONCE DAILY 90 tablet 0   clobetasol ointment (TEMOVATE) 0.05 % Apply a pea sized amount BID for 2 weeks, then apply 2 x a week for the next month 60 g 0   ezetimibe (ZETIA) 10 MG tablet Take 1 tablet (10 mg total) by mouth daily. 90 tablet 3   meloxicam (MOBIC) 7.5 MG tablet Take 1 tablet (7.5 mg total) by mouth daily. 90 tablet 0   pantoprazole (PROTONIX) 20 MG tablet TAKE ONE TABLET BY MOUTH DAILY  90 tablet 0   rosuvastatin (CRESTOR) 40 MG tablet TAKE ONE TABLET BY MOUTH DAILY 90 tablet 0   vitamin B-12 (CYANOCOBALAMIN) 1000 MCG tablet Take 1,000 mcg by mouth daily.     No current facility-administered medications for this visit.     Objective:  BP 118/70   Pulse 69   Temp 98 F (36.7 C)   Ht '5\' 2"'$  (1.575 m)   Wt 155 lb 3.2 oz (70.4 kg)   LMP  (LMP Unknown)   SpO2 95%   BMI 28.39 kg/m  Gen: NAD, resting comfortably No oral lesions, tongue mildly dry CV: RRR no murmurs rubs or gallops Lungs: CTAB no crackles, wheeze, rhonchi Ext: no edema Skin: warm, dry     Assessment and Plan   #Caregiver burden- ongoing issues caring for husband with parkinsons. A lot of stress with long term planning  # Dysphagia S:gets sensation of food stuck in the back of her throat- food is not actually stuck  that she can tell. Does not happen with liquids. No particular foods  . Doesn't happen with just saliva- if doesn't think about it doesn't feel it.  -history of similar and was told "nerves" related over 50 years ago -known lichen planus in mouth- has topical for this -chief concern is possible cancer.  -has buspirone and takes 7.5 mg for anxiety- and helpful- does not recall trying for this issue.  -has history parathyroid surgery but no growth over thyroid or parathyroid -does not get choked  A/P: no oral lesions- she was mainly worried about lichen planus or malignancy- I cannot see down into her  throat but externally and from what I can see no abnormalities. She is a little on the dry side- encouraged increasing hydration. She could mention concern to GI- perhaps they could complete EGD as well- she will consider   #Hyperlipidemia/aortic atherosclerosis/ history of stroke- on aspirin 81 mg with LDL goal under 70 S: Compliant with rosuvastatin 40 mg and Zetia 10 mg.   -Aortic atherosclerosis noted CT 06/26/2017 for lung cancer screening.   Lab Results  Component Value Date   CHOL 146 08/23/2021   HDL 65.40 08/23/2021   LDLCALC 53 08/23/2021   LDLDIRECT 106 (H) 07/19/2016   TRIG 142.0 08/23/2021   CHOLHDL 2 08/23/2021  A/P:lipids have been very well controlled -still on asa for stroke preventoin   #Hypertension/CKD stage III S: Patient remains off lisinopril unfortunately blood pressure remains controlled  -GFR has been stable largely in the 40s lately- in the past had been in the 30s A/P: blood pressure still looks good withotu meds- continue to monitor  -has meloxicam for her back but discussed renal risks and could increase BP- uses severely sparingly and 3 days max  #Unstable gait S: Started January 2020.  Stat head CT to rule out subdural hematoma.  Initially also seem to have some lower extremity weakness and numbness.  There was concern for lumbar stenosis.  Symptoms improved  with time.  Ultimately did MRI of the brain to rule out prior cerebellar stroke-none was detected.  Did have prior lacunar stroke and patient was placed back on aspirin 81 mg .  Patient later saw Dr. Tomi Likens July 28, 2018-MRI of cervical spine without contrast was ordered.  There were some disc bulges and MRI of lumbar spine was also ordered September 2020-this showed spinal stenosis which was thought to be contributing to leg pain and balance issues.  Neurology offered referral to neurosurgery and  orthopedics- she  had declined   Ongoing issues- she wanted me to watch her walk to make sure not worse A/P: appears stable on exam- wants to hold off on neurology repeat visit- they had also mentioned neurosurgery or ortho visit with spinal stneosis. She would like to hold off  -wants to hold off on PT   #Anxiety/stress management S: Compliant with Celexa 20 mg daily , Wellbutrin '150mg'$  XR (reduced from '300mg'$  due to anxiety concern). Buspirone 5 mg BID has been helpful thankfully -Prior on xanax but had started to feel oversedated.  S: Compliant with Celexa 20 mg, Wellbutrin 300 mg extended release.   - in past sparing Xanax as needed for anxiety A/P: denies depression. Anxiety helped by going up to 7.5 mg buspirone- we will send in new rx due to mild poor control on lower dose- continue current medicationsotherwise  #lung cancer screening program- plans to call back to reschedule  # B12 deficiency S: Current treatment/medication (oral vs. IM): b12 otc  Lab Results  Component Value Date   VITAMINB12 430 07/20/2020  A/P: hopefully stable- update b12 today. Continue current meds for now   Recommended follow up: Return in about 4 months (around 04/26/2022) for followup or sooner if needed.Schedule b4 you leave. Future Appointments  Date Time Provider [place]  01/14/2022  3:40 PM Daryel November, MD [facility]   Lab/Order associations:   ICD-10-CM   1. Hyperlipidemia,  unspecified hyperlipidemia type  E78.5     2. Essential hypertension  I10     3. Stage 3 chronic kidney disease, unspecified whether stage 3a or 3b CKD (HCC)  N18.30 CBC with Differential/Platelet    Comprehensive metabolic panel    4. Vitamin B12 deficiency  E53.8 Vitamin B12      Meds ordered this encounter  Medications   busPIRone (BUSPAR) 7.5 MG tablet    Sig: Take 1 tablet (7.5 mg total) by mouth 2 (two) times daily as needed.    Dispense:  180 tablet    Refill:  3    Return precautions advised.  [name], MD

## 2021-12-25 NOTE — Patient Instructions (Addendum)
Health Maintenance Due  Topic Date Due   Lung Cancer Screening  08/25/2019  Call to schedule follow up  Please stop by lab before you go If you have mychart- we will send your results within 3 business days of [place] receiving them.  If you do not have mychart- we will call you about results within 5 business days of [place] receiving them.  *please also note that you will see labs on mychart as soon as they post. I will later go in and write notes on them- will say "notes from Dr. Yong Channel"   With sensation of dry mouth and like something is stuck- lets increase hydration as you are on the dry side but no oral lesions- could also mention to GI to see if they suggest endoscopy.  -if worsening symptoms let us know  Gait appeared stable- continue with cane for stability- you look great with it! Very elegant!   Recommended follow up: Return in about 4 months (around 04/26/2022) for followup or sooner if needed.Schedule b4 you leave.

## 2022-01-14 ENCOUNTER — Encounter: Payer: Self-pay | Admitting: Gastroenterology

## 2022-01-14 ENCOUNTER — Ambulatory Visit (INDEPENDENT_AMBULATORY_CARE_PROVIDER_SITE_OTHER): Payer: PPO | Admitting: Gastroenterology

## 2022-01-14 VITALS — BP 130/68 | HR 71 | Ht 64.0 in | Wt 159.0 lb

## 2022-01-14 DIAGNOSIS — Z860100 Personal history of colon polyps, unspecified: Secondary | ICD-10-CM

## 2022-01-14 DIAGNOSIS — K921 Melena: Secondary | ICD-10-CM | POA: Diagnosis not present

## 2022-01-14 DIAGNOSIS — Z9189 Other specified personal risk factors, not elsewhere classified: Secondary | ICD-10-CM

## 2022-01-14 NOTE — Patient Instructions (Signed)
_______________________________________________________  If you are age 78 or older, your body mass index should be between 23-30. Your Body mass index is 27.29 kg/m. If this is out of the aforementioned range listed, please consider follow up with your Primary Care Provider.  If you are age 87 or younger, your body mass index should be between 19-25. Your Body mass index is 27.29 kg/m. If this is out of the aformentioned range listed, please consider follow up with your Primary Care Provider.   Increase metamucil to 3 tablets a day.  We will call you when there is an opening at the hospital.  The [facility] providers would like to encourage you to use [facility] to communicate with providers for non-urgent requests or questions.  Due to long hold times on the telephone, sending your provider a message by [facility] may be a faster and more efficient way to get a response.  Please allow 48 business hours for a response.  Please remember that this is for non-urgent requests.   It was a pleasure to see you today!  Thank you for trusting me with your gastrointestinal care!    Scott E.Candis Schatz, MD

## 2022-01-14 NOTE — Progress Notes (Signed)
HPI : Crystal Carroll is a very pleasant 77 year old female with a history of CKD, aortic stenosis, anxiety and hypothyroid who is referred to [place] by Dr. [name] for recent painless hematochezia.  The patient was last seen by me in February of this year to discuss surveillance colonoscopy.  Given her age and history of low risk polyps on previous colonoscopies, it was thought that further polyp surveillance was not needed.  At that time, she had no symptoms. In the past few months, she has noted seeing bright red blood on the toilet paper.  She has noticed this only with passage of hard stools, but she has never seen this before.  She denies any other symptoms such as pain with the passage of stool, perianal itching, swelling or prolapse.  She denies other changes in bowel habits or problems with abdominal pain. She started taking Metamucil Gummies a few months ago and has noticed some improvement in the hard stools and straining.  Her weight has been stable.  As mentioned below, she was a notable to complete her bowel prep in January of last year due to significant nausea and vomiting.  She reports being able to complete previous bowel preps and 2009 in 2016.  She does not recall which bowel prep she used previously which was successful.     HPI: (Feb 27, 2021)  The patient had been scheduled for a surveillance colonoscopy in July 2022, but had her appointment moved to the hospital setting because of a history of a difficult airway.  Her colonoscopy was scheduled for August 9, but she had to cancel last minute due to a medical emergency with her husband.  The colonoscopy was rescheduled in the hospital for January 26, but she was unable to complete this because she has significant nausea and vomiting with the bowel prep. The patient denies any chronic GI symptoms.  She has regular bowel movements, denies problems with constipation, diarrhea or blood in her stool.  She has no family history of  colon cancer. She has had 2 previous colonoscopies.  On her index colonoscopy in 2009, she had 3 small polyps, largest 8 mm, all tubular adenomas.  On her surveillance colonoscopy in 2016, she had a 3 mm tubular adenoma and was recommended repeat in 5 years.   She has a history of aortic stenosis, which is asymptomatic and has been monitored with serial echocardiograms.  Otherwise, she denies any chronic cardiopulmonary comorbidities.  Past Medical History:  Diagnosis Date   Anxiety    Aortic atherosclerosis ([place])    Aortic valvar stenosis 11/2018   Mild, noted on ECHO   Arthritis    cervical neck   CKD (chronic kidney disease), stage III (HCC)    Stable   GERD (gastroesophageal reflux disease)    History of TIA (transient ischemic attack)    per patient, didn't know about it   Hyperlipidemia    Hyperparathyroidism ([place])    Hypertension    History of    Junctional rhythm    Osteoporosis    Palpitations    Thyroid nodule    2.0 cm solid nodule in the inferior left thyroid lobe      Past Surgical History:  Procedure Laterality Date   CERVICAL LAMINECTOMY  2004   Earle Gell   COLONOSCOPY  05/31/2014   Deatra Ina   ESOPHAGOGASTRODUODENOSCOPY     PARATHYROIDECTOMY Left 01/13/2019   Procedure: LEFT PARATHYROIDECTOMY;  Surgeon: Armandina Gemma, MD;  Location: [facility];  Service: General;  Laterality: Left;   THYROID LOBECTOMY Left 01/13/2019   Procedure: LEFT THYROID LOBECTOMY;  Surgeon: Armandina Gemma, MD;  Location: [facility];  Service: General;  Laterality: Left;   Family History  Problem Relation Age of Onset   Melanoma Mother 75       labia   Heart attack Father 22       smoker, rheumatic fever as child   Colon cancer Neg Hx    Colon polyps Neg Hx    Esophageal cancer Neg Hx    Rectal cancer Neg Hx    Stomach cancer Neg Hx    Social History   Tobacco Use   Smoking status: Former    Packs/day: 0.75    Years: 50.00    Total pack years: 37.50    Types: Cigarettes    Quit  date: 03/07/2013    Years since quitting: 8.8   Smokeless tobacco: Never  Vaping Use   Vaping Use: Never used  Substance Use Topics   Alcohol use: Yes    Comment: 1-2 daily spritzers per pt   Drug use: No   Current Outpatient Medications  Medication Sig Dispense Refill   acetaminophen (TYLENOL) 500 MG tablet Take 1,000 mg by mouth 3 (three) times daily.     aspirin EC 81 MG tablet Take 81 mg by mouth at bedtime.      buPROPion (WELLBUTRIN XL) 150 MG 24 hr tablet Take 1 tablet (150 mg total) by mouth daily. 30 tablet 5   busPIRone (BUSPAR) 7.5 MG tablet Take 1 tablet (7.5 mg total) by mouth 2 (two) times daily as needed. 180 tablet 3   Cholecalciferol (VITAMIN D3) 50 MCG (2000 UT) TABS Take 2,000 Units by mouth daily.     citalopram (CELEXA) 20 MG tablet TAKE ONE TABLET BY MOUTH ONCE DAILY 90 tablet 0   clobetasol ointment (TEMOVATE) 0.05 % Apply a pea sized amount BID for 2 weeks, then apply 2 x a week for the next month 60 g 0   ezetimibe (ZETIA) 10 MG tablet Take 1 tablet (10 mg total) by mouth daily. 90 tablet 3   meloxicam (MOBIC) 7.5 MG tablet Take 1 tablet (7.5 mg total) by mouth daily. 90 tablet 0   pantoprazole (PROTONIX) 20 MG tablet TAKE ONE TABLET BY MOUTH DAILY 90 tablet 0   rosuvastatin (CRESTOR) 40 MG tablet TAKE ONE TABLET BY MOUTH DAILY 90 tablet 0   vitamin B-12 (CYANOCOBALAMIN) 1000 MCG tablet Take 1,000 mcg by mouth daily.     No current facility-administered medications for this visit.   Allergies  Allergen Reactions   Codeine Nausea Only   Sulfamethoxazole     GI upset     Review of Systems: All systems reviewed and negative except where noted in HPI.    No results found.  Physical Exam: BP 130/68   Pulse 71   Ht '5\' 4"'$  (1.626 m)   Wt 159 lb (72.1 kg)   LMP  (LMP Unknown)   SpO2 96%   BMI 27.29 kg/m  Constitutional: Pleasant,well-developed, Caucasian female in no acute distress. HEENT: Normocephalic and atraumatic. Conjunctivae are normal. No  scleral icterus. Cardiovascular: Normal rate, regular rhythm.  Pulmonary/chest: Effort normal and breath sounds normal. No wheezing, rales or rhonchi. Abdominal: Soft, nondistended, nontender. Bowel sounds active throughout. There are no masses palpable. No hepatomegaly. Extremities: no edema Neurological: Alert and oriented to person place and time. Skin: Skin is warm and dry. No rashes noted. Psychiatric: Normal mood and affect. Behavior  is normal.  CBC    Component Value Date/Time   WBC 8.1 12/25/2021 1425   RBC 4.33 12/25/2021 1425   HGB 13.4 12/25/2021 1425   HCT 38.2 12/25/2021 1425   PLT 231.0 12/25/2021 1425   MCV 88.2 12/25/2021 1425   MCH 29.3 01/06/2019 1442   MCHC 35.1 12/25/2021 1425   RDW 13.5 12/25/2021 1425   LYMPHSABS 1.2 12/25/2021 1425   MONOABS 0.6 12/25/2021 1425   EOSABS 0.2 12/25/2021 1425   BASOSABS 0.0 12/25/2021 1425    CMP     Component Value Date/Time   NA 141 12/25/2021 1425   K 3.7 12/25/2021 1425   CL 105 12/25/2021 1425   CO2 27 12/25/2021 1425   GLUCOSE 97 12/25/2021 1425   BUN 15 12/25/2021 1425   CREATININE 0.93 12/25/2021 1425   CREATININE 1.50 (H) 05/22/2018 1513   CALCIUM 9.5 12/25/2021 1425   CALCIUM 10.7 (H) 07/26/2009 2346   PROT 6.7 12/25/2021 1425   ALBUMIN 4.5 12/25/2021 1425   AST 12 12/25/2021 1425   ALT 8 12/25/2021 1425   ALKPHOS 56 12/25/2021 1425   BILITOT 0.4 12/25/2021 1425   GFRNONAA 45 (L) 01/14/2019 0421   GFRAA 52 (L) 01/14/2019 0421     ASSESSMENT AND PLAN: 77 year old female with history of low risk polyps in 2009 in 2016, ongoing colon polyp surveillance not felt to be necessary given her age and lack of advanced polyps, now with new onset painless scant hematochezia.  She only notices bright red blood with passage of hard stools.  She is not anemic and has no other concerning symptoms.  Although her scant hematochezia is almost certainly hemorrhoidal in etiology, given the new onset of symptoms, I do think  a repeat colonoscopy is warranted at this point.  Because of her history of difficult intubation, her colonoscopy will need to be scheduled in the hospital setting.  Will place patient on waiting list and inform patient when we have an available hospital slot. Given her intolerance of her bowel prep previously, we will plan for Sutab as her bowel prep. I recommended she increase her Metamucil Gummies to 3 daily to further reduce hard stools.  Hematochezia - Colonoscopy (hospital setting due to hx of difficult intubation) - Sutab for bowel prep - Increase metamucil  The details, risks (including bleeding, perforation, infection, missed lesions, medication reactions and possible hospitalization or surgery if complications occur), benefits, and alternatives to colonoscopy with possible biopsy and possible polypectomy were discussed with the patient and she consents to proceed.   Lavelle Berland E. Candis Schatz, [facility]   Yong Channel Brayton Mars, MD

## 2022-01-24 ENCOUNTER — Other Ambulatory Visit: Payer: Self-pay

## 2022-01-24 DIAGNOSIS — K921 Melena: Secondary | ICD-10-CM

## 2022-01-24 MED ORDER — SUTAB 1479-225-188 MG PO TABS: ORAL_TABLET | ORAL | 0 refills | Status: DC

## 2022-02-14 ENCOUNTER — Encounter (HOSPITAL_COMMUNITY)
Admission: RE | Disposition: A | Discharge status: Home / Self Care | Payer: Self-pay | Source: Home / Self Care | Attending: Gastroenterology

## 2022-02-14 ENCOUNTER — Ambulatory Visit (HOSPITAL_COMMUNITY)
Admission: RE | Admit: 2022-02-14 | Discharge: 2022-02-14 | Disposition: A | Discharge status: Home / Self Care | Payer: PPO | Attending: Gastroenterology | Admitting: Gastroenterology

## 2022-02-14 ENCOUNTER — Ambulatory Visit (HOSPITAL_BASED_OUTPATIENT_CLINIC_OR_DEPARTMENT_OTHER): Payer: PPO | Admitting: Anesthesiology

## 2022-02-14 ENCOUNTER — Other Ambulatory Visit: Payer: Self-pay

## 2022-02-14 ENCOUNTER — Ambulatory Visit (HOSPITAL_COMMUNITY): Payer: PPO | Admitting: Anesthesiology

## 2022-02-14 ENCOUNTER — Encounter (HOSPITAL_COMMUNITY): Payer: Self-pay | Admitting: Gastroenterology

## 2022-02-14 DIAGNOSIS — D125 Benign neoplasm of sigmoid colon: Secondary | ICD-10-CM | POA: Insufficient documentation

## 2022-02-14 DIAGNOSIS — I129 Hypertensive chronic kidney disease with stage 1 through stage 4 chronic kidney disease, or unspecified chronic kidney disease: Secondary | ICD-10-CM | POA: Insufficient documentation

## 2022-02-14 DIAGNOSIS — K5730 Diverticulosis of large intestine without perforation or abscess without bleeding: Secondary | ICD-10-CM | POA: Insufficient documentation

## 2022-02-14 DIAGNOSIS — K921 Melena: Secondary | ICD-10-CM | POA: Insufficient documentation

## 2022-02-14 DIAGNOSIS — M1990 Unspecified osteoarthritis, unspecified site: Secondary | ICD-10-CM

## 2022-02-14 DIAGNOSIS — N183 Chronic kidney disease, stage 3 unspecified: Secondary | ICD-10-CM | POA: Insufficient documentation

## 2022-02-14 DIAGNOSIS — F419 Anxiety disorder, unspecified: Secondary | ICD-10-CM | POA: Diagnosis not present

## 2022-02-14 DIAGNOSIS — I10 Essential (primary) hypertension: Secondary | ICD-10-CM

## 2022-02-14 DIAGNOSIS — K640 First degree hemorrhoids: Secondary | ICD-10-CM | POA: Insufficient documentation

## 2022-02-14 DIAGNOSIS — Z79899 Other long term (current) drug therapy: Secondary | ICD-10-CM | POA: Insufficient documentation

## 2022-02-14 DIAGNOSIS — D126 Benign neoplasm of colon, unspecified: Secondary | ICD-10-CM | POA: Diagnosis present

## 2022-02-14 DIAGNOSIS — Z87891 Personal history of nicotine dependence: Secondary | ICD-10-CM | POA: Insufficient documentation

## 2022-02-14 DIAGNOSIS — K219 Gastro-esophageal reflux disease without esophagitis: Secondary | ICD-10-CM | POA: Diagnosis not present

## 2022-02-14 DIAGNOSIS — I700 Atherosclerosis of aorta: Secondary | ICD-10-CM | POA: Insufficient documentation

## 2022-02-14 DIAGNOSIS — D120 Benign neoplasm of cecum: Secondary | ICD-10-CM | POA: Insufficient documentation

## 2022-02-14 DIAGNOSIS — K635 Polyp of colon: Secondary | ICD-10-CM | POA: Diagnosis not present

## 2022-02-14 DIAGNOSIS — E785 Hyperlipidemia, unspecified: Secondary | ICD-10-CM | POA: Insufficient documentation

## 2022-02-14 DIAGNOSIS — D123 Benign neoplasm of transverse colon: Secondary | ICD-10-CM | POA: Insufficient documentation

## 2022-02-14 HISTORY — PX: POLYPECTOMY: SHX5525

## 2022-02-14 HISTORY — PX: COLONOSCOPY WITH PROPOFOL: SHX5780

## 2022-02-14 SURGERY — COLONOSCOPY WITH PROPOFOL
Anesthesia: Monitor Anesthesia Care

## 2022-02-14 MED ORDER — FENTANYL CITRATE (PF) 100 MCG/2ML IJ SOLN: 25.0000 ug | INTRAMUSCULAR | Status: DC | PRN

## 2022-02-14 MED ORDER — OXYCODONE HCL 5 MG/5ML PO SOLN: 5.0000 mg | Freq: Once | ORAL | Status: DC | PRN

## 2022-02-14 MED ORDER — PROPOFOL 10 MG/ML IV BOLUS
INTRAVENOUS | Status: DC | PRN
  Administered 2022-02-14: 20 mg via INTRAVENOUS
  Administered 2022-02-14 (×2): 10 mg via INTRAVENOUS
  Administered 2022-02-14 (×2): 20 mg via INTRAVENOUS
  Administered 2022-02-14 (×2): 10 mg via INTRAVENOUS

## 2022-02-14 MED ORDER — OXYCODONE HCL 5 MG PO TABS: 5.0000 mg | ORAL_TABLET | Freq: Once | ORAL | Status: DC | PRN

## 2022-02-14 MED ORDER — LACTATED RINGERS IV SOLN: INTRAVENOUS | Status: DC

## 2022-02-14 MED ORDER — PROPOFOL 500 MG/50ML IV EMUL
INTRAVENOUS | Status: DC | PRN
  Administered 2022-02-14: 100 ug/kg/min via INTRAVENOUS

## 2022-02-14 MED ORDER — ONDANSETRON HCL 4 MG/2ML IJ SOLN: 4.0000 mg | Freq: Four times a day (QID) | INTRAMUSCULAR | Status: DC | PRN

## 2022-02-14 SURGICAL SUPPLY — 22 items

## 2022-02-14 NOTE — H&P (Signed)
[facility] History and Physical   Primary Care Physician:  Marin Olp, MD   Reason for Procedure:   Hematochezia  Plan:    Colonoscopy     HPI: Crystal Carroll is a 77 y.o. female undergoing colonoscopy because of new onset intermittent painless hematochezia.  She has no family history of colon cancer and no chronic GI symptoms.  She has a history of nonadvanced polyps in the past, most recently with a 3 mm tubular adenoma in 2016.  Colonoscopy performed in the hospital setting because of history of difficult airway.   Past Medical History:  Diagnosis Date   Anxiety    Aortic atherosclerosis ([place])    Aortic valvar stenosis 11/2018   Mild, noted on ECHO   Arthritis    cervical neck   CKD (chronic kidney disease), stage III (HCC)    Stable   GERD (gastroesophageal reflux disease)    History of TIA (transient ischemic attack)    per patient, didn't know about it   Hyperlipidemia    Hyperparathyroidism ([place])    Hypertension    History of    Junctional rhythm    Osteoporosis    Palpitations    Thyroid nodule    2.0 cm solid nodule in the inferior left thyroid lobe     Past Surgical History:  Procedure Laterality Date   CERVICAL LAMINECTOMY  2004   Earle Gell   COLONOSCOPY  05/31/2014   Deatra Ina   ESOPHAGOGASTRODUODENOSCOPY     PARATHYROIDECTOMY Left 01/13/2019   Procedure: LEFT PARATHYROIDECTOMY;  Surgeon: Armandina Gemma, MD;  Location: [facility];  Service: General;  Laterality: Left;   THYROID LOBECTOMY Left 01/13/2019   Procedure: LEFT THYROID LOBECTOMY;  Surgeon: Armandina Gemma, MD;  Location: [facility];  Service: General;  Laterality: Left;    Prior to Admission medications   Medication Sig Start Date End Date Taking? Authorizing Provider  acetaminophen (TYLENOL) 500 MG tablet Take 1,000 mg by mouth 2 (two) times daily.   Yes [provider]  ascorbic acid (VITAMIN C) 500 MG tablet Take 500 mg by mouth in the morning.   Yes [provider]  aspirin EC 81 MG tablet Take 81 mg by mouth at bedtime.    Yes [provider]  buPROPion (WELLBUTRIN XL) 150 MG 24 hr tablet Take 1 tablet (150 mg total) by mouth daily. 08/13/21  Yes Marin Olp, MD  busPIRone (BUSPAR) 7.5 MG tablet Take 1 tablet (7.5 mg total) by mouth 2 (two) times daily as needed. Patient taking differently: Take 7.5 mg by mouth daily. Vanspar 12/25/21  Yes Marin Olp, MD  Cholecalciferol (VITAMIN D3) 50 MCG (2000 UT) TABS Take 2,000 Units by mouth in the morning.   Yes [provider]  citalopram (CELEXA) 20 MG tablet TAKE ONE TABLET BY MOUTH ONCE DAILY 12/18/21  Yes Marin Olp, MD  clobetasol ointment (TEMOVATE) 0.05 % Apply a pea sized amount BID for 2 weeks, then apply 2 x a week for the next month 12/18/21  Yes Salvadore Dom, MD  ezetimibe (ZETIA) 10 MG tablet Take 1 tablet (10 mg total) by mouth daily. 05/17/21  Yes Marin Olp, MD  meloxicam (MOBIC) 7.5 MG tablet Take 1 tablet (7.5 mg total) by mouth daily. Patient taking differently: Take 7.5 mg by mouth daily as needed for pain. 10/22/21  Yes Glennon Mac, DO  pantoprazole (PROTONIX) 20 MG tablet TAKE ONE TABLET BY MOUTH DAILY 12/18/21  Yes [name]  O, MD  rosuvastatin (CRESTOR) 40 MG tablet TAKE ONE TABLET BY MOUTH DAILY Patient taking differently: Take 40 mg by mouth at bedtime. 11/28/21  Yes Marin Olp, MD  Sodium Sulfate-Mag Sulfate-KCl (SUTAB) [phone] MG TABS Take as directed on colonoscopy instructions 01/24/22  Yes Daryel November, MD  TURMERIC PO Take 1,500 mg by mouth daily.   Yes [provider]  vitamin B-12 (CYANOCOBALAMIN) 1000 MCG tablet Take 1,000 mcg by mouth in the morning.   Yes [provider]    Current Facility-Administered Medications  Medication Dose Route Frequency Provider Last Rate Last Admin   lactated ringers infusion   Intravenous Continuous Daryel November, MD 10 mL/hr at  02/14/22 0823 New Bag at 02/14/22 7017   Facility-Administered Medications Ordered in Other Encounters  Medication Dose Route Frequency Provider Last Rate Last Admin   propofol (DIPRIVAN) 10 mg/mL bolus/IV push   Intravenous Anesthesia Intra-op Mariea Clonts, CRNA   10 mg at 02/14/22 7939   propofol (DIPRIVAN) 500 MG/50ML infusion   Intravenous Continuous PRN Mariea Clonts, CRNA 41.64 mL/hr at 02/14/22 0926 100 mcg/kg/min at 02/14/22 0926    Allergies as of 01/24/2022 - Review Complete 01/14/2022  Allergen Reaction Noted   Codeine Nausea Only 04/15/2017   Sulfamethoxazole  07/10/2006    Family History  Problem Relation Age of Onset   Melanoma Mother 38       labia   Heart attack Father 2       smoker, rheumatic fever as child   Colon cancer Neg Hx    Colon polyps Neg Hx    Esophageal cancer Neg Hx    Rectal cancer Neg Hx    Stomach cancer Neg Hx     Social History   Socioeconomic History   Marital status: Married    Spouse name: Brad   Number of children: 1   Years of education: 14   Highest education level: Associate degree: academic program  Occupational History   Occupation: Retired  Tobacco Use   Smoking status: Former    Packs/day: 0.75    Years: 50.00    Total pack years: 37.50    Types: Cigarettes    Quit date: 03/07/2013    Years since quitting: 8.9   Smokeless tobacco: Never  Vaping Use   Vaping Use: Never used  Substance and Sexual Activity   Alcohol use: Yes    Comment: 1-2 daily spritzers per pt   Drug use: No   Sexual activity: Not on file  Other Topics Concern   Not on file  Social History Narrative   Married to [employer] (patient) in 1981. 2nd marriage-1 child with 1 adopted grandchild.       Retired as Statistician.       Hobbies: antiques, former Firefighter      Exercise: none currently.       Patient is right-handed. She lives with her husband in a two level home. She does not exercise.      Social  Determinants of Health   Financial Resource Strain: Low Risk  (11/19/2021)   Overall Financial Resource Strain (CARDIA)    Difficulty of Paying Living Expenses: Not hard at all  Food Insecurity: No Food Insecurity (11/19/2021)   Hunger Vital Sign    Worried About Running Out of Food in the Last Year: Never true    Ran Out of Food in the Last Year: Never true  Transportation Needs: No Transportation Needs (11/19/2021)  PRAPARE - Hydrologist (Medical): No    Lack of Transportation (Non-Medical): No  Physical Activity: Inactive (11/19/2021)   Exercise Vital Sign    Days of Exercise per Week: 0 days    Minutes of Exercise per Session: 0 min  Stress: No Stress Concern Present (11/19/2021)   [place]    Feeling of Stress : Not at all  Social Connections: Moderately Integrated (11/19/2021)   Social Connection and Isolation Panel [NHANES]    Frequency of Communication with Friends and Family: More than three times a week    Frequency of Social Gatherings with Friends and Family: More than three times a week    Attends Religious Services: Never    Marine scientist or Organizations: Yes    Attends Archivist Meetings: 1 to 4 times per year    Marital Status: Married  Human resources officer Violence: Not At Risk (11/19/2021)   Humiliation, Afraid, Rape, and Kick questionnaire    Fear of Current or Ex-Partner: No    Emotionally Abused: No    Physically Abused: No    Sexually Abused: No    Review of Systems:  All other review of systems negative except as mentioned in the HPI.  Physical Exam: Vital signs BP 134/85   Pulse 85   Temp 98.7 F (37.1 C) (Oral)   Resp 13   Ht '5\' 4"'$  (1.626 m)   Wt 69.4 kg   LMP  (LMP Unknown)   BMI 26.26 kg/m   General:   Alert,  Well-developed, well-nourished, pleasant and cooperative in NAD Airway:  Mallampati 2 Lungs:  Clear throughout to  auscultation.   Heart:  Regular rate and rhythm; no murmurs, clicks, rubs,  or gallops. Abdomen:  Soft, nontender and nondistended. Normal bowel sounds.   Neuro/Psych:  Normal mood and affect. A and O x 3   Nehemyah Foushee E. Candis Schatz, MD [facility]

## 2022-02-14 NOTE — Transfer of Care (Signed)
Immediate Anesthesia Transfer of Care Note  Patient: Crystal Carroll  Procedure(s) Performed: COLONOSCOPY WITH PROPOFOL POLYPECTOMY  Patient Location: PACU  Anesthesia Type:MAC  Level of Consciousness: awake, alert , and oriented  Airway & Oxygen Therapy: Patient Spontanous Breathing  Post-op Assessment: Report given to RN and Post -op Vital signs reviewed and stable  Post vital signs: Reviewed and stable  Last Vitals:  Vitals Value Taken Time  BP    Temp    Pulse 68 02/14/22 1014  Resp 19 02/14/22 1014  SpO2 95 % 02/14/22 1014  Vitals shown include unvalidated device data.  Last Pain:  Vitals:   02/14/22 0816  TempSrc: Oral  PainSc: 0-No pain         Complications: No notable events documented.

## 2022-02-14 NOTE — Op Note (Signed)
[facility] Patient Name: Crystal Carroll Procedure Date : 02/14/2022 MRN: [id number] Attending MD: Gladstone Pih. Candis Schatz , MD, [id number] Date of Birth: January 07, 1946 CSN: [id number] Age: 77 Admit Type: Outpatient Procedure:                Colonoscopy Indications:              Hematochezia Providers:                Nicki Reaper E. Candis Schatz, MD, Dulcy Fanny, Faustina                            Mbumina, Technician Referring MD:              Medicines:                Monitored Anesthesia Care Complications:            No immediate complications. Estimated Blood Loss:     Estimated blood loss was minimal. Procedure:                Pre-Anesthesia Assessment:                           - Prior to the procedure, a History and Physical                            was performed, and patient medications and                            allergies were reviewed. The patient's tolerance of                            previous anesthesia was also reviewed. The risks                            and benefits of the procedure and the sedation                            options and risks were discussed with the patient.                            All questions were answered, and informed consent                            was obtained. Prior Anticoagulants: The patient has                            taken no anticoagulant or antiplatelet agents. ASA                            Grade Assessment: II - A patient with mild systemic                            disease. After reviewing the risks and benefits,  the patient was deemed in satisfactory condition to                            undergo the procedure.                           After obtaining informed consent, the colonoscope                            was passed under direct vision. Throughout the                            procedure, the patient's blood pressure, pulse, and                            oxygen saturations  were monitored continuously. The                            CF-HQ190L ([id number]) Olympus colonoscope was                            introduced through the anus and advanced to the the                            cecum, identified by appendiceal orifice and                            ileocecal valve. The colonoscopy was somewhat                            difficult due to multiple diverticula in the colon.                            Successful completion of the procedure was aided by                            using manual pressure. The patient tolerated the                            procedure well. The quality of the bowel                            preparation was good. The ileocecal valve,                            appendiceal orifice, and rectum were photographed.                            The bowel preparation used was SUTAB via split dose                            instruction. Scope In: 9:38:02 AM Scope Out: 10:07:18 AM Scope Withdrawal Time: 0 hours 19 minutes 29 seconds  Total Procedure Duration: 0 hours 29 minutes 16  seconds  Findings:      The perianal and digital rectal examinations were normal. Pertinent       negatives include normal sphincter tone and no palpable rectal lesions.      Two sessile polyps were found in the cecum. The polyps were 3 mm in       size. These polyps were removed with a cold snare. Resection and       retrieval were complete. The pathology specimen was placed into Bottle       Number 1. Estimated blood loss was minimal.      Three sessile polyps were found in the transverse colon. The polyps were       2 to 7 mm in size. These polyps were removed with a cold snare.       Resection and retrieval were complete. The pathology specimen was placed       into Bottle Number 1. Estimated blood loss was minimal.      A 4 mm polyp was found in the sigmoid colon. The polyp was flat. The       polyp was removed with a cold snare. Resection and retrieval were        complete. The pathology specimen was placed into Bottle Number 2.       Estimated blood loss was minimal.      Many large-mouthed and small-mouthed diverticula were found in the       sigmoid colon and descending colon. There was narrowing of the colon in       association with the diverticular opening.      The exam was otherwise normal throughout the examined colon.      Non-bleeding internal hemorrhoids were found during retroflexion. The       hemorrhoids were Grade I (internal hemorrhoids that do not prolapse).      No additional abnormalities were found on retroflexion. Impression:               - Two 3 mm polyps in the cecum, removed with a cold                            snare. Resected and retrieved.                           - Three 2 to 7 mm polyps in the transverse colon,                            removed with a cold snare. Resected and retrieved.                           - One 4 mm polyp in the sigmoid colon, removed with                            a cold snare. Resected and retrieved.                           - Severe diverticulosis in the sigmoid colon and in                            the descending colon. There was narrowing of the  colon in association with the diverticular opening.                           - Non-bleeding internal hemorrhoids. This is the                            source of the patient's hematochezia. Moderate Sedation:      N/A Recommendation:           - Patient has a contact number available for                            emergencies. The signs and symptoms of potential                            delayed complications were discussed with the                            patient. Return to normal activities tomorrow.                            Written discharge instructions were provided to the                            patient.                           - Resume previous diet.                           - Continue  present medications.                           - Await pathology results.                           - Repeat colonoscopy (date not yet determined) for                            surveillance based on pathology results.                           - Given patient's age and small polyp size, it                            would also be reasonable not to pursue further                            surveillance.                           - Recommend high fiber diet/daily fiber supplement                            to reduce risk of diverticular complications. Procedure Code(s):        --- Professional ---  45385, Colonoscopy, flexible; with removal of                            tumor(s), polyp(s), or other lesion(s) by snare                            technique Diagnosis Code(s):        --- Professional ---                           D12.0, Benign neoplasm of cecum                           D12.3, Benign neoplasm of transverse colon (hepatic                            flexure or splenic flexure)                           D12.5, Benign neoplasm of sigmoid colon                           K64.0, First degree hemorrhoids                           K92.1, Melena (includes Hematochezia)                           K57.30, Diverticulosis of large intestine without                            perforation or abscess without bleeding CPT copyright 2022 American Medical Association. All rights reserved. The codes documented in this report are preliminary and upon coder review may  be revised to meet current compliance requirements. Elouise Divelbiss E. Candis Schatz, MD 02/14/2022 10:17:37 AM This report has been signed electronically. Number of Addenda: 0

## 2022-02-14 NOTE — Anesthesia Preprocedure Evaluation (Signed)
Anesthesia Evaluation  Patient identified by MRN, date of birth, ID band Patient awake    Reviewed: Allergy & Precautions, H&P , NPO status , Patient's Chart, lab work & pertinent test results  Airway Mallampati: II   Neck ROM: full    Dental   Pulmonary former smoker   breath sounds clear to auscultation       Cardiovascular hypertension,  Rhythm:regular Rate:Normal     Neuro/Psych  PSYCHIATRIC DISORDERS Anxiety        GI/Hepatic ,GERD  ,,  Endo/Other    Renal/GU Renal InsufficiencyRenal disease     Musculoskeletal  (+) Arthritis ,    Abdominal   Peds  Hematology   Anesthesia Other Findings   Reproductive/Obstetrics                             Anesthesia Physical Anesthesia Plan  ASA: 3  Anesthesia Plan: MAC   Post-op Pain Management:    Induction: Intravenous  PONV Risk Score and Plan: 2 and Propofol infusion and Treatment may vary due to age or medical condition  Airway Management Planned: Nasal Cannula  Additional Equipment:   Intra-op Plan:   Post-operative Plan:   Informed Consent: I have reviewed the patients History and Physical, chart, labs and discussed the procedure including the risks, benefits and alternatives for the proposed anesthesia with the patient or authorized representative who has indicated his/her understanding and acceptance.     Dental advisory given  Plan Discussed with: CRNA, Anesthesiologist and Surgeon  Anesthesia Plan Comments:        Anesthesia Quick Evaluation

## 2022-02-14 NOTE — Anesthesia Procedure Notes (Signed)
Procedure Name: MAC Date/Time: 02/14/2022 9:22 AM  Performed by: Mariea Clonts, CRNAPre-anesthesia Checklist: Patient identified, Emergency Drugs available, Suction available, Patient being monitored and Timeout performed Patient Re-evaluated:Patient Re-evaluated prior to induction Oxygen Delivery Method: Simple face mask

## 2022-02-15 LAB — SURGICAL PATHOLOGY

## 2022-02-15 NOTE — Anesthesia Postprocedure Evaluation (Signed)
Anesthesia Post Note  Patient: Katie A Ternes  Procedure(s) Performed: COLONOSCOPY WITH PROPOFOL POLYPECTOMY     Patient location during evaluation: Endoscopy Anesthesia Type: MAC Level of consciousness: awake and alert Pain management: pain level controlled Vital Signs Assessment: post-procedure vital signs reviewed and stable Respiratory status: spontaneous breathing, nonlabored ventilation, respiratory function stable and patient connected to nasal cannula oxygen Cardiovascular status: stable and blood pressure returned to baseline Postop Assessment: no apparent nausea or vomiting Anesthetic complications: no   No notable events documented.  Last Vitals:  Vitals:   02/14/22 1015 02/14/22 1030  BP: 138/88 (!) 146/92  Pulse: 68 65  Resp: 19 20  Temp: (!) 36.4 C 36.4 C  SpO2: 95% 97%    Last Pain:  Vitals:   02/14/22 1030  TempSrc:   PainSc: 0-No pain                 Petula Rotolo S

## 2022-02-18 ENCOUNTER — Encounter (HOSPITAL_COMMUNITY): Payer: Self-pay | Admitting: Gastroenterology

## 2022-02-18 ENCOUNTER — Ambulatory Visit: Payer: PPO | Admitting: Family Medicine

## 2022-02-20 NOTE — Progress Notes (Signed)
Crystal Carroll,  Five of the six polyps removed were precancerous polyps, meaning that they would have had the potential to grow into cancer had they not been removed.  All of the polyps were small in size and overall, very low risk.   Our guidelines typically recommend a repeat colonoscopy in 3 years in this scenario, but colon cancer screening and polyp surveillance after age 78 is performed on a case by case basis.  Given that you would be 77 years old by the time you would need another colonoscopy, and the lack of high-risk polyps found recently and on past colonoscopies, I do not feel strongly that you need another colonoscopy.  As you are in good health, if you wanted to consider another colonoscopy at age 46, I would be happy to discuss that with you at that time.

## 2022-02-22 ENCOUNTER — Other Ambulatory Visit: Payer: Self-pay | Admitting: Family Medicine

## 2022-03-11 ENCOUNTER — Encounter: Payer: Self-pay | Admitting: Family Medicine

## 2022-03-11 ENCOUNTER — Ambulatory Visit (INDEPENDENT_AMBULATORY_CARE_PROVIDER_SITE_OTHER): Payer: PPO | Admitting: Family Medicine

## 2022-03-11 VITALS — BP 128/81 | HR 66 | Temp 98.2°F | Ht 64.0 in | Wt 157.6 lb

## 2022-03-11 DIAGNOSIS — R002 Palpitations: Secondary | ICD-10-CM

## 2022-03-11 DIAGNOSIS — F411 Generalized anxiety disorder: Secondary | ICD-10-CM | POA: Diagnosis not present

## 2022-03-11 DIAGNOSIS — I10 Essential (primary) hypertension: Secondary | ICD-10-CM | POA: Diagnosis not present

## 2022-03-11 NOTE — Progress Notes (Signed)
   Crystal Carroll is a 77 y.o. female who presents today for an office visit.  Assessment/Plan:  Palpitations EKG today with NSR. Based on history sounds like having ectopic beats.  She did have cardiac monitor few years ago that showed a junctional rhythm and she has followed with cardiology in the past.  We did discuss repeating Holter monitor however would be reasonable to have her follow back up with cardiology at this point. She is not currently having any red flag signs or symptoms.  She has been under a lot of stress recently and is also consuming quite a bit of caffeine which is likely contributing.  We briefly did discuss potential treatment options including beta blocker however we will defer this to cardiology.  Stress / GAD Likely contributing to above. She is currently on Celexa 20 mg daily.  Takes BuSpar 7.5 mg twice daily as needed.  Advised her to follow-up with PCP soon to discuss ongoing management.  Essential Hypertension At goal today off antihypertensives.  May benefit from adding on beta-blocker as above to help with palpitations.    Subjective:  HPI:  Patient here with concern for irregular heart rhythm. She has caregivers in her house taking care of her husband.  She has been feeling a fluttering sensation in her chest and she asked them to check her. It was reportedly found that she was having "skipped beat." She does admit she is taking a lot of caffiene and is under a lot of stress. Her husband has progressive parkinson. One of her friends was also recently killed after being hit by a car. She feels like her palpitations have been has going on for a week or two.  She does have a cardiologist but has not seen them for a couple of years.  No chest pain.  No shortness of breath.  Symptoms occur randomly.  No obvious aggravating or alleviating factors.  No exertional symptoms.       Objective:  Physical Exam: BP 128/81   Pulse 66   Temp 98.2 F (36.8 C) (Temporal)    Ht '5\' 4"'$  (1.626 m)   Wt 157 lb 9.6 oz (71.5 kg)   LMP  (LMP Unknown)   SpO2 95%   BMI 27.05 kg/m   Gen: No acute distress, resting comfortably CV: Regular rate and rhythm with no murmurs appreciated Pulm: Normal work of breathing, clear to auscultation bilaterally with no crackles, wheezes, or rhonchi Neuro: Grossly normal, moves all extremities Psych: Normal affect and thought content  EKG: NSR.  No ectopic beats.  No ischemic changes.  Time Spent: 40 minutes of total time was spent on the date of the encounter performing the following actions: chart review prior to seeing the patient, obtaining history, reviewing past visits with cardiology and prior cardiac work up, performing a medically necessary exam, counseling on the treatment plan, placing orders, and documenting in our EHR.        Algis Greenhouse. Jerline Pain, MD 03/11/2022 2:38 PM

## 2022-03-11 NOTE — Patient Instructions (Addendum)
It was very nice to see you today!  Your EKG today is normal.  I think you are probably having premature beats. Please call to schedule an appointment with cardiologist soon.   Take care, Dr Jerline Pain  PLEASE NOTE:  If you had any lab tests, please let us know if you have not heard back within a few days. You may see your results on mychart before we have a chance to review them but we will give you a call once they are reviewed by [place].   If we ordered any referrals today, please let us know if you have not heard from their office within the next week.   If you had any urgent prescriptions sent in today, please check with the pharmacy within an hour of our visit to make sure the prescription was transmitted appropriately.   Please try these tips to maintain a healthy lifestyle:  Eat at least 3 REAL meals and 1-2 snacks per day.  Aim for no more than 5 hours between eating.  If you eat breakfast, please do so within one hour of getting up.   Each meal should contain half fruits/vegetables, one quarter protein, and one quarter carbs (no bigger than a computer mouse)  Cut down on sweet beverages. This includes juice, soda, and sweet tea.   Drink at least 1 glass of water with each meal and aim for at least 8 glasses per day  Exercise at least 150 minutes every week.

## 2022-03-15 ENCOUNTER — Other Ambulatory Visit: Payer: Self-pay | Admitting: Family Medicine

## 2022-03-21 DIAGNOSIS — Q386 Other congenital malformations of mouth: Secondary | ICD-10-CM | POA: Diagnosis not present

## 2022-03-21 DIAGNOSIS — L820 Inflamed seborrheic keratosis: Secondary | ICD-10-CM | POA: Diagnosis not present

## 2022-03-21 DIAGNOSIS — D225 Melanocytic nevi of trunk: Secondary | ICD-10-CM | POA: Diagnosis not present

## 2022-03-23 ENCOUNTER — Other Ambulatory Visit: Payer: Self-pay | Admitting: Family Medicine

## 2022-04-24 ENCOUNTER — Ambulatory Visit (INDEPENDENT_AMBULATORY_CARE_PROVIDER_SITE_OTHER): Payer: PPO | Admitting: Obstetrics and Gynecology

## 2022-04-24 ENCOUNTER — Encounter: Payer: Self-pay | Admitting: Obstetrics and Gynecology

## 2022-04-24 VITALS — BP 126/74 | HR 73 | Wt 162.0 lb

## 2022-04-24 DIAGNOSIS — N9089 Other specified noninflammatory disorders of vulva and perineum: Secondary | ICD-10-CM

## 2022-04-24 NOTE — Progress Notes (Signed)
GYNECOLOGY  VISIT   HPI: 77 y.o.   Married White or Caucasian Not Hispanic or Latino  female   G1P1001 with No LMP recorded (lmp unknown). Patient is postmenopausal.   here for  She has a place on her right labia that is raw. Symptoms started 2 days ago. Better today.   Vulvar biopsy from 12/23 with possible lichen sclerosis.   Under lots of stress, her husband has Parkinson's and is going to need to go to skilled nursing. She has been caring for him at home with caregivers.   GYNECOLOGIC HISTORY: No LMP recorded (lmp unknown). Patient is postmenopausal. 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 02/24/2019   Hyperparathyroidism, primary 07/17/2018   History of lacunar cerebrovascular accident (CVA) 06/18/2018   Osteoporosis 03/13/2018   Patellofemoral arthritis 03/05/2018   DDD (degenerative disc disease), lumbar 03/05/2018   Unstable gait 01/20/2018   Aortic atherosclerosis 06/12/2017   Palpitations 05/12/2017   Vitamin B12 deficiency 05/12/2017   Cervical radiculopathy 04/03/2016   Caregiver burden 04/03/2016   Essential tremor 04/18/2015   Benign neoplasm of colon 06/14/2014   Heavy alcohol use 04/14/2014   H/O cold sores 10/15/2013   CKD (chronic kidney disease), stage III 10/15/2013   Former smoker 12/24/2006   Hyperlipidemia 05/26/2006   GAD (generalized anxiety disorder) 05/26/2006   Essential hypertension 05/26/2006   GERD 05/26/2006    Past Medical History:  Diagnosis Date   Anxiety     Aortic atherosclerosis (HCC)    Aortic valvar stenosis 11/2018   Mild, noted on ECHO   Arthritis    cervical neck   CKD (chronic kidney disease), stage III (HCC)    Stable   GERD (gastroesophageal reflux disease)    History of TIA (transient ischemic attack)    per patient, didn't know about it   Hyperlipidemia    Hyperparathyroidism (HCC)    Hypertension    History of    Junctional rhythm    Osteoporosis    Palpitations    Thyroid nodule    2.0 cm solid nodule in the inferior left thyroid lobe     Past Surgical History:  Procedure Laterality Date   CERVICAL LAMINECTOMY  2004   Delma Officer   COLONOSCOPY  05/31/2014   Arlyce Dice   COLONOSCOPY WITH PROPOFOL N/A 02/14/2022   Procedure: COLONOSCOPY WITH PROPOFOL;  Surgeon: Jenel Lucks, MD;  Location: [facility] ENDOSCOPY;  Service: Gastroenterology;  Laterality: N/A;   ESOPHAGOGASTRODUODENOSCOPY     PARATHYROIDECTOMY Left 01/13/2019   Procedure: LEFT PARATHYROIDECTOMY;  Surgeon: Darnell Level, MD;  Location: [facility] ORS;  Service: General;  Laterality: Left;   POLYPECTOMY  02/14/2022   Procedure: POLYPECTOMY;  Surgeon: Jenel Lucks, MD;  Location: [facility]COPY;  Service: Gastroenterology;;  THYROID LOBECTOMY Left 01/13/2019   Procedure: LEFT THYROID LOBECTOMY;  Surgeon: Darnell Level, MD;  Location: [facility] ORS;  Service: General;  Laterality: Left;    Current Outpatient Medications  Medication Sig Dispense Refill   acetaminophen (TYLENOL) 500 MG tablet Take 1,000 mg by mouth 2 (two) times daily.     ascorbic acid (VITAMIN C) 500 MG tablet Take 500 mg by mouth in the morning.     aspirin EC 81 MG tablet Take 81 mg by mouth at bedtime.      buPROPion (WELLBUTRIN XL) 150 MG 24 hr tablet TAKE ONE TABLET BY MOUTH DAILY 30 tablet 5   busPIRone (BUSPAR) 7.5 MG tablet Take 1 tablet (7.5 mg total) by mouth 2 (two) times daily as needed. (Patient taking differently: Take 7.5 mg by mouth daily. Vanspar) 180 tablet 3   Cholecalciferol (VITAMIN D3)  50 MCG (2000 UT) TABS Take 2,000 Units by mouth in the morning.     citalopram (CELEXA) 20 MG tablet TAKE ONE TABLET BY MOUTH ONCE DAILY 90 tablet 0   clobetasol ointment (TEMOVATE) 0.05 % Apply a pea sized amount BID for 2 weeks, then apply 2 x a week for the next month 60 g 0   ezetimibe (ZETIA) 10 MG tablet Take 1 tablet (10 mg total) by mouth daily. 90 tablet 3   meloxicam (MOBIC) 7.5 MG tablet Take 1 tablet (7.5 mg total) by mouth daily. (Patient taking differently: Take 7.5 mg by mouth daily as needed for pain.) 90 tablet 0   pantoprazole (PROTONIX) 20 MG tablet TAKE ONE TABLET BY MOUTH DAILY 90 tablet 0   rosuvastatin (CRESTOR) 40 MG tablet TAKE ONE TABLET BY MOUTH DAILY 90 tablet 0   Sodium Sulfate-Mag Sulfate-KCl (SUTAB) 872 [id number] MG TABS Take as directed on colonoscopy instructions 24 tablet 0   TURMERIC PO Take 1,500 mg by mouth daily.     vitamin B-12 (CYANOCOBALAMIN) 1000 MCG tablet Take 1,000 mcg by mouth in the morning.     No current facility-administered medications for this visit.     ALLERGIES: Codeine and Sulfamethoxazole  Family History  Problem Relation Age of Onset   Melanoma Mother 67       labia   Heart attack Father 37       smoker, rheumatic fever as child   Colon cancer Neg Hx    Colon polyps Neg Hx    Esophageal cancer Neg Hx    Rectal cancer Neg Hx    Stomach cancer Neg Hx     Social History   Socioeconomic History   Marital status: Married    Spouse name: Brad   Number of children: 1   Years of education: 14   Highest education level: Associate degree: academic program  Occupational History   Occupation: Retired  Tobacco Use   Smoking status: Former    Packs/day: 0.75    Years: 50.00    Additional pack years: 0.00    Total pack years: 37.50    Types: Cigarettes    Quit date: 03/07/2013    Years since quitting: 9.1   Smokeless tobacco: Never  Vaping Use   Vaping Use: Never used  Substance and Sexual Activity   Alcohol use: Yes     Comment: 1-2 daily spritzers per pt   Drug use: No   Sexual activity: Not on file  Other Topics Concern   Not on file  Social History Narrative   Married to [employer] (patient) in 54. 2nd marriage-1  child with 1 adopted grandchild.       Retired as Psychologist, forensic.       Hobbies: antiques, former Armed forces operational officer      Exercise: none currently.       Patient is right-handed. She lives with her husband in a two level home. She does not exercise.      Social Determinants of Health   Financial Resource Strain: Low Risk  (11/19/2021)   Overall Financial Resource Strain (CARDIA)    Difficulty of Paying Living Expenses: Not hard at all  Food Insecurity: No Food Insecurity (11/19/2021)   Hunger Vital Sign    Worried About Running Out of Food in the Last Year: Never true    Ran Out of Food in the Last Year: Never true  Transportation Needs: No Transportation Needs (11/19/2021)   PRAPARE - Administrator, Civil Service (Medical): No    Lack of Transportation (Non-Medical): No  Physical Activity: Inactive (11/19/2021)   Exercise Vital Sign    Days of Exercise per Week: 0 days    Minutes of Exercise per Session: 0 min  Stress: No Stress Concern Present (11/19/2021)   [employer] of Occupational Health - Occupational Stress Questionnaire    Feeling of Stress : Not at all  Social Connections: Moderately Integrated (11/19/2021)   Social Connection and Isolation Panel [NHANES]    Frequency of Communication with Friends and Family: More than three times a week    Frequency of Social Gatherings with Friends and Family: More than three times a week    Attends Religious Services: Never    Database administrator or Organizations: Yes    Attends Banker Meetings: 1 to 4 times per year    Marital Status: Married  Catering manager Violence: Not At Risk (11/19/2021)   Humiliation, Afraid, Rape, and Kick questionnaire    Fear of Current or Ex-Partner: No     Emotionally Abused: No    Physically Abused: No    Sexually Abused: No    Review of Systems  All other systems reviewed and are negative.   PHYSICAL EXAMINATION:    BP 126/74   Pulse 73   Wt 162 lb (73.5 kg)   LMP  (LMP Unknown)   SpO2 100%   BMI 27.81 kg/m     General appearance: alert, cooperative and appears stated age  Pelvic: External genitalia:  Mild whitening, agglutination of the clitoris and labia minora to the labia majora. Small area of skin breakdown over the clitoris. Also with area of irritation, erythema, skin breakdown on the left introitus              Urethra:  normal appearing urethra with no masses, tenderness or lesions                Chaperone was present for exam.  1. Vulvar irritation Biopsy in 12/23 with suspected lichen sclerosis, now with focal irritation. -She has clobetasol ointment at home, will apply BID and f/u in 2 weeks.

## 2022-04-29 ENCOUNTER — Ambulatory Visit: Payer: PPO | Admitting: Family Medicine

## 2022-05-08 ENCOUNTER — Ambulatory Visit (INDEPENDENT_AMBULATORY_CARE_PROVIDER_SITE_OTHER): Payer: PPO | Admitting: Obstetrics and Gynecology

## 2022-05-08 ENCOUNTER — Encounter: Payer: Self-pay | Admitting: Obstetrics and Gynecology

## 2022-05-08 VITALS — BP 110/62 | HR 84 | Ht 62.5 in | Wt 158.0 lb

## 2022-05-08 DIAGNOSIS — N909 Noninflammatory disorder of vulva and perineum, unspecified: Secondary | ICD-10-CM

## 2022-05-08 NOTE — Progress Notes (Signed)
GYNECOLOGY  VISIT   HPI: 77 y.o.   Married White or Caucasian Not Hispanic or Latino  female   G1P1001 with No LMP recorded (lmp unknown). Patient is postmenopausal.   here for  two week follow up on vulvar irritation. She treated with steroid ointment. She says that she is doing much better.   Biopsy in 12/23 with suspected lichen sclerosis.   GYNECOLOGIC HISTORY: No LMP recorded (lmp unknown). Patient is postmenopausal. 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 02/24/2019   Hyperparathyroidism, primary (HCC) 07/17/2018   History of lacunar cerebrovascular accident (CVA) 06/18/2018   Osteoporosis 03/13/2018   Patellofemoral arthritis 03/05/2018   DDD (degenerative disc disease), lumbar 03/05/2018   Unstable gait 01/20/2018   Aortic atherosclerosis (HCC) 06/12/2017   Palpitations 05/12/2017   Vitamin B12 deficiency 05/12/2017   Cervical radiculopathy 04/03/2016   Caregiver burden 04/03/2016   Essential tremor 04/18/2015   Benign neoplasm of colon 06/14/2014   Heavy alcohol use 04/14/2014   H/O cold sores 10/15/2013   CKD (chronic kidney disease), stage III (HCC) 10/15/2013   Former smoker 12/24/2006   Hyperlipidemia 05/26/2006   GAD (generalized anxiety disorder) 05/26/2006   Essential hypertension 05/26/2006   GERD 05/26/2006    Past Medical History:  Diagnosis Date   Anxiety    Aortic atherosclerosis (HCC)    Aortic valvar stenosis 11/2018   Mild, noted on ECHO   Arthritis    cervical neck    CKD (chronic kidney disease), stage III (HCC)    Stable   GERD (gastroesophageal reflux disease)    History of TIA (transient ischemic attack)    per patient, didn't know about it   Hyperlipidemia    Hyperparathyroidism (HCC)    Hypertension    History of    Junctional rhythm    Osteoporosis    Palpitations    Thyroid nodule    2.0 cm solid nodule in the inferior left thyroid lobe     Past Surgical History:  Procedure Laterality Date   CERVICAL LAMINECTOMY  2004   Delma Officer   COLONOSCOPY  05/31/2014   Arlyce Dice   COLONOSCOPY WITH PROPOFOL N/A 02/14/2022   Procedure: COLONOSCOPY WITH PROPOFOL;  Surgeon: Jenel Lucks, MD;  Location: [facility] ENDOSCOPY;  Service: Gastroenterology;  Laterality: N/A;   ESOPHAGOGASTRODUODENOSCOPY     PARATHYROIDECTOMY Left 01/13/2019   Procedure: LEFT PARATHYROIDECTOMY;  Surgeon: Darnell Level, MD;  Location: [facility] ORS;  Service: General;  Laterality: Left;   POLYPECTOMY  02/14/2022   Procedure: POLYPECTOMY;  Surgeon: Jenel Lucks, MD;  Location: [facility] ENDOSCOPY;  Service: Gastroenterology;;   THYROID LOBECTOMY Left 01/13/2019   Procedure: LEFT THYROID LOBECTOMY;  Surgeon: Darnell Level, MD;  Location: [facility] ORS;  Service: General;  Laterality: Left;  Current Outpatient Medications  Medication Sig Dispense Refill   acetaminophen (TYLENOL) 500 MG tablet Take 1,000 mg by mouth 2 (two) times daily.     ascorbic acid (VITAMIN C) 500 MG tablet Take 500 mg by mouth in the morning.     aspirin EC 81 MG tablet Take 81 mg by mouth at bedtime.      buPROPion (WELLBUTRIN XL) 150 MG 24 hr tablet TAKE ONE TABLET BY MOUTH DAILY 30 tablet 5   busPIRone (BUSPAR) 7.5 MG tablet Take 1 tablet (7.5 mg total) by mouth 2 (two) times daily as needed. (Patient taking differently: Take 7.5 mg by mouth daily. Vanspar) 180 tablet 3   Cholecalciferol (VITAMIN D3) 50 MCG (2000 UT) TABS Take 2,000 Units by mouth in the morning.     citalopram (CELEXA) 20 MG tablet TAKE ONE TABLET BY  MOUTH ONCE DAILY 90 tablet 0   clobetasol ointment (TEMOVATE) 0.05 % Apply a pea sized amount BID for 2 weeks, then apply 2 x a week for the next month 60 g 0   ezetimibe (ZETIA) 10 MG tablet Take 1 tablet (10 mg total) by mouth daily. 90 tablet 3   meloxicam (MOBIC) 7.5 MG tablet Take 1 tablet (7.5 mg total) by mouth daily. (Patient taking differently: Take 7.5 mg by mouth daily as needed for pain.) 90 tablet 0   pantoprazole (PROTONIX) 20 MG tablet TAKE ONE TABLET BY MOUTH DAILY 90 tablet 0   rosuvastatin (CRESTOR) 40 MG tablet TAKE ONE TABLET BY MOUTH DAILY 90 tablet 0   Sodium Sulfate-Mag Sulfate-KCl (SUTAB) [phone] MG TABS Take as directed on colonoscopy instructions 24 tablet 0   TURMERIC PO Take 1,500 mg by mouth daily.     vitamin B-12 (CYANOCOBALAMIN) 1000 MCG tablet Take 1,000 mcg by mouth in the morning.     No current facility-administered medications for this visit.     ALLERGIES: Codeine and Sulfamethoxazole  Family History  Problem Relation Age of Onset   Melanoma Mother 71       labia   Heart attack Father 47       smoker, rheumatic fever as child   Colon cancer Neg Hx    Colon polyps Neg Hx    Esophageal cancer Neg Hx    Rectal cancer Neg Hx    Stomach cancer Neg Hx     Social History   Socioeconomic History   Marital status: Married    Spouse name: Brad   Number of children: 1   Years of education: 14   Highest education level: Associate degree: academic program  Occupational History   Occupation: Retired  Tobacco Use   Smoking status: Former    Packs/day: 0.75    Years: 50.00    Additional pack years: 0.00    Total pack years: 37.50    Types: Cigarettes    Quit date: 03/07/2013    Years since quitting: 9.1   Smokeless tobacco: Never  Vaping Use   Vaping Use: Never used  Substance and Sexual Activity   Alcohol use: Yes    Comment: 1-2 daily spritzers per pt   Drug use: No   Sexual activity: Not on file  Other Topics Concern   Not on file   Social History Narrative   Married to [employer] (patient) in 59. 2nd marriage-1 child with 1 adopted grandchild.       Retired as Psychologist, forensic.       Hobbies: antiques, former Armed forces operational officer  Exercise: none currently.       Patient is right-handed. She lives with her husband in a two level home. She does not exercise.      Social Determinants of Health   Financial Resource Strain: Low Risk  (11/19/2021)   Overall Financial Resource Strain (CARDIA)    Difficulty of Paying Living Expenses: Not hard at all  Food Insecurity: No Food Insecurity (11/19/2021)   Hunger Vital Sign    Worried About Running Out of Food in the Last Year: Never true    Ran Out of Food in the Last Year: Never true  Transportation Needs: No Transportation Needs (11/19/2021)   PRAPARE - Administrator, Civil Service (Medical): No    Lack of Transportation (Non-Medical): No  Physical Activity: Inactive (11/19/2021)   Exercise Vital Sign    Days of Exercise per Week: 0 days    Minutes of Exercise per Session: 0 min  Stress: No Stress Concern Present (11/19/2021)   [employer] of Occupational Health - Occupational Stress Questionnaire    Feeling of Stress : Not at all  Social Connections: Moderately Integrated (11/19/2021)   Social Connection and Isolation Panel [NHANES]    Frequency of Communication with Friends and Family: More than three times a week    Frequency of Social Gatherings with Friends and Family: More than three times a week    Attends Religious Services: Never    Database administrator or Organizations: Yes    Attends Banker Meetings: 1 to 4 times per year    Marital Status: Married  Catering manager Violence: Not At Risk (11/19/2021)   Humiliation, Afraid, Rape, and Kick questionnaire    Fear of Current or Ex-Partner: No    Emotionally Abused: No    Physically Abused: No    Sexually Abused: No    Review of Systems  All other systems  reviewed and are negative.   PHYSICAL EXAMINATION:    BP 110/62   Pulse 84   Ht 5' 2.5" (1.588 m)   Wt 158 lb (71.7 kg)   LMP  (LMP Unknown)   SpO2 100%   BMI 28.44 kg/m     General appearance: alert, cooperative and appears stated age  Pelvic: External genitalia:  Mild whitening, agglutination of the clitoris and labia minora to the labia majora. Prior area of skin breakdown has resolved. No lesions              Urethra:  normal appearing urethra with no masses, tenderness or lesions              Bartholins and Skenes: normal                   Chaperone was present for exam.  1. Vulvar disorder Suspected lichen sclerosis, focal irritation has resolved with a course of steroid ointment -She will use the steroid ointment 1-2 x a week baseline. Aware of vulvar skin care.

## 2022-05-27 ENCOUNTER — Ambulatory Visit (INDEPENDENT_AMBULATORY_CARE_PROVIDER_SITE_OTHER): Payer: PPO | Admitting: Family Medicine

## 2022-05-27 ENCOUNTER — Encounter: Payer: Self-pay | Admitting: Family Medicine

## 2022-05-27 VITALS — BP 114/72 | HR 66 | Temp 97.3°F | Ht 62.5 in | Wt 159.8 lb

## 2022-05-27 DIAGNOSIS — R1310 Dysphagia, unspecified: Secondary | ICD-10-CM | POA: Diagnosis not present

## 2022-05-27 DIAGNOSIS — E213 Hyperparathyroidism, unspecified: Secondary | ICD-10-CM | POA: Diagnosis not present

## 2022-05-27 DIAGNOSIS — I700 Atherosclerosis of aorta: Secondary | ICD-10-CM | POA: Diagnosis not present

## 2022-05-27 DIAGNOSIS — I10 Essential (primary) hypertension: Secondary | ICD-10-CM | POA: Diagnosis not present

## 2022-05-27 DIAGNOSIS — N183 Chronic kidney disease, stage 3 unspecified: Secondary | ICD-10-CM

## 2022-05-27 MED ORDER — BUSPIRONE HCL 10 MG PO TABS: 10.0000 mg | ORAL_TABLET | Freq: Every day | ORAL | 5 refills | Status: DC

## 2022-05-27 NOTE — Progress Notes (Signed)
Phone [phone] In person visit   Subjective:   Crystal Carroll is a 77 y.o. year old very pleasant female patient who presents for/with See problem oriented charting Chief Complaint  Patient presents with   Medical Management of Chronic Issues   Hyperlipidemia   Hypertension   Stress    Pt states she is stressed out (has appt with cardiology next month).   Fall    Pt fell last week declines injury.   Anxiety    Pt states she wants to go back on a small amount of Xanax.   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 02/24/2019    Priority: High   Hyperparathyroidism, primary (HCC) 07/17/2018    Priority: High   History of lacunar cerebrovascular accident (CVA) 06/18/2018    Priority: High   Caregiver burden 04/03/2016    Priority: High   Mild aortic stenosis 07/19/2019    Priority: Medium    Osteoporosis 03/13/2018    Priority: Medium    DDD (degenerative disc disease), lumbar 03/05/2018    Priority: Medium    Aortic atherosclerosis (HCC) 06/12/2017    Priority: Medium    Palpitations 05/12/2017    Priority: Medium    Vitamin B12 deficiency 05/12/2017    Priority: Medium    Cervical radiculopathy 04/03/2016    Priority: Medium    Essential tremor 04/18/2015    Priority: Medium    CKD (chronic kidney disease), stage III (HCC) 10/15/2013    Priority: Medium    Former smoker 12/24/2006    Priority: Medium    Hyperlipidemia 05/26/2006    Priority: Medium    GAD (generalized anxiety disorder) 05/26/2006    Priority: Medium    Essential hypertension 05/26/2006    Priority: Medium    Patellofemoral arthritis 03/05/2018    Priority: Low   Unstable gait 01/20/2018    Priority: Low   Benign neoplasm of colon  06/14/2014    Priority: Low   H/O cold sores 10/15/2013    Priority: Low   GERD 05/26/2006    Priority: Low    Medications- reviewed and updated Current Outpatient Medications  Medication Sig Dispense Refill   acetaminophen (TYLENOL) 500 MG tablet Take 1,000 mg by mouth 2 (two) times daily.     ascorbic acid (VITAMIN C) 500 MG tablet Take 500 mg by mouth in the morning.     aspirin EC 81 MG tablet Take 81 mg by mouth at bedtime.      buPROPion (WELLBUTRIN XL) 150 MG 24 hr tablet TAKE ONE TABLET BY MOUTH DAILY 30 tablet 5   Cholecalciferol (VITAMIN D3) 50 MCG (2000 UT) TABS Take 2,000 Units by mouth in the morning.     citalopram (CELEXA) 20 MG tablet TAKE ONE TABLET BY MOUTH ONCE DAILY 90 tablet 0   clobetasol ointment (TEMOVATE) 0.05 % Apply a pea sized amount BID for 2 weeks, then apply 2 x a week for the next month 60 g 0   ezetimibe (ZETIA) 10 MG tablet Take 1 tablet (10 mg total) by mouth daily. 90 tablet 3   meloxicam (MOBIC) 7.5 MG tablet Take 1 tablet (7.5 mg total) by mouth daily. (Patient taking differently: Take 7.5 mg by mouth daily as needed for pain.) 90  tablet 0   pantoprazole (PROTONIX) 20 MG tablet TAKE ONE TABLET BY MOUTH DAILY 90 tablet 0   rosuvastatin (CRESTOR) 40 MG tablet TAKE ONE TABLET BY MOUTH DAILY 90 tablet 0   Sodium Sulfate-Mag Sulfate-KCl (SUTAB) [phone] MG TABS Take as directed on colonoscopy instructions 24 tablet 0   TURMERIC PO Take 1,500 mg by mouth daily.     vitamin B-12 (CYANOCOBALAMIN) 1000 MCG tablet Take 1,000 mcg by mouth in the morning.     busPIRone (BUSPAR) 10 MG tablet Take 1 tablet (10 mg total) by mouth daily. 30 tablet 5   No current facility-administered medications for this visit.     Objective:  BP 114/72   Pulse 66   Temp (!) 97.3 F (36.3 C)   Ht 5' 2.5" (1.588 m)   Wt 159 lb 12.8 oz (72.5 kg)   LMP  (LMP Unknown)   SpO2 95%   BMI 28.76 kg/m  Gen: NAD, resting comfortably CV: RRR no murmurs rubs or gallops Lungs:  CTAB no crackles, wheeze, rhonchi Ext: no edema Skin: warm, dry Neuro: Walks with cane-able to stand without assistance or without cane and appears stable    Assessment and Plan   #Anxiety/stress management S: Compliant with Celexa 20 mg daily , Wellbutrin 150mg  XR (reduced from 300mg  due to anxiety concern). Buspirone 7.5 mg BID prescribed but only taking once a day -Prior on xanax but had started to feel oversedated.  - a lot of stress with caregiving for husband. They are looking for next level of care for him- she is unable to care for him with his needs and worsening needs.  -has had palpitations- sees cardiology next week -son had heart attack at 54 and required stent- doing ok but very scary -has seen therapist in past but never found helpful A/P: Patient with ongoing stress/anxiety particularly with caregiver burden. -At first she asked about taking alprazolam again but she had been oversedated on this in the past and we discussed that this could increase her fall risk but I would greatly prefer to avoid this if possible -Continue citalopram and Wellbutrin but -Recommended busprione 7.5 mg twice a day particularly with half life but she strongly preferred to try 10 mg once daily which was sent in for her -Declines therapy at this time  #Dysphagia S:still getting sensation of food stuck in back of her throat- not with liquids still. No particular foods. Doesn't happen with saliva.  -Still taking pantoprazole 20 mg for acid reflux-no recent issues -did not mention to Dr. Tomasa Rand at time of last visit but would be potentially open to endoscopy A/P: with ongoing issues- placed referral to gastroenterology today formally for their opinion- may need EGD- wonder about need esophageal dilation   #Hyperlipidemia/aortic atherosclerosis/ history of stroke- on aspirin 81 mg with LDL goal under 70 S: Compliant with rosuvastatin 40 mg and Zetia 10 mg.   -Aortic atherosclerosis noted CT  06/26/2017 for lung cancer screening.   Lab Results  Component Value Date   CHOL 146 08/23/2021   HDL 65.40 08/23/2021   LDLCALC 53 08/23/2021   LDLDIRECT 106 (H) 07/19/2016   TRIG 142.0 08/23/2021   CHOLHDL 2 08/23/2021  A/P:LDL looked great last check- at goal for both history of CVA and aortic atherosclerosis. Aortic atherosclerosis (presumed stable)- LDL goal ideally <70 - continue current medications    #Hypertension/CKD stage III S: Patient remains off lisinopril fortunately blood pressure remains controlled  -GFR has been stable largely in the 40s  lately- in the past had been in the 30s A/P: Hypertension-controlled. Continue current medications. CKD stage III-GFR stable on most recent check actually up to nearly 59 and has been in the 30s and 40s in the past  #Primary hyperparathyroidism status post parathyroidectomy January 2021-appears to have been appropriately treated-calcium normal on last check-we will recheck next visit  #Unstable gait S: Started January 2020.  Stat head CT to rule out subdural hematoma.  Initially also seem to have some lower extremity weakness and numbness.  There was concern for lumbar stenosis.  Symptoms improved with time.  Ultimately did MRI of the brain to rule out prior cerebellar stroke-none was detected.  Did have prior lacunar stroke and patient was placed back on aspirin.  Patient later saw Dr. Everlena Cooper July 28, 2018-MRI of cervical spine without contrast was ordered.  There were some disc bulges and MRI of lumbar spine was also ordered September 2020-this showed spinal stenosis which was thought to be contributing to leg pain and balance issues.  Neurology offered referral to neurosurgery orthopedics- she opted out   Reports had a fall last week without injury- asked about assistive device. Was not using cane. Rubber sole bedroom shoes clicked and she went backwards. Took tylenol and biofreeze biofreeze but never had significant pain. She got rid of  those bedroom shoes as they had tendency to catch and almost feell in past.  A/P: ongoing issues- encouraged to always use her cane even inside the home though glad she got rid of her bedroom shoes that were high risk   Recommended follow up: Return in about 3 months (around 08/27/2022) for followup or sooner if needed.Schedule b4 you leave. Future Appointments  Date Time Provider Department Center  07/02/2022  3:35 PM Jodelle Gross, NP [facility] None    Lab/Order associations:   ICD-10-CM   1. Dysphagia, unspecified type  R13.10 Ambulatory referral to Gastroenterology    2. Essential hypertension  I10     3. Aortic atherosclerosis (HCC) Chronic I70.0     4. Hyperparathyroidism (HCC) Chronic E21.3     5. Stage 3 chronic kidney disease, unspecified whether stage 3a or 3b CKD (HCC) Chronic N18.30       Meds ordered this encounter  Medications   busPIRone (BUSPAR) 10 MG tablet    Sig: Take 1 tablet (10 mg total) by mouth daily.    Dispense:  30 tablet    Refill:  5    Time Spent: 50 minutes of total time (4:20 PM-5:10 PM) was spent on the date of the encounter performing the following actions: chart review prior to seeing the patient, obtaining history, performing a medically necessary exam, counseling on the treatment plan as well as warnings about other potential plans such as adding alprazolam medication as well as counseling about caregiver burden and discussing alternate forms of therapy-she ultimately was not interested, placing orders, and documenting in our EHR.   Return precautions advised.  Tana Conch, MD

## 2022-05-27 NOTE — Patient Instructions (Addendum)
[facility] contact Please call to schedule visit and/or procedure Address: [address] [place], [place], [place] [address] Phone: [phone]   ongoing issues- encouraged to always use her cane even inside the home though glad she got rid of her bedroom shoes that were high risk   Recommended busprione 7.5 mg twice a day particularly with half life but she strongly preferred to try 10 mg once daily  Call pulmonology back for lung cancer screening  Recommended follow up: Return in about 3 months (around 08/27/2022) for followup or sooner if needed.Schedule b4 you leave.

## 2022-06-08 ENCOUNTER — Other Ambulatory Visit: Payer: Self-pay | Admitting: Family Medicine

## 2022-06-27 NOTE — Progress Notes (Signed)
Cardiology Clinic Note   Patient Name: Crystal Carroll Date of Encounter: 07/02/2022  Primary Care Provider:  Shelva Majestic, MD Primary Cardiologist:  Little Ishikawa, MD  Patient Profile    77 year old female hx of hypertension, hyperlipidemia, palpitations, hyperparathyroidism, CKD stage III. She wore a 30-day cardiac monitor, which showed predominantly sinus rhythm and most of the triggered events were sinus rhythm. However there were occasional episodes of accelerated junctional rhythm and 2 of her triggered events involved a junctional rhythm, but she had no evidence of symptomatic bradycardia, no indication for pacemaker .   TTE was checked evaluate for structural heart disease, which showed normal LV systolic function, normal RV function, mild aortic stenosis, small PFO.  Repeat echo 02/14/2020 showed mild aortic stenosis (mean gradient 12 mmHg, V-max 2.4 m/s).  Normal biventricular function.   Past Medical History    Past Medical History:  Diagnosis Date   Anxiety    Aortic atherosclerosis (HCC)    Aortic valvar stenosis 11/2018   Mild, noted on ECHO   Arthritis    cervical neck   CKD (chronic kidney disease), stage III (HCC)    Stable   GERD (gastroesophageal reflux disease)    History of TIA (transient ischemic attack)    per patient, didn't know about it   Hyperlipidemia    Hyperparathyroidism (HCC)    Hypertension    History of    Junctional rhythm    Osteoporosis    Palpitations    Thyroid nodule    2.0 cm solid nodule in the inferior left thyroid lobe    Past Surgical History:  Procedure Laterality Date   CERVICAL LAMINECTOMY  2004   Crystal Carroll   COLONOSCOPY  05/31/2014   Crystal Carroll   COLONOSCOPY WITH PROPOFOL N/A 02/14/2022   Procedure: COLONOSCOPY WITH PROPOFOL;  Surgeon: Crystal Lucks, MD;  Location: [facility] ENDOSCOPY;  Service: Gastroenterology;  Laterality: N/A;   ESOPHAGOGASTRODUODENOSCOPY     PARATHYROIDECTOMY Left 01/13/2019   Procedure:  LEFT PARATHYROIDECTOMY;  Surgeon: Crystal Level, MD;  Location: [facility] ORS;  Service: General;  Laterality: Left;   POLYPECTOMY  02/14/2022   Procedure: POLYPECTOMY;  Surgeon: Crystal Lucks, MD;  Location: [facility] ENDOSCOPY;  Service: Gastroenterology;;   THYROID LOBECTOMY Left 01/13/2019   Procedure: LEFT THYROID LOBECTOMY;  Surgeon: Crystal Level, MD;  Location: [facility] ORS;  Service: General;  Laterality: Left;    Allergies  Allergies  Allergen Reactions   Codeine Nausea Only   Sulfamethoxazole     GI upset    History of Present Illness    [name] is a 77 year old female looking younger than the stated age, who presents today for follow-up of aortic valve atherosclerosis, hypertension, and palpitations, she states that she is under good bit of stress at home as her husband has been diagnosed with Parkinson's a few years back but is now beginning to show some worsening symptoms requiring Home care daily.  Her son who lives in [place] who is 14 has had a myocardial infarction within the month.    This is caused some anxiety and fatigue and she is feeling more palpitations associated with this.  She feels that she is not doing as much self-care as she should be as she is focusing on her husband and her son at this time.  She denies any other cardiac symptoms of dyspnea on exertion, chest pain, dizziness, or near syncope.  Home Medications    Current Outpatient Medications  Medication Sig  Dispense Refill   acetaminophen (TYLENOL) 500 MG tablet Take 1,000 mg by mouth 2 (two) times daily.     ascorbic acid (VITAMIN C) 500 MG tablet Take 500 mg by mouth in the morning.     aspirin EC 81 MG tablet Take 81 mg by mouth at bedtime.      buPROPion (WELLBUTRIN XL) 150 MG 24 hr tablet TAKE ONE TABLET BY MOUTH DAILY 30 tablet 5   busPIRone (BUSPAR) 10 MG tablet Take 1 tablet (10 mg total) by mouth daily. 30 tablet 5   Cholecalciferol (VITAMIN D3) 50 MCG (2000 UT) TABS Take 2,000 Units by  mouth in the morning.     citalopram (CELEXA) 20 MG tablet TAKE ONE TABLET BY MOUTH ONCE DAILY 90 tablet 3   clobetasol ointment (TEMOVATE) 0.05 % Apply a pea sized amount BID for 2 weeks, then apply 2 x a week for the next month 60 g 0   ezetimibe (ZETIA) 10 MG tablet Take 1 tablet (10 mg total) by mouth daily. 90 tablet 3   meloxicam (MOBIC) 7.5 MG tablet Take 1 tablet (7.5 mg total) by mouth daily. (Patient taking differently: Take 7.5 mg by mouth daily as needed for pain.) 90 tablet 0   pantoprazole (PROTONIX) 20 MG tablet TAKE ONE TABLET BY MOUTH DAILY 90 tablet 3   rosuvastatin (CRESTOR) 40 MG tablet TAKE ONE TABLET BY MOUTH DAILY 90 tablet 3   Sodium Sulfate-Mag Sulfate-KCl (SUTAB) [phone] MG TABS Take as directed on colonoscopy instructions 24 tablet 0   vitamin B-12 (CYANOCOBALAMIN) 1000 MCG tablet Take 1,000 mcg by mouth in the morning.     TURMERIC PO Take 1,500 mg by mouth daily. (Patient not taking: Reported on 07/02/2022)     No current facility-administered medications for this visit.     Family History    Family History  Problem Relation Age of Onset   Melanoma Mother 87       labia   Heart attack Father 77       smoker, rheumatic fever as child   Colon cancer Neg Hx    Colon polyps Neg Hx    Esophageal cancer Neg Hx    Rectal cancer Neg Hx    Stomach cancer Neg Hx    She indicated that her mother is deceased. She indicated that her father is deceased. She indicated that the status of her neg hx is unknown.  Social History    Social History   Socioeconomic History   Marital status: Married    Spouse name: Crystal Carroll   Number of children: 1   Years of education: 14   Highest education Carroll: Associate degree: academic program  Occupational History   Occupation: Retired  Tobacco Use   Smoking status: Former    Packs/day: 0.75    Years: 50.00    Additional pack years: 0.00    Total pack years: 37.50    Types: Cigarettes    Quit date: 03/07/2013    Years  since quitting: 9.3   Smokeless tobacco: Never  Vaping Use   Vaping Use: Never used  Substance and Sexual Activity   Alcohol use: Yes    Comment: 1-2 daily spritzers per pt   Drug use: No   Sexual activity: Not on file  Other Topics Concern   Not on file  Social History Narrative   Married to [employer] (patient) in 1981. 2nd marriage-1 child with 1 adopted grandchild.       Retired as  Psychologist, forensic.       Hobbies: antiques, former Armed forces operational Carroll      Exercise: none currently.       Patient is right-handed. She lives with her husband in a two Carroll home. She does not exercise.      Social Determinants of Health   Financial Resource Strain: Low Risk  (11/19/2021)   Overall Financial Resource Strain (CARDIA)    Difficulty of Paying Living Expenses: Not hard at all  Food Insecurity: No Food Insecurity (11/19/2021)   Hunger Vital Sign    Worried About Running Out of Food in the Last Year: Never true    Ran Out of Food in the Last Year: Never true  Transportation Needs: No Transportation Needs (11/19/2021)   PRAPARE - Administrator, Civil Service (Medical): No    Lack of Transportation (Non-Medical): No  Physical Activity: Inactive (11/19/2021)   Exercise Vital Sign    Days of Exercise per Week: 0 days    Minutes of Exercise per Session: 0 min  Stress: No Stress Concern Present (11/19/2021)   [employer] of Occupational Health - Occupational Stress Questionnaire    Feeling of Stress : Not at all  Social Connections: Moderately Integrated (11/19/2021)   Social Connection and Isolation Panel [NHANES]    Frequency of Communication with Friends and Family: More than three times a week    Frequency of Social Gatherings with Friends and Family: More than three times a week    Attends Religious Services: Never    Database administrator or Organizations: Yes    Attends Banker Meetings: 1 to 4 times per year    Marital Status: Married   Catering manager Violence: Not At Risk (11/19/2021)   Humiliation, Afraid, Rape, and Kick questionnaire    Fear of Current or Ex-Partner: No    Emotionally Abused: No    Physically Abused: No    Sexually Abused: No     Review of Systems    General:  No chills, fever, night sweats or weight changes.  Cardiovascular:  No chest pain, dyspnea on exertion, edema, orthopnea, positive for palpitations, paroxysmal nocturnal dyspnea. Dermatological: No rash, lesions/masses Respiratory: No cough, dyspnea Urologic: No hematuria, dysuria Abdominal:   No nausea, vomiting, diarrhea, bright red blood per rectum, melena, or hematemesis Neurologic:  No visual changes, wkns, changes in mental status. All other systems reviewed and are otherwise negative except as noted above.     Physical Exam    VS:  BP (!) 122/58 (BP Location: Left Arm, Patient Position: Sitting, Cuff Size: Normal)   Pulse 66   Ht 5\' 4"  (1.626 m)   Wt 161 lb (73 kg)   LMP  (LMP Unknown)   SpO2 95%   BMI 27.64 kg/m  , BMI Body mass index is 27.64 kg/m.     GEN: Well nourished, well developed, in no acute distress. HEENT: normal. Neck: Supple, no JVD, carotid bruits, or masses. Cardiac: RRR, 2/6 systolic murmurs heard best at the right sternal border with radiation into the right carotid, no rubs, or gallops. No clubbing, cyanosis, edema.  Radials/DP/PT 2+ and equal bilaterally.  Respiratory:  Respirations regular and unlabored, clear to auscultation bilaterally. GI: Soft, nontender, nondistended, BS + x 4. MS: no deformity or atrophy.  Laceration to the lower left pretibial area with Band-Aid covering from her pet scratching her.  No signs of infection Skin: warm and dry, no rash. Neuro:  Strength and sensation are intact.  Psych: Normal affect.  Accessory Clinical Findings   EKG: (Personally reviewed) normal sinus rhythm with nonspecific ST T wave abnormality heart rate of 66 bpm.  Lab Results  Component Value Date    WBC 8.1 12/25/2021   HGB 13.4 12/25/2021   HCT 38.2 12/25/2021   MCV 88.2 12/25/2021   PLT 231.0 12/25/2021   Lab Results  Component Value Date   CREATININE 0.93 12/25/2021   BUN 15 12/25/2021   NA 141 12/25/2021   K 3.7 12/25/2021   CL 105 12/25/2021   CO2 27 12/25/2021   Lab Results  Component Value Date   ALT 8 12/25/2021   AST 12 12/25/2021   ALKPHOS 56 12/25/2021   BILITOT 0.4 12/25/2021   Lab Results  Component Value Date   CHOL 146 08/23/2021   HDL 65.40 08/23/2021   LDLCALC 53 08/23/2021   LDLDIRECT 106 (H) 07/19/2016   TRIG 142.0 08/23/2021   CHOLHDL 2 08/23/2021    No results found for: "HGBA1C"  Review of Prior Studies: Echocardiogram 02/14/2020 1. The aortic valve is tricuspid. There is mild calcification of the  aortic valve. There is mild thickening of the aortic valve. Aortic valve  regurgitation is not visualized. Aortic valve area, by VTI measures 1.98  cm. Aortic valve mean gradient  measures 12.0 mmHg. Aortic valve Vmax measures 2.41 m/s.   2. Left ventricular ejection fraction, by estimation, is 60 to 65%. Left  ventricular ejection fraction by 3D volume is 62 %. The left ventricle has  normal function. The left ventricle has no regional wall motion  abnormalities. Left ventricular diastolic   parameters were normal. The average left ventricular global longitudinal  strain is -20.6 %. The global longitudinal strain is normal.   3. Right ventricular systolic function is normal. The right ventricular  size is normal. Tricuspid regurgitation signal is inadequate for assessing  PA pressure.   4. The mitral valve is grossly normal. No evidence of mitral valve  regurgitation. No evidence of mitral stenosis.   5. The inferior vena cava is normal in size with greater than 50%  respiratory variability, suggesting right atrial pressure of 3 mmHg.   Comparison(s): A prior study was performed on 11/24/2018. No significant  change from prior study. No  change from prior study. AS remains mild.       Assessment & Plan   1.  Aortic valve atherosclerosis: Will repeat echocardiogram for comparison to previous study in 2022.  She is asymptomatic with the exception of occasional PACs which she is related to stress.  Heart rate is well-controlled currently.  No ischemic testing is planned.  2.  Hypercholesterolemia: Continue atorvastatin and Zetia as directed.  Labs are followed by primary care provider.  Goal of LDL less than 100.  3.  Situational stress and anxiety: Self-care strategies to include massage, socialization, and low-Carroll exercise are recommended.  Limit use of alcohol.  She should follow-up with PCP if symptoms begin to get worse, or even consider psychological counseling if coping strategies are not helping her.    Signed, Bettey Mare. Liborio Nixon, ANP, AACC   07/02/2022 5:11 PM      Office [phone] Fax [phone]  Notice: This dictation was prepared with Dragon dictation along with smaller phrase technology. Any transcriptional errors that result from this process are unintentional and may not be corrected upon review.

## 2022-07-02 ENCOUNTER — Other Ambulatory Visit: Payer: Self-pay

## 2022-07-02 ENCOUNTER — Encounter: Payer: Self-pay | Admitting: Adult Health

## 2022-07-02 ENCOUNTER — Ambulatory Visit: Payer: PPO | Attending: Adult Health | Admitting: Adult Health

## 2022-07-02 VITALS — BP 122/58 | HR 66 | Ht 64.0 in | Wt 161.0 lb

## 2022-07-02 DIAGNOSIS — R002 Palpitations: Secondary | ICD-10-CM | POA: Diagnosis not present

## 2022-07-02 DIAGNOSIS — Z636 Dependent relative needing care at home: Secondary | ICD-10-CM | POA: Diagnosis not present

## 2022-07-02 DIAGNOSIS — E7800 Pure hypercholesterolemia, unspecified: Secondary | ICD-10-CM

## 2022-07-02 DIAGNOSIS — I358 Other nonrheumatic aortic valve disorders: Secondary | ICD-10-CM | POA: Diagnosis not present

## 2022-07-02 DIAGNOSIS — I350 Nonrheumatic aortic (valve) stenosis: Secondary | ICD-10-CM

## 2022-07-02 NOTE — Patient Instructions (Addendum)
Medication Instructions:  No Changes *If you need a refill on your cardiac medications before your next appointment, please call your pharmacy*   Lab Work: No Labs If you have labs (blood work) drawn today and your tests are completely normal, you will receive your results only by: MyChart Message (if you have MyChart) OR A paper copy in the mail If you have any lab test that is abnormal or we need to change your treatment, we will call you to review the results.   Testing/Procedures: Your physician has requested that you have an echocardiogram. Echocardiography is a painless test that uses sound waves to create images of your heart. It provides your doctor with information about the size and shape of your heart and how well your heart's chambers and valves are working. This procedure takes approximately one hour. There are no restrictions for this procedure. Please do NOT wear cologne, perfume, aftershave, or lotions (deodorant is allowed). Please arrive 15 minutes prior to your appointment time.    Follow-Up: At [facility], you and your health needs are our priority.  As part of our continuing mission to provide you with exceptional heart care, we have created designated Provider Care Teams.  These Care Teams include your primary Cardiologist (physician) and Advanced Practice Providers (APPs -  Physician Assistants and Nurse Practitioners) who all work together to provide you with the care you need, when you need it.  We recommend signing up for the patient portal called "MyChart".  Sign up information is provided on this After Visit Summary.  MyChart is used to connect with patients for Virtual Visits (Telemedicine).  Patients are able to view lab/test results, encounter notes, upcoming appointments, etc.  Non-urgent messages can be sent to your provider as well.   To learn more about what you can do with MyChart, go to [email or web address].    Your next appointment:   1  year(s)  Provider:   Little Ishikawa, MD

## 2022-07-07 DIAGNOSIS — S63501A Unspecified sprain of right wrist, initial encounter: Secondary | ICD-10-CM | POA: Diagnosis not present

## 2022-07-07 DIAGNOSIS — M25531 Pain in right wrist: Secondary | ICD-10-CM | POA: Diagnosis not present

## 2022-07-16 ENCOUNTER — Ambulatory Visit (INDEPENDENT_AMBULATORY_CARE_PROVIDER_SITE_OTHER): Payer: PPO

## 2022-07-16 ENCOUNTER — Ambulatory Visit: Payer: PPO | Admitting: Sports Medicine

## 2022-07-16 VITALS — BP 122/60 | HR 54 | Ht 64.0 in | Wt 159.0 lb

## 2022-07-16 DIAGNOSIS — M8580 Other specified disorders of bone density and structure, unspecified site: Secondary | ICD-10-CM | POA: Diagnosis not present

## 2022-07-16 DIAGNOSIS — M25531 Pain in right wrist: Secondary | ICD-10-CM

## 2022-07-16 MED ORDER — GABAPENTIN 100 MG PO CAPS: 100.0000 mg | ORAL_CAPSULE | Freq: Every evening | ORAL | 0 refills | Status: DC

## 2022-07-16 NOTE — Patient Instructions (Signed)
Refill gabapentin 100 mg nightly as needed  Tylenol [phone] mg 2-3 times a day for pain relief  Recommend wearing wrist brace at all times No lifting anything heavier than a pen with right arm  2 week follow up

## 2022-07-16 NOTE — Progress Notes (Signed)
Aleen Sells D.[name] [address] [place] [address] Phone: [phone]   Assessment and Plan:     1. Right wrist pain -Acute, initial sports medicine visit - Right wrist pain after FOOSH 2 weeks ago.  Patient continues to have mild ecchymosis, swelling, TTP to distal radius.  There is slight cortical irregularity along distal radius on x-ray.  This may indicate a crush injury.  Multiple areas of degenerative change and carpal bones, though no definitive fracture seen.  I will await radiology review and read. - Recommend wearing wrist brace at all times.  Nonweightbearing in right upper extremity for anything heavier than a pen - Continue Tylenol for day-to-day pain relief - Swelling is causing neurologic symptoms radiating into patient's hand.  Will refill gabapentin 100 mg nightly to use as needed for neurologic pain   Pertinent previous records reviewed include none   Follow Up: 2 weeks for reevaluation.  If still TTP, could repeat x-rays.  If improving, could wean off of wrist brace and discussed home exercises versus physical therapy   Subjective:   I, [name], am serving as a Neurosurgeon for Doctor Richardean Sale  Chief Complaint: right wrist pain   HPI:   07/16/22 Patient is a 77 year old female complaining of right wrist pain. Patient states that she fell 2 weeks ago she fell in the bathroom on her carpet. She fell backwards.  she went to the ED and they took xrays and they told her she had a bad sprain. Has bruising , decreased Rom due to pain . Decreased grip strength, is able to move her fingers. Tylenol for the pain and that helps.   Relevant Historical Information:  Hypertension, osteoporosis, CKD stage III, history of CVA  Additional pertinent review of systems negative.   Current Outpatient Medications:    gabapentin (NEURONTIN) 100 MG capsule, Take 1 capsule (100 mg total) by mouth at bedtime., Disp: 30 capsule,  Rfl: 0   acetaminophen (TYLENOL) 500 MG tablet, Take 1,000 mg by mouth 2 (two) times daily., Disp: , Rfl:    ascorbic acid (VITAMIN C) 500 MG tablet, Take 500 mg by mouth in the morning., Disp: , Rfl:    aspirin EC 81 MG tablet, Take 81 mg by mouth at bedtime. , Disp: , Rfl:    buPROPion (WELLBUTRIN XL) 150 MG 24 hr tablet, TAKE ONE TABLET BY MOUTH DAILY, Disp: 30 tablet, Rfl: 5   busPIRone (BUSPAR) 10 MG tablet, Take 1 tablet (10 mg total) by mouth daily., Disp: 30 tablet, Rfl: 5   Cholecalciferol (VITAMIN D3) 50 MCG (2000 UT) TABS, Take 2,000 Units by mouth in the morning., Disp: , Rfl:    citalopram (CELEXA) 20 MG tablet, TAKE ONE TABLET BY MOUTH ONCE DAILY, Disp: 90 tablet, Rfl: 3   clobetasol ointment (TEMOVATE) 0.05 %, Apply a pea sized amount BID for 2 weeks, then apply 2 x a week for the next month, Disp: 60 g, Rfl: 0   ezetimibe (ZETIA) 10 MG tablet, Take 1 tablet (10 mg total) by mouth daily., Disp: 90 tablet, Rfl: 3   meloxicam (MOBIC) 7.5 MG tablet, Take 1 tablet (7.5 mg total) by mouth daily. (Patient taking differently: Take 7.5 mg by mouth daily as needed for pain.), Disp: 90 tablet, Rfl: 0   pantoprazole (PROTONIX) 20 MG tablet, TAKE ONE TABLET BY MOUTH DAILY, Disp: 90 tablet, Rfl: 3   rosuvastatin (CRESTOR) 40 MG tablet, TAKE ONE TABLET BY MOUTH  DAILY, Disp: 90 tablet, Rfl: 3   Sodium Sulfate-Mag Sulfate-KCl (SUTAB) [phone] MG TABS, Take as directed on colonoscopy instructions, Disp: 24 tablet, Rfl: 0   TURMERIC PO, Take 1,500 mg by mouth daily. (Patient not taking: Reported on 07/02/2022), Disp: , Rfl:    vitamin B-12 (CYANOCOBALAMIN) 1000 MCG tablet, Take 1,000 mcg by mouth in the morning., Disp: , Rfl:    Objective:     Vitals:   07/16/22 1332  BP: 122/60  Pulse: (!) 54  SpO2: 95%  Weight: 159 lb (72.1 kg)  Height: 5\' 4"  (1.626 m)      Body mass index is 27.29 kg/m.    Physical Exam:    General: Appears well, nad, nontoxic and pleasant Neuro:sensation intact,  strength is 5/5 with df/pf/inv/ev, muscle tone wnl Skin:no susupicious lesions or rashes  Right wrist:   No deformity Swelling around wrist, maximal at distal radius.  TTP with mild ecchymosis at area of maximal swelling ROM  Ext 50, flexion40, radial/ulnar deviation 20 TTP snuffbox, dorsal carpals, volar carpals, radial styloid nttp over the  ulnar styloid, 1st mcp, tfcc      Electronically signed by:  Aleen Sells [name] 3:10 PM 07/16/22

## 2022-07-25 DIAGNOSIS — Z1231 Encounter for screening mammogram for malignant neoplasm of breast: Secondary | ICD-10-CM | POA: Diagnosis not present

## 2022-07-25 LAB — HM MAMMOGRAPHY

## 2022-07-30 ENCOUNTER — Ambulatory Visit (HOSPITAL_COMMUNITY): Payer: PPO | Attending: Cardiology

## 2022-07-30 DIAGNOSIS — I350 Nonrheumatic aortic (valve) stenosis: Secondary | ICD-10-CM | POA: Diagnosis not present

## 2022-07-30 NOTE — Progress Notes (Unsigned)
    Crystal Carroll D.[name] [address] [place] [address] Phone: [phone]   Assessment and Plan:     There are no diagnoses linked to this encounter.  ***   Pertinent previous records reviewed include ***   Follow Up: ***     Subjective:   I, Crystal Carroll, am serving as a Neurosurgeon for Doctor Richardean Sale   Chief Complaint: right wrist pain    HPI:    07/16/22 Patient is a 77 year old female complaining of right wrist pain. Patient states that she fell 2 weeks ago she fell in the bathroom on her carpet. She fell backwards.  she went to the ED and they took xrays and they told her she had a bad sprain. Has bruising , decreased Rom due to pain . Decreased grip strength, is able to move her fingers. Tylenol for the pain and that helps.   07/31/2022 Patient states    Relevant Historical Information:  Hypertension, osteoporosis, CKD stage III, history of CVA  Additional pertinent review of systems negative.   Current Outpatient Medications:    acetaminophen (TYLENOL) 500 MG tablet, Take 1,000 mg by mouth 2 (two) times daily., Disp: , Rfl:    ascorbic acid (VITAMIN C) 500 MG tablet, Take 500 mg by mouth in the morning., Disp: , Rfl:    aspirin EC 81 MG tablet, Take 81 mg by mouth at bedtime. , Disp: , Rfl:    buPROPion (WELLBUTRIN XL) 150 MG 24 hr tablet, TAKE ONE TABLET BY MOUTH DAILY, Disp: 30 tablet, Rfl: 5   busPIRone (BUSPAR) 10 MG tablet, Take 1 tablet (10 mg total) by mouth daily., Disp: 30 tablet, Rfl: 5   Cholecalciferol (VITAMIN D3) 50 MCG (2000 UT) TABS, Take 2,000 Units by mouth in the morning., Disp: , Rfl:    citalopram (CELEXA) 20 MG tablet, TAKE ONE TABLET BY MOUTH ONCE DAILY, Disp: 90 tablet, Rfl: 3   clobetasol ointment (TEMOVATE) 0.05 %, Apply a pea sized amount BID for 2 weeks, then apply 2 x a week for the next month, Disp: 60 g, Rfl: 0   ezetimibe (ZETIA) 10 MG tablet, Take 1 tablet (10 mg total) by mouth  daily., Disp: 90 tablet, Rfl: 3   gabapentin (NEURONTIN) 100 MG capsule, Take 1 capsule (100 mg total) by mouth at bedtime., Disp: 30 capsule, Rfl: 0   meloxicam (MOBIC) 7.5 MG tablet, Take 1 tablet (7.5 mg total) by mouth daily. (Patient taking differently: Take 7.5 mg by mouth daily as needed for pain.), Disp: 90 tablet, Rfl: 0   pantoprazole (PROTONIX) 20 MG tablet, TAKE ONE TABLET BY MOUTH DAILY, Disp: 90 tablet, Rfl: 3   rosuvastatin (CRESTOR) 40 MG tablet, TAKE ONE TABLET BY MOUTH DAILY, Disp: 90 tablet, Rfl: 3   Sodium Sulfate-Mag Sulfate-KCl (SUTAB) [phone] MG TABS, Take as directed on colonoscopy instructions, Disp: 24 tablet, Rfl: 0   TURMERIC PO, Take 1,500 mg by mouth daily. (Patient not taking: Reported on 07/02/2022), Disp: , Rfl:    vitamin B-12 (CYANOCOBALAMIN) 1000 MCG tablet, Take 1,000 mcg by mouth in the morning., Disp: , Rfl:    Objective:     There were no vitals filed for this visit.    There is no height or weight on file to calculate BMI.    Physical Exam:    ***   Electronically signed by:  Crystal Carroll [name] 7:23 AM 07/30/22

## 2022-07-31 ENCOUNTER — Ambulatory Visit (INDEPENDENT_AMBULATORY_CARE_PROVIDER_SITE_OTHER): Payer: PPO

## 2022-07-31 ENCOUNTER — Ambulatory Visit: Payer: PPO | Admitting: Sports Medicine

## 2022-07-31 VITALS — BP 138/80 | HR 61 | Ht 64.0 in | Wt 159.0 lb

## 2022-07-31 DIAGNOSIS — M25531 Pain in right wrist: Secondary | ICD-10-CM | POA: Diagnosis not present

## 2022-07-31 NOTE — Patient Instructions (Signed)
Brace 4 more weeks Wrist HEP come out of brace 1-2 times a day  Tylenol day to day pain relief   4 week follow up

## 2022-08-23 NOTE — Progress Notes (Unsigned)
Crystal Carroll D.[facility] [address] [place] [address] Phone: [phone]   Assessment and Plan:     There are no diagnoses linked to this encounter.  ***   Pertinent previous records reviewed include ***   Follow Up: ***     Subjective:   I, Crystal Carroll, am serving as a Neurosurgeon for Doctor Richardean Sale   Chief Complaint: right wrist pain    HPI:    07/16/22 Patient is a 77 year old female complaining of right wrist pain. Patient states that she fell 2 weeks ago she fell in the bathroom on her carpet. She fell backwards.  she went to the ED and they took xrays and they told her she had a bad sprain. Has bruising , decreased Rom due to pain . Decreased grip strength, is able to move her fingers. Tylenol for the pain and that helps.    07/31/2022 Patient states that she is better but the wrist looks funny and still has some pain and has some point tenderness    08/28/2022 Patient states   Relevant Historical Information:  Hypertension, osteoporosis, CKD stage III, history of CVA  Additional pertinent review of systems negative.   Current Outpatient Medications:    acetaminophen (TYLENOL) 500 MG tablet, Take 1,000 mg by mouth 2 (two) times daily., Disp: , Rfl:    ascorbic acid (VITAMIN C) 500 MG tablet, Take 500 mg by mouth in the morning., Disp: , Rfl:    aspirin EC 81 MG tablet, Take 81 mg by mouth at bedtime. , Disp: , Rfl:    buPROPion (WELLBUTRIN XL) 150 MG 24 hr tablet, TAKE ONE TABLET BY MOUTH DAILY, Disp: 30 tablet, Rfl: 5   busPIRone (BUSPAR) 10 MG tablet, Take 1 tablet (10 mg total) by mouth daily., Disp: 30 tablet, Rfl: 5   Cholecalciferol (VITAMIN D3) 50 MCG (2000 UT) TABS, Take 2,000 Units by mouth in the morning., Disp: , Rfl:    citalopram (CELEXA) 20 MG tablet, TAKE ONE TABLET BY MOUTH ONCE DAILY, Disp: 90 tablet, Rfl: 3   clobetasol ointment (TEMOVATE) 0.05 %, Apply a pea sized amount BID for 2 weeks, then  apply 2 x a week for the next month, Disp: 60 g, Rfl: 0   ezetimibe (ZETIA) 10 MG tablet, Take 1 tablet (10 mg total) by mouth daily., Disp: 90 tablet, Rfl: 3   gabapentin (NEURONTIN) 100 MG capsule, Take 1 capsule (100 mg total) by mouth at bedtime., Disp: 30 capsule, Rfl: 0   meloxicam (MOBIC) 7.5 MG tablet, Take 1 tablet (7.5 mg total) by mouth daily. (Patient taking differently: Take 7.5 mg by mouth daily as needed for pain.), Disp: 90 tablet, Rfl: 0   pantoprazole (PROTONIX) 20 MG tablet, TAKE ONE TABLET BY MOUTH DAILY, Disp: 90 tablet, Rfl: 3   rosuvastatin (CRESTOR) 40 MG tablet, TAKE ONE TABLET BY MOUTH DAILY, Disp: 90 tablet, Rfl: 3   Sodium Sulfate-Mag Sulfate-KCl (SUTAB) [id number] MG TABS, Take as directed on colonoscopy instructions, Disp: 24 tablet, Rfl: 0   TURMERIC PO, Take 1,500 mg by mouth daily., Disp: , Rfl:    vitamin B-12 (CYANOCOBALAMIN) 1000 MCG tablet, Take 1,000 mcg by mouth in the morning., Disp: , Rfl:    Objective:     There were no vitals filed for this visit.    There is no height or weight on file to calculate BMI.    Physical Exam:    ***   Electronically  signed by:  Crystal Carroll D.[facility] 7:37 AM 08/23/22

## 2022-08-28 ENCOUNTER — Ambulatory Visit: Payer: PPO | Admitting: Sports Medicine

## 2022-08-28 VITALS — BP 126/80 | HR 67 | Ht 64.0 in

## 2022-08-28 DIAGNOSIS — M25531 Pain in right wrist: Secondary | ICD-10-CM | POA: Diagnosis not present

## 2022-08-28 NOTE — Patient Instructions (Addendum)
Good to see you  Refer to PT placed they will call you to schedule Recommend using the brace when physical active at home or running errands for the next two weeks Can stop using brace after two weeks Follow up In 4 weeks

## 2022-09-01 ENCOUNTER — Other Ambulatory Visit: Payer: Self-pay | Admitting: Family Medicine

## 2022-09-06 ENCOUNTER — Other Ambulatory Visit: Payer: Self-pay

## 2022-09-06 MED ORDER — BUPROPION HCL ER (XL) 150 MG PO TB24: 150.0000 mg | ORAL_TABLET | Freq: Every day | ORAL | 5 refills | Status: DC

## 2022-09-24 NOTE — Progress Notes (Signed)
Crystal Carroll is a 77 y.o. female here for a new problem.  History of Present Illness:   No chief complaint on file.   HPI Rash (Abdominal): Complains of abdominal that appeared *** ago.  ***  ***  ***  Past Medical History:  Diagnosis Date   Anxiety    Aortic atherosclerosis (HCC)    Aortic valvar stenosis 11/2018   Mild, noted on ECHO   Arthritis    cervical neck   CKD (chronic kidney disease), stage III (HCC)    Stable   GERD (gastroesophageal reflux disease)    History of TIA (transient ischemic attack)    per patient, didn't know about it   Hyperlipidemia    Hyperparathyroidism (HCC)    Hypertension    History of    Junctional rhythm    Osteoporosis    Palpitations    Thyroid nodule    2.0 cm solid nodule in the inferior left thyroid lobe      Social History   Tobacco Use   Smoking status: Former    Current packs/day: 0.00    Average packs/day: 0.8 packs/day for 50.0 years (37.5 ttl pk-yrs)    Types: Cigarettes    Start date: 03/08/1963    Quit date: 03/07/2013    Years since quitting: 9.5   Smokeless tobacco: Never  Vaping Use   Vaping status: Never Used  Substance Use Topics   Alcohol use: Yes    Comment: 1-2 daily spritzers per pt   Drug use: No    Past Surgical History:  Procedure Laterality Date   CERVICAL LAMINECTOMY  2004   Delma Officer   COLONOSCOPY  05/31/2014   Arlyce Dice   COLONOSCOPY WITH PROPOFOL N/A 02/14/2022   Procedure: COLONOSCOPY WITH PROPOFOL;  Surgeon: Jenel Lucks, MD;  Location: [facility] ENDOSCOPY;  Service: Gastroenterology;  Laterality: N/A;   ESOPHAGOGASTRODUODENOSCOPY     PARATHYROIDECTOMY Left 01/13/2019   Procedure: LEFT PARATHYROIDECTOMY;  Surgeon: Darnell Level, MD;  Location: [facility] ORS;  Service: General;  Laterality: Left;   POLYPECTOMY  02/14/2022   Procedure: POLYPECTOMY;  Surgeon: Jenel Lucks, MD;  Location: [facility] ENDOSCOPY;  Service: Gastroenterology;;   THYROID LOBECTOMY Left 01/13/2019   Procedure: LEFT  THYROID LOBECTOMY;  Surgeon: Darnell Level, MD;  Location: [facility] ORS;  Service: General;  Laterality: Left;    Family History  Problem Relation Age of Onset   Melanoma Mother 13       labia   Heart attack Father 77       smoker, rheumatic fever as child   Colon cancer Neg Hx    Colon polyps Neg Hx    Esophageal cancer Neg Hx    Rectal cancer Neg Hx    Stomach cancer Neg Hx     Allergies  Allergen Reactions   Codeine Nausea Only   Sulfamethoxazole     GI upset    Current Medications:   Current Outpatient Medications:    acetaminophen (TYLENOL) 500 MG tablet, Take 1,000 mg by mouth 2 (two) times daily., Disp: , Rfl:    ascorbic acid (VITAMIN C) 500 MG tablet, Take 500 mg by mouth in the morning., Disp: , Rfl:    aspirin EC 81 MG tablet, Take 81 mg by mouth at bedtime. , Disp: , Rfl:    buPROPion (WELLBUTRIN XL) 150 MG 24 hr tablet, Take 1 tablet (150 mg total) by mouth daily., Disp: 30 tablet, Rfl: 5   busPIRone (BUSPAR) 10 MG tablet, Take 1 tablet (10  mg total) by mouth daily., Disp: 30 tablet, Rfl: 5   Cholecalciferol (VITAMIN D3) 50 MCG (2000 UT) TABS, Take 2,000 Units by mouth in the morning., Disp: , Rfl:    citalopram (CELEXA) 20 MG tablet, TAKE ONE TABLET BY MOUTH ONCE DAILY, Disp: 90 tablet, Rfl: 3   clobetasol ointment (TEMOVATE) 0.05 %, Apply a pea sized amount BID for 2 weeks, then apply 2 x a week for the next month, Disp: 60 g, Rfl: 0   ezetimibe (ZETIA) 10 MG tablet, Take 1 tablet (10 mg total) by mouth daily., Disp: 90 tablet, Rfl: 3   gabapentin (NEURONTIN) 100 MG capsule, Take 1 capsule (100 mg total) by mouth at bedtime., Disp: 30 capsule, Rfl: 0   meloxicam (MOBIC) 7.5 MG tablet, Take 1 tablet (7.5 mg total) by mouth daily. (Patient taking differently: Take 7.5 mg by mouth daily as needed for pain.), Disp: 90 tablet, Rfl: 0   pantoprazole (PROTONIX) 20 MG tablet, TAKE ONE TABLET BY MOUTH DAILY, Disp: 90 tablet, Rfl: 3   rosuvastatin (CRESTOR) 40 MG tablet, TAKE ONE  TABLET BY MOUTH DAILY, Disp: 90 tablet, Rfl: 3   TURMERIC PO, Take 1,500 mg by mouth daily., Disp: , Rfl:    vitamin B-12 (CYANOCOBALAMIN) 1000 MCG tablet, Take 1,000 mcg by mouth in the morning., Disp: , Rfl:    Review of Systems:   ROS  Vitals:   There were no vitals filed for this visit.   There is no height or weight on file to calculate BMI.  Physical Exam:   Physical Exam  Assessment and Plan:   There are no diagnoses linked to this encounter.  I,Emily Lagle,acting as a Neurosurgeon for [employer], PA.,have documented all relevant documentation on the behalf of Jarold Motto, PA,as directed by  Jarold Motto, PA while in the presence of Jarold Motto, [place].  *** (refresh reminder)  I, Jarold Motto, PA, have reviewed all documentation for this visit. The documentation on 09/24/22 for the exam, diagnosis, procedures, and orders are all accurate and complete.  Jarold Motto, PA-C

## 2022-09-24 NOTE — Progress Notes (Deleted)
Crystal Carroll D.[name] [address] [place] [address] Phone: [phone]   Assessment and Plan:     There are no diagnoses linked to this encounter.  ***   Pertinent previous records reviewed include ***   Follow Up: ***     Subjective:   I, Crystal Carroll, am serving as a Neurosurgeon for Doctor Richardean Sale  Chief Complaint: right wrist pain    HPI:    07/16/22 Patient is a 77 year old female complaining of right wrist pain. Patient states that she fell 2 weeks ago she fell in the bathroom on her carpet. She fell backwards.  she went to the ED and they took xrays and they told her she had a bad sprain. Has bruising , decreased Rom due to pain . Decreased grip strength, is able to move her fingers. Tylenol for the pain and that helps.    07/31/2022 Patient states that she is better but the wrist looks funny and still has some pain and has some point tenderness    08/28/2022 Patient states that her wrist is doing well, been wearing her brace. Patient states still has some soreness but ROM is a lot better.   09/25/2022 Patient states    Relevant Historical Information:  Hypertension, osteoporosis, CKD stage III, history of CVA  Additional pertinent review of systems negative.   Current Outpatient Medications:    acetaminophen (TYLENOL) 500 MG tablet, Take 1,000 mg by mouth 2 (two) times daily., Disp: , Rfl:    ascorbic acid (VITAMIN C) 500 MG tablet, Take 500 mg by mouth in the morning., Disp: , Rfl:    aspirin EC 81 MG tablet, Take 81 mg by mouth at bedtime. , Disp: , Rfl:    buPROPion (WELLBUTRIN XL) 150 MG 24 hr tablet, Take 1 tablet (150 mg total) by mouth daily., Disp: 30 tablet, Rfl: 5   busPIRone (BUSPAR) 10 MG tablet, Take 1 tablet (10 mg total) by mouth daily., Disp: 30 tablet, Rfl: 5   Cholecalciferol (VITAMIN D3) 50 MCG (2000 UT) TABS, Take 2,000 Units by mouth in the morning., Disp: , Rfl:    citalopram (CELEXA)  20 MG tablet, TAKE ONE TABLET BY MOUTH ONCE DAILY, Disp: 90 tablet, Rfl: 3   clobetasol ointment (TEMOVATE) 0.05 %, Apply a pea sized amount BID for 2 weeks, then apply 2 x a week for the next month, Disp: 60 g, Rfl: 0   ezetimibe (ZETIA) 10 MG tablet, Take 1 tablet (10 mg total) by mouth daily., Disp: 90 tablet, Rfl: 3   gabapentin (NEURONTIN) 100 MG capsule, Take 1 capsule (100 mg total) by mouth at bedtime., Disp: 30 capsule, Rfl: 0   meloxicam (MOBIC) 7.5 MG tablet, Take 1 tablet (7.5 mg total) by mouth daily. (Patient taking differently: Take 7.5 mg by mouth daily as needed for pain.), Disp: 90 tablet, Rfl: 0   pantoprazole (PROTONIX) 20 MG tablet, TAKE ONE TABLET BY MOUTH DAILY, Disp: 90 tablet, Rfl: 3   rosuvastatin (CRESTOR) 40 MG tablet, TAKE ONE TABLET BY MOUTH DAILY, Disp: 90 tablet, Rfl: 3   TURMERIC PO, Take 1,500 mg by mouth daily., Disp: , Rfl:    vitamin B-12 (CYANOCOBALAMIN) 1000 MCG tablet, Take 1,000 mcg by mouth in the morning., Disp: , Rfl:    Objective:     There were no vitals filed for this visit.    There is no height or weight on file to calculate BMI.  Physical Exam:    ***   Electronically signed by:  Crystal Carroll [name] 7:10 AM 09/24/22

## 2022-09-25 ENCOUNTER — Ambulatory Visit: Payer: PPO | Admitting: Sports Medicine

## 2022-09-26 ENCOUNTER — Ambulatory Visit (HOSPITAL_COMMUNITY)
Admission: RE | Admit: 2022-09-26 | Discharge: 2022-09-26 | Disposition: A | Discharge status: Home / Self Care | Payer: PPO | Source: Ambulatory Visit | Attending: Physician Assistant | Admitting: Physician Assistant

## 2022-09-26 ENCOUNTER — Encounter: Payer: Self-pay | Admitting: Physician Assistant

## 2022-09-26 ENCOUNTER — Ambulatory Visit (INDEPENDENT_AMBULATORY_CARE_PROVIDER_SITE_OTHER): Payer: PPO | Admitting: Physician Assistant

## 2022-09-26 VITALS — BP 130/70 | HR 65 | Temp 97.3°F | Ht 64.0 in | Wt 151.0 lb

## 2022-09-26 DIAGNOSIS — R21 Rash and other nonspecific skin eruption: Secondary | ICD-10-CM

## 2022-09-26 DIAGNOSIS — M79605 Pain in left leg: Secondary | ICD-10-CM | POA: Diagnosis not present

## 2022-09-26 MED ORDER — KETOCONAZOLE 2 % EX CREA: TOPICAL_CREAM | CUTANEOUS | 1 refills | Status: DC

## 2022-09-26 MED ORDER — CLOBETASOL PROPIONATE 0.05 % EX OINT: TOPICAL_OINTMENT | CUTANEOUS | 0 refills | Status: DC

## 2022-09-26 NOTE — Patient Instructions (Signed)
It was great to see you!  Stop using clobetasol on the rash Start ketoconazole twice daily on the rash  Please get ultrasound as advised to rule out blood clot   Take care,  Jarold Motto PA-C

## 2022-10-04 ENCOUNTER — Encounter: Payer: Self-pay | Admitting: Physician Assistant

## 2022-10-11 NOTE — Progress Notes (Deleted)
Aleen Sells D.[name] [address] [place] [address] Phone: [phone]   Assessment and Plan:     There are no diagnoses linked to this encounter.  ***   Pertinent previous records reviewed include ***   Follow Up: ***     Subjective:   I, Biruk Troia, am serving as a Neurosurgeon for Doctor Richardean Sale   Chief Complaint: right wrist pain    HPI:    07/16/22 Patient is a 77 year old female complaining of right wrist pain. Patient states that she fell 2 weeks ago she fell in the bathroom on her carpet. She fell backwards.  she went to the ED and they took xrays and they told her she had a bad sprain. Has bruising , decreased Rom due to pain . Decreased grip strength, is able to move her fingers. Tylenol for the pain and that helps.    07/31/2022 Patient states that she is better but the wrist looks funny and still has some pain and has some point tenderness    08/28/2022 Patient states that her wrist is doing well, been wearing her brace. Patient states still has some soreness but ROM is a lot better.    10/14/2022 Patient states   Relevant Historical Information:  Hypertension, osteoporosis, CKD stage III, history of CVA  Additional pertinent review of systems negative.   Current Outpatient Medications:    acetaminophen (TYLENOL) 500 MG tablet, Take 1,000 mg by mouth 2 (two) times daily., Disp: , Rfl:    ascorbic acid (VITAMIN C) 500 MG tablet, Take 500 mg by mouth in the morning., Disp: , Rfl:    aspirin EC 81 MG tablet, Take 81 mg by mouth at bedtime. , Disp: , Rfl:    buPROPion (WELLBUTRIN XL) 150 MG 24 hr tablet, Take 1 tablet (150 mg total) by mouth daily., Disp: 30 tablet, Rfl: 5   busPIRone (BUSPAR) 10 MG tablet, Take 1 tablet (10 mg total) by mouth daily., Disp: 30 tablet, Rfl: 5   Cholecalciferol (VITAMIN D3) 50 MCG (2000 UT) TABS, Take 2,000 Units by mouth in the morning., Disp: , Rfl:    citalopram (CELEXA)  20 MG tablet, TAKE ONE TABLET BY MOUTH ONCE DAILY, Disp: 90 tablet, Rfl: 3   clobetasol ointment (TEMOVATE) 0.05 %, Apply a pea sized amount BID for 2 weeks, then apply 2 x a week for the next month, Disp: 60 g, Rfl: 0   ezetimibe (ZETIA) 10 MG tablet, Take 1 tablet (10 mg total) by mouth daily., Disp: 90 tablet, Rfl: 3   gabapentin (NEURONTIN) 100 MG capsule, Take 1 capsule (100 mg total) by mouth at bedtime., Disp: 30 capsule, Rfl: 0   ketoconazole (NIZORAL) 2 % cream, Apply to affected area 1-2 times daily, Disp: 60 g, Rfl: 1   meloxicam (MOBIC) 7.5 MG tablet, Take 1 tablet (7.5 mg total) by mouth daily. (Patient taking differently: Take 7.5 mg by mouth daily as needed for pain.), Disp: 90 tablet, Rfl: 0   pantoprazole (PROTONIX) 20 MG tablet, TAKE ONE TABLET BY MOUTH DAILY, Disp: 90 tablet, Rfl: 3   rosuvastatin (CRESTOR) 40 MG tablet, TAKE ONE TABLET BY MOUTH DAILY, Disp: 90 tablet, Rfl: 3   TURMERIC PO, Take 1,500 mg by mouth daily., Disp: , Rfl:    vitamin B-12 (CYANOCOBALAMIN) 1000 MCG tablet, Take 1,000 mcg by mouth in the morning., Disp: , Rfl:    Objective:     There were no vitals  filed for this visit.    There is no height or weight on file to calculate BMI.    Physical Exam:    ***   Electronically signed by:  Aleen Sells D.[name] 7:40 AM 10/11/22

## 2022-10-14 ENCOUNTER — Ambulatory Visit: Payer: PPO | Admitting: Sports Medicine

## 2022-10-28 DIAGNOSIS — D1801 Hemangioma of skin and subcutaneous tissue: Secondary | ICD-10-CM | POA: Diagnosis not present

## 2022-10-28 DIAGNOSIS — L821 Other seborrheic keratosis: Secondary | ICD-10-CM | POA: Diagnosis not present

## 2022-10-28 DIAGNOSIS — L304 Erythema intertrigo: Secondary | ICD-10-CM | POA: Diagnosis not present

## 2022-10-28 DIAGNOSIS — D225 Melanocytic nevi of trunk: Secondary | ICD-10-CM | POA: Diagnosis not present

## 2022-10-28 DIAGNOSIS — L814 Other melanin hyperpigmentation: Secondary | ICD-10-CM | POA: Diagnosis not present

## 2022-11-21 ENCOUNTER — Telehealth: Payer: Self-pay | Admitting: Family Medicine

## 2022-11-21 NOTE — Telephone Encounter (Signed)
LVM to get pt scheduled

## 2022-11-21 NOTE — Telephone Encounter (Signed)
Please scheduled with available provider.

## 2022-11-21 NOTE — Telephone Encounter (Signed)
Sent to [facility]: Unfortunately, we have no openings to move this appointment up. We can offer pt to see another provider.   Dr Durene Cal..my right foot is swollen from my toes to my ankle?? my right hand also seems to be swollen also.. I am out of balance as I was when my thyroid was "messed up" before the surgery?? I could not get an appointment with you until December 13..what should be done for me now..thanks..I am definitely concerned about this and by the way I am the poster child for STRESS.Crystal Carroll is in skilled nursing.

## 2022-12-03 ENCOUNTER — Other Ambulatory Visit: Payer: Self-pay | Admitting: Family Medicine

## 2022-12-20 ENCOUNTER — Ambulatory Visit (INDEPENDENT_AMBULATORY_CARE_PROVIDER_SITE_OTHER): Payer: PPO | Admitting: Family Medicine

## 2022-12-20 ENCOUNTER — Encounter: Payer: Self-pay | Admitting: Family Medicine

## 2022-12-20 VITALS — BP 140/70 | HR 76 | Temp 98.0°F | Ht 64.0 in | Wt 149.4 lb

## 2022-12-20 DIAGNOSIS — R600 Localized edema: Secondary | ICD-10-CM | POA: Diagnosis not present

## 2022-12-20 DIAGNOSIS — E785 Hyperlipidemia, unspecified: Secondary | ICD-10-CM

## 2022-12-20 DIAGNOSIS — Z636 Dependent relative needing care at home: Secondary | ICD-10-CM | POA: Diagnosis not present

## 2022-12-20 DIAGNOSIS — I10 Essential (primary) hypertension: Secondary | ICD-10-CM | POA: Diagnosis not present

## 2022-12-20 DIAGNOSIS — E538 Deficiency of other specified B group vitamins: Secondary | ICD-10-CM

## 2022-12-20 DIAGNOSIS — M79604 Pain in right leg: Secondary | ICD-10-CM | POA: Diagnosis not present

## 2022-12-20 NOTE — Progress Notes (Signed)
Phone [phone] In person visit   Subjective:   Crystal Carroll is a 77 y.o. year old very pleasant female patient who presents for/with See problem oriented charting Chief Complaint  Patient presents with   balance issues    Pt c/o balance issues that started a month ago and its like it was before she had thyroid surgery a few years ago.   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 02/24/2019    Priority: High   Hyperparathyroidism, primary (HCC) 07/17/2018    Priority: High   History of lacunar cerebrovascular accident (CVA) 06/18/2018    Priority: High   Caregiver burden 04/03/2016    Priority: High   Mild aortic stenosis 07/19/2019    Priority: Medium    Osteoporosis 03/13/2018    Priority: Medium    DDD (degenerative disc disease), lumbar 03/05/2018    Priority: Medium    Aortic atherosclerosis (HCC) 06/12/2017    Priority: Medium    Palpitations 05/12/2017    Priority: Medium    Vitamin B12 deficiency 05/12/2017    Priority: Medium    Cervical radiculopathy 04/03/2016    Priority: Medium    Essential tremor 04/18/2015    Priority: Medium    CKD (chronic kidney disease), stage III (HCC) 10/15/2013    Priority: Medium    Former smoker 12/24/2006    Priority: Medium    Hyperlipidemia 05/26/2006    Priority: Medium    GAD (generalized anxiety disorder) 05/26/2006    Priority: Medium    Essential hypertension 05/26/2006    Priority: Medium    Patellofemoral arthritis 03/05/2018    Priority: Low   Unstable gait 01/20/2018    Priority: Low   Benign neoplasm of colon 06/14/2014    Priority: Low   H/O cold sores 10/15/2013    Priority: Low   GERD 05/26/2006    Priority: Low     Medications- reviewed and updated Current Outpatient Medications  Medication Sig Dispense Refill   acetaminophen (TYLENOL) 500 MG tablet Take 1,000 mg by mouth 2 (two) times daily.     ascorbic acid (VITAMIN C) 500 MG tablet Take 500 mg by mouth in the morning.     aspirin EC 81 MG tablet Take 81 mg by mouth at bedtime.      buPROPion (WELLBUTRIN XL) 150 MG 24 hr tablet Take 1 tablet (150 mg total) by mouth daily. 30 tablet 5   busPIRone (BUSPAR) 10 MG tablet Take 1 tablet (10 mg total) by mouth daily. 30 tablet 5   Cholecalciferol (VITAMIN D3) 50 MCG (2000 UT) TABS Take 2,000 Units by mouth in the morning.     citalopram (CELEXA) 20 MG tablet TAKE ONE TABLET BY MOUTH ONCE DAILY 90 tablet 3   clobetasol ointment (TEMOVATE) 0.05 % Apply a pea sized amount BID for 2 weeks, then apply 2 x a week for the next month 60 g 0   ezetimibe (ZETIA) 10 MG tablet Take 1 tablet (10 mg total) by mouth daily. 90 tablet 3   gabapentin (NEURONTIN) 100 MG capsule Take 1 capsule (100 mg total) by mouth at bedtime. 30 capsule 0   ketoconazole (NIZORAL) 2 % cream Apply to affected area 1-2 times daily  60 g 1   pantoprazole (PROTONIX) 20 MG tablet TAKE ONE TABLET BY MOUTH DAILY 90 tablet 3   rosuvastatin (CRESTOR) 40 MG tablet TAKE ONE TABLET BY MOUTH DAILY 90 tablet 3   TURMERIC PO Take 1,500 mg by mouth daily.     vitamin B-12 (CYANOCOBALAMIN) 1000 MCG tablet Take 1,000 mcg by mouth in the morning.     No current facility-administered medications for this visit.     Objective:  BP (!) 140/70   Pulse 76   Temp 98 F (36.7 C)   Ht 5\' 4"  (1.626 m)   Wt 149 lb 6.4 oz (67.8 kg)   LMP  (LMP Unknown)   SpO2 98%   BMI 25.64 kg/m  Gen: NAD, resting comfortably CV: RRR no murmurs rubs or gallops Lungs: CTAB no crackles, wheeze, rhonchi Ext: no edema on the left, on the right 1+ pitting edema similar into the foot-patient is very tender with all of the areas of swelling.  Also has calf pain with palpation  not present on the left leg Skin: warm, dry     Assessment and Plan   # Social update-husband Industry on hospice December 2024, son unfortunately had a heart attack  # Right foot pain/swelling S:has noted some puffiness in right foot and when she pushes on the area it hurts diffusely- fell about a month ago- had dog in passenger seat and went to pick dog up and lost balance and instead of protecting herself in the fall she choose to help dog land safely- bruised left hip and may have hit right foot. She called 911   She reports she did not immediately notice pain or puffiness in the foot but developed since that time-she cannot find a distinct area of pain but instead the foot diffusely hurts when pressed on-extends up into her lower leg A/P: I cannot find a discrete area of pain but we are going to get an x-ray of the foot to rule out fracture.  Without discrete area of pain I also worry about clot-she does have some pain into her calf with palpation that is not present on the left leg-for that reason we ordered a stat venous duplex-unfortunately her visit was on Friday late in the afternoon so we are unable to get this done-we discussed emergent precautions to go into the emergency room such as worsening pain or swelling or certainly chest pain or shortness of breath  # Balance issues S:patient reports balance issues recurrent. Started January 2020- stat head CT to rule out subdural hematoma- had also at that time seemed to have lower extremity weakness and numbness. MRI brain was done to rule out cerebellar cerebrovascular accident- none detected- prior lacunar stroke nad aspirin was started. Saw Dr. Everlena Cooper July 28, 2018 and had MRI cervical spine- and lumbar spine- disc bulges on cervical spine and spinal stenosis in lumbar spine- neurology recommended neurosurgery or orthopedics referral. Thankfully symptoms improved with time and without intervention  But is noted she feels like this has been  somewhat worse lately but thankfully not as bad as 2020. Still able to walk her dog twice a day which is a great sign-she does use a cane. Financial strain so doing more for herself to cover this. Sometimes elbow down hurts and tingling in fingers when wakes up in morning.  A/P: I am concerned the balance issue could be worsening of her lumbar spinal stenosis-she declines to move forward with MRI of the lumbar spine at this time  but she will reach out if she changes her mind - As far as the intermittent symptoms in her forearms and hands I wonder if this could be related to her cervical spine disc bulges from the past-seems to be rather minor at the moment -Does have history of B12 deficiency and we will check that to make sure adequately repleted over 400 -I do not feel strongly about repeating MRI of the brain with gradual not acutely worsened   #Hyperlipidemia/aortic atherosclerosis/ history of stroke- on aspirin 81 mg with LDL goal under 70 S: Compliant with rosuvastatin 40 mg and Zetia 10 mg.   Lab Results  Component Value Date   CHOL 146 08/23/2021   HDL 65.40 08/23/2021   LDLCALC 53 08/23/2021   LDLDIRECT 106 (H) 07/19/2016   TRIG 142.0 08/23/2021   CHOLHDL 2 08/23/2021  A/P: Lipids are at goal with LDL under 70 on last check-update lipid panel when she comes back for labs-ordered today  #Hypertension/CKD stage III S: Patient remains off lisinopril fortunately blood pressure remains controlled  BP Readings from Last 3 Encounters:  12/20/22 (!) 140/70  09/26/22 130/70  08/28/22 126/80  A/P: Blood pressure typically well-controlled even without medicine-I am cautious about adding medicine with her imbalance-instead she agreed to do some home monitoring and remain off medicine for now  CKD stage III-hopefully stable-update CMP when she comes back for labs   #Anxiety/stress management S: Compliant with Celexa 20 mg daily , Wellbutrin 150mg  XR (reduced from 300mg  due to anxiety  concern). Buspirone 7.5 mg BID has been helpful thankfully -Prior on xanax but had started to feel oversedated.  A/P: Patient under significant stress with husband's declining health on hospice, heart attack ain  her son recently but reports doing overall pretty well all things considered with these medications-we opted to continue current medication  Recommended follow up: Return in about 4 weeks (around 01/17/2023) for followup or sooner if needed.Schedule b4 you leave. No future appointments.  Lab/Order associations:   ICD-10-CM   1. Leg edema, right  R60.0     2. Right leg pain  M79.604 DG Foot Complete Right    VAS [place] LOWER EXTREMITY VENOUS (DVT)    3. B12 deficiency  E53.8 Vitamin B12    4. Hyperlipidemia, unspecified hyperlipidemia type  E78.5 Comprehensive metabolic panel    CBC with Differential/Platelet    Lipid panel    TSH    5. Caregiver burden  Z63.6     6. Essential hypertension  I10      No orders of the defined types were placed in this encounter.   Return precautions advised.  Tana Conch, MD

## 2022-12-20 NOTE — Patient Instructions (Addendum)
Team ordered stat duplex of the right leg to rule out clot-  -likely not until Monday so if you have shortness of breath, chest pain please seek care immediately  Please go to [facility]  central [facility] (updated 03/04/2019) - located [address] across the street from [facility] - in the basement - Hours: 8:30-5:00 PM M-F (with lunch from 12:30- 1 PM). You do NOT need an appointment.    Schedule a lab visit at the check out desk within 2 weeks. Return for future fasting labs meaning nothing but water after midnight please. Ok to take your medications with water.   If you become ready to do MRI lumbar spine with balance issues let me know and I will order to follow up on lumbar spine stenosis  Omron series 3 - check with pharmacy on price Your blood pressure is higher than I would like in office. I would like for you to buy/use a home cuff to check at least 4x a week. Your goal is <135/85.  Update me in 2-3 weeks by mychart.   Recommended follow up: Return in about 4 weeks (around 01/17/2023) for followup or sooner if needed.Schedule b4 you leave.

## 2022-12-23 ENCOUNTER — Ambulatory Visit (HOSPITAL_COMMUNITY)
Admission: RE | Admit: 2022-12-23 | Discharge: 2022-12-23 | Disposition: A | Discharge status: Home / Self Care | Payer: PPO | Source: Ambulatory Visit | Attending: Family Medicine | Admitting: Family Medicine

## 2022-12-23 DIAGNOSIS — M79604 Pain in right leg: Secondary | ICD-10-CM | POA: Insufficient documentation

## 2023-01-15 ENCOUNTER — Ambulatory Visit
Admission: RE | Admit: 2023-01-15 | Discharge: 2023-01-15 | Disposition: A | Discharge status: Home / Self Care | Payer: PPO | Source: Ambulatory Visit | Attending: Family Medicine | Admitting: Family Medicine

## 2023-01-15 DIAGNOSIS — M79604 Pain in right leg: Secondary | ICD-10-CM

## 2023-01-15 DIAGNOSIS — M79671 Pain in right foot: Secondary | ICD-10-CM | POA: Diagnosis not present

## 2023-01-15 DIAGNOSIS — M7989 Other specified soft tissue disorders: Secondary | ICD-10-CM | POA: Diagnosis not present

## 2023-01-16 ENCOUNTER — Encounter: Payer: Self-pay | Admitting: Family Medicine

## 2023-01-17 ENCOUNTER — Other Ambulatory Visit: Payer: Self-pay

## 2023-01-17 DIAGNOSIS — M79671 Pain in right foot: Secondary | ICD-10-CM

## 2023-01-22 NOTE — Progress Notes (Signed)
Aleen Sells D.[name] [address] [place] [address] Phone: [phone]   Assessment and Plan:     1. Inversion sprain of right ankle, initial encounter 2. Arthritis of right midfoot -Chronic with exacerbation, initial sports medicine visit - Midfoot and lateral ankle pain most consistent with flare of midfoot osteoarthritis, and recurrent irritation from lateral ankle sprain - Reviewed x-ray with patient in clinic and discussed areas of osteoarthritis.  No acute fracture or dislocation - Due to age and comorbidities, we would like to limit steroid and NSAID use.  Patient has responded well to prednisone Dosepaks in the past and elected for additional Dosepak today.  Prescription provided - Start Tylenol 500 to 1000 mg tablets 2-3 times a day for day-to-day pain relief - Continue using Voltaren gel over areas of pain - Start HEP for ankle - Recommend starting physical therapy.  Patient's husband recently passed away, so she is not currently at a place where she is comfortable starting physical therapy, though would consider starting in the future.  Can call and ask for physical therapy referral at any time - Reassuring the patient had   vascular ultrasound of right lower extremity negative for DVT   Pertinent previous records reviewed include right foot x-ray 01/15/2023, family medicine note 12/20/2022, vascular ultrasound 12/23/2022  15 additional minutes spent for educating Therapeutic Home Exercise Program.  This included exercises focusing on stretching, strengthening, with focus on eccentric aspects.   Long term goals include an improvement in range of motion, strength, endurance as well as avoiding reinjury. Patient's frequency would include in 1-2 times a day, 3-5 times a week for a duration of 6-12 weeks. Proper technique shown and discussed handout in great detail with ATC.  All questions were discussed and answered.    Follow Up: 2 to  3 weeks for reevaluation.  Could consider CSI versus physical therapy if no improvement or worsening of symptoms   Subjective:   I, [name], am serving as a Neurosurgeon for Doctor Richardean Sale  Chief Complaint: right foot pain   HPI:   01/23/2023 Patient is a 78 year old female with right foot pain. Patient states right foot pain for some months. DVT negative, xrays negative. Doesn't remember a MOI, but maybe rolled it sometime ago. Walks with cane per usual. Decreased ROM .   Left knee pain but thinks its from the weather  Relevant Historical Information: Hypertension, osteoporosis, CKD stage III, history of CVA   Additional pertinent review of systems negative.   Current Outpatient Medications:    acetaminophen (TYLENOL) 500 MG tablet, Take 1,000 mg by mouth 2 (two) times daily., Disp: , Rfl:    ascorbic acid (VITAMIN C) 500 MG tablet, Take 500 mg by mouth in the morning., Disp: , Rfl:    aspirin EC 81 MG tablet, Take 81 mg by mouth at bedtime. , Disp: , Rfl:    buPROPion (WELLBUTRIN XL) 150 MG 24 hr tablet, Take 1 tablet (150 mg total) by mouth daily., Disp: 30 tablet, Rfl: 5   busPIRone (BUSPAR) 10 MG tablet, Take 1 tablet (10 mg total) by mouth daily., Disp: 30 tablet, Rfl: 5   Cholecalciferol (VITAMIN D3) 50 MCG (2000 UT) TABS, Take 2,000 Units by mouth in the morning., Disp: , Rfl:    citalopram (CELEXA) 20 MG tablet, TAKE ONE TABLET BY MOUTH ONCE DAILY, Disp: 90 tablet, Rfl: 3   clobetasol ointment (TEMOVATE) 0.05 %, Apply a pea sized amount  BID for 2 weeks, then apply 2 x a week for the next month, Disp: 60 g, Rfl: 0   ezetimibe (ZETIA) 10 MG tablet, Take 1 tablet (10 mg total) by mouth daily., Disp: 90 tablet, Rfl: 3   gabapentin (NEURONTIN) 100 MG capsule, Take 1 capsule (100 mg total) by mouth at bedtime., Disp: 30 capsule, Rfl: 0   ketoconazole (NIZORAL) 2 % cream, Apply to affected area 1-2 times daily, Disp: 60 g, Rfl: 1   methylPREDNISolone (MEDROL DOSEPAK) 4 MG  TBPK tablet, Take 6 tablets on day 1.  Take 5 tablets on day 2.  Take 4 tablets on day 3.  Take 3 tablets on day 4.  Take 2 tablets on day 5.  Take 1 tablet on day 6., Disp: 21 tablet, Rfl: 0   pantoprazole (PROTONIX) 20 MG tablet, TAKE ONE TABLET BY MOUTH DAILY, Disp: 90 tablet, Rfl: 3   rosuvastatin (CRESTOR) 40 MG tablet, TAKE ONE TABLET BY MOUTH DAILY, Disp: 90 tablet, Rfl: 3   TURMERIC PO, Take 1,500 mg by mouth daily., Disp: , Rfl:    vitamin B-12 (CYANOCOBALAMIN) 1000 MCG tablet, Take 1,000 mcg by mouth in the morning., Disp: , Rfl:    Objective:     Vitals:   01/23/23 1431  BP: 128/80  Pulse: 71  SpO2: 98%  Weight: 149 lb (67.6 kg)  Height: 5\' 4"  (1.626 m)      Body mass index is 25.58 kg/m.    Physical Exam:    Gen: Appears well, nad, nontoxic and pleasant Psych: Alert and oriented, appropriate mood and affect Neuro: sensation intact, strength is 5/5 with df/pf/inv/ev, muscle tone wnl Skin: no susupicious lesions or rashes  Right foot/ankle:  No deformity, 1+ pitting edema along distal anterior shin, anterior foot TTP ATFL, CFL, midfoot NTTP over fibular head, lat mal, medial mal, achilles, navicular, base of 5th,   deltoid, calcaneous or   ROM DF 30, PF 45, inv/ev intact.  Lateral ankle pain with inversion Negative ant drawer, talar tilt, rotation test, squeeze test. Neg thompson No pain with resisted inversion.  Pain with resisted eversion    Electronically signed by:  Aleen Sells [name] 3:38 PM 01/23/23

## 2023-01-23 ENCOUNTER — Ambulatory Visit: Payer: PPO | Admitting: Sports Medicine

## 2023-01-23 VITALS — BP 128/80 | HR 71 | Ht 64.0 in | Wt 149.0 lb

## 2023-01-23 DIAGNOSIS — S93401A Sprain of unspecified ligament of right ankle, initial encounter: Secondary | ICD-10-CM | POA: Diagnosis not present

## 2023-01-23 DIAGNOSIS — M19071 Primary osteoarthritis, right ankle and foot: Secondary | ICD-10-CM | POA: Diagnosis not present

## 2023-01-23 MED ORDER — METHYLPREDNISOLONE 4 MG PO TBPK
ORAL_TABLET | ORAL | 0 refills | Status: DC
Start: 2023-01-23 — End: 2023-06-19

## 2023-01-23 NOTE — Patient Instructions (Signed)
Prednisone dos pak  Ankle and knee HEP  Continue Tylenol [phone] mg 2-3 times a day for pain relief  And voltaren gel  2 week follow up

## 2023-02-04 NOTE — Progress Notes (Unsigned)
    Crystal Carroll D.[name] [address] [place] [address] Phone: [phone]   Assessment and Plan:     There are no diagnoses linked to this encounter.  ***   Pertinent previous records reviewed include ***    Follow Up: ***     Subjective:   I, Alpha Mysliwiec, am serving as a Neurosurgeon for Doctor Richardean Sale   Chief Complaint: right foot pain    HPI:    01/23/2023 Patient is a 78 year old female with right foot pain. Patient states right foot pain for some months. DVT negative, xrays negative. Doesn't remember a MOI, but maybe rolled it sometime ago. Walks with cane per usual. Decreased ROM .    Left knee pain but thinks its from the weather  02/05/2023 Patient states   Relevant Historical Information: Hypertension, osteoporosis, CKD stage III, history of CVA  Additional pertinent review of systems negative.   Current Outpatient Medications:    acetaminophen (TYLENOL) 500 MG tablet, Take 1,000 mg by mouth 2 (two) times daily., Disp: , Rfl:    ascorbic acid (VITAMIN C) 500 MG tablet, Take 500 mg by mouth in the morning., Disp: , Rfl:    aspirin EC 81 MG tablet, Take 81 mg by mouth at bedtime. , Disp: , Rfl:    buPROPion (WELLBUTRIN XL) 150 MG 24 hr tablet, Take 1 tablet (150 mg total) by mouth daily., Disp: 30 tablet, Rfl: 5   busPIRone (BUSPAR) 10 MG tablet, Take 1 tablet (10 mg total) by mouth daily., Disp: 30 tablet, Rfl: 5   Cholecalciferol (VITAMIN D3) 50 MCG (2000 UT) TABS, Take 2,000 Units by mouth in the morning., Disp: , Rfl:    citalopram (CELEXA) 20 MG tablet, TAKE ONE TABLET BY MOUTH ONCE DAILY, Disp: 90 tablet, Rfl: 3   clobetasol ointment (TEMOVATE) 0.05 %, Apply a pea sized amount BID for 2 weeks, then apply 2 x a week for the next month, Disp: 60 g, Rfl: 0   ezetimibe (ZETIA) 10 MG tablet, Take 1 tablet (10 mg total) by mouth daily., Disp: 90 tablet, Rfl: 3   gabapentin (NEURONTIN) 100 MG capsule, Take 1  capsule (100 mg total) by mouth at bedtime., Disp: 30 capsule, Rfl: 0   ketoconazole (NIZORAL) 2 % cream, Apply to affected area 1-2 times daily, Disp: 60 g, Rfl: 1   methylPREDNISolone (MEDROL DOSEPAK) 4 MG TBPK tablet, Take 6 tablets on day 1.  Take 5 tablets on day 2.  Take 4 tablets on day 3.  Take 3 tablets on day 4.  Take 2 tablets on day 5.  Take 1 tablet on day 6., Disp: 21 tablet, Rfl: 0   pantoprazole (PROTONIX) 20 MG tablet, TAKE ONE TABLET BY MOUTH DAILY, Disp: 90 tablet, Rfl: 3   rosuvastatin (CRESTOR) 40 MG tablet, TAKE ONE TABLET BY MOUTH DAILY, Disp: 90 tablet, Rfl: 3   TURMERIC PO, Take 1,500 mg by mouth daily., Disp: , Rfl:    vitamin B-12 (CYANOCOBALAMIN) 1000 MCG tablet, Take 1,000 mcg by mouth in the morning., Disp: , Rfl:    Objective:     There were no vitals filed for this visit.    There is no height or weight on file to calculate BMI.    Physical Exam:    ***   Electronically signed by:  Crystal Carroll [name] 9:22 AM 02/04/23

## 2023-02-05 ENCOUNTER — Ambulatory Visit: Payer: PPO | Admitting: Sports Medicine

## 2023-02-05 ENCOUNTER — Other Ambulatory Visit: Payer: Self-pay

## 2023-02-05 VITALS — HR 74 | Ht 64.0 in | Wt 149.0 lb

## 2023-02-05 DIAGNOSIS — M19071 Primary osteoarthritis, right ankle and foot: Secondary | ICD-10-CM

## 2023-02-05 DIAGNOSIS — S93401D Sprain of unspecified ligament of right ankle, subsequent encounter: Secondary | ICD-10-CM | POA: Diagnosis not present

## 2023-02-05 NOTE — Patient Instructions (Addendum)
Recommend getting a lace  ankle brace to wear for the next 2 weeks  2 week follow up

## 2023-02-06 ENCOUNTER — Ambulatory Visit: Payer: PPO | Admitting: Sports Medicine

## 2023-02-18 NOTE — Progress Notes (Deleted)
 Crystal Carroll [name] [address] [place] [address] Phone: [phone]   Assessment and Plan:     There are no diagnoses linked to this encounter.  ***   Pertinent previous records reviewed include ***    Follow Up: ***     Subjective:   I, Crystal Carroll, am serving as a Neurosurgeon for Doctor Richardean Sale   Chief Complaint: right foot pain    HPI:    01/23/2023 Patient is a 78 year old female with right foot pain. Patient states right foot pain for some months. DVT negative, xrays negative. Doesn't remember a MOI, but maybe rolled it sometime ago. Walks with cane per usual. Decreased ROM .    Left knee pain but thinks its from the weather   02/05/2023 Patient states that she has some swelling and bruising. She would like prednisone one more time. Pain and swelling came back on the 6th ay   02/19/2023 Patient states   Relevant Historical Information: Hypertension, osteoporosis, CKD stage III, history of CVA   Additional pertinent review of systems negative.   Current Outpatient Medications:    acetaminophen (TYLENOL) 500 MG tablet, Take 1,000 mg by mouth 2 (two) times daily., Disp: , Rfl:    ascorbic acid (VITAMIN C) 500 MG tablet, Take 500 mg by mouth in the morning., Disp: , Rfl:    aspirin EC 81 MG tablet, Take 81 mg by mouth at bedtime. , Disp: , Rfl:    buPROPion (WELLBUTRIN XL) 150 MG 24 hr tablet, Take 1 tablet (150 mg total) by mouth daily., Disp: 30 tablet, Rfl: 5   busPIRone (BUSPAR) 10 MG tablet, Take 1 tablet (10 mg total) by mouth daily., Disp: 30 tablet, Rfl: 5   Cholecalciferol (VITAMIN D3) 50 MCG (2000 UT) TABS, Take 2,000 Units by mouth in the morning., Disp: , Rfl:    citalopram (CELEXA) 20 MG tablet, TAKE ONE TABLET BY MOUTH ONCE DAILY, Disp: 90 tablet, Rfl: 3   clobetasol ointment (TEMOVATE) 0.05 %, Apply a pea sized amount BID for 2 weeks, then apply 2 x a week for the next month, Disp: 60 g, Rfl: 0    ezetimibe (ZETIA) 10 MG tablet, Take 1 tablet (10 mg total) by mouth daily., Disp: 90 tablet, Rfl: 3   gabapentin (NEURONTIN) 100 MG capsule, Take 1 capsule (100 mg total) by mouth at bedtime., Disp: 30 capsule, Rfl: 0   ketoconazole (NIZORAL) 2 % cream, Apply to affected area 1-2 times daily, Disp: 60 g, Rfl: 1   methylPREDNISolone (MEDROL DOSEPAK) 4 MG TBPK tablet, Take 6 tablets on day 1.  Take 5 tablets on day 2.  Take 4 tablets on day 3.  Take 3 tablets on day 4.  Take 2 tablets on day 5.  Take 1 tablet on day 6., Disp: 21 tablet, Rfl: 0   pantoprazole (PROTONIX) 20 MG tablet, TAKE ONE TABLET BY MOUTH DAILY, Disp: 90 tablet, Rfl: 3   rosuvastatin (CRESTOR) 40 MG tablet, TAKE ONE TABLET BY MOUTH DAILY, Disp: 90 tablet, Rfl: 3   TURMERIC PO, Take 1,500 mg by mouth daily., Disp: , Rfl:    vitamin B-12 (CYANOCOBALAMIN) 1000 MCG tablet, Take 1,000 mcg by mouth in the morning., Disp: , Rfl:    Objective:     There were no vitals filed for this visit.    There is no height or weight on file to calculate BMI.    Physical Exam:    ***  Electronically signed by:  Crystal Carroll [name] 7:32 AM 02/18/23

## 2023-02-19 ENCOUNTER — Ambulatory Visit: Payer: PPO | Admitting: Sports Medicine

## 2023-02-25 NOTE — Progress Notes (Deleted)
 Aleen Sells [name] [address] [place] [address] Phone: [phone]   Assessment and Plan:     There are no diagnoses linked to this encounter.  ***   Pertinent previous records reviewed include ***    Follow Up: ***     Subjective:   I, Crystal Carroll, am serving as a Neurosurgeon for Doctor Richardean Sale   Chief Complaint: right foot pain    HPI:    01/23/2023 Patient is a 78 year old female with right foot pain. Patient states right foot pain for some months. DVT negative, xrays negative. Doesn't remember a MOI, but maybe rolled it sometime ago. Walks with cane per usual. Decreased ROM .    Left knee pain but thinks its from the weather   02/05/2023 Patient states that she has some swelling and bruising. She would like prednisone one more time. Pain and swelling came back on the 6th ay   02/26/2023 Patient states   Relevant Historical Information: Hypertension, osteoporosis, CKD stage III, history of CVA   Additional pertinent review of systems negative.   Current Outpatient Medications:    acetaminophen (TYLENOL) 500 MG tablet, Take 1,000 mg by mouth 2 (two) times daily., Disp: , Rfl:    ascorbic acid (VITAMIN C) 500 MG tablet, Take 500 mg by mouth in the morning., Disp: , Rfl:    aspirin EC 81 MG tablet, Take 81 mg by mouth at bedtime. , Disp: , Rfl:    buPROPion (WELLBUTRIN XL) 150 MG 24 hr tablet, Take 1 tablet (150 mg total) by mouth daily., Disp: 30 tablet, Rfl: 5   busPIRone (BUSPAR) 10 MG tablet, Take 1 tablet (10 mg total) by mouth daily., Disp: 30 tablet, Rfl: 5   Cholecalciferol (VITAMIN D3) 50 MCG (2000 UT) TABS, Take 2,000 Units by mouth in the morning., Disp: , Rfl:    citalopram (CELEXA) 20 MG tablet, TAKE ONE TABLET BY MOUTH ONCE DAILY, Disp: 90 tablet, Rfl: 3   clobetasol ointment (TEMOVATE) 0.05 %, Apply a pea sized amount BID for 2 weeks, then apply 2 x a week for the next month, Disp: 60 g, Rfl: 0    ezetimibe (ZETIA) 10 MG tablet, Take 1 tablet (10 mg total) by mouth daily., Disp: 90 tablet, Rfl: 3   gabapentin (NEURONTIN) 100 MG capsule, Take 1 capsule (100 mg total) by mouth at bedtime., Disp: 30 capsule, Rfl: 0   ketoconazole (NIZORAL) 2 % cream, Apply to affected area 1-2 times daily, Disp: 60 g, Rfl: 1   methylPREDNISolone (MEDROL DOSEPAK) 4 MG TBPK tablet, Take 6 tablets on day 1.  Take 5 tablets on day 2.  Take 4 tablets on day 3.  Take 3 tablets on day 4.  Take 2 tablets on day 5.  Take 1 tablet on day 6., Disp: 21 tablet, Rfl: 0   pantoprazole (PROTONIX) 20 MG tablet, TAKE ONE TABLET BY MOUTH DAILY, Disp: 90 tablet, Rfl: 3   rosuvastatin (CRESTOR) 40 MG tablet, TAKE ONE TABLET BY MOUTH DAILY, Disp: 90 tablet, Rfl: 3   TURMERIC PO, Take 1,500 mg by mouth daily., Disp: , Rfl:    vitamin B-12 (CYANOCOBALAMIN) 1000 MCG tablet, Take 1,000 mcg by mouth in the morning., Disp: , Rfl:    Objective:     There were no vitals filed for this visit.    There is no height or weight on file to calculate BMI.    Physical Exam:    ***  Electronically signed by:  Aleen Sells [name] 7:34 AM 02/25/23

## 2023-02-26 ENCOUNTER — Ambulatory Visit: Payer: PPO | Admitting: Sports Medicine

## 2023-03-04 NOTE — Progress Notes (Unsigned)
 Aleen [facility]ells [name] [address] [place] [address] Phone: [phone]   Assessment and Plan:     There are no diagnoses linked to this encounter.  ***   Pertinent previous records reviewed include ***    Follow Up: ***     Subjective:   I, Helaman Mecca, am serving as a Neurosurgeon for Doctor Richardean Sale   Chief Complaint: right foot pain    HPI:    01/23/2023 Patient is a 78 year old female with right foot pain. Patient states right foot pain for some months. DVT negative, xrays negative. Doesn't remember a MOI, but maybe rolled it sometime ago. Walks with cane per usual. Decreased ROM .    Left knee pain but thinks its from the weather   02/05/2023 Patient states that she has some swelling and bruising. She would like prednisone one more time. Pain and swelling came back on the 6th ay   03/05/2023 Patient states   Relevant Historical Information: Hypertension, osteoporosis, CKD stage III, history of CVA   Additional pertinent review of systems negative.   Current Outpatient Medications:    acetaminophen (TYLENOL) 500 MG tablet, Take 1,000 mg by mouth 2 (two) times daily., Disp: , Rfl:    ascorbic acid (VITAMIN C) 500 MG tablet, Take 500 mg by mouth in the morning., Disp: , Rfl:    aspirin EC 81 MG tablet, Take 81 mg by mouth at bedtime. , Disp: , Rfl:    buPROPion (WELLBUTRIN XL) 150 MG 24 hr tablet, Take 1 tablet (150 mg total) by mouth daily., Disp: 30 tablet, Rfl: 5   busPIRone (BUSPAR) 10 MG tablet, Take 1 tablet (10 mg total) by mouth daily., Disp: 30 tablet, Rfl: 5   Cholecalciferol (VITAMIN D3) 50 MCG (2000 UT) TABS, Take 2,000 Units by mouth in the morning., Disp: , Rfl:    citalopram (CELEXA) 20 MG tablet, TAKE ONE TABLET BY MOUTH ONCE DAILY, Disp: 90 tablet, Rfl: 3   clobetasol ointment (TEMOVATE) 0.05 %, Apply a pea sized amount BID for 2 weeks, then apply 2 x a week for the next month, Disp: 60 g, Rfl: 0    ezetimibe (ZETIA) 10 MG tablet, Take 1 tablet (10 mg total) by mouth daily., Disp: 90 tablet, Rfl: 3   gabapentin (NEURONTIN) 100 MG capsule, Take 1 capsule (100 mg total) by mouth at bedtime., Disp: 30 capsule, Rfl: 0   ketoconazole (NIZORAL) 2 % cream, Apply to affected area 1-2 times daily, Disp: 60 g, Rfl: 1   methylPREDNISolone (MEDROL DOSEPAK) 4 MG TBPK tablet, Take 6 tablets on day 1.  Take 5 tablets on day 2.  Take 4 tablets on day 3.  Take 3 tablets on day 4.  Take 2 tablets on day 5.  Take 1 tablet on day 6., Disp: 21 tablet, Rfl: 0   pantoprazole (PROTONIX) 20 MG tablet, TAKE ONE TABLET BY MOUTH DAILY, Disp: 90 tablet, Rfl: 3   rosuvastatin (CRESTOR) 40 MG tablet, TAKE ONE TABLET BY MOUTH DAILY, Disp: 90 tablet, Rfl: 3   TURMERIC PO, Take 1,500 mg by mouth daily., Disp: , Rfl:    vitamin B-12 (CYANOCOBALAMIN) 1000 MCG tablet, Take 1,000 mcg by mouth in the morning., Disp: , Rfl:    Objective:     There were no vitals filed for this visit.    There is no height or weight on file to calculate BMI.    Physical Exam:    ***  Electronically signed by:  Aleen Sells [name] 7:37 AM 03/04/23

## 2023-03-05 ENCOUNTER — Ambulatory Visit: Payer: PPO | Admitting: Sports Medicine

## 2023-03-05 VITALS — HR 70 | Ht 64.0 in | Wt 149.0 lb

## 2023-03-05 DIAGNOSIS — M19071 Primary osteoarthritis, right ankle and foot: Secondary | ICD-10-CM | POA: Diagnosis not present

## 2023-03-05 DIAGNOSIS — S93401D Sprain of unspecified ligament of right ankle, subsequent encounter: Secondary | ICD-10-CM

## 2023-03-05 NOTE — Patient Instructions (Signed)
 Tylenol [phone] mg 2-3 times a day for pain relief  Voltaren gel over areas of pain  Continue HEP  PT referral  As needed follow up

## 2023-03-30 ENCOUNTER — Other Ambulatory Visit: Payer: Self-pay | Admitting: Family Medicine

## 2023-05-06 DIAGNOSIS — H00021 Hordeolum internum right upper eyelid: Secondary | ICD-10-CM | POA: Diagnosis not present

## 2023-05-28 ENCOUNTER — Encounter: Payer: Self-pay | Admitting: Cardiology

## 2023-05-30 ENCOUNTER — Ambulatory Visit: Admitting: Family Medicine

## 2023-05-31 DIAGNOSIS — M25361 Other instability, right knee: Secondary | ICD-10-CM | POA: Diagnosis not present

## 2023-05-31 DIAGNOSIS — M25561 Pain in right knee: Secondary | ICD-10-CM | POA: Diagnosis not present

## 2023-06-06 ENCOUNTER — Emergency Department (HOSPITAL_COMMUNITY)
Admission: EM | Admit: 2023-06-06 | Discharge: 2023-06-06 | Disposition: A | Discharge status: Home / Self Care | Attending: Emergency Medicine | Admitting: Emergency Medicine

## 2023-06-06 ENCOUNTER — Other Ambulatory Visit: Payer: Self-pay

## 2023-06-06 ENCOUNTER — Emergency Department (HOSPITAL_COMMUNITY)

## 2023-06-06 ENCOUNTER — Encounter (HOSPITAL_COMMUNITY): Payer: Self-pay

## 2023-06-06 DIAGNOSIS — I10 Essential (primary) hypertension: Secondary | ICD-10-CM | POA: Diagnosis not present

## 2023-06-06 DIAGNOSIS — K5792 Diverticulitis of intestine, part unspecified, without perforation or abscess without bleeding: Secondary | ICD-10-CM

## 2023-06-06 DIAGNOSIS — K5732 Diverticulitis of large intestine without perforation or abscess without bleeding: Secondary | ICD-10-CM | POA: Diagnosis not present

## 2023-06-06 DIAGNOSIS — R079 Chest pain, unspecified: Secondary | ICD-10-CM | POA: Diagnosis not present

## 2023-06-06 DIAGNOSIS — Z7982 Long term (current) use of aspirin: Secondary | ICD-10-CM | POA: Diagnosis not present

## 2023-06-06 DIAGNOSIS — R1084 Generalized abdominal pain: Secondary | ICD-10-CM | POA: Diagnosis not present

## 2023-06-06 DIAGNOSIS — Z981 Arthrodesis status: Secondary | ICD-10-CM | POA: Diagnosis not present

## 2023-06-06 DIAGNOSIS — R1013 Epigastric pain: Secondary | ICD-10-CM | POA: Diagnosis not present

## 2023-06-06 DIAGNOSIS — R0789 Other chest pain: Secondary | ICD-10-CM

## 2023-06-06 DIAGNOSIS — N281 Cyst of kidney, acquired: Secondary | ICD-10-CM | POA: Diagnosis not present

## 2023-06-06 DIAGNOSIS — I700 Atherosclerosis of aorta: Secondary | ICD-10-CM | POA: Diagnosis not present

## 2023-06-06 DIAGNOSIS — M1711 Unilateral primary osteoarthritis, right knee: Secondary | ICD-10-CM | POA: Insufficient documentation

## 2023-06-06 DIAGNOSIS — K5750 Diverticulosis of both small and large intestine without perforation or abscess without bleeding: Secondary | ICD-10-CM | POA: Diagnosis not present

## 2023-06-06 LAB — CBC WITH DIFFERENTIAL/PLATELET
Abs Immature Granulocytes: 0.07 10*3/uL (ref 0.00–0.07)
Basophils Absolute: 0 10*3/uL (ref 0.0–0.1)
Basophils Relative: 0 %
Eosinophils Absolute: 0 10*3/uL (ref 0.0–0.5)
Eosinophils Relative: 0 %
HCT: 40.8 % (ref 36.0–46.0)
Hemoglobin: 13.8 g/dL (ref 12.0–15.0)
Immature Granulocytes: 1 %
Lymphocytes Relative: 7 %
Lymphs Abs: 0.7 10*3/uL (ref 0.7–4.0)
MCH: 31.2 pg (ref 26.0–34.0)
MCHC: 33.8 g/dL (ref 30.0–36.0)
MCV: 92.1 fL (ref 80.0–100.0)
Monocytes Absolute: 0.5 10*3/uL (ref 0.1–1.0)
Monocytes Relative: 5 %
Neutro Abs: 8.8 10*3/uL — ABNORMAL HIGH (ref 1.7–7.7)
Neutrophils Relative %: 87 %
Platelets: 197 10*3/uL (ref 150–400)
RBC: 4.43 MIL/uL (ref 3.87–5.11)
RDW: 11.9 % (ref 11.5–15.5)
WBC: 10.1 10*3/uL (ref 4.0–10.5)
nRBC: 0 % (ref 0.0–0.2)

## 2023-06-06 LAB — COMPREHENSIVE METABOLIC PANEL WITH GFR
ALT: 70 U/L — ABNORMAL HIGH (ref 0–44)
AST: 246 U/L — ABNORMAL HIGH (ref 15–41)
Albumin: 3.9 g/dL (ref 3.5–5.0)
Alkaline Phosphatase: 66 U/L (ref 38–126)
Anion gap: 12 (ref 5–15)
BUN: 19 mg/dL (ref 8–23)
CO2: 23 mmol/L (ref 22–32)
Calcium: 9.2 mg/dL (ref 8.9–10.3)
Chloride: 104 mmol/L (ref 98–111)
Creatinine, Ser: 0.92 mg/dL (ref 0.44–1.00)
GFR, Estimated: >60 mL/min (ref >60)
Glucose, Bld: 145 mg/dL — ABNORMAL HIGH (ref 70–99)
Potassium: 3.7 mmol/L (ref 3.5–5.1)
Sodium: 139 mmol/L (ref 135–145)
Total Bilirubin: 1 mg/dL (ref 0.0–1.2)
Total Protein: 6.4 g/dL — ABNORMAL LOW (ref 6.5–8.1)

## 2023-06-06 LAB — TROPONIN I (HIGH SENSITIVITY)
Troponin I (High Sensitivity): 5 ng/L (ref <18)
Troponin I (High Sensitivity): 12 ng/L (ref <18)

## 2023-06-06 LAB — LIPASE, BLOOD: Lipase: 59 U/L — ABNORMAL HIGH (ref 11–51)

## 2023-06-06 MED ORDER — AMOXICILLIN-POT CLAVULANATE 875-125 MG PO TABS: 1.0000 | ORAL_TABLET | Freq: Two times a day (BID) | ORAL | 0 refills | Status: DC

## 2023-06-06 MED ORDER — IOHEXOL 350 MG/ML SOLN
60.0000 mL | Freq: Once | INTRAVENOUS | Status: AC | PRN
  Administered 2023-06-06: 60 mL via INTRAVENOUS

## 2023-06-06 NOTE — ED Provider Notes (Signed)
 Crystal Carroll EMERGENCY DEPARTMENT AT [facility] Provider Note   CSN: [id number] Arrival date & time: 06/06/23  1607     History  Chief Complaint  Patient presents with   Abdominal Pain   Chest Pain     Crystal Carroll is Crystal 78 y.o. female who had an episode of pain that began in the upper abdomen/epigastric area and rose to the center of her chest causing her concern and causing her to call 911.  By the time EMS arrived to her house she stated that she had Crystal complete resolution of pain but came to the hospital for evaluation.  Upon evaluation she has no pain or discomfort, no shortness of breath, denies any nausea or vomiting.  Of note she is currently on prednisone , on the tail end of Crystal 7-day taper with 1 day left, for osteoarthritis in the right knee.  Notable previous diagnoses are hyperlipidemia, generalized anxiety, primary hypertension, previous lacunar infarct.  Is also noted that she lives alone, with her husband having passed away this previous 12/23/23.  She states that this causes her increased stress and anxiety.   Abdominal Pain Associated symptoms: chest pain   Chest Pain Associated symptoms: abdominal pain        Home Medications Prior to Admission medications   Medication Sig Start Date End Date Taking? Authorizing Provider  acetaminophen  (TYLENOL ) 500 MG tablet Take 1,000 mg by mouth 2 (two) times daily.    [provider]  ascorbic acid (VITAMIN C) 500 MG tablet Take 500 mg by mouth in the morning.    [provider]  aspirin EC 81 MG tablet Take 81 mg by mouth at bedtime.     [provider]  buPROPion  (WELLBUTRIN  XL) 150 MG 24 hr tablet Take 1 tablet (150 mg total) by mouth daily. 03/31/23   Almira Jaeger, MD  busPIRone  (BUSPAR ) 10 MG tablet Take 1 tablet (10 mg total) by mouth daily. 12/03/22   Almira Jaeger, MD  Cholecalciferol (VITAMIN D3) 50 MCG (2000 UT) TABS Take 2,000 Units by mouth in the morning.    [provider]  citalopram  (CELEXA ) 20 MG tablet TAKE ONE TABLET BY MOUTH ONCE DAILY 06/10/22   Almira Jaeger, MD  clobetasol  ointment (TEMOVATE ) 0.05 % Apply Crystal pea sized amount BID for 2 weeks, then apply 2 x Crystal week for the next month 09/26/22   Alexander Iba, PA  ezetimibe  (ZETIA ) 10 MG tablet Take 1 tablet (10 mg total) by mouth daily. 06/10/22   Almira Jaeger, MD  gabapentin  (NEURONTIN ) 100 MG capsule Take 1 capsule (100 mg total) by mouth at bedtime. 07/16/22   Ulysees Gander, DO  ketoconazole  (NIZORAL ) 2 % cream Apply to affected area 1-2 times daily 09/26/22   Alexander Iba, PA  methylPREDNISolone  (MEDROL  DOSEPAK) 4 MG TBPK tablet Take 6 tablets on day 1.  Take 5 tablets on day 2.  Take 4 tablets on day 3.  Take 3 tablets on day 4.  Take 2 tablets on day 5.  Take 1 tablet on day 6. 01/23/23   Ulysees Gander, DO  pantoprazole  (PROTONIX ) 20 MG tablet TAKE ONE TABLET BY MOUTH DAILY 06/10/22   Almira Jaeger, MD  rosuvastatin  (CRESTOR ) 40 MG tablet TAKE ONE TABLET BY MOUTH DAILY 06/10/22   Almira Jaeger, MD  TURMERIC PO Take 1,500 mg by mouth daily.    [provider]  vitamin B-12 (CYANOCOBALAMIN ) 1000 MCG tablet Take 1,000 mcg by  mouth in the morning.    [provider]      Allergies    Codeine and Sulfamethoxazole    Review of Systems   Review of Systems  Cardiovascular:  Positive for chest pain.  Gastrointestinal:  Positive for abdominal pain.  All other systems reviewed and are negative.   Physical Exam Updated Vital Signs BP (!) 121/96   Pulse (!) 59   Temp 98.6 F (37 C) (Oral)   Resp 17   Ht 5\' 4"  (1.626 m)   Wt 63.5 kg   LMP  (LMP Unknown)   SpO2 100%   BMI 24.03 kg/m  Physical Exam Vitals and nursing note reviewed.  Constitutional:      General: She is not in acute distress.    Appearance: Normal appearance.  HENT:     Head: Normocephalic and atraumatic.     Mouth/Throat:     Mouth: Mucous membranes are moist.     Pharynx:  Oropharynx is clear.  Eyes:     Extraocular Movements: Extraocular movements intact.     Conjunctiva/sclera: Conjunctivae normal.     Pupils: Pupils are equal, round, and reactive to light.  Cardiovascular:     Rate and Rhythm: Normal rate and regular rhythm.     Pulses: Normal pulses.     Heart sounds: Normal heart sounds. No murmur heard.    No friction rub. No gallop.  Pulmonary:     Effort: Pulmonary effort is normal.     Breath sounds: Normal breath sounds.  Abdominal:     General: Abdomen is flat. Bowel sounds are normal.     Palpations: Abdomen is soft.  Musculoskeletal:        General: Normal range of motion.     Cervical back: Normal range of motion and neck supple.     Right lower leg: No edema.     Left lower leg: No edema.  Skin:    General: Skin is warm and dry.     Capillary Refill: Capillary refill takes less than 2 seconds.  Neurological:     General: No focal deficit present.     Mental Status: She is alert. Mental status is at baseline.  Psychiatric:        Mood and Affect: Mood normal.     ED Results / Procedures / Treatments   Labs (all labs ordered are listed, but only abnormal results are displayed) Labs Reviewed  COMPREHENSIVE METABOLIC PANEL WITH GFR - Abnormal; Notable for the following components:      Result Value   Glucose, Bld 145 (*)    Total Protein 6.4 (*)    AST 246 (*)    ALT 70 (*)    All other components within normal limits  CBC WITH DIFFERENTIAL/PLATELET - Abnormal; Notable for the following components:   Neutro Abs 8.8 (*)    All other components within normal limits  LIPASE, BLOOD - Abnormal; Notable for the following components:   Lipase 59 (*)    All other components within normal limits  TROPONIN I (HIGH SENSITIVITY)  TROPONIN I (HIGH SENSITIVITY)    EKG None  Radiology DG Chest Port 1 View Result Date: 06/06/2023 CLINICAL DATA:  Chest pain. EXAM: PORTABLE CHEST 1 VIEW COMPARISON:  01/06/2019. FINDINGS: The heart  size and mediastinal contours are within normal limits. Aortic atherosclerosis. Both lungs are clear. No pleural effusion or pneumothorax. Cervical fusion hardware again noted. No acute osseous abnormality. IMPRESSION: No acute cardiopulmonary findings. Electronically Signed  By: Mannie Seek M.D.   On: 06/06/2023 18:24    Procedures Procedures    Medications Ordered in ED Medications - No data to display  ED Course/ Medical Decision Making/ Crystal&P Clinical Course as of 06/06/23 2152  Fri Jun 06, 2023  2148 CT w/ mild/early diverticulitis, no perforation or abscess, she is HDS. She is tolerating PO. She has seen [facility] previously. Will cover with abx, have her f/u with GI  [SG]    Clinical Course User Index [SG] Teddi Favors, DO                                 Medical Decision Making Amount and/or Complexity of Data Reviewed Labs: ordered. Radiology: ordered.  Risk Prescription drug management.   Medical Decision Making:   Crystal Carroll is Crystal 78 y.o. female who presented to the ED today with chest discomfort detailed above.     Complete initial physical exam performed, notably the patient  was alert and oriented in no apparent distress.  At upon assessment she has no pain, pulmonary auscultation is unremarkable, cardiac auscultation does not note Crystal normal S1-S2 along with Crystal grade 2 crescendo decrescendo murmur at the right upper sternal border.  Noted previous history of mild aortic stenosis.  Reviewed and confirmed nursing documentation for past medical history, family history, social history.    Initial Assessment:   With the patient's presentation of chest discomfort, most likely diagnosis is ACS. Other diagnoses were considered including (but not limited to) aortic dissection, pulmonary embolus, pneumonia. These are considered less likely due to history of present illness and physical exam findings.     Initial Plan:  Assess serum lipase to assess for pancreatic  etiology. Screening labs including CBC and Metabolic panel to evaluate for infectious or metabolic etiology of disease.  CXR to evaluate for structural/infectious intrathoracic pathology.  EKG and serial troponin to evaluate for cardiac pathology. Objective evaluation as below reviewed   Initial Study Results:   Laboratory  All laboratory results reviewed without evidence of clinically relevant pathology.   Exceptions include: None  EKG EKG was reviewed independently. Rate, rhythm, axis, intervals all examined and without medically relevant abnormality. ST segments without concerns for elevations.    Radiology:  All images reviewed independently. Agree with radiology report at this time.   DG Chest Port 1 View Result Date: 06/06/2023 CLINICAL DATA:  Chest pain. EXAM: PORTABLE CHEST 1 VIEW COMPARISON:  01/06/2019. FINDINGS: The heart size and mediastinal contours are within normal limits. Aortic atherosclerosis. Both lungs are clear. No pleural effusion or pneumothorax. Cervical fusion hardware again noted. No acute osseous abnormality. IMPRESSION: No acute cardiopulmonary findings. Electronically Signed   By: Mannie Seek M.D.   On: 06/06/2023 18:24    Reassessment and Plan:   Following Crystal thorough workup of this patient, initial troponin is 5 with second troponin at 12.  There is noted elevation in AST at 246 with ALT of 70, and lipase is noted at 59.  CBC is unremarkable with white count of 10.1.  Chest x-ray did not demonstrate any acute cardiopulmonary pathology and x-ray is unremarkable as well.  She is remained stable while in the department, with heart rate within normal limits, maintaining Crystal sinus rhythm, and blood pressures have been elevated, maps have remained between 100-105.  She has Crystal previous history of hypertension however noted that she does not take any current medications for  hypertension.  She does not have any vision changes, headache, chest pain, shortness of breath.   Again chest x-ray does not show any acute cardiopulmonary pathology does not show any edema.  CT imaging of the abdomen pelvis did demonstrate Crystal diverticulitis, will begin management with Crystal course of Augmentin  and have patient follow-up with her primary care and also with GI for follow-up.          Final Clinical Impression(s) / ED Diagnoses Final diagnoses:  None    Rx / DC Orders ED Discharge Orders     None         Juanetta Nordmann, PA 06/06/23 2152    Russella Courts A, DO 06/10/23 1548

## 2023-06-06 NOTE — ED Notes (Signed)
 Pt back from CT

## 2023-06-06 NOTE — ED Triage Notes (Signed)
 Pt presents to ED from home with abdominal pain that began 1 hr ago that went into her chest. Pain is completely resolved upon arrival to ED.

## 2023-06-06 NOTE — Discharge Instructions (Signed)
 As we discussed, please follow-up with your primary care to continue to follow your liver enzymes and ensure that they return to normal.  Also please schedule an appointment as we discussed with your gastroenterologist at [facility].  Your CT did show that you have some mild diverticulitis, as a result we have prescribed you a course of Augmentin  that you will take over the next week.  Please take this completely and also relay this to your primary care as they can continue to monitor your condition and your improvement.

## 2023-06-06 NOTE — ED Notes (Signed)
 C[facility] contacted

## 2023-06-06 NOTE — ED Notes (Signed)
 Patient transported to CT

## 2023-06-19 ENCOUNTER — Encounter: Payer: Self-pay | Admitting: Family Medicine

## 2023-06-19 ENCOUNTER — Ambulatory Visit (INDEPENDENT_AMBULATORY_CARE_PROVIDER_SITE_OTHER): Admitting: Family Medicine

## 2023-06-19 VITALS — BP 124/64 | HR 63 | Temp 97.2°F | Ht 64.0 in | Wt 145.4 lb

## 2023-06-19 DIAGNOSIS — Z87891 Personal history of nicotine dependence: Secondary | ICD-10-CM | POA: Diagnosis not present

## 2023-06-19 DIAGNOSIS — R1310 Dysphagia, unspecified: Secondary | ICD-10-CM

## 2023-06-19 DIAGNOSIS — Z8673 Personal history of transient ischemic attack (TIA), and cerebral infarction without residual deficits: Secondary | ICD-10-CM

## 2023-06-19 DIAGNOSIS — R0789 Other chest pain: Secondary | ICD-10-CM

## 2023-06-19 DIAGNOSIS — I350 Nonrheumatic aortic (valve) stenosis: Secondary | ICD-10-CM | POA: Diagnosis not present

## 2023-06-19 DIAGNOSIS — I10 Essential (primary) hypertension: Secondary | ICD-10-CM

## 2023-06-19 DIAGNOSIS — N183 Chronic kidney disease, stage 3 unspecified: Secondary | ICD-10-CM | POA: Diagnosis not present

## 2023-06-19 DIAGNOSIS — I4891 Unspecified atrial fibrillation: Secondary | ICD-10-CM

## 2023-06-19 MED ORDER — PANTOPRAZOLE SODIUM 40 MG PO TBEC
40.0000 mg | DELAYED_RELEASE_TABLET | Freq: Every day | ORAL | 3 refills | Status: DC
Start: 2023-06-19 — End: 2023-10-29

## 2023-06-19 NOTE — Patient Instructions (Addendum)
 [facility] contact Please call to schedule visit and/or procedure IF you do not hear within a week Address: [address] [place], [place], [place] [address] Phone: [phone]   We have placed a referral for you today to cardiology- please call their # if you do not hear within a week (may be listed below or you may see mychart message within a few days with #).  -new or worsening symptoms please seek care immediately in emergency room again  We have placed a referral for you today to lung cancer screening- please call their # if you do not hear within a week (may be listed below or you may see mychart message within a few days with #).    Schedule a lab visit at the check out desk within 2 weeks. Return for future fasting labs meaning nothing but water after midnight please. Ok to take your medications with water. The labs scheduled 12/20/22.     Recommended follow up: Return in about 1 month (around 07/19/2023) for followup or sooner if needed.Schedule b4 you leave.

## 2023-06-19 NOTE — Progress Notes (Signed)
 Phone [phone] In person visit   Subjective:   Crystal Carroll is a 78 y.o. year old very pleasant female patient who presents for/with See problem oriented charting Chief Complaint  Patient presents with   Knee Pain    Pt c/o bilateral knee pain with the right on being the worst.   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 02/24/2019    Priority: High   Hyperparathyroidism, primary (HCC) 07/17/2018    Priority: High   History of lacunar cerebrovascular accident (CVA) 06/18/2018    Priority: High   Mild aortic stenosis 07/19/2019    Priority: Medium    Osteoporosis 03/13/2018    Priority: Medium    DDD (degenerative disc disease), lumbar 03/05/2018    Priority: Medium    Aortic atherosclerosis (HCC) 06/12/2017    Priority: Medium    Palpitations 05/12/2017    Priority: Medium    Vitamin B12 deficiency 05/12/2017    Priority: Medium    Cervical radiculopathy 04/03/2016    Priority: Medium    Essential tremor 04/18/2015    Priority: Medium    CKD (chronic kidney disease), stage III (HCC) 10/15/2013    Priority: Medium    Former smoker 12/24/2006    Priority: Medium    Hyperlipidemia 05/26/2006    Priority: Medium    GAD (generalized anxiety disorder) 05/26/2006    Priority: Medium    Essential hypertension 05/26/2006    Priority: Medium    Patellofemoral arthritis 03/05/2018    Priority: Low   Unstable gait 01/20/2018    Priority: Low   Benign neoplasm of colon 06/14/2014    Priority: Low   H/O cold sores 10/15/2013    Priority: Low   GERD 05/26/2006    Priority: Low    Medications- reviewed and updated Current Outpatient Medications  Medication Sig Dispense Refill   acetaminophen  (TYLENOL ) 500  MG tablet Take 1,000 mg by mouth 2 (two) times daily.     ascorbic acid (VITAMIN C) 500 MG tablet Take 500 mg by mouth in the morning.     aspirin EC 81 MG tablet Take 81 mg by mouth at bedtime.      buPROPion  (WELLBUTRIN  XL) 150 MG 24 hr tablet Take 1 tablet (150 mg total) by mouth daily. 30 tablet 5   busPIRone  (BUSPAR ) 10 MG tablet Take 1 tablet (10 mg total) by mouth daily. 30 tablet 5   Cholecalciferol (VITAMIN D3) 50 MCG (2000 UT) TABS Take 2,000 Units by mouth in the morning.     citalopram  (CELEXA ) 20 MG tablet TAKE ONE TABLET BY MOUTH ONCE DAILY 90 tablet 3   clobetasol  ointment (TEMOVATE ) 0.05 % Apply a pea sized amount BID for 2 weeks, then apply 2 x a week for the next month 60 g 0   ezetimibe  (ZETIA ) 10 MG tablet Take 1 tablet (10 mg total) by mouth daily. 90 tablet 3   gabapentin  (NEURONTIN ) 100 MG capsule Take 1 capsule (100 mg total) by mouth at bedtime. 30 capsule 0   pantoprazole  (PROTONIX ) 40 MG tablet Take 1 tablet (40 mg total) by mouth daily. 30 tablet 3   rosuvastatin  (CRESTOR ) 40 MG tablet TAKE ONE TABLET BY MOUTH DAILY 90 tablet 3   TURMERIC PO Take 1,500  mg by mouth daily.     vitamin B-12 (CYANOCOBALAMIN ) 1000 MCG tablet Take 1,000 mcg by mouth in the morning.     ketoconazole  (NIZORAL ) 2 % cream Apply to affected area 1-2 times daily 60 g 1   No current facility-administered medications for this visit.     Objective:  BP 124/64   Pulse 63   Temp (!) 97.2 F (36.2 C)   Ht 5' 4 (1.626 m)   Wt 145 lb 6.4 oz (66 kg)   LMP  (LMP Unknown)   SpO2 95%   BMI 24.96 kg/m  Gen: NAD, resting comfortably CV: RRR no murmurs rubs or gallops Lungs: CTAB no crackles, wheeze, rhonchi Abdomen: soft/nontender/nondistended/normal bowel sounds. No rebound or guarding.  Ext: no edema Skin: warm, dry Neuro: Walks with cane as per baseline-stability appears similar to prior evaluations   EKG: Sinus rhythm with rate 64, normal axis, normal intervals, possible left atrial  enlargement but otherwise no hypertrophy, no significant st or t wave changes-possible very mild depression in I-III as well as V3    Assessment and Plan   # Social update- lost husband Rodman Clam since last visit- late December  # Emergency department follow-up for diverticulitis S: Emergency department visit 06/06/2023 and diagnosed with uncomplicated diverticulitis-she was given Augmentin  and recommended to follow-up with GI.  She has tried to do a soft diet and has not progressed much so far.  She has done some reading on the Internet and is concerned about things like strawberries and nuts  She actually presented with upper abdominal pain/epigastric pain that rose to the center of her chest but thankfully this resolved before getting into the hospital.  She was finishing up a course of prednisone  for her right knee pain from [facility]. Troponin trend was flat thankfully.  She actually reports 3 episodes of chest pain in the last 10 days or so-central chest pain burning but no shortness of breath. No lightheadedness- no left arm or neck pain or jaw pain.  Getting up and walking helps. No palpitations. First episode was severe when going to the hospital by EMS.  She is already established with cardiology- last saw Dr. Alda Amas in 2022 and Friddie Jetty 07/02/22 to follow up on aortic stenosis but interestingly enough echo showed no aortic stenosis on 07/30/22 The aortic valve is normal in structure. There is mild calcification of the aortic valve. Aortic valve regurgitation is not visualized. No aortic stenosis is present. Aortic valve mean gradient measures 10 mmHg. Aortic valve Vmax measures 2. 04 m/ s.  Patient has also had worsening swallowing issues since going down on pantoprazole  to 20 mg  She did have a finding of atrial fibrillation on EKG-I see no correction of this and patient has no history of A-fib and patient has a history of lacunar stroke on MRI of the brain for which she had been on  aspirin in the past   A/P: Diverticulitis appears resolved-discussed graduating diet and also discussed that she does not have to limit seeds/nuts etc.  For her atypical chest pain I wonder if this could be reflux related especially in light of her intermittent dysphagia issues-I am going to increase her pantoprazole  back to 40 mg and I have referred her to GI to discuss possible endoscopy-at the same time I want her to discuss possible colonoscopy though I do not see that she meets criterion for repeating colonoscopy especially with reassuring exam February 2024 and plan at that time for no further repeat  For her atypical chest pain I also want her to see cardiology for their opinion especially with the question of atrial fibrillation or flutter which would be new onset and was read as atrial fibrillation by ED physician as well as EKG-repeat EKG today without A-fib but would need to be you ED EKGs.  Heart rate has been controlled and appears to be in sinus rhythm today and already on aspirin so I am not going to make any changes at this time  She will also come back for repeat labs-had temporary bump in AST ALT but no obvious worrisome findings on CT abdomen pelvis so we need to make sure that things are trending back down  We also discussed incidental compression fracture on CT abdomen pelvis L5-she denies back pain at this time    # Bilateral knee pain S: Patient reports bilateral knee pain right greater than left. Worse with weather changes -She has tried tylenol   -has seen [facility] and was told arthritis- given a brace for the right leg.  - brace has helped A/P: bilateral knee pain worse on right from arthritis- brace is helping- she wants to continue  #Hyperlipidemia/aortic atherosclerosis/ history of stroke- on aspirin 81 mg with LDL goal under 70 S: Compliant with rosuvastatin  40 mg and Zetia  10 mg.   -Aortic atherosclerosis noted CT 06/26/2017 for lung cancer screening.    Lab Results  Component Value Date   CHOL 146 08/23/2021   HDL 65.40 08/23/2021   LDLCALC 53 08/23/2021   LDLDIRECT 106 (H) 07/19/2016   TRIG 142.0 08/23/2021   CHOLHDL 2 08/23/2021  A/P: Lipids have been well-controlled but she is certainly due for repeat-will come back for fasting lipid panel along with other labs ordered in December that she had not previously come back for  #Hypertension/CKD stage III S: Patient remains off lisinopril  fortunately blood pressure remains controlled  -GFR has been stable largely in the 40s lately- in the past had been in the 30s A/P:  Most recent GFR was actually over 60!  Has shown further improvement it appears-may not in fact have CKD stage III.  Blood pressure is well-controlled without medicine-continue to monitor   #Unstable gait-long-term history of this and reports ongoing issues-prior MRI of the brain to rule out cerebellar stroke which was negative but had prior lacunar stroke and patient was placed on aspirin.  She also has some cervical spine spinal stenosis which could contribute-has seen Dr. Festus Hubert in the past    #Anxiety/stress management S: Compliant with Celexa  20 mg daily , Wellbutrin  150mg  XR (reduced from 300mg  due to anxiety concern). Buspirone  7.5 mg BID has been helpful thankfully -Prior on xanax  but had started to feel oversedated.  S: Compliant with Celexa  20 mg, Wellbutrin  150 mg extended release(prior 300 but reduced in past to help with anxiety), buspirone   - in past sparing Xanax  as needed for anxiety    06/19/2023    3:14 PM 03/11/2022    1:49 PM 11/19/2021    3:01 PM  Depression screen PHQ 2/9  Decreased Interest 0 0 0  Down, Depressed, Hopeless 0 0 0  PHQ - 2 Score 0 0 0  Altered sleeping 0    Tired, decreased energy 0    Change in appetite 0    Feeling bad or failure about yourself  0    Trouble concentrating 0    Moving slowly or fidgety/restless 0    Suicidal thoughts 0    PHQ-9 Score 0  Difficult doing  work/chores Not difficult at all    A/P: No depressed symptoms-anxiety reasonably well-controlled other than recent health issues which we are trying to address by working up appropriately  Recommended follow up: Return in about 1 month (around 07/19/2023) for followup or sooner if needed.Schedule b4 you leave. Future Appointments  Date Time Provider Department Center  07/24/2023  3:00 PM Almira Jaeger, MD [facility]    Lab/Order associations:   ICD-10-CM   1. Dysphagia, unspecified type  R13.10 Ambulatory referral to Gastroenterology    2. Atypical chest pain  R07.89 Ambulatory referral to Cardiology    3. New onset atrial fibrillation [facility])  I48.91 Ambulatory referral to Cardiology    EKG 12-Lead    4. Former smoker  Z87.891 Ambulatory Referral for Lung Cancer Scre    5. Stage 3 chronic kidney disease, unspecified whether stage 3a or 3b CKD (HCC) Chronic N18.30     6. History of lacunar cerebrovascular accident (CVA)  Z86.73     7. Essential hypertension  I10     8. Mild aortic stenosis  I35.0       Meds ordered this encounter  Medications   pantoprazole  (PROTONIX ) 40 MG tablet    Sig: Take 1 tablet (40 mg total) by mouth daily.    Dispense:  30 tablet    Refill:  3    Return precautions advised.  Clarisa Crooked, MD

## 2023-06-20 ENCOUNTER — Encounter: Payer: Self-pay | Admitting: Family Medicine

## 2023-06-23 DIAGNOSIS — M25562 Pain in left knee: Secondary | ICD-10-CM | POA: Diagnosis not present

## 2023-06-23 DIAGNOSIS — M25561 Pain in right knee: Secondary | ICD-10-CM | POA: Diagnosis not present

## 2023-06-26 ENCOUNTER — Inpatient Hospital Stay (HOSPITAL_COMMUNITY)

## 2023-06-26 ENCOUNTER — Emergency Department (HOSPITAL_COMMUNITY)

## 2023-06-26 ENCOUNTER — Inpatient Hospital Stay (HOSPITAL_COMMUNITY)
Admission: EM | Admit: 2023-06-26 | Discharge: 2023-06-28 | DRG: 440 | Disposition: A | Discharge status: Home / Self Care | Attending: Internal Medicine | Admitting: Internal Medicine

## 2023-06-26 ENCOUNTER — Other Ambulatory Visit: Payer: Self-pay

## 2023-06-26 DIAGNOSIS — Z8249 Family history of ischemic heart disease and other diseases of the circulatory system: Secondary | ICD-10-CM | POA: Diagnosis not present

## 2023-06-26 DIAGNOSIS — K5750 Diverticulosis of both small and large intestine without perforation or abscess without bleeding: Secondary | ICD-10-CM | POA: Diagnosis not present

## 2023-06-26 DIAGNOSIS — I129 Hypertensive chronic kidney disease with stage 1 through stage 4 chronic kidney disease, or unspecified chronic kidney disease: Secondary | ICD-10-CM | POA: Diagnosis present

## 2023-06-26 DIAGNOSIS — R112 Nausea with vomiting, unspecified: Principal | ICD-10-CM

## 2023-06-26 DIAGNOSIS — N183 Chronic kidney disease, stage 3 unspecified: Secondary | ICD-10-CM | POA: Diagnosis not present

## 2023-06-26 DIAGNOSIS — K219 Gastro-esophageal reflux disease without esophagitis: Secondary | ICD-10-CM | POA: Diagnosis present

## 2023-06-26 DIAGNOSIS — I350 Nonrheumatic aortic (valve) stenosis: Secondary | ICD-10-CM | POA: Diagnosis not present

## 2023-06-26 DIAGNOSIS — M1711 Unilateral primary osteoarthritis, right knee: Secondary | ICD-10-CM | POA: Diagnosis present

## 2023-06-26 DIAGNOSIS — Z79899 Other long term (current) drug therapy: Secondary | ICD-10-CM | POA: Diagnosis not present

## 2023-06-26 DIAGNOSIS — I959 Hypotension, unspecified: Secondary | ICD-10-CM | POA: Diagnosis not present

## 2023-06-26 DIAGNOSIS — K851 Biliary acute pancreatitis without necrosis or infection: Secondary | ICD-10-CM

## 2023-06-26 DIAGNOSIS — R0902 Hypoxemia: Secondary | ICD-10-CM | POA: Diagnosis not present

## 2023-06-26 DIAGNOSIS — R7989 Other specified abnormal findings of blood chemistry: Secondary | ICD-10-CM | POA: Diagnosis not present

## 2023-06-26 DIAGNOSIS — R110 Nausea: Secondary | ICD-10-CM | POA: Diagnosis not present

## 2023-06-26 DIAGNOSIS — R1084 Generalized abdominal pain: Secondary | ICD-10-CM

## 2023-06-26 DIAGNOSIS — E876 Hypokalemia: Secondary | ICD-10-CM | POA: Diagnosis present

## 2023-06-26 DIAGNOSIS — Z7982 Long term (current) use of aspirin: Secondary | ICD-10-CM | POA: Diagnosis not present

## 2023-06-26 DIAGNOSIS — Z860101 Personal history of adenomatous and serrated colon polyps: Secondary | ICD-10-CM | POA: Diagnosis not present

## 2023-06-26 DIAGNOSIS — F419 Anxiety disorder, unspecified: Secondary | ICD-10-CM | POA: Diagnosis not present

## 2023-06-26 DIAGNOSIS — I4891 Unspecified atrial fibrillation: Secondary | ICD-10-CM | POA: Diagnosis not present

## 2023-06-26 DIAGNOSIS — K852 Alcohol induced acute pancreatitis without necrosis or infection: Secondary | ICD-10-CM | POA: Diagnosis not present

## 2023-06-26 DIAGNOSIS — R0789 Other chest pain: Secondary | ICD-10-CM | POA: Diagnosis not present

## 2023-06-26 DIAGNOSIS — F1090 Alcohol use, unspecified, uncomplicated: Secondary | ICD-10-CM

## 2023-06-26 DIAGNOSIS — Z87891 Personal history of nicotine dependence: Secondary | ICD-10-CM | POA: Diagnosis not present

## 2023-06-26 DIAGNOSIS — Z602 Problems related to living alone: Secondary | ICD-10-CM | POA: Diagnosis not present

## 2023-06-26 DIAGNOSIS — I700 Atherosclerosis of aorta: Secondary | ICD-10-CM | POA: Diagnosis not present

## 2023-06-26 DIAGNOSIS — E785 Hyperlipidemia, unspecified: Secondary | ICD-10-CM | POA: Diagnosis present

## 2023-06-26 DIAGNOSIS — R1310 Dysphagia, unspecified: Secondary | ICD-10-CM | POA: Diagnosis present

## 2023-06-26 DIAGNOSIS — F32A Depression, unspecified: Secondary | ICD-10-CM | POA: Diagnosis not present

## 2023-06-26 DIAGNOSIS — Z8673 Personal history of transient ischemic attack (TIA), and cerebral infarction without residual deficits: Secondary | ICD-10-CM

## 2023-06-26 DIAGNOSIS — K5730 Diverticulosis of large intestine without perforation or abscess without bleeding: Secondary | ICD-10-CM | POA: Diagnosis present

## 2023-06-26 DIAGNOSIS — I10 Essential (primary) hypertension: Secondary | ICD-10-CM | POA: Diagnosis not present

## 2023-06-26 DIAGNOSIS — K859 Acute pancreatitis without necrosis or infection, unspecified: Secondary | ICD-10-CM | POA: Diagnosis present

## 2023-06-26 DIAGNOSIS — K760 Fatty (change of) liver, not elsewhere classified: Secondary | ICD-10-CM | POA: Diagnosis not present

## 2023-06-26 DIAGNOSIS — E039 Hypothyroidism, unspecified: Secondary | ICD-10-CM | POA: Diagnosis not present

## 2023-06-26 LAB — URINALYSIS, ROUTINE W REFLEX MICROSCOPIC
Bacteria, UA: NONE SEEN
Bilirubin Urine: NEGATIVE
Glucose, UA: NEGATIVE mg/dL
Ketones, ur: NEGATIVE mg/dL
Leukocytes,Ua: NEGATIVE
Nitrite: NEGATIVE
Protein, ur: 30 mg/dL — AB
Specific Gravity, Urine: 1.029 (ref 1.005–1.030)
pH: 5 (ref 5.0–8.0)

## 2023-06-26 LAB — MAGNESIUM: Magnesium: 1.8 mg/dL (ref 1.7–2.4)

## 2023-06-26 LAB — COMPREHENSIVE METABOLIC PANEL WITH GFR
ALT: 235 U/L — ABNORMAL HIGH (ref 0–44)
AST: 640 U/L — ABNORMAL HIGH (ref 15–41)
Albumin: 4.1 g/dL (ref 3.5–5.0)
Alkaline Phosphatase: 131 U/L — ABNORMAL HIGH (ref 38–126)
Anion gap: 13 (ref 5–15)
BUN: 14 mg/dL (ref 8–23)
CO2: 23 mmol/L (ref 22–32)
Calcium: 9.7 mg/dL (ref 8.9–10.3)
Chloride: 106 mmol/L (ref 98–111)
Creatinine, Ser: 1.02 mg/dL — ABNORMAL HIGH (ref 0.44–1.00)
GFR, Estimated: 56 mL/min — ABNORMAL LOW (ref >60)
Glucose, Bld: 131 mg/dL — ABNORMAL HIGH (ref 70–99)
Potassium: 2.9 mmol/L — ABNORMAL LOW (ref 3.5–5.1)
Sodium: 142 mmol/L (ref 135–145)
Total Bilirubin: 1.3 mg/dL — ABNORMAL HIGH (ref 0.0–1.2)
Total Protein: 6.8 g/dL (ref 6.5–8.1)

## 2023-06-26 LAB — TRIGLYCERIDES: Triglycerides: 248 mg/dL — ABNORMAL HIGH (ref <150)

## 2023-06-26 LAB — CBC WITH DIFFERENTIAL/PLATELET
Abs Immature Granulocytes: 0.04 10*3/uL (ref 0.00–0.07)
Basophils Absolute: 0 10*3/uL (ref 0.0–0.1)
Basophils Relative: 0 %
Eosinophils Absolute: 0 10*3/uL (ref 0.0–0.5)
Eosinophils Relative: 0 %
HCT: 41.5 % (ref 36.0–46.0)
Hemoglobin: 13.8 g/dL (ref 12.0–15.0)
Immature Granulocytes: 1 %
Lymphocytes Relative: 3 %
Lymphs Abs: 0.2 10*3/uL — ABNORMAL LOW (ref 0.7–4.0)
MCH: 30.5 pg (ref 26.0–34.0)
MCHC: 33.3 g/dL (ref 30.0–36.0)
MCV: 91.6 fL (ref 80.0–100.0)
Monocytes Absolute: 0 10*3/uL — ABNORMAL LOW (ref 0.1–1.0)
Monocytes Relative: 1 %
Neutro Abs: 7.3 10*3/uL (ref 1.7–7.7)
Neutrophils Relative %: 95 %
Platelets: 183 10*3/uL (ref 150–400)
RBC: 4.53 MIL/uL (ref 3.87–5.11)
RDW: 12.6 % (ref 11.5–15.5)
WBC: 7.6 10*3/uL (ref 4.0–10.5)
nRBC: 0 % (ref 0.0–0.2)

## 2023-06-26 LAB — HEPATITIS PANEL, ACUTE
HCV Ab: NONREACTIVE
Hep A IgM: NONREACTIVE
Hep B C IgM: NONREACTIVE
Hepatitis B Surface Ag: NONREACTIVE

## 2023-06-26 LAB — PROTIME-INR
INR: 1.1 (ref 0.8–1.2)
Prothrombin Time: 14.4 s (ref 11.4–15.2)

## 2023-06-26 LAB — LIPASE, BLOOD: Lipase: >2800 U/L — ABNORMAL HIGH (ref 11–51)

## 2023-06-26 LAB — ACETAMINOPHEN LEVEL: Acetaminophen (Tylenol), Serum: <10 ug/mL — ABNORMAL LOW (ref 10–30)

## 2023-06-26 MED ORDER — IBUPROFEN 200 MG PO TABS: 400.0000 mg | ORAL_TABLET | Freq: Four times a day (QID) | ORAL | Status: DC | PRN

## 2023-06-26 MED ORDER — SODIUM CHLORIDE 0.9 % IV SOLN: INTRAVENOUS | Status: DC

## 2023-06-26 MED ORDER — ONDANSETRON HCL 4 MG/2ML IJ SOLN: 4.0000 mg | Freq: Four times a day (QID) | INTRAMUSCULAR | Status: DC | PRN

## 2023-06-26 MED ORDER — HYDROMORPHONE HCL 1 MG/ML IJ SOLN
0.5000 mg | Freq: Once | INTRAMUSCULAR | Status: DC
  Filled 2023-06-26: qty 1

## 2023-06-26 MED ORDER — SODIUM CHLORIDE 0.9 % IV SOLN
12.5000 mg | Freq: Once | INTRAVENOUS | Status: AC
  Administered 2023-06-26: 12.5 mg via INTRAVENOUS
  Filled 2023-06-26: qty 12.5

## 2023-06-26 MED ORDER — ENOXAPARIN SODIUM 40 MG/0.4ML IJ SOSY
40.0000 mg | PREFILLED_SYRINGE | INTRAMUSCULAR | Status: DC
  Administered 2023-06-26 – 2023-06-28 (×2): 40 mg via SUBCUTANEOUS
  Filled 2023-06-26 (×2): qty 0.4

## 2023-06-26 MED ORDER — SODIUM CHLORIDE 0.9 % IV BOLUS
1000.0000 mL | Freq: Once | INTRAVENOUS | Status: AC
  Administered 2023-06-26: 1000 mL via INTRAVENOUS

## 2023-06-26 MED ORDER — POTASSIUM CHLORIDE 10 MEQ/100ML IV SOLN
10.0000 meq | INTRAVENOUS | Status: AC
  Administered 2023-06-26 (×2): 10 meq via INTRAVENOUS
  Filled 2023-06-26: qty 100

## 2023-06-26 MED ORDER — ALBUTEROL SULFATE (2.5 MG/3ML) 0.083% IN NEBU
2.5000 mg | INHALATION_SOLUTION | RESPIRATORY_TRACT | Status: DC | PRN
Start: 2023-06-26 — End: 2023-06-28

## 2023-06-26 MED ORDER — ONDANSETRON HCL 4 MG/2ML IJ SOLN
4.0000 mg | Freq: Once | INTRAMUSCULAR | Status: AC
  Administered 2023-06-26: 4 mg via INTRAVENOUS
  Filled 2023-06-26: qty 2

## 2023-06-26 MED ORDER — IOHEXOL 300 MG/ML  SOLN
100.0000 mL | Freq: Once | INTRAMUSCULAR | Status: AC | PRN
  Administered 2023-06-26: 100 mL via INTRAVENOUS

## 2023-06-26 MED ORDER — MORPHINE SULFATE (PF) 2 MG/ML IV SOLN: 1.0000 mg | INTRAVENOUS | Status: DC | PRN

## 2023-06-26 MED ORDER — TRAZODONE HCL 50 MG PO TABS: 25.0000 mg | ORAL_TABLET | Freq: Every evening | ORAL | Status: DC | PRN

## 2023-06-26 NOTE — ED Notes (Signed)
Off to ct

## 2023-06-26 NOTE — ED Triage Notes (Signed)
 Pt presents to ED from home via EMS for 6 emesis episodes starting at 0400. EMS administered 4 mg IVP Zofran  and 100 mL NS bolus. Emesis has subsided with Zofran 

## 2023-06-26 NOTE — Consult Note (Addendum)
 Referring Provider: Dr. Nicklas Barns  Primary Care Physician:  Almira Jaeger, MD Primary Gastroenterologist:  Dr. Darol Elizabeth   Reason for Consultation:  Acute pancreatitis, elevated LFTs  HPI: Crystal Carroll is a 78 y.o. female with a past medical history of anxiety, arthritis, hyperlipidemia, aortic stenosis, TIA, hypothyroidism, CKD, GERD and colon polyps.   She developed chest pain which radiated to the epigastrium and lower abdomen. She called 911 and was transported to the ED for further evaluation. Labs showed a WBC count of 10.1. Hemoglobin 13.8. Total bili 1.0. Alk phos 66. AST 246. ALT 70. Lipase 59. Troponin 5 and 12.  Normal chest x-ray.  EKG without acute ischemia. CTAP showed mild acute sigmoid diverticulitis without evidence of perforation or abscess. The liver, gallbladder and pancreas were normal. She was discharged home on Augmentin  875 mg 1 tab p.o. twice daily x 7 days for suspected early diverticulitis.   She awakened this morning with abrupt onset nausea, vomiting and epigastric pain which radiates through the mid central and lower abdomen. She presented to the ED for  further evaluation. Labs in the ED showed a WBC of 7.6. Hg 13.8. PLT 183. K+ 2.9. BUN 14. Cr 1.02. T. Bili 1.3. Alk phos 131. AST 640 and ALT 235 (AST 246 and ALT 70 two weeks ago). Lipase > 2,8000. Calcium  9.7. Triglyceride level pending.  Acute hepatitis panel pending. Acetaminophen  level pending. CTAP showed mild peripancreatic stranding involving the pancreatic head with small volume of free fluid consistent with acute pancreatitis without peripancreatic fluid collection, however, the peripancreatic free fluid surrounds the proximal duodenum and extends along the right hemiabdomen, less likely duodenitis or perforated duodenal ulcer, normal liver and gallbladder without intra/extrahepatic biliary ductal dilatation.Sigmoid diverticulosis without diverticulitis was also noted. A GI consult was  requested for further evaluation regarding acute pancreatitis and elevated LFTs.  She awakened at 3:45 am this morning with N/V and burning epigastric pain which radiated to the central mid and lower abdomen. Vomited x 6 episodes at home, had dry heaves in the ED. Emesis consisted of partially digested food, ate chicken and mac & cheese for dinner prior evening. No hematemesis. Her bowel movements have been normal, last BM was yesterday. No bloody or black stools. No diarrhea. She completed the 7-day course of Augmentin  as prescribed by the ED for suspected diverticulitis and her abdominal pain abated and she felt relatively well until she awakened this morning with N/V and abdominal pain as noted above. No prior history of pancreatitis. No known history of gallstones. She drinks 2 to 4 ounces of wine mixed in Sprite every evening consistently since her husband passed away 30-Dec-2022.  No known family history of pancreatic disease.  History of GERD on Pantoprazole  40 mg p.o. daily at home.  ECHO 07/30/2022: IMPRESSIONS Left ventricular ejection fraction, by estimation, is 60 to 65%. The left ventricle has normal function. The left ventricle has no regional wall motion abnormalities. Left ventricular diastolic parameters were normal. 1. 2. Right ventricular systolic function is normal. The right ventricular size is normal. The mitral valve is normal in structure. No evidence of mitral valve regurgitation. No evidence of mitral stenosis. 3. The aortic valve is normal in structure. There is mild calcification of the aortic valve. Aortic valve regurgitation is not visualized. No aortic stenosis is present. Aortic valve mean gradient measures 10 mmHg. Aortic valve Vmax measures 2.04 m/s. 4. The inferior vena cava is normal in size with greater than 50% respiratory variability, suggesting  right atrial pressure of 3 mmHg.  GI PROCEDURES:  Colonoscopy 02/14/2022: - Two 3 mm polyps in the cecum, removed with a cold  snare. Resected and retrieved.  - Three 2 to 7 mm polyps in the transverse colon, removed with a cold snare. Resected and retrieved.  - One 4 mm polyp in the sigmoid colon, removed with a cold snare. Resected and retrieved.  - Severe diverticulosis in the sigmoid colon and in the descending colon. There was narrowing of the colon in association with the diverticular opening.  - Non-bleeding internal hemorrhoids. This is the source of the patient's hematochezia. A. COLON, CECAL/TRANSVERSE, POLYPECTOMY:  - TUBULAR ADENOMA(S) (MULTIPLE FRAGMENTS)  - NEGATIVE FOR HIGH-GRADE DYSPLASIA OR MALIGNANCY   B. COLON, SIGMOID, POLYPECTOMY:  - HYPERPLASTIC POLYP  - NEGATIVE FOR MALIGNANCY   Colonoscopy 05/31/2014: 1. Sessile polyp was found in the proximal transverse colon; polypectomy was performed with a cold snare  2. There was severe diverticulosis noted in the sigmoid colon - TUBULAR ADENOMA, 1 FRAGMENT. - BENIGN COLORECTAL MUCOSA, 1 FRAGMENT. - NO HIGH GRADE DYSPLASIA OR MALIGNANCY IDENTIFIED.  EGD 03/16/2007: Gastritis, gastric erosion STOMACH, BIOPSIES:   - FRAGMENTS OF GASTRIC ANTRAL TYPE MUCOSA WITH REACTIVE   EPITHELIAL CHANGES AND   INFLAMMATION (REACTIVE GASTROPATHY).   - ONE FRAGMENT OF BENIGN GASTRIC BODY TYPE MUCOSA WITH NO   EVIDENCE OF ACTIVE   MUCOSAL INFLAMMATION.    Past Medical History:  Diagnosis Date   Anxiety    Aortic atherosclerosis (HCC)    Aortic valvar stenosis 11/2018   Mild, noted on ECHO   Arthritis    cervical neck   CKD (chronic kidney disease), stage III (HCC)    Stable   GERD (gastroesophageal reflux disease)    History of TIA (transient ischemic attack)    per patient, didn't know about it   Hyperlipidemia    Hyperparathyroidism (HCC)    Hypertension    History of    Junctional rhythm    Osteoporosis    Palpitations    Thyroid  nodule    2.0 cm solid nodule in the inferior left thyroid  lobe     Past Surgical History:  Procedure Laterality  Date   CERVICAL LAMINECTOMY  2004   [place]   COLONOSCOPY  05/31/2014   [place]   COLONOSCOPY WITH PROPOFOL  N/A 02/14/2022   Procedure: COLONOSCOPY WITH PROPOFOL ;  Surgeon: Elois Hair, MD;  Location: [facility] ENDOSCOPY;  Service: Gastroenterology;  Laterality: N/A;   ESOPHAGOGASTRODUODENOSCOPY     PARATHYROIDECTOMY Left 01/13/2019   Procedure: LEFT PARATHYROIDECTOMY;  Surgeon: Oralee Billow, MD;  Location: [facility] ORS;  Service: General;  Laterality: Left;   POLYPECTOMY  02/14/2022   Procedure: POLYPECTOMY;  Surgeon: Elois Hair, MD;  Location[facility] ENDOSCOPY;  Service: Gastroenterology;;   THYROID  LOBECTOMY Left 01/13/2019   Procedure: LEFT THYROID  LOBECTOMY;  Surgeon: Oralee Billow, MD;  Location: [facility] ORS;  Service: General;  Laterality: Left;    Prior to Admission medications   Medication Sig Start Date End Date Taking? Authorizing Provider  acetaminophen  (TYLENOL ) 500 MG tablet Take 1,000 mg by mouth 2 (two) times daily.    [provider]  ascorbic acid (VITAMIN C) 500 MG tablet Take 500 mg by mouth in the morning.    [provider]  aspirin EC 81 MG tablet Take 81 mg by mouth at bedtime.     [provider]  buPROPion  (WELLBUTRIN  XL) 150 MG 24 hr tablet Take 1 tablet (150 mg total) by mouth  daily. 03/31/23   Almira Jaeger, MD  busPIRone  (BUSPAR ) 10 MG tablet Take 1 tablet (10 mg total) by mouth daily. 12/03/22   Almira Jaeger, MD  Cholecalciferol (VITAMIN D3) 50 MCG (2000 UT) TABS Take 2,000 Units by mouth in the morning.    [provider]  citalopram  (CELEXA ) 20 MG tablet TAKE ONE TABLET BY MOUTH ONCE DAILY 06/10/22   Almira Jaeger, MD  clobetasol  ointment (TEMOVATE ) 0.05 % Apply a pea sized amount BID for 2 weeks, then apply 2 x a week for the next month 09/26/22   Alexander Iba, PA  ezetimibe  (ZETIA ) 10 MG tablet Take 1 tablet (10 mg total) by mouth daily. 06/10/22   Almira Jaeger, MD  gabapentin  (NEURONTIN ) 100 MG capsule Take 1  capsule (100 mg total) by mouth at bedtime. 07/16/22   Ulysees Gander, DO  ketoconazole  (NIZORAL ) 2 % cream Apply to affected area 1-2 times daily 09/26/22   Alexander Iba, PA  pantoprazole  (PROTONIX ) 40 MG tablet Take 1 tablet (40 mg total) by mouth daily. 06/19/23   Almira Jaeger, MD  rosuvastatin  (CRESTOR ) 40 MG tablet TAKE ONE TABLET BY MOUTH DAILY 06/10/22   Almira Jaeger, MD  TURMERIC PO Take 1,500 mg by mouth daily.    [provider]  vitamin B-12 (CYANOCOBALAMIN ) 1000 MCG tablet Take 1,000 mcg by mouth in the morning.    [provider]    Current Facility-Administered Medications  Medication Dose Route Frequency Provider Last Rate Last Admin   0.9 %  sodium chloride  infusion   Intravenous Continuous Jannette Mend, Mir M, MD       albuterol (PROVENTIL) (2.5 MG/3ML) 0.083% nebulizer solution 2.5 mg  2.5 mg Nebulization Q2H PRN Jannette Mend, Mir M, MD       enoxaparin (LOVENOX) injection 40 mg  40 mg Subcutaneous Q24H Jannette Mend, Mir M, MD       ibuprofen (ADVIL) tablet 400 mg  400 mg Oral Q6H PRN Jannette Mend, Mir M, MD       morphine (PF) 2 MG/ML injection 1 mg  1 mg Intravenous Q3H PRN Jannette Mend, Mir M, MD       ondansetron  (ZOFRAN ) injection 4 mg  4 mg Intravenous Q6H PRN Jannette Mend, Mir M, MD       potassium chloride 10 mEq in 100 mL IVPB  10 mEq Intravenous Q1 Hr x 2 Zackowski, Scott, MD 100 mL/hr at 06/26/23 0914 10 mEq at 06/26/23 0914   promethazine (PHENERGAN) 12.5 mg in sodium chloride  0.9 % 50 mL IVPB  12.5 mg Intravenous Once Zackowski, Scott, MD       traZODone (DESYREL) tablet 25 mg  25 mg Oral QHS PRN Jannette Mend, Mir M, MD       Current Outpatient Medications  Medication Sig Dispense Refill   acetaminophen  (TYLENOL ) 500 MG tablet Take 1,000 mg by mouth 2 (two) times daily.     ascorbic acid (VITAMIN C) 500 MG tablet Take 500 mg by mouth in the morning.     aspirin EC 81 MG tablet Take 81 mg by mouth at bedtime.      buPROPion  (WELLBUTRIN  XL) 150 MG  24 hr tablet Take 1 tablet (150 mg total) by mouth daily. 30 tablet 5   busPIRone  (BUSPAR ) 10 MG tablet Take 1 tablet (10 mg total) by mouth daily. 30 tablet 5   Cholecalciferol (VITAMIN D3) 50 MCG (2000 UT) TABS Take 2,000 Units by mouth in the morning.     citalopram  (CELEXA ) 20  MG tablet TAKE ONE TABLET BY MOUTH ONCE DAILY 90 tablet 3   clobetasol  ointment (TEMOVATE ) 0.05 % Apply a pea sized amount BID for 2 weeks, then apply 2 x a week for the next month 60 g 0   ezetimibe  (ZETIA ) 10 MG tablet Take 1 tablet (10 mg total) by mouth daily. 90 tablet 3   gabapentin  (NEURONTIN ) 100 MG capsule Take 1 capsule (100 mg total) by mouth at bedtime. 30 capsule 0   ketoconazole  (NIZORAL ) 2 % cream Apply to affected area 1-2 times daily 60 g 1   pantoprazole  (PROTONIX ) 40 MG tablet Take 1 tablet (40 mg total) by mouth daily. 30 tablet 3   rosuvastatin  (CRESTOR ) 40 MG tablet TAKE ONE TABLET BY MOUTH DAILY 90 tablet 3   TURMERIC PO Take 1,500 mg by mouth daily.     vitamin B-12 (CYANOCOBALAMIN ) 1000 MCG tablet Take 1,000 mcg by mouth in the morning.      Allergies as of 06/26/2023 - Review Complete 06/26/2023  Allergen Reaction Noted   Codeine Nausea Only 04/15/2017   Sulfamethoxazole  07/10/2006    Family History  Problem Relation Age of Onset   Melanoma Mother 3       labia   Heart attack Father 22       smoker, rheumatic fever as child   Colon cancer Neg Hx    Colon polyps Neg Hx    Esophageal cancer Neg Hx    Rectal cancer Neg Hx    Stomach cancer Neg Hx     Social History   Socioeconomic History   Marital status: Married    Spouse name: Brad   Number of children: 1   Years of education: 14   Highest education level: Associate degree: academic program  Occupational History   Occupation: Retired  Tobacco Use   Smoking status: Former    Current packs/day: 0.00    Average packs/day: 0.8 packs/day for 50.0 years (37.5 ttl pk-yrs)    Types: Cigarettes    Start date: 03/08/1963     Quit date: 03/07/2013    Years since quitting: 10.3   Smokeless tobacco: Never  Vaping Use   Vaping status: Never Used  Substance and Sexual Activity   Alcohol use: Yes    Comment: 1-2 daily spritzers per pt   Drug use: No   Sexual activity: Not on file  Other Topics Concern   Not on file  Social History Narrative   Married to [employer] (patient) in 73. 2nd marriage-1 child with 1 adopted grandchild.       Retired as Psychologist, forensic.       Hobbies: antiques, former Armed forces operational officer      Exercise: none currently.       Patient is right-handed. She lives with her husband in a two level home. She does not exercise.      Social Drivers of Corporate investment banker Strain: Low Risk  (11/19/2021)   Overall Financial Resource Strain (CARDIA)    Difficulty of Paying Living Expenses: Not hard at all  Food Insecurity: No Food Insecurity (11/19/2021)   Hunger Vital Sign    Worried About Running Out of Food in the Last Year: Never true    Ran Out of Food in the Last Year: Never true  Transportation Needs: No Transportation Needs (11/19/2021)   PRAPARE - Administrator, Civil Service (Medical): No    Lack of Transportation (Non-Medical): No  Physical Activity: Inactive (11/19/2021)  Exercise Vital Sign    Days of Exercise per Week: 0 days    Minutes of Exercise per Session: 0 min  Stress: No Stress Concern Present (11/19/2021)   [employer] of Occupational Health - Occupational Stress Questionnaire    Feeling of Stress : Not at all  Social Connections: Moderately Integrated (11/19/2021)   Social Connection and Isolation Panel    Frequency of Communication with Friends and Family: More than three times a week    Frequency of Social Gatherings with Friends and Family: More than three times a week    Attends Religious Services: Never    Database administrator or Organizations: Yes    Attends Banker Meetings: 1 to 4 times per year    Marital  Status: Married  Catering manager Violence: Not At Risk (11/19/2021)   Humiliation, Afraid, Rape, and Kick questionnaire    Fear of Current or Ex-Partner: No    Emotionally Abused: No    Physically Abused: No    Sexually Abused: No   Review of Systems: Gen: Denies fever, sweats or chills. No weight loss.  CV: See HPI. Resp: Denies cough, shortness of breath of hemoptysis.  GI: See HPI.   GU : Denies urinary burning, blood in urine, increased urinary frequency or incontinence. MS: Denies joint pain, muscles aches or weakness. Derm: Denies rash, itchiness, skin lesions or unhealing ulcers. Psych: + Depression.  Heme: Denies easy bruising, bleeding. Neuro:  Denies headaches, dizziness or paresthesias. Endo:  Denies any problems with DM, thyroid  or adrenal function.  Physical Exam: Vital signs in last 24 hours: Temp:  [98.2 F (36.8 C)] 98.2 F (36.8 C) (06/19 0731) Pulse Rate:  [66-88] 88 (06/19 0915) Resp:  [18-22] 22 (06/19 0915) BP: (145-156)/(56-75) 147/74 (06/19 0915) SpO2:  [92 %-96 %] 96 % (06/19 0915) Weight:  [65.8 kg] 65.8 kg (06/19 0639)   General: Alert 78 year old female slightly anxious appearing in no acute distress. Head:  Normocephalic and atraumatic. Eyes:  No scleral icterus. Conjunctiva pink. Ears:  Normal auditory acuity. Nose:  No deformity, discharge or lesions. Mouth:  Dentition intact. No ulcers or lesions.  Neck:  Supple. No lymphadenopathy or thyromegaly.  Lungs: Breath sounds clear throughout. No wheezes, rhonchi or crackles.  Heart: Regular rate and rhythm, + systolic murmur. Abdomen: Soft, mildly distended. Tenderness to the LUQ, epigastric and RUQ without rebound or guarding.  Positive bowel sounds to all 4 quadrants. No hepatosplenomegaly. No palpable mass. No bruit. Rectal: Deferred. Musculoskeletal:  Symmetrical without gross deformities.  Pulses:  Normal pulses noted. Extremities:  Without clubbing or edema. Neurologic:  Alert and   oriented x 4. No focal deficits.  Skin:  Intact without significant lesions or rashes. Psych:  Alert and cooperative. Normal mood and affect.  Intake/Output from previous day: 06/18 0701 - 06/19 0700 In: 100 [I.V.:100] Out: -  Intake/Output this shift: No intake/output data recorded.  Lab Results: Recent Labs    06/26/23 0650  WBC 7.6  HGB 13.8  HCT 41.5  PLT 183   BMET Recent Labs    06/26/23 0650  NA 142  K 2.9*  CL 106  CO2 23  GLUCOSE 131*  BUN 14  CREATININE 1.02*  CALCIUM  9.7   LFT Recent Labs    06/26/23 0650  PROT 6.8  ALBUMIN 4.1  AST 640*  ALT 235*  ALKPHOS 131*  BILITOT 1.3*   PT/INR No results for input(s): LABPROT, INR in the last 72 hours. Hepatitis Panel No  results for input(s): HEPBSAG, HCVAB, HEPAIGM, HEPBIGM in the last 72 hours.    Studies/Results: CT ABDOMEN PELVIS W CONTRAST Result Date: 06/26/2023 CLINICAL DATA:  Nausea and vomiting EXAM: CT ABDOMEN AND PELVIS WITH CONTRAST TECHNIQUE: Multidetector CT imaging of the abdomen and pelvis was performed using the standard protocol following bolus administration of intravenous contrast. RADIATION DOSE REDUCTION: This exam was performed according to the departmental dose-optimization program which includes automated exposure control, adjustment of the mA and/or kV according to patient size and/or use of iterative reconstruction technique. CONTRAST:  OMNIPAQUE  IOHEXOL  300 MG/ML  SOLN COMPARISON:  CT abdomen and pelvis dated 06/06/2023 FINDINGS: Lower chest: Mild bilateral lower lobe bronchiectasis. No pleural effusion or pneumothorax demonstrated. Partially imaged heart size is normal. Hepatobiliary: No focal hepatic lesions. No intra or extrahepatic biliary ductal dilation. Normal gallbladder. Pancreas: No focal lesions or main ductal dilation. Mild peripancreatic stranding involving the pancreatic head with small volume free fluid. No peripancreatic fluid collection. Spleen:  Normal in size without focal abnormality. Adrenals/Urinary Tract: No adrenal nodules. No suspicious renal mass, calculi or hydronephrosis. No focal bladder wall thickening. Stomach/Bowel: Normal appearance of the stomach. Small duodenal diverticulum arising from the medial second portion of the duodenum and a moderate duodenal diverticulum arises from the superior aspect of the third portion. No abnormal bowel mural thickening. No abnormal dilation. Sigmoid diverticulosis without acute diverticulitis. Normal appendix. Vascular/Lymphatic: Aortic atherosclerosis. No enlarged abdominal or pelvic lymph nodes. Reproductive: No adnexal masses. Other: No free air. Small volume right hemi abdominal free fluid extending inferiorly along the anterior renal fascia. No fluid collection. Musculoskeletal: No acute or abnormal lytic or blastic osseous lesions. Multilevel degenerative changes of the partially imaged thoracic and lumbar spine. Unchanged L5 compression deformity and grade 1 anterolisthesis at L4-5 IMPRESSION: 1. Findings most likely represent acute interstitial edematous pancreatitis. No peripancreatic fluid collection. Although the peripancreatic free fluid surrounds the proximal duodenum and extend along the right hemiabdomen, a duodenal pathology, such as duodenitis or perforated duodenal ulcer is considered less likely given the absence of duodenal mural thickening or free air. 2. Sigmoid diverticulosis without acute diverticulitis. 3.  Aortic Atherosclerosis (ICD10-I70.0). Electronically Signed   By: Limin  Xu M.D.   On: 06/26/2023 08:40    IMPRESSION/PLAN:  78 year old female admitted with N/V and epigastric pain which radiates to the central mid and lower abdomen with elevated LFTs and lipase level. T. Bili 1.3. Alk phos 131. AST 640 and ALT 235 (AST 246 and ALT 70 two weeks ago when seen in the ED for chest/abd pain, treated with Augmentin  for early diverticulitis). Lipase > 2,8000.  Normal calcium   level. Triglyceride level pending.  Acute hepatitis panel pending.  Patient drinks 2 to 4 ounces of wine every evening. CTAP evidence of acute pancreatitis with peripancreatic free fluid surrounding the proximal duodenum and extends along the right hemiabdomen, normal liver and gallbladder without intra/extrahepatic biliary ductal dilatation.  -Abdominal MRI/MRCP with and without contrast to further evaluate the gallbladder, biliary tract and pancreas as presenting symptoms are concerning for biliary etiology despite CT showing a normal gallbladder and liver, to discuss further with Dr. Willy Harvest. ADDENDUM: Check RUQ sono prior to pursuing MRI/MRCP. -Await triglyceride results  -Clear liquid diet -IV fluids and pain management per the hospitalist -Ondansetron  4 mg PR IV every 6 hours as needed -PT/INR -Hepatic panel and lipase level in am  Sigmoid diverticulitis per CTAP done 06/06/2023 in the ED. Treated with Augmentin  875mg  po bid x 7 days. LFTs  were elevated prior to initiating Augmentin . CTAP today showed diverticulosis without evidence of diverticulitis   GERD. CTAP showed the peripancreatic free fluid surrounds the proximal duodenum and extends along the right hemiabdomen, less likely duodenitis or perforated duodenal ulcer. -Pantoprazole  40 mg IV daily -Defer endoscopic recommendations to Dr. Willy Harvest  History of colon polyps.  Her most recent colonoscopy 02/2022 identified 6 tubular adenomatous polyps removed from the colon and severe diverticulosis in the sigmoid and descending colon.    Alphonza Ashing Kennedy-Smith  06/26/2023, 11:13  AM     [facility] Attending   I have taken an interval history, reviewed the chart and examined the patient. I agree with the Advanced Practitioner's note, impression and recommendations with the following additions:  Acute pancreatitis w/ abnormal LFT's - cause not clear. TG level only 248. No gallstones and no biliary dilation on imaging including US .  Regular alcohol use since 12/24. Radiologist raised possibility of duodenal perforation but as they said no free air so think not.  Fortunately she is improving which also goes against perforation.  Alcohol could be a cause. She has fatty liver also - ? Some alcoholic hepatitis as transaminases were up at diverticulitis dx.  Would continue aggressive supportive care and we will follow-up.  Kenney Peacemaker, MD, [facility] See Tilford Foley on call - gastroenterology for best contact person 06/26/2023 5:48 PM

## 2023-06-26 NOTE — H&P (Signed)
 History and Physical  Crystal Carroll KGM:[id number] DOB: 03/23/1945 DOA: 06/26/2023  PCP: Almira Jaeger, MD   Chief Complaint: Abdominal pain, vomiting  HPI: Crystal Carroll is a 78 y.o. female with medical history significant for GERD, CKD stage III, hypertension recently treated for diverticulitis being admitted to the hospital with acute pancreatitis.  She has never had pancreatitis in the past, she was diagnosed on 5/30 at [facility] ER with early diverticulitis when she presented there with lower abdominal pain.  She took a course of Augmentin  and her symptoms resolved.  She also recently completed a course of oral prednisone  for right knee osteoarthritis.  Otherwise no new medications.  About 3:45 AM today, she had sudden onset of diffuse abdominal pain centered about the upper epigastrium, with radiation intermittently to the back.  There was associated nausea and vomiting.  She denies any fevers, chills, diarrhea.  She drinks alcohol socially, does not drink heavily.  Denies any hematemesis, hematochezia, or change in stool color.  Evaluation in the emergency department as detailed below shows evidence of acute pancreatitis.  She has been started on IV fluids, pain and nausea medication.  ER provider has contacted gastroenterology for consultation, and also requests hospitalist admission.  Review of Systems: Please see HPI for pertinent positives and negatives. A complete 10 system review of systems are otherwise negative.  Past Medical History:  Diagnosis Date   Anxiety    Aortic atherosclerosis (HCC)    Aortic valvar stenosis 11/2018   Mild, noted on ECHO   Arthritis    cervical neck   CKD (chronic kidney disease), stage III (HCC)    Stable   GERD (gastroesophageal reflux disease)    History of TIA (transient ischemic attack)    per patient, didn't know about it   Hyperlipidemia    Hyperparathyroidism (HCC)    Hypertension    History of    Junctional rhythm    Osteoporosis     Palpitations    Thyroid  nodule    2.0 cm solid nodule in the inferior left thyroid  lobe    Past Surgical History:  Procedure Laterality Date   CERVICAL LAMINECTOMY  2004   [facility]   COLONOSCOPY  05/31/2014   Arvie Latus   COLONOSCOPY WITH PROPOFOL  N/A 02/14/2022   Procedure: COLONOSCOPY WITH PROPOFOL ;  Surgeon: Elois Hair, MD;  Location: [facility] ENDOSCOPY;  Service: Gastroenterology;  Laterality: N/A;   ESOPHAGOGASTRODUODENOSCOPY     PARATHYROIDECTOMY Left 01/13/2019   Procedure: LEFT PARATHYROIDECTOMY;  Surgeon: Oralee Billow, MD;  Location: [facility] ORS;  Service: General;  Laterality: Left;   POLYPECTOMY  02/14/2022   Procedure: POLYPECTOMY;  Surgeon: Elois Hair, MD;  Location: [facility] ENDOSCOPY;  Service: Gastroenterology;;   THYROID  LOBECTOMY Left 01/13/2019   Procedure: LEFT THYROID  LOBECTOMY;  Surgeon: Oralee Billow, MD;  Location: [facility] ORS;  Service: General;  Laterality: Left;   Social History:  reports that she quit smoking about 10 years ago. Her smoking use included cigarettes. She started smoking about 60 years ago. She has a 37.5 pack-year smoking history. She has never used smokeless tobacco. She reports current alcohol use. She reports that she does not use drugs.  Allergies  Allergen Reactions   Codeine Nausea Only   Sulfamethoxazole     GI upset    Family History  Problem Relation Age of Onset   Melanoma Mother 44       labia   Heart attack Father 44       smoker, rheumatic  fever as child   Colon cancer Neg Hx    Colon polyps Neg Hx    Esophageal cancer Neg Hx    Rectal cancer Neg Hx    Stomach cancer Neg Hx      Prior to Admission medications   Medication Sig Start Date End Date Taking? Authorizing Provider  acetaminophen  (TYLENOL ) 500 MG tablet Take 1,000 mg by mouth 2 (two) times daily.    [provider]  ascorbic acid (VITAMIN C) 500 MG tablet Take 500 mg by mouth in the morning.    [provider]  aspirin EC 81 MG tablet Take 81 mg by  mouth at bedtime.     [provider]  buPROPion  (WELLBUTRIN  XL) 150 MG 24 hr tablet Take 1 tablet (150 mg total) by mouth daily. 03/31/23   Almira Jaeger, MD  busPIRone  (BUSPAR ) 10 MG tablet Take 1 tablet (10 mg total) by mouth daily. 12/03/22   Almira Jaeger, MD  Cholecalciferol (VITAMIN D3) 50 MCG (2000 UT) TABS Take 2,000 Units by mouth in the morning.    [provider]  citalopram  (CELEXA ) 20 MG tablet TAKE ONE TABLET BY MOUTH ONCE DAILY 06/10/22   Almira Jaeger, MD  clobetasol  ointment (TEMOVATE ) 0.05 % Apply a pea sized amount BID for 2 weeks, then apply 2 x a week for the next month 09/26/22   Alexander Iba, PA  ezetimibe  (ZETIA ) 10 MG tablet Take 1 tablet (10 mg total) by mouth daily. 06/10/22   Almira Jaeger, MD  gabapentin  (NEURONTIN ) 100 MG capsule Take 1 capsule (100 mg total) by mouth at bedtime. 07/16/22   Ulysees Gander, DO  ketoconazole  (NIZORAL ) 2 % cream Apply to affected area 1-2 times daily 09/26/22   Alexander Iba, PA  pantoprazole  (PROTONIX ) 40 MG tablet Take 1 tablet (40 mg total) by mouth daily. 06/19/23   Almira Jaeger, MD  rosuvastatin  (CRESTOR ) 40 MG tablet TAKE ONE TABLET BY MOUTH DAILY 06/10/22   Almira Jaeger, MD  TURMERIC PO Take 1,500 mg by mouth daily.    [provider]  vitamin B-12 (CYANOCOBALAMIN ) 1000 MCG tablet Take 1,000 mcg by mouth in the morning.    [provider]    Physical Exam: BP (!) 147/74   Pulse 88   Temp 98.2 F (36.8 C) (Oral)   Resp (!) 22   Ht 5' 4 (1.626 m)   Wt 65.8 kg   LMP  (LMP Unknown)   SpO2 96%   BMI 24.89 kg/m  General:  Alert, oriented, calm, in mild distress due to nausea.  Pleasant and cooperative, looks slightly dehydrated. Cardiovascular: RRR, no murmurs or rubs, no peripheral edema  Respiratory: clear to auscultation bilaterally, no wheezes, no crackles  Abdomen: soft, tender, slightly distended, normal bowel tones heard  Skin: dry, no rashes   Musculoskeletal: no joint effusions, normal range of motion  Psychiatric: appropriate affect, normal speech  Neurologic: extraocular muscles intact, clear speech, moving all extremities with intact sensorium         Labs on Admission:  Basic Metabolic Panel: Recent Labs  Lab 06/26/23 0650  NA 142  K 2.9*  CL 106  CO2 23  GLUCOSE 131*  BUN 14  CREATININE 1.02*  CALCIUM  9.7   Liver Function Tests: Recent Labs  Lab 06/26/23 0650  AST 640*  ALT 235*  ALKPHOS 131*  BILITOT 1.3*  PROT 6.8  ALBUMIN 4.1   Recent Labs  Lab 06/26/23 0650  LIPASE >  2,800*   No results for input(s): AMMONIA in the last 168 hours. CBC: Recent Labs  Lab 06/26/23 0650  WBC 7.6  NEUTROABS 7.3  HGB 13.8  HCT 41.5  MCV 91.6  PLT 183   Cardiac Enzymes: No results for input(s): CKTOTAL, CKMB, CKMBINDEX, TROPONINI in the last 168 hours. BNP (last 3 results) No results for input(s): BNP in the last 8760 hours.  ProBNP (last 3 results) No results for input(s): PROBNP in the last 8760 hours.  CBG: No results for input(s): GLUCAP in the last 168 hours.  Radiological Exams on Admission: CT ABDOMEN PELVIS W CONTRAST Result Date: 06/26/2023 CLINICAL DATA:  Nausea and vomiting EXAM: CT ABDOMEN AND PELVIS WITH CONTRAST TECHNIQUE: Multidetector CT imaging of the abdomen and pelvis was performed using the standard protocol following bolus administration of intravenous contrast. RADIATION DOSE REDUCTION: This exam was performed according to the departmental dose-optimization program which includes automated exposure control, adjustment of the mA and/or kV according to patient size and/or use of iterative reconstruction technique. CONTRAST:  OMNIPAQUE  IOHEXOL  300 MG/ML  SOLN COMPARISON:  CT abdomen and pelvis dated 06/06/2023 FINDINGS: Lower chest: Mild bilateral lower lobe bronchiectasis. No pleural effusion or pneumothorax demonstrated. Partially imaged heart size is normal.  Hepatobiliary: No focal hepatic lesions. No intra or extrahepatic biliary ductal dilation. Normal gallbladder. Pancreas: No focal lesions or main ductal dilation. Mild peripancreatic stranding involving the pancreatic head with small volume free fluid. No peripancreatic fluid collection. Spleen: Normal in size without focal abnormality. Adrenals/Urinary Tract: No adrenal nodules. No suspicious renal mass, calculi or hydronephrosis. No focal bladder wall thickening. Stomach/Bowel: Normal appearance of the stomach. Small duodenal diverticulum arising from the medial second portion of the duodenum and a moderate duodenal diverticulum arises from the superior aspect of the third portion. No abnormal bowel mural thickening. No abnormal dilation. Sigmoid diverticulosis without acute diverticulitis. Normal appendix. Vascular/Lymphatic: Aortic atherosclerosis. No enlarged abdominal or pelvic lymph nodes. Reproductive: No adnexal masses. Other: No free air. Small volume right hemi abdominal free fluid extending inferiorly along the anterior renal fascia. No fluid collection. Musculoskeletal: No acute or abnormal lytic or blastic osseous lesions. Multilevel degenerative changes of the partially imaged thoracic and lumbar spine. Unchanged L5 compression deformity and grade 1 anterolisthesis at L4-5 IMPRESSION: 1. Findings most likely represent acute interstitial edematous pancreatitis. No peripancreatic fluid collection. Although the peripancreatic free fluid surrounds the proximal duodenum and extend along the right hemiabdomen, a duodenal pathology, such as duodenitis or perforated duodenal ulcer is considered less likely given the absence of duodenal mural thickening or free air. 2. Sigmoid diverticulosis without acute diverticulitis. 3.  Aortic Atherosclerosis (ICD10-I70.0). Electronically Signed   By: Limin  Xu M.D.   On: 06/26/2023 08:40   Assessment/Plan Lakrista A Lady is a 78 y.o. female with medical history  significant for GERD, CKD stage III, hypertension recently treated for diverticulitis being admitted to the hospital with acute pancreatitis.  Acute pancreatitis-no peripancreatic fluid collection, necrosis or other complication.  Unclear etiology, differential is broad however with abnormal LFTs query biliary obstructive process. -Inpatient admission -N.p.o. -IV fluids -Pain and nausea control -Check triglycerides -GI consult appreciated  Abnormal LFTs-first noted to be abnormal 5/30, have now worsened -Check acute hepatitis panel, Tylenol  level -Consider MRCP or other advanced imaging, will defer to GI  Atrial fibrillation-noted on EKG in the ER on 5/30.  Plan is for outpatient cardiology referral.  Here, she is in sinus rhythm per EKG. -Will monitor on telemetry while in the  hospital  Hyperlipidemia-will hold statin in the setting of abnormal LFTs  Anxiety/depression-will resume home medications once reconciled  CKD stage III-GFR is 56 today  Hypokalemia-due to GI losses from vomiting, repleted  DVT prophylaxis: Lovenox     Code Status: Full Code  Consults called: GI  Admission status: The appropriate patient status for this patient is INPATIENT. Inpatient status is judged to be reasonable and necessary in order to provide the required intensity of service to ensure the patient's safety. The patient's presenting symptoms, physical exam findings, and initial radiographic and laboratory data in the context of their chronic comorbidities is felt to place them at high risk for further clinical deterioration. Furthermore, it is not anticipated that the patient will be medically stable for discharge from the hospital within 2 midnights of admission.    I certify that at the point of admission it is my clinical judgment that the patient will require inpatient hospital care spanning beyond 2 midnights from the point of admission due to high intensity of service, high risk for further  deterioration and high frequency of surveillance required  Time spent: 49 minutes  Yehudah Standing Rickey Charm MD [facility] Pager [phone]  If 7PM-7AM, please contact night-coverage [email or web address] Password [facility]  06/26/2023, 9:45 AM

## 2023-06-26 NOTE — ED Notes (Signed)
 Denies chest pain/sob at this time, endorses abd pain/upset stomach and nausea. Asked provider for more nausea meds per request of pt

## 2023-06-26 NOTE — ED Notes (Signed)
 Placed on [facility] for low o2 sats in high 80's (88-89%)

## 2023-06-26 NOTE — ED Provider Notes (Addendum)
 [place] EMERGENCY DEPARTMENT AT [facility] Provider Note   CSN: [id number] Arrival date & time: 06/26/23  4098     Patient presents with: Emesis   Crystal Carroll is a 78 y.o. female.   Patient with acute onset of multiple episodes of vomiting approximately 6 starting at 345 this morning.  No diarrhea but felt like she needs to go.  Associated with generalized abdominal pain.  No vomiting of any blood.  EMS gave her 4 mg of Zofran  and 100 mL of normal saline.  Vomiting improved with Zofran .  Patient is not on blood thinners.  No sick contacts.  Patient lives in apartment by herself.  Past medical history sniffer hyperlipidemia gastroesophageal reflux disease hyperparathyroidism hypertension chronic kidney disease stage III.  Patient former smoker quit 2015.       Prior to Admission medications   Medication Sig Start Date End Date Taking? Authorizing Provider  acetaminophen  (TYLENOL ) 500 MG tablet Take 1,000 mg by mouth 2 (two) times daily.    [provider]  ascorbic acid (VITAMIN C) 500 MG tablet Take 500 mg by mouth in the morning.    [provider]  aspirin EC 81 MG tablet Take 81 mg by mouth at bedtime.     [provider]  buPROPion  (WELLBUTRIN  XL) 150 MG 24 hr tablet Take 1 tablet (150 mg total) by mouth daily. 03/31/23   Almira Jaeger, MD  busPIRone  (BUSPAR ) 10 MG tablet Take 1 tablet (10 mg total) by mouth daily. 12/03/22   Almira Jaeger, MD  Cholecalciferol (VITAMIN D3) 50 MCG (2000 UT) TABS Take 2,000 Units by mouth in the morning.    [provider]  citalopram  (CELEXA ) 20 MG tablet TAKE ONE TABLET BY MOUTH ONCE DAILY 06/10/22   Almira Jaeger, MD  clobetasol  ointment (TEMOVATE ) 0.05 % Apply a pea sized amount BID for 2 weeks, then apply 2 x a week for the next month 09/26/22   Alexander Iba, PA  ezetimibe  (ZETIA ) 10 MG tablet Take 1 tablet (10 mg total) by mouth daily. 06/10/22   Almira Jaeger, MD   gabapentin  (NEURONTIN ) 100 MG capsule Take 1 capsule (100 mg total) by mouth at bedtime. 07/16/22   Ulysees Gander, DO  ketoconazole  (NIZORAL ) 2 % cream Apply to affected area 1-2 times daily 09/26/22   Alexander Iba, PA  pantoprazole  (PROTONIX ) 40 MG tablet Take 1 tablet (40 mg total) by mouth daily. 06/19/23   Almira Jaeger, MD  rosuvastatin  (CRESTOR ) 40 MG tablet TAKE ONE TABLET BY MOUTH DAILY 06/10/22   Almira Jaeger, MD  TURMERIC PO Take 1,500 mg by mouth daily.    [provider]  vitamin B-12 (CYANOCOBALAMIN ) 1000 MCG tablet Take 1,000 mcg by mouth in the morning.    [provider]    Allergies: Codeine and Sulfamethoxazole    Review of Systems  Constitutional:  Negative for chills and fever.  HENT:  Negative for ear pain and sore throat.   Eyes:  Negative for pain and visual disturbance.  Respiratory:  Negative for cough and shortness of breath.   Cardiovascular:  Negative for chest pain and palpitations.  Gastrointestinal:  Positive for abdominal pain, nausea and vomiting.  Genitourinary:  Negative for dysuria and hematuria.  Musculoskeletal:  Negative for arthralgias and back pain.  Skin:  Negative for color change and rash.  Neurological:  Negative for seizures and syncope.  All other systems reviewed and are negative.   Updated  Vital Signs BP (!) 156/59   Pulse 81   Temp 98.2 F (36.8 C) (Oral)   Resp 18   Ht 1.626 m (5' 4)   Wt 65.8 kg   LMP  (LMP Unknown)   SpO2 95%   BMI 24.89 kg/m   Physical Exam Vitals and nursing note reviewed.  Constitutional:      General: She is not in acute distress.    Appearance: Normal appearance. She is well-developed.  HENT:     Head: Normocephalic and atraumatic.     Mouth/Throat:     Mouth: Mucous membranes are dry.   Eyes:     Extraocular Movements: Extraocular movements intact.     Conjunctiva/sclera: Conjunctivae normal.     Pupils: Pupils are equal, round, and reactive to light.     Cardiovascular:     Rate and Rhythm: Normal rate and regular rhythm.     Heart sounds: No murmur heard. Pulmonary:     Effort: Pulmonary effort is normal. No respiratory distress.     Breath sounds: Normal breath sounds.  Abdominal:     Palpations: Abdomen is soft.     Tenderness: There is no abdominal tenderness.   Musculoskeletal:        General: No swelling.     Cervical back: Normal range of motion and neck supple.   Skin:    General: Skin is warm and dry.     Capillary Refill: Capillary refill takes less than 2 seconds.   Neurological:     General: No focal deficit present.     Mental Status: She is alert and oriented to person, place, and time.   Psychiatric:        Mood and Affect: Mood normal.     (all labs ordered are listed, but only abnormal results are displayed) Labs Reviewed  CBC WITH DIFFERENTIAL/PLATELET - Abnormal; Notable for the following components:      Result Value   Lymphs Abs 0.2 (*)    Monocytes Absolute 0.0 (*)    All other components within normal limits  COMPREHENSIVE METABOLIC PANEL WITH GFR - Abnormal; Notable for the following components:   Potassium 2.9 (*)    Glucose, Bld 131 (*)    Creatinine, Ser 1.02 (*)    AST 640 (*)    ALT 235 (*)    Alkaline Phosphatase 131 (*)    Total Bilirubin 1.3 (*)    GFR, Estimated 56 (*)    All other components within normal limits  LIPASE, BLOOD - Abnormal; Notable for the following components:   Lipase >2,800 (*)    All other components within normal limits  URINALYSIS, ROUTINE W REFLEX MICROSCOPIC    EKG: None  Radiology: CT ABDOMEN PELVIS W CONTRAST Result Date: 06/26/2023 CLINICAL DATA:  Nausea and vomiting EXAM: CT ABDOMEN AND PELVIS WITH CONTRAST TECHNIQUE: Multidetector CT imaging of the abdomen and pelvis was performed using the standard protocol following bolus administration of intravenous contrast. RADIATION DOSE REDUCTION: This exam was performed according to the departmental  dose-optimization program which includes automated exposure control, adjustment of the mA and/or kV according to patient size and/or use of iterative reconstruction technique. CONTRAST:  OMNIPAQUE  IOHEXOL  300 MG/ML  SOLN COMPARISON:  CT abdomen and pelvis dated 06/06/2023 FINDINGS: Lower chest: Mild bilateral lower lobe bronchiectasis. No pleural effusion or pneumothorax demonstrated. Partially imaged heart size is normal. Hepatobiliary: No focal hepatic lesions. No intra or extrahepatic biliary ductal dilation. Normal gallbladder. Pancreas: No focal lesions or main  ductal dilation. Mild peripancreatic stranding involving the pancreatic head with small volume free fluid. No peripancreatic fluid collection. Spleen: Normal in size without focal abnormality. Adrenals/Urinary Tract: No adrenal nodules. No suspicious renal mass, calculi or hydronephrosis. No focal bladder wall thickening. Stomach/Bowel: Normal appearance of the stomach. Small duodenal diverticulum arising from the medial second portion of the duodenum and a moderate duodenal diverticulum arises from the superior aspect of the third portion. No abnormal bowel mural thickening. No abnormal dilation. Sigmoid diverticulosis without acute diverticulitis. Normal appendix. Vascular/Lymphatic: Aortic atherosclerosis. No enlarged abdominal or pelvic lymph nodes. Reproductive: No adnexal masses. Other: No free air. Small volume right hemi abdominal free fluid extending inferiorly along the anterior renal fascia. No fluid collection. Musculoskeletal: No acute or abnormal lytic or blastic osseous lesions. Multilevel degenerative changes of the partially imaged thoracic and lumbar spine. Unchanged L5 compression deformity and grade 1 anterolisthesis at L4-5 IMPRESSION: 1. Findings most likely represent acute interstitial edematous pancreatitis. No peripancreatic fluid collection. Although the peripancreatic free fluid surrounds the proximal duodenum and extend  along the right hemiabdomen, a duodenal pathology, such as duodenitis or perforated duodenal ulcer is considered less likely given the absence of duodenal mural thickening or free air. 2. Sigmoid diverticulosis without acute diverticulitis. 3.  Aortic Atherosclerosis (ICD10-I70.0). Electronically Signed   By: Limin  Xu M.D.   On: 06/26/2023 08:40     Procedures   Medications Ordered in the ED  HYDROmorphone  (DILAUDID ) injection 0.5 mg (0.5 mg Intravenous Not Given 06/26/23 0748)  potassium chloride 10 mEq in 100 mL IVPB (has no administration in time range)  0.9 %  sodium chloride  infusion (has no administration in time range)  ondansetron  (ZOFRAN ) injection 4 mg (4 mg Intravenous Given 06/26/23 0748)  sodium chloride  0.9 % bolus 1,000 mL (1,000 mLs Intravenous New Bag/Given 06/26/23 0748)  iohexol  (OMNIPAQUE ) 300 MG/ML solution 100 mL (100 mLs Intravenous Contrast Given 06/26/23 4782)                                    Medical Decision Making Amount and/or Complexity of Data Reviewed Labs: ordered. Radiology: ordered.  Risk Prescription drug management. Decision regarding hospitalization.   This likely an acute gastritis viral nature.  Because of the diffuse abdominal pain will get CT scan.  Will give IV fluids.  More Zofran .  And some pain medicine.  CBC white count 7.6 hemoglobin 13.8 platelets are 183.  Complete metabolic panel lipase pending.  Patient's complete metabolic panel is significant abnormalities.  Potassium 2.9.  Will give IV potassium.  Because of the nausea and vomiting.  GFR 56 creatinine 1.02 little bit worse than baseline.  AST 640 ALT 235 alk phos 131 total bili 1.3.  Patient's lipase is greater than 2800.  CT scan abdomen represents most likely acute intestinal edematous pancreatitis no peripancreatic fluid collection there is fluid around the duodenum but the duodenum does not seem to be inflamed itself.  States that the hepatobiliary area is normal.  Patient has  been seen by [facility] gastroenterology in the past we will give them a call for consultation and will get her admitted by the hospitalist.  Patient's vital signs remain normal.  Will get magnesium EKG patient's calcium  was normal at 9.7.  Patient denies any alcohol ingestion.  CRITICAL CARE Performed by: Jalynn Betzold Total critical care time: 45 minutes Critical care time was exclusive of separately billable procedures and treating other  patients. Critical care was necessary to treat or prevent imminent or life-threatening deterioration. Critical care was time spent personally by me on the following activities: development of treatment plan with patient and/or surrogate as well as nursing, discussions with consultants, evaluation of patient's response to treatment, examination of patient, obtaining history from patient or surrogate, ordering and performing treatments and interventions, ordering and review of laboratory studies, ordering and review of radiographic studies, pulse oximetry and re-evaluation of patient's condition.  [facility] I will see patient.  They will talk to Dr. Princeton Broom and will probably order an MR CP.  They also agree there is suspicion for gallstone pancreatitis.    Final diagnoses:  Nausea and vomiting, unspecified vomiting type  Generalized abdominal pain  Acute pancreatitis without infection or necrosis, unspecified pancreatitis type    ED Discharge Orders     None          Nicklas Barns, MD 06/26/23 2956    Nicklas Barns, MD 06/26/23 2130    Nicklas Barns, MD 06/26/23 [phone]

## 2023-06-27 DIAGNOSIS — K859 Acute pancreatitis without necrosis or infection, unspecified: Secondary | ICD-10-CM

## 2023-06-27 DIAGNOSIS — K219 Gastro-esophageal reflux disease without esophagitis: Secondary | ICD-10-CM

## 2023-06-27 DIAGNOSIS — R7989 Other specified abnormal findings of blood chemistry: Secondary | ICD-10-CM

## 2023-06-27 DIAGNOSIS — R0789 Other chest pain: Secondary | ICD-10-CM

## 2023-06-27 LAB — COMPREHENSIVE METABOLIC PANEL WITH GFR
ALT: 116 U/L — ABNORMAL HIGH (ref 0–44)
AST: 116 U/L — ABNORMAL HIGH (ref 15–41)
Albumin: 3.2 g/dL — ABNORMAL LOW (ref 3.5–5.0)
Alkaline Phosphatase: 91 U/L (ref 38–126)
Anion gap: 7 (ref 5–15)
BUN: 14 mg/dL (ref 8–23)
CO2: 22 mmol/L (ref 22–32)
Calcium: 8.3 mg/dL — ABNORMAL LOW (ref 8.9–10.3)
Chloride: 109 mmol/L (ref 98–111)
Creatinine, Ser: 0.68 mg/dL (ref 0.44–1.00)
GFR, Estimated: >60 mL/min (ref >60)
Glucose, Bld: 104 mg/dL — ABNORMAL HIGH (ref 70–99)
Potassium: 3.4 mmol/L — ABNORMAL LOW (ref 3.5–5.1)
Sodium: 138 mmol/L (ref 135–145)
Total Bilirubin: 1.1 mg/dL (ref 0.0–1.2)
Total Protein: 5.6 g/dL — ABNORMAL LOW (ref 6.5–8.1)

## 2023-06-27 LAB — CBC
HCT: 35.3 % — ABNORMAL LOW (ref 36.0–46.0)
Hemoglobin: 11.7 g/dL — ABNORMAL LOW (ref 12.0–15.0)
MCH: 31.4 pg (ref 26.0–34.0)
MCHC: 33.1 g/dL (ref 30.0–36.0)
MCV: 94.6 fL (ref 80.0–100.0)
Platelets: 149 10*3/uL — ABNORMAL LOW (ref 150–400)
RBC: 3.73 MIL/uL — ABNORMAL LOW (ref 3.87–5.11)
RDW: 13.2 % (ref 11.5–15.5)
WBC: 11.1 10*3/uL — ABNORMAL HIGH (ref 4.0–10.5)
nRBC: 0 % (ref 0.0–0.2)

## 2023-06-27 LAB — LIPASE, BLOOD: Lipase: 327 U/L — ABNORMAL HIGH (ref 11–51)

## 2023-06-27 NOTE — Progress Notes (Signed)
   06/27/23 1318  TOC Brief Assessment  Insurance and Status Reviewed  Patient has primary care physician Yes  Home environment has been reviewed Apartment  Prior level of function: Independent  Prior/Current Home Services No current home services  Social Drivers of Health Review SDOH reviewed no interventions necessary  Readmission risk has been reviewed Yes  Transition of care needs transition of care needs identified, TOC will continue to follow

## 2023-06-27 NOTE — Progress Notes (Addendum)
 [facility] Progress Note  CC:   Acute pancreatitis, elevated LFTs   Subjective: No further nausea or vomiting.  She endorses having upper abdominal soreness which she attributes to prior vomiting.  She passed a brown bowel movement earlier, no bloody or black stools.  No chest pain or shortness of breath.  In further review of her epic records, she was seen by her PCP Dr. Arlene Ben 06/19/2023 for follow-up after she was seen in the ED for diverticulitis. Prior to presenting to the ED 5/30, she reportedly having 3 episodes of atypical chest pain within the prior 10 days and Dr. Arlene Ben recommended GI and cardiology follow-up.  She recently described having dysphagia, food passes slower down the esophagus but did not get stuck which abated after Pantoprazole  was increased from 20 to 40 mg daily.   Objective:  Vital signs in last 24 hours: Temp:  [98.1 F (36.7 C)-99.4 F (37.4 C)] 98.8 F (37.1 C) (06/20 0506) Pulse Rate:  [67-90] 67 (06/20 0506) Resp:  [18-27] 18 (06/20 0506) BP: (101-147)/(49-76) 112/54 (06/20 0506) SpO2:  [94 %-98 %] 98 % (06/20 0506) Last BM Date : 06/26/23 General: 78 year old female in good spirits. Heart: Regular rate and rhythm, very faint murmur. Pulm: Breath sounds clear on oxygen 2 L nasal cannula. Abdomen: Soft, nondistended.  Mild tenderness throughout the upper abdomen and periumbilical area without rebound or guarding.  No lower abdominal tenderness.  No palpable mass.  No hepatosplenomegaly.  No bruit. Extremities: No edema. Neurologic:  Alert and  oriented x 4. Grossly normal neurologically. Psych:  Alert and cooperative. Normal mood and affect.  Intake/Output from previous day: 06/19 0701 - 06/20 0700 In: 959.6 [P.O.:240; I.V.:569.6; IV Piggyback:150] Out: 200 [Urine:200] Intake/Output this shift: No intake/output data recorded.  Lab Results: Recent Labs    06/26/23 0650 06/27/23 0544  WBC 7.6 11.1*  HGB 13.8 11.7*  HCT 41.5  35.3*  PLT 183 149*   BMET Recent Labs    06/26/23 0650 06/27/23 0544  NA 142 138  K 2.9* 3.4*  CL 106 109  CO2 23 22  GLUCOSE 131* 104*  BUN 14 14  CREATININE 1.02* 0.68  CALCIUM  9.7 8.3*   LFT Recent Labs    06/27/23 0544  PROT 5.6*  ALBUMIN 3.2*  AST 116*  ALT 116*  ALKPHOS 91  BILITOT 1.1   PT/INR Recent Labs    06/26/23 1428  LABPROT 14.4  INR 1.1   Hepatitis Panel Recent Labs    06/26/23 1428  HEPBSAG NON REACTIVE  HCVAB NON REACTIVE  HEPAIGM NON REACTIVE  HEPBIGM NON REACTIVE    US  Abdomen Limited RUQ (LIVER/GB) Result Date: 06/26/2023 CLINICAL DATA:  Elevated liver function tests EXAM: ULTRASOUND ABDOMEN LIMITED RIGHT UPPER QUADRANT COMPARISON:  CT earlier 06/26/2023 and older FINDINGS: Gallbladder: No gallstones or wall thickening visualized. No sonographic Murphy sign noted by sonographer. Common bile duct: Diameter: 2 mm Liver: Mildly echogenic hepatic parenchyma consistent with fatty liver infiltration. Portal vein is patent on color Doppler imaging with normal direction of blood flow towards the liver. Other: Some limitation with overlapping bowel gas and soft tissue. In addition as per the sonographer the patient had difficulty tolerating the procedure. IMPRESSION: Fatty liver infiltration.  No gallstones or ductal dilatation. Electronically Signed   By: Adrianna Horde M.D.   On: 06/26/2023 13:51   CT ABDOMEN PELVIS W CONTRAST Result Date: 06/26/2023 CLINICAL DATA:  Nausea and vomiting EXAM: CT ABDOMEN AND PELVIS WITH CONTRAST TECHNIQUE:  Multidetector CT imaging of the abdomen and pelvis was performed using the standard protocol following bolus administration of intravenous contrast. RADIATION DOSE REDUCTION: This exam was performed according to the departmental dose-optimization program which includes automated exposure control, adjustment of the mA and/or kV according to patient size and/or use of iterative reconstruction technique. CONTRAST:   OMNIPAQUE  IOHEXOL  300 MG/ML  SOLN COMPARISON:  CT abdomen and pelvis dated 06/06/2023 FINDINGS: Lower chest: Mild bilateral lower lobe bronchiectasis. No pleural effusion or pneumothorax demonstrated. Partially imaged heart size is normal. Hepatobiliary: No focal hepatic lesions. No intra or extrahepatic biliary ductal dilation. Normal gallbladder. Pancreas: No focal lesions or main ductal dilation. Mild peripancreatic stranding involving the pancreatic head with small volume free fluid. No peripancreatic fluid collection. Spleen: Normal in size without focal abnormality. Adrenals/Urinary Tract: No adrenal nodules. No suspicious renal mass, calculi or hydronephrosis. No focal bladder wall thickening. Stomach/Bowel: Normal appearance of the stomach. Small duodenal diverticulum arising from the medial second portion of the duodenum and a moderate duodenal diverticulum arises from the superior aspect of the third portion. No abnormal bowel mural thickening. No abnormal dilation. Sigmoid diverticulosis without acute diverticulitis. Normal appendix. Vascular/Lymphatic: Aortic atherosclerosis. No enlarged abdominal or pelvic lymph nodes. Reproductive: No adnexal masses. Other: No free air. Small volume right hemi abdominal free fluid extending inferiorly along the anterior renal fascia. No fluid collection. Musculoskeletal: No acute or abnormal lytic or blastic osseous lesions. Multilevel degenerative changes of the partially imaged thoracic and lumbar spine. Unchanged L5 compression deformity and grade 1 anterolisthesis at L4-5 IMPRESSION: 1. Findings most likely represent acute interstitial edematous pancreatitis. No peripancreatic fluid collection. Although the peripancreatic free fluid surrounds the proximal duodenum and extend along the right hemiabdomen, a duodenal pathology, such as duodenitis or perforated duodenal ulcer is considered less likely given the absence of duodenal mural thickening or free air. 2.  Sigmoid diverticulosis without acute diverticulitis. 3.  Aortic Atherosclerosis (ICD10-I70.0). Electronically Signed   By: Limin  Xu M.D.   On: 06/26/2023 08:40    Patient Profile: Crystal Carroll is a 78 y.o. female with a past medical history of anxiety, arthritis, hyperlipidemia, aortic stenosis, lacunar CVA, hypothyroidism, B12 deficiency, CKD stage III, GERD and colon polyps.   Assessment / Plan:  78 year old female admitted with N/V and epigastric pain which radiates to the central mid and lower abdomen with elevated LFTs and lipase level. T. Bili 1.3 -> 1.1. Alk phos 131 -> 91. AST 640 -> 116. ALT 235 -> 116.  (AST 246 and ALT 70 two weeks ago when seen in the ED for chest/abd pain, treated with Augmentin  for early diverticulitis). Lipase > 2,8000 -> 327.  Normal calcium  level. Triglyceride level 248. Acute hepatitis panel pending. Patient drinks 2 to 4 ounces of white wine every evening. Denies heavy alcohol use/binge drinking. CTAP showed evidence of acute pancreatitis with peripancreatic free fluid surrounding the proximal duodenum and extends along the right hemiabdomen, normal liver and gallbladder without intra/extrahepatic biliary ductal dilatation. RUQ sono showed fatty liver with a normal gallbladder without biliary ductal dilatation.  -Clear liquid diet -IV fluids and pain management per the hospitalist -Ondansetron  4 mg PR IV every 6 hours as needed -Hepatic panel and lipase level in am -Patient counseled no alcohol -Consider abdominal MRI/MRCP if N/V, upper abdominal pain or atypical cp recurs     GERD, recently had dysphagia which abated after Pantoprazole  was increased from 20 to 40 mg daily per her PCP. CTAP showed the peripancreatic  free fluid surrounds the proximal duodenum and extends along the right hemiabdomen, less likely duodenitis or perforated duodenal ulcer. -Pantoprazole  40 mg IV daily -Consider EGD during this hospitalization vs as an outpatient, await further  recommendations to Dr. Willy Harvest  Atypical chest pain, cardiac vs esophageal vs biliary vs anxiety. No cp at this time.  -See plan above -Cardiology evaluation as an outpatient as previously recommended per PCP  Sigmoid diverticulitis per CTAP done 06/06/2023 in the ED. Treated with Augmentin  875mg  po bid x 7 days. LFTs were elevated prior to initiating Augmentin . CTAP today showed diverticulosis without evidence of diverticulitis. No lower abdominal pain.     History of colon polyps.  Her most recent colonoscopy 02/2022 identified 6 tubular adenomatous polyps removed from the colon and severe diverticulosis in the sigmoid and descending colon.   Hypokalemia/ K + 2.9 -> 3.4 -Management per the hospitalist    Principal Problem:   Acute pancreatitis     LOS: 1 day   Tory Freiberg  06/27/2023, 9:41 AM     [facility] Attending   I have taken an interval history, reviewed the chart and examined the patient. I agree with the Advanced Practitioner's note, impression and recommendations with the following additions:  Patient is significantly better.  I am going to advance her diet as she appears to be recovering from pancreatitis.  No plans to do EGD.  She had some vague dysphagia recently which has resolved.  She was having some chest pains which could have been biliary colic I suppose though not typically burning.  Though we do not know the cause of her pancreatitis I do think it could have been biliary in origin.  She does not need a repeat colonoscopy due to diverticulitis, given how recent she had one.  She has a pending referral to cardiology from PCP regarding A-fib and the atypical chest pain.  Agree with that plan.  I will contact my office and we will arrange some outpatient follow-up in the clinic.  Signing off at this time.  Please call back if further questions.  I think she could be discharged by tomorrow if things continue to go well.  She should avoid alcohol  completely in this setting.  Kenney Peacemaker, MD, [facility] See Tilford Foley on call - gastroenterology for best contact person 06/27/2023 10:37 AM

## 2023-06-27 NOTE — Progress Notes (Signed)
 Progress Note   Patient: Crystal Carroll FMW:[id number] DOB: July 27, 1945 DOA: 06/26/2023     1 DOS: the patient was seen and examined on 06/27/2023   Brief hospital course: The patient is a 78 yr old woman who presented to [facility] ED on 06/26/2023 with complaints of sudden onset of diffuse abdominal pain centered about the upper epigastrum.  The patient had recently been discharged from [facility] after a a stay for diverticulitis. She completed her course of antibiotics after discharge.  The patient has been admitted. She was initially placed on a clear liquid diet. She states that she is feeling much better. Her diet has been advanced to a soft diet. Lipase remains elevated at 325, down from greater than 2800 on admission. LFT's are declining. CT abdomen and pelvis confirmed pancreatitis.  The patient drinks 2-5 drinks daily. Likely cause of pancreatitis is alcohol. Triglycerides were 248.  Continue to monitor. I appreciate GI's assistance.  Assessment and Plan: Crystal Carroll is a 78 y.o. female with medical history significant for GERD, CKD stage III, hypertension recently treated for diverticulitis being admitted to the hospital with acute pancreatitis.   Acute pancreatitis-no peripancreatic fluid collection, necrosis or other complication.  Unclear etiology, differential is broad however with abnormal LFTs query biliary obstructive process. -Inpatient admission -Advance diet to a soft diet. -Stop IV fluids -Pain and nausea control -Triglycerides 248 --RUQ US  demonstrated normal gallbladder and no biliary ductal dilatation -GI consult appreciated --GI considering EGD this admission   Abnormal LFTs-first noted to be abnormal 5/30, have now worsened -LFT's resolving -Consider MRCP or other advanced imaging, will defer to GI   Atrial fibrillation-noted on EKG in the ER on 5/30.  Plan is for outpatient cardiology referral.  Here, she is in sinus rhythm per EKG. -Will monitor on telemetry while in  the hospital   Hyperlipidemia-will hold statin in the setting of abnormal LFTs   Anxiety/depression-will resume home medications once reconciled   CKD stage III-GFR is 56 today   Hypokalemia-due to GI losses from vomiting, repleted   DVT prophylaxis: Lovenox       Code Status: Full Code   Consults called: GI       Subjective: The patient states that she is feeling much better. No abdominal pain currently.  Physical Exam: Vitals:   06/26/23 2051 06/27/23 0112 06/27/23 0506 06/27/23 1232  BP: 109/66 (!) 104/50 (!) 112/54 (!) 102/56  Pulse: 69 67 67 (!) 59  Resp: 18 18 18    Temp: 98.6 F (37 C) 99.4 F (37.4 C) 98.8 F (37.1 C) 98.3 F (36.8 C)  TempSrc: Oral Oral Oral Oral  SpO2: 97% 97% 98% 97%  Weight:      Height:       Exam:  Constitutional:  The patient is awake, alert, and oriented x 3. No acute distress. Respiratory:  No increased work of breathing. No wheezes, rales, or rhonchi No tactile fremitus Cardiovascular:  Regular rate and rhythm No murmurs, ectopy, or gallups. No lateral PMI. No thrills. Abdomen:  Abdomen is soft, non-distended Mild epigastric tenderness to palpation No hernias, masses, or organomegaly Normoactive bowel sounds.  Musculoskeletal:  No cyanosis, clubbing, or edema Skin:  No rashes, lesions, ulcers palpation of skin: no induration or nodules Neurologic:  CN 2-12 intact Sensation all 4 extremities intact Psychiatric:  Mental status Mood, affect appropriate Orientation to person, place, time  judgment and insight appear intact  Data Reviewed:  RUQ ultrasound CBC CMP  Family Communication: None available  Disposition:  Status is: Inpatient Remains inpatient appropriate because: Need for ongoing monitoring and possible EGD by GI prior to discharge.  Planned Discharge Destination: Home    Time spent: 36 minutes  Author: Migel Hannis, DO 06/27/2023 6:57 PM  For on call review [email or web address].

## 2023-06-27 NOTE — Plan of Care (Signed)
  Problem: Education: Goal: Knowledge of General Education information will improve Description: Including pain rating scale, medication(s)/side effects and non-pharmacologic comfort measures Outcome: Progressing   Problem: Health Behavior/Discharge Planning: Goal: Ability to manage health-related needs will improve Outcome: Progressing   Problem: Clinical Measurements: Goal: Ability to maintain clinical measurements within normal limits will improve Outcome: Progressing Goal: Will remain free from infection Outcome: Progressing Goal: Diagnostic test results will improve Outcome: Progressing Goal: Respiratory complications will improve Outcome: Progressing Goal: Cardiovascular complication will be avoided Outcome: Progressing   Problem: Activity: Goal: Risk for activity intolerance will decrease Outcome: Progressing   Problem: Nutrition: Goal: Adequate nutrition will be maintained Outcome: Progressing   Problem: Coping: Goal: Level of anxiety will decrease Outcome: Progressing   Problem: Elimination: Goal: Will not experience complications related to bowel motility Outcome: Progressing   Problem: Pain Managment: Goal: General experience of comfort will improve and/or be controlled Outcome: Progressing   Problem: Safety: Goal: Ability to remain free from injury will improve Outcome: Progressing   Problem: Skin Integrity: Goal: Risk for impaired skin integrity will decrease Outcome: Progressing   Problem: Elimination: Goal: Will not experience complications related to urinary retention Outcome: Not Progressing

## 2023-06-28 DIAGNOSIS — K852 Alcohol induced acute pancreatitis without necrosis or infection: Secondary | ICD-10-CM | POA: Diagnosis not present

## 2023-06-28 LAB — BASIC METABOLIC PANEL WITH GFR
Anion gap: 10 (ref 5–15)
BUN: 11 mg/dL (ref 8–23)
CO2: 24 mmol/L (ref 22–32)
Calcium: 8.8 mg/dL — ABNORMAL LOW (ref 8.9–10.3)
Chloride: 105 mmol/L (ref 98–111)
Creatinine, Ser: 0.8 mg/dL (ref 0.44–1.00)
GFR, Estimated: >60 mL/min (ref >60)
Glucose, Bld: 106 mg/dL — ABNORMAL HIGH (ref 70–99)
Potassium: 3.4 mmol/L — ABNORMAL LOW (ref 3.5–5.1)
Sodium: 139 mmol/L (ref 135–145)

## 2023-06-28 LAB — CBC WITH DIFFERENTIAL/PLATELET
Abs Immature Granulocytes: 0.05 10*3/uL (ref 0.00–0.07)
Basophils Absolute: 0 10*3/uL (ref 0.0–0.1)
Basophils Relative: 0 %
Eosinophils Absolute: 0.3 10*3/uL (ref 0.0–0.5)
Eosinophils Relative: 4 %
HCT: 36.9 % (ref 36.0–46.0)
Hemoglobin: 12.1 g/dL (ref 12.0–15.0)
Immature Granulocytes: 1 %
Lymphocytes Relative: 13 %
Lymphs Abs: 1 10*3/uL (ref 0.7–4.0)
MCH: 31.1 pg (ref 26.0–34.0)
MCHC: 32.8 g/dL (ref 30.0–36.0)
MCV: 94.9 fL (ref 80.0–100.0)
Monocytes Absolute: 0.4 10*3/uL (ref 0.1–1.0)
Monocytes Relative: 5 %
Neutro Abs: 5.9 10*3/uL (ref 1.7–7.7)
Neutrophils Relative %: 77 %
Platelets: 143 10*3/uL — ABNORMAL LOW (ref 150–400)
RBC: 3.89 MIL/uL (ref 3.87–5.11)
RDW: 13 % (ref 11.5–15.5)
WBC: 7.5 10*3/uL (ref 4.0–10.5)
nRBC: 0 % (ref 0.0–0.2)

## 2023-06-28 NOTE — Plan of Care (Signed)
  Problem: Education: Goal: Knowledge of General Education information will improve Description: Including pain rating scale, medication(s)/side effects and non-pharmacologic comfort measures 06/28/2023 1639 by Johnie Will HERO, RN Outcome: Adequate for Discharge 06/28/2023 1242 by Johnie Will HERO, RN Outcome: Progressing   Problem: Health Behavior/Discharge Planning: Goal: Ability to manage health-related needs will improve 06/28/2023 1639 by Johnie Will HERO, RN Outcome: Adequate for Discharge 06/28/2023 1242 by Johnie Will HERO, RN Outcome: Progressing   Problem: Clinical Measurements: Goal: Ability to maintain clinical measurements within normal limits will improve 06/28/2023 1639 by Johnie Will HERO, RN Outcome: Adequate for Discharge 06/28/2023 1242 by Johnie Will HERO, RN Outcome: Progressing Goal: Will remain free from infection 06/28/2023 1639 by Johnie Will HERO, RN Outcome: Adequate for Discharge 06/28/2023 1242 by Johnie Will HERO, RN Outcome: Progressing Goal: Diagnostic test results will improve 06/28/2023 1639 by Johnie Will HERO, RN Outcome: Adequate for Discharge 06/28/2023 1242 by Johnie Will HERO, RN Outcome: Progressing Goal: Respiratory complications will improve 06/28/2023 1639 by Johnie Will HERO, RN Outcome: Adequate for Discharge 06/28/2023 1242 by Johnie Will HERO, RN Outcome: Progressing Goal: Cardiovascular complication will be avoided 06/28/2023 1639 by Johnie Will HERO, RN Outcome: Adequate for Discharge 06/28/2023 1242 by Johnie Will HERO, RN Outcome: Progressing   Problem: Activity: Goal: Risk for activity intolerance will decrease 06/28/2023 1639 by Johnie Will HERO, RN Outcome: Adequate for Discharge 06/28/2023 1242 by Johnie Will HERO, RN Outcome: Progressing   Problem: Nutrition: Goal: Adequate nutrition will be maintained 06/28/2023 1639 by Johnie Will HERO, RN Outcome: Adequate for  Discharge 06/28/2023 1242 by Johnie Will HERO, RN Outcome: Progressing   Problem: Coping: Goal: Level of anxiety will decrease 06/28/2023 1639 by Johnie Will HERO, RN Outcome: Adequate for Discharge 06/28/2023 1242 by Johnie Will HERO, RN Outcome: Progressing   Problem: Elimination: Goal: Will not experience complications related to bowel motility 06/28/2023 1639 by Johnie Will HERO, RN Outcome: Adequate for Discharge 06/28/2023 1242 by Johnie Will HERO, RN Outcome: Progressing Goal: Will not experience complications related to urinary retention 06/28/2023 1639 by Johnie Will HERO, RN Outcome: Adequate for Discharge 06/28/2023 1242 by Johnie Will HERO, RN Outcome: Progressing   Problem: Pain Managment: Goal: General experience of comfort will improve and/or be controlled 06/28/2023 1639 by Johnie Will HERO, RN Outcome: Adequate for Discharge 06/28/2023 1242 by Johnie Will HERO, RN Outcome: Progressing   Problem: Safety: Goal: Ability to remain free from injury will improve 06/28/2023 1639 by Johnie Will HERO, RN Outcome: Adequate for Discharge 06/28/2023 1242 by Johnie Will HERO, RN Outcome: Progressing   Problem: Skin Integrity: Goal: Risk for impaired skin integrity will decrease 06/28/2023 1639 by Johnie Will HERO, RN Outcome: Adequate for Discharge 06/28/2023 1242 by Johnie Will HERO, RN Outcome: Progressing

## 2023-06-28 NOTE — Plan of Care (Signed)

## 2023-06-28 NOTE — Progress Notes (Signed)
 Patient waiting on friend to bring clothes from house and then she will be here to pick her up.

## 2023-06-28 NOTE — Progress Notes (Signed)
 Discharge instructions (including medications) discussed with and copy provided to patient. Patient given the opportunity to ask questions. Questions clarified.

## 2023-06-28 NOTE — Discharge Summary (Signed)
 Physician Discharge Summary   Patient: Crystal Carroll MRN: [id number] DOB: 1945/09/04  Admit date:     06/26/2023  Discharge date: 06/28/23  Discharge Physician: Brigida Bureau   PCP: Katrinka Garnette KIDD, MD   Recommendations at discharge:    Discharge to home Follow up with PCP in 7-10 days Seek help with cessation of alcohol Follow up with Dr. Avram as directed.  Discharge Diagnoses: Principal Problem:   Acute pancreatitis  Resolved Problems:   * No resolved hospital problems. [facility] Course: The patient was admitted to a telemetry bed. She was made NPO. She was given IV fluids, antiemetics, and pain control. The following day her diet was advanced to clear fluids and then to a soft diet. Gastroenterology was consulted. They determined that no further work up was necessary. On 06/28/2023 the patient is tolerating a regular diet well. She states that she has no further pain.  I have discussed with the patient her alcohol use and its role in causing pancreatitis. She has stated that she understands that she needs to stop drinking. The patient is now  about 72 hours out from her last drink and shows no signs of withdrawal, so this does not seem to be an issue.  She will be discharged today in fair condition.   Assessment and Plan: Crystal Carroll is a 78 y.o. female with medical history significant for GERD, CKD stage III, hypertension recently treated for diverticulitis being admitted to the hospital with acute pancreatitis.   Acute pancreatitis-no peripancreatic fluid collection, necrosis or other complication. Triglycerides are 248. While this is elevated, it is not in the range typically seen as a cause for pancreatitis.  RUQ US  demonstrated normal gallbladder and no biliary ductal dilatation. I have reviewed the patient's home medications and none seems likely to have caused pancreatitis. Therefore the most likely etiology seems to alcohol. The patient admits to consuming 2-4 oz  etoh daily. She is now pain-free and tolerating a regular soft diet.   Abnormal LFTs-first noted to be abnormal 5/30, have now worsened -LFT's resolving -RUQ US  demonstrates no ductal dilatation or biliary abnormality   Atrial fibrillation-noted on EKG in the ER on 5/30.  Plan is for outpatient cardiology referral.  Here, she is in sinus rhythm per EKG. -Will monitor on telemetry while in the hospital   Hyperlipidemia-will hold statin in the setting of abnormal LFTs   Anxiety/depression-will resume home medications once reconciled   CKD stage III-GFR is 56 today   Hypokalemia-due to GI losses from vomiting, repleted   DVT prophylaxis: Lovenox       Code Status: Full Code   Consults called: GI    Consultants: Gastroenterology Procedures performed: None  Disposition: Home Diet recommendation:  Discharge Diet Orders (From admission, onward)     Start     Ordered   06/28/23 0000  Diet - low sodium heart healthy        06/28/23 1630           Regular diet DISCHARGE MEDICATION: Allergies as of 06/28/2023       Reactions   Codeine Nausea Only   Sulfamethoxazole Nausea Only   GI upset        Medication List     STOP taking these medications    cyanocobalamin  1000 MCG tablet Commonly known as: VITAMIN B12   predniSONE  10 MG tablet Commonly known as: DELTASONE    TURMERIC PO       TAKE these medications  acetaminophen  500 MG tablet Commonly known as: TYLENOL  Take 1,000 mg by mouth at bedtime.   ascorbic acid 500 MG tablet Commonly known as: VITAMIN C Take 500 mg by mouth in the morning.   aspirin EC 81 MG tablet Take 81 mg by mouth at bedtime.   buPROPion  150 MG 24 hr tablet Commonly known as: WELLBUTRIN  XL Take 1 tablet (150 mg total) by mouth daily.   busPIRone  10 MG tablet Commonly known as: BUSPAR  Take 1 tablet (10 mg total) by mouth daily.   citalopram  20 MG tablet Commonly known as: CELEXA  TAKE ONE TABLET BY MOUTH ONCE DAILY    ezetimibe  10 MG tablet Commonly known as: ZETIA  Take 1 tablet (10 mg total) by mouth daily.   pantoprazole  40 MG tablet Commonly known as: PROTONIX  Take 1 tablet (40 mg total) by mouth daily.   rosuvastatin  40 MG tablet Commonly known as: CRESTOR  TAKE ONE TABLET BY MOUTH DAILY   Vitamin D3 50 MCG (2000 UT) Tabs Take 2,000 Units by mouth in the morning.        Discharge Exam: Filed Weights   06/26/23 0639  Weight: 65.8 kg   Exam:  Constitutional:  The patient is awake, alert, and oriented x 3. No acute distress. Respiratory:  No increased work of breathing. No wheezes, rales, or rhonchi No tactile fremitus Cardiovascular:  Regular rate and rhythm No murmurs, ectopy, or gallups. No lateral PMI. No thrills. Abdomen:  Abdomen is soft, non-tender, non-distended No hernias, masses, or organomegaly Normoactive bowel sounds.  Musculoskeletal:  No cyanosis, clubbing, or edema Skin:  No rashes, lesions, ulcers palpation of skin: no induration or nodules Neurologic:  CN 2-12 intact Sensation all 4 extremities intact Psychiatric:  Mental status Mood, affect appropriate Orientation to person, place, time  judgment and insight appear intact   Condition at discharge: fair  The results of significant diagnostics from this hospitalization (including imaging, microbiology, ancillary and laboratory) are listed below for reference.   Imaging Studies: US  Abdomen Limited RUQ (LIVER/GB) Result Date: 06/26/2023 CLINICAL DATA:  Elevated liver function tests EXAM: ULTRASOUND ABDOMEN LIMITED RIGHT UPPER QUADRANT COMPARISON:  CT earlier 06/26/2023 and older FINDINGS: Gallbladder: No gallstones or wall thickening visualized. No sonographic Murphy sign noted by sonographer. Common bile duct: Diameter: 2 mm Liver: Mildly echogenic hepatic parenchyma consistent with fatty liver infiltration. Portal vein is patent on color Doppler imaging with normal direction of blood flow towards the  liver. Other: Some limitation with overlapping bowel gas and soft tissue. In addition as per the sonographer the patient had difficulty tolerating the procedure. IMPRESSION: Fatty liver infiltration.  No gallstones or ductal dilatation. Electronically Signed   By: Ranell Bring M.D.   On: 06/26/2023 13:51   CT ABDOMEN PELVIS W CONTRAST Result Date: 06/26/2023 CLINICAL DATA:  Nausea and vomiting EXAM: CT ABDOMEN AND PELVIS WITH CONTRAST TECHNIQUE: Multidetector CT imaging of the abdomen and pelvis was performed using the standard protocol following bolus administration of intravenous contrast. RADIATION DOSE REDUCTION: This exam was performed according to the departmental dose-optimization program which includes automated exposure control, adjustment of the mA and/or kV according to patient size and/or use of iterative reconstruction technique. CONTRAST:  OMNIPAQUE  IOHEXOL  300 MG/ML  SOLN COMPARISON:  CT abdomen and pelvis dated 06/06/2023 FINDINGS: Lower chest: Mild bilateral lower lobe bronchiectasis. No pleural effusion or pneumothorax demonstrated. Partially imaged heart size is normal. Hepatobiliary: No focal hepatic lesions. No intra or extrahepatic biliary ductal dilation. Normal gallbladder. Pancreas: No focal lesions or  main ductal dilation. Mild peripancreatic stranding involving the pancreatic head with small volume free fluid. No peripancreatic fluid collection. Spleen: Normal in size without focal abnormality. Adrenals/Urinary Tract: No adrenal nodules. No suspicious renal mass, calculi or hydronephrosis. No focal bladder wall thickening. Stomach/Bowel: Normal appearance of the stomach. Small duodenal diverticulum arising from the medial second portion of the duodenum and a moderate duodenal diverticulum arises from the superior aspect of the third portion. No abnormal bowel mural thickening. No abnormal dilation. Sigmoid diverticulosis without acute diverticulitis. Normal appendix.  Vascular/Lymphatic: Aortic atherosclerosis. No enlarged abdominal or pelvic lymph nodes. Reproductive: No adnexal masses. Other: No free air. Small volume right hemi abdominal free fluid extending inferiorly along the anterior renal fascia. No fluid collection. Musculoskeletal: No acute or abnormal lytic or blastic osseous lesions. Multilevel degenerative changes of the partially imaged thoracic and lumbar spine. Unchanged L5 compression deformity and grade 1 anterolisthesis at L4-5 IMPRESSION: 1. Findings most likely represent acute interstitial edematous pancreatitis. No peripancreatic fluid collection. Although the peripancreatic free fluid surrounds the proximal duodenum and extend along the right hemiabdomen, a duodenal pathology, such as duodenitis or perforated duodenal ulcer is considered less likely given the absence of duodenal mural thickening or free air. 2. Sigmoid diverticulosis without acute diverticulitis. 3.  Aortic Atherosclerosis (ICD10-I70.0). Electronically Signed   By: Limin  Xu M.D.   On: 06/26/2023 08:40   CT ABDOMEN PELVIS W CONTRAST Result Date: 06/06/2023 CLINICAL DATA:  Epigastric pain. EXAM: CT ABDOMEN AND PELVIS WITH CONTRAST TECHNIQUE: Multidetector CT imaging of the abdomen and pelvis was performed using the standard protocol following bolus administration of intravenous contrast. RADIATION DOSE REDUCTION: This exam was performed according to the departmental dose-optimization program which includes automated exposure control, adjustment of the mA and/or kV according to patient size and/or use of iterative reconstruction technique. CONTRAST:  60mL OMNIPAQUE  IOHEXOL  350 MG/ML SOLN COMPARISON:  CT abdomen/pelvis dated 04/30/2005. FINDINGS: Lower chest: No acute abnormality. Hepatobiliary: No focal liver abnormality is seen. No gallstones, gallbladder wall thickening, or biliary dilatation. Pancreas: Unremarkable. No pancreatic ductal dilatation or surrounding inflammatory changes.  Spleen: Normal in size without focal abnormality. Adrenals/Urinary Tract: Adrenal glands are unremarkable. Kidneys enhance symmetrically. 1.1 cm cyst arising from the superior pole of the right kidney. Few additional subcentimeter focal hypodensities in the right kidney are too small to definitively characterize. No urolithiasis or hydronephrosis. Bladder is unremarkable. Stomach/Bowel: Stomach is nearly collapsed, limiting detailed evaluation. Moderate-to-large duodenal diverticulum. No evidence of obstruction. Appendix is not clearly identified. Sigmoid colonic diverticulosis with mild short segment wall thickening of the proximal sigmoid colon with trace adjacent free fluid, suggestive of early or mild acute diverticulitis. No evidence of perforation or abscess. Vascular/Lymphatic: The abdominal aorta is normal in caliber with atherosclerotic calcification. No enlarged abdominal or pelvic lymph nodes. Reproductive: Uterus and bilateral adnexa are unremarkable. Other: No intraperitoneal free air.  No abdominal wall hernia. Musculoskeletal: Age-indeterminate superior endplate compression deformity of the L5 vertebral body, new since the prior exam dated 04/30/2005. Degenerative disc changes at L4-L5 and L5-S1 with mild grade 1 anterolisthesis of L4 on L5. Diffuse osseous demineralization. No suspicious osseous lesion. IMPRESSION: 1. Sigmoid colonic diverticulosis with mild short segment wall thickening of the proximal sigmoid colon and trace adjacent free fluid, suggestive of early or mild acute diverticulitis. No evidence of perforation or abscess. 2. Age-indeterminate superior endplate compression deformity of the L5 vertebral body, new since the prior exam dated 04/30/2005. Degenerative disc disease of the lower lumbar spine. 3. Aortic Atherosclerosis (ICD10-I70.0).  Electronically Signed   By: Harrietta Sherry M.D.   On: 06/06/2023 21:42   DG Chest Port 1 View Result Date: 06/06/2023 CLINICAL DATA:  Chest  pain. EXAM: PORTABLE CHEST 1 VIEW COMPARISON:  01/06/2019. FINDINGS: The heart size and mediastinal contours are within normal limits. Aortic atherosclerosis. Both lungs are clear. No pleural effusion or pneumothorax. Cervical fusion hardware again noted. No acute osseous abnormality. IMPRESSION: No acute cardiopulmonary findings. Electronically Signed   By: Harrietta Sherry M.D.   On: 06/06/2023 18:24    Microbiology: Results for orders placed or performed in visit on 04/15/19  Urine Culture     Status: Abnormal   Collection Time: 04/15/19  2:09 PM   Specimen: Urine  Result Value Ref Range Status   MICRO NUMBER: [id number]  Final   SPECIMEN QUALITY: Adequate  Final   Sample Source URINE  Final   STATUS: FINAL  Final   ISOLATE 1: Escherichia coli (A)  Final    Comment: Greater than 100,000 CFU/mL of Escherichia coli      Susceptibility   Escherichia coli - URINE CULTURE, REFLEX    AMOX/CLAVULANIC 4 Sensitive     AMPICILLIN >=32 Resistant     AMPICILLIN/SULBACTAM 16 Intermediate     CEFAZOLIN * <=4 Not Reportable      * For infections other than uncomplicated UTIcaused by E. coli, K. pneumoniae or P. mirabilis:Cefazolin  is resistant if MIC > or = 8 mcg/mL.(Distinguishing susceptible versus intermediatefor isolates with MIC < or = 4 mcg/mL requiresadditional testing.)For uncomplicated UTI caused by E. coli,K. pneumoniae or P. mirabilis: Cefazolin  issusceptible if MIC <32 mcg/mL and predictssusceptible to the oral agents cefaclor, cefdinir,cefpodoxime, cefprozil, cefuroxime, cephalexinand loracarbef.    CEFEPIME <=1 Sensitive     CEFTRIAXONE <=1 Sensitive     CIPROFLOXACIN <=0.25 Sensitive     LEVOFLOXACIN <=0.12 Sensitive     ERTAPENEM <=0.5 Sensitive     GENTAMICIN >=16 Resistant     IMIPENEM <=0.25 Sensitive     NITROFURANTOIN <=16 Sensitive     PIP/TAZO <=4 Sensitive     TOBRAMYCIN 4 Sensitive     TRIMETH/SULFA* >=320 Resistant      * For infections other than uncomplicated UTIcaused  by E. coli, K. pneumoniae or P. mirabilis:Cefazolin  is resistant if MIC > or = 8 mcg/mL.(Distinguishing susceptible versus intermediatefor isolates with MIC < or = 4 mcg/mL requiresadditional testing.)For uncomplicated UTI caused by E. coli,K. pneumoniae or P. mirabilis: Cefazolin  issusceptible if MIC <32 mcg/mL and predictssusceptible to the oral agents cefaclor, cefdinir,cefpodoxime, cefprozil, cefuroxime, cephalexinand loracarbef.Legend:S = Susceptible  I = IntermediateR = Resistant  NS = Not susceptible* = Not tested  NR = Not reported**NN = See antimicrobic comments    Labs: CBC: Recent Labs  Lab 06/26/23 0650 06/27/23 0544 06/28/23 0622  WBC 7.6 11.1* 7.5  NEUTROABS 7.3  --  5.9  HGB 13.8 11.7* 12.1  HCT 41.5 35.3* 36.9  MCV 91.6 94.6 94.9  PLT 183 149* 143*   Basic Metabolic Panel: Recent Labs  Lab 06/26/23 0650 06/27/23 0544 06/28/23 0622  NA 142 138 139  K 2.9* 3.4* 3.4*  CL 106 109 105  CO2 23 22 24   GLUCOSE 131* 104* 106*  BUN 14 14 11   CREATININE 1.02* 0.68 0.80  CALCIUM  9.7 8.3* 8.8*  MG 1.8  --   --    Liver Function Tests: Recent Labs  Lab 06/26/23 0650 06/27/23 0544  AST 640* 116*  ALT 235* 116*  ALKPHOS 131* 91  BILITOT 1.3* 1.1  PROT 6.8 5.6*  ALBUMIN 4.1 3.2*   CBG: No results for input(s): GLUCAP in the last 168 hours.  Discharge time spent: greater than 30 minutes.  Signed: Shiloh Southern, DO [facility] 06/28/2023

## 2023-06-30 ENCOUNTER — Other Ambulatory Visit: Payer: Self-pay | Admitting: Family Medicine

## 2023-06-30 ENCOUNTER — Telehealth: Payer: Self-pay

## 2023-06-30 NOTE — Transitions of Care (Post Inpatient/ED Visit) (Unsigned)
   06/30/2023  Name: ADELLA MANOLIS MRN: [id number] DOB: [birth year]  Today's TOC FU Call Status: Today's TOC FU Call Status:: Unsuccessful Call (1st Attempt) Unsuccessful Call (1st Attempt) Date: 06/30/23  Attempted to reach the patient regarding the most recent Inpatient/ED visit.  Follow Up Plan: Additional outreach attempts will be made to reach the patient to complete the Transitions of Care (Post Inpatient/ED visit) call.   Signature Avelina Essex, CMA (AAMA)  [facility] [phone]

## 2023-07-02 ENCOUNTER — Other Ambulatory Visit (HOSPITAL_COMMUNITY): Payer: Self-pay

## 2023-07-08 DIAGNOSIS — M25562 Pain in left knee: Secondary | ICD-10-CM | POA: Diagnosis not present

## 2023-07-08 DIAGNOSIS — M25561 Pain in right knee: Secondary | ICD-10-CM | POA: Diagnosis not present

## 2023-07-17 ENCOUNTER — Encounter: Payer: Self-pay | Admitting: Family Medicine

## 2023-07-17 DIAGNOSIS — M25561 Pain in right knee: Secondary | ICD-10-CM | POA: Diagnosis not present

## 2023-07-17 DIAGNOSIS — M25562 Pain in left knee: Secondary | ICD-10-CM | POA: Diagnosis not present

## 2023-07-18 ENCOUNTER — Ambulatory Visit: Payer: Self-pay | Admitting: Family Medicine

## 2023-07-18 ENCOUNTER — Other Ambulatory Visit (INDEPENDENT_AMBULATORY_CARE_PROVIDER_SITE_OTHER)

## 2023-07-18 DIAGNOSIS — E538 Deficiency of other specified B group vitamins: Secondary | ICD-10-CM

## 2023-07-18 DIAGNOSIS — E785 Hyperlipidemia, unspecified: Secondary | ICD-10-CM | POA: Diagnosis not present

## 2023-07-18 LAB — LIPID PANEL
Cholesterol: 116 mg/dL (ref 0–200)
HDL: 53.5 mg/dL (ref >39.00)
LDL Cholesterol: 39 mg/dL (ref 0–99)
NonHDL: 62.44
Total CHOL/HDL Ratio: 2
Triglycerides: 118 mg/dL (ref 0.0–149.0)
VLDL: 23.6 mg/dL (ref 0.0–40.0)

## 2023-07-18 LAB — CBC WITH DIFFERENTIAL/PLATELET
Basophils Absolute: 0 K/uL (ref 0.0–0.1)
Basophils Relative: 0.6 % (ref 0.0–3.0)
Eosinophils Absolute: 0.2 K/uL (ref 0.0–0.7)
Eosinophils Relative: 4.4 % (ref 0.0–5.0)
HCT: 36.4 % (ref 36.0–46.0)
Hemoglobin: 12.7 g/dL (ref 12.0–15.0)
Lymphocytes Relative: 20.6 % (ref 12.0–46.0)
Lymphs Abs: 1.1 K/uL (ref 0.7–4.0)
MCHC: 35 g/dL (ref 30.0–36.0)
MCV: 87.7 fl (ref 78.0–100.0)
Monocytes Absolute: 0.5 K/uL (ref 0.1–1.0)
Monocytes Relative: 9 % (ref 3.0–12.0)
Neutro Abs: 3.6 K/uL (ref 1.4–7.7)
Neutrophils Relative %: 65.4 % (ref 43.0–77.0)
Platelets: 219 K/uL (ref 150.0–400.0)
RBC: 4.15 Mil/uL (ref 3.87–5.11)
RDW: 13.4 % (ref 11.5–15.5)
WBC: 5.5 K/uL (ref 4.0–10.5)

## 2023-07-18 LAB — COMPREHENSIVE METABOLIC PANEL WITH GFR
ALT: 11 U/L (ref 0–35)
AST: 19 U/L (ref 0–37)
Albumin: 4.3 g/dL (ref 3.5–5.2)
Alkaline Phosphatase: 56 U/L (ref 39–117)
BUN: 9 mg/dL (ref 6–23)
CO2: 29 meq/L (ref 19–32)
Calcium: 9.1 mg/dL (ref 8.4–10.5)
Chloride: 105 meq/L (ref 96–112)
Creatinine, Ser: 0.95 mg/dL (ref 0.40–1.20)
GFR: 57.28 mL/min — ABNORMAL LOW (ref >60.00)
Glucose, Bld: 123 mg/dL — ABNORMAL HIGH (ref 70–99)
Potassium: 3.3 meq/L — ABNORMAL LOW (ref 3.5–5.1)
Sodium: 141 meq/L (ref 135–145)
Total Bilirubin: 0.7 mg/dL (ref 0.2–1.2)
Total Protein: 6.4 g/dL (ref 6.0–8.3)

## 2023-07-18 LAB — TSH: TSH: 1.68 u[IU]/mL (ref 0.35–5.50)

## 2023-07-18 LAB — VITAMIN B12: Vitamin B-12: 262 pg/mL (ref 211–911)

## 2023-07-18 MED ORDER — POTASSIUM CHLORIDE CRYS ER 10 MEQ PO TBCR: 10.0000 meq | EXTENDED_RELEASE_TABLET | Freq: Two times a day (BID) | ORAL | 0 refills | Status: DC

## 2023-07-21 MED ORDER — POTASSIUM CHLORIDE 20 MEQ/15ML (10%) PO SOLN: 10.0000 meq | Freq: Two times a day (BID) | ORAL | 0 refills | Status: DC

## 2023-07-23 DIAGNOSIS — M25562 Pain in left knee: Secondary | ICD-10-CM | POA: Diagnosis not present

## 2023-07-23 DIAGNOSIS — M25561 Pain in right knee: Secondary | ICD-10-CM | POA: Diagnosis not present

## 2023-07-24 ENCOUNTER — Encounter: Payer: Self-pay | Admitting: Family Medicine

## 2023-07-24 ENCOUNTER — Ambulatory Visit (INDEPENDENT_AMBULATORY_CARE_PROVIDER_SITE_OTHER): Admitting: Family Medicine

## 2023-07-24 VITALS — BP 102/64 | HR 68 | Temp 98.1°F | Ht 64.0 in | Wt 140.0 lb

## 2023-07-24 DIAGNOSIS — N183 Chronic kidney disease, stage 3 unspecified: Secondary | ICD-10-CM

## 2023-07-24 DIAGNOSIS — E538 Deficiency of other specified B group vitamins: Secondary | ICD-10-CM

## 2023-07-24 DIAGNOSIS — I10 Essential (primary) hypertension: Secondary | ICD-10-CM

## 2023-07-24 DIAGNOSIS — E785 Hyperlipidemia, unspecified: Secondary | ICD-10-CM

## 2023-07-24 DIAGNOSIS — Z8719 Personal history of other diseases of the digestive system: Secondary | ICD-10-CM | POA: Diagnosis not present

## 2023-07-24 NOTE — Patient Instructions (Addendum)
 Please  call [phone] to schedule your appointment with Cardiology.  Repeat labs next visit for potassium  Could try magnesium glycinate at night to see if helps with sleep  Recommended follow up: Return in about 1 month (around 08/24/2023) for followup or sooner if needed.Schedule b4 you leave.

## 2023-07-24 NOTE — Progress Notes (Signed)
 Phone [phone] In person visit   Subjective:   Crystal Carroll is a 78 y.o. 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 02/24/2019    Priority: High   Hyperparathyroidism, primary (HCC) 07/17/2018    Priority: High   History of lacunar cerebrovascular accident (CVA) 06/18/2018    Priority: High   Mild aortic stenosis 07/19/2019    Priority: Medium    Osteoporosis 03/13/2018    Priority: Medium    DDD (degenerative disc disease), lumbar 03/05/2018    Priority: Medium    Aortic atherosclerosis (HCC) 06/12/2017    Priority: Medium    Palpitations 05/12/2017    Priority: Medium    Vitamin B12 deficiency 05/12/2017    Priority: Medium    Cervical radiculopathy 04/03/2016    Priority: Medium    Essential tremor 04/18/2015    Priority: Medium    CKD (chronic kidney disease), stage III (HCC) 10/15/2013    Priority: Medium    Former smoker 12/24/2006    Priority: Medium    Hyperlipidemia 05/26/2006    Priority: Medium    GAD (generalized anxiety disorder) 05/26/2006    Priority: Medium    Essential hypertension 05/26/2006    Priority: Medium    Patellofemoral arthritis 03/05/2018    Priority: Low   Unstable gait 01/20/2018    Priority: Low   Benign neoplasm of colon 06/14/2014    Priority: Low   H/O cold sores 10/15/2013    Priority: Low   GERD 05/26/2006    Priority: Low   Acute pancreatitis 06/26/2023    Medications- reviewed and updated Current Outpatient Medications  Medication Sig Dispense Refill   acetaminophen  (TYLENOL ) 500 MG tablet Take 1,000 mg by  mouth at bedtime.     ascorbic acid (VITAMIN C) 500 MG tablet Take 500 mg by mouth in the morning.     aspirin EC 81 MG tablet Take 81 mg by mouth at bedtime.      buPROPion  (WELLBUTRIN  XL) 150 MG 24 hr tablet Take 1 tablet (150 mg total) by mouth daily. 30 tablet 5   busPIRone  (BUSPAR ) 10 MG tablet Take 1 tablet (10 mg total) by mouth daily. 30 tablet 5   Cholecalciferol (VITAMIN D3) 50 MCG (2000 UT) TABS Take 2,000 Units by mouth in the morning.     citalopram  (CELEXA ) 20 MG tablet TAKE ONE TABLET BY MOUTH ONCE DAILY 90 tablet 3   ezetimibe  (ZETIA ) 10 MG tablet Take 1 tablet (10 mg total) by mouth daily. 90 tablet 3   pantoprazole  (PROTONIX ) 40 MG tablet Take 1 tablet (40 mg total) by mouth daily. 30 tablet 3   potassium chloride  20 MEQ/15ML (10%) SOLN Take 7.5 mLs (10 mEq total) by mouth 2 (two) times daily. 473 mL 0   rosuvastatin  (CRESTOR ) 40 MG tablet TAKE ONE TABLET BY MOUTH DAILY 90 tablet 3   No current facility-administered medications for this visit.     Objective:  BP 102/64   Pulse 68   Temp 98.1 F (36.7 C)   Ht 5' 4 (1.626 m)   Wt 140 lb (63.5 kg)  LMP  (LMP Unknown)   SpO2 95%   BMI 24.03 kg/m  Gen: NAD, resting comfortably CV: RRR no murmurs rubs or gallops Lungs: CTAB no crackles, wheeze, rhonchi Abdomen: soft/nontender/nondistended/normal bowel sounds. No rebound or guarding.  Ext: trace edema Skin: warm, dry Neuro: walks with cane.     Assessment and Plan   # Hospital follow-up for pancreatitis S: Patient was admitted to the hospital on 06/26/2023 and discharged 2 days later on 06/28/2023.  She had increased her alcohol consumption and this likely contributed to the episode with up to 2-4 beverages per day- still spritzer.  Right upper quadrant ultrasound with normal gallbladder and no biliary ductal dilation. She is now alcohol free since 19th of June- congratulated her on this progress.  -Had LFT elevations but these downtrended during hospitalization.   Her statin was held in hospitalization   Today reports no further abdominal pain- upper or lower (prior diverticulitis). Appetite has been low since illness.     Latest Ref Rng & Units 07/18/2023    2:20 PM 06/27/2023    5:44 AM 06/26/2023    6:50 AM  Hepatic Function  Total Protein 6.0 - 8.3 g/dL 6.4  5.6  6.8   Albumin 3.5 - 5.2 g/dL 4.3  3.2  4.1   AST 0 - 37 U/L 19  116  640   ALT 0 - 35 U/L 11  116  235   Alk Phosphatase 39 - 117 U/L 56  91  131   Total Bilirubin 0.2 - 1.2 mg/dL 0.7  1.1  1.3    A/P: abdominal ain free- pancreatitis has resolved- thankfully she has remained alcohol free which should decrease likelihood of recurrence -LFTs had been elevated but have now normalized- continue to monitor  -did have slight drop in potassium but that has normalized.    Did have hypokalemia - is on current supplement and we can recheck next visit- was only slightly low at 3.3.   # atrial fibrillation ? And prior atypical chest pain S: Atrial fibrillation has been noted in the emergency department on May 30-plan with outpatient cardiology referral-we referred her at her June visit but she is yet to hear - only one episode of palpitations since last visit  -we had also wondered if reflux could have been causing chest pain - has not had any pain since we increased pantoprazole  back to 40 mg A/P: atypical chest pain- thankfully resolved but with history of possible A fib on EKG in Emergency Department - she is going of the call to schedule follow up with cardiology to discuss this further- we are holding off on eliquis or similar at this time-she has had 1 episode of palpitations since last visit so these have been overall minimal  # Unstable gait-has been working with physical therapy for her knees and it seems to be helping her gait some.   # B12 deficiency S: Current treatment/medication (oral vs. IM): Taking B12 daily Lab Results  Component Value Date   VITAMINB12 262 07/18/2023  A/P:  Relative B12 deficiency-but taking B12 daily-could potentially help with gait   #Hyperlipidemia/aortic atherosclerosis/ history of stroke- on aspirin 81 mg with LDL goal under 70 S: Compliant with rosuvastatin  40 mg and Zetia  10 mg.   -Aortic atherosclerosis noted CT 06/26/2017 for lung cancer screening.   Lab Results  Component Value Date   CHOL 116 07/18/2023   HDL 53.50 07/18/2023   LDLCALC 39 07/18/2023   LDLDIRECT 106 (H) 07/19/2016  TRIG 118.0 07/18/2023   CHOLHDL 2 07/18/2023   A/P: Most recent lipids are well-controlled even with stroke history-continue current medication with rosuvastatin  and Zetia  as well as aspirin  #Hypertension/CKD stage III S: Patient remains off lisinopril  fortunately blood pressure remains controlled  -GFR has been stable largely in the 40s lately- in the past had been in the 30s A/P: Blood pressure well-controlled at medicine-most recent GFR just under 60  Recommended follow up: With recent hospitalizations she request close follow-up Return in about 1 month (around 08/24/2023) for followup or sooner if needed.Schedule b4 you leave. Future Appointments  Date Time Provider Department Center  08/26/2023 10:20 AM [place], PA-C [facility]  09/04/2023  1:00 PM Katrinka Garnette KIDD, MD [facility]    Lab/Order associations:   ICD-10-CM   1. History of pancreatitis  Z87.19     2. Stage 3 chronic kidney disease, unspecified whether stage 3a or 3b CKD (HCC)  N18.30     3. Essential hypertension  I10     4. Hyperlipidemia, unspecified hyperlipidemia type  E78.5     5. Vitamin B12 deficiency  E53.8       No orders of the defined types were placed in this encounter.   Return precautions advised.  Garnette Katrinka, MD

## 2023-07-28 NOTE — Progress Notes (Signed)
 Cardiology Office Note:    Date:  07/29/2023  ID:  Crystal Carroll, DOB 1945/07/16, MRN [id number] PCP: Katrinka Garnette KIDD, MD  [facility] Providers Cardiologist:  Lonni LITTIE Nanas, MD Cardiology APP:  Rana Lum LITTIE, NP       Patient Profile:       Chief Complaint: 1 year follow-up History of Present Illness:  Crystal Carroll is a 78 y.o. female with visit-pertinent history of hypertension, hyperlipidemia, hyperparathyroidism, CKD stage III, mild aortic stenosis  She was initially referred to cardiology on 11/11/2018 for evaluation of palpitations.  She wore a 30-day cardiac monitor which showed predominantly sinus rhythm and most other triggered events were sinus rhythm.  However there was occasional episodes of accelerated junctional rhythm and 2 of her triggered events involve the junctional rhythm.  During her initial visit it was discussed that she likely had some degree of sick sinus syndrome, there was discussion regarding an exercise treadmill test to evaluate for chronotropic incompetence however she declined the treadmill test as she did not think that she could walk on a treadmill due to her knee pain.  TTE during that time showed normal LV and RV function, mild aortic stenosis, small PFO.  Repeat echocardiogram on 02/2020 showed mild aortic stenosis with mean gradient of 12 mmHg and normal biventricular function.  She was last seen in office on 07/02/2022 by Lamarr, NP.  Routine echocardiogram was ordered for surveillance on 07/30/2022 showing normal biventricular function and no evidence of aortic stenosis.  She was seen in the ED on 06/06/2023 with abdominal pain and diagnosed with uncomplicated diverticulitis and started on antibiotics.  Interestingly the EKG read atrial fibrillation however this was not addressed by the ED physician.  She was recently admitted to the hospital on 06/26/2023 through 06/28/2023 for acute pancreatitis.  Most likely etiology seems to be  alcohol.  She remained in sinus rhythm throughout her admission.  Discussed the use of AI scribe software for clinical note transcription with the patient, who gave verbal consent to proceed.  History of Present Illness Crystal Carroll is a 78 year old female who presents for 1 year follow-up and evaluation for possible atrial fibrillation.  Today she is doing well overall.  She is without any acute cardiovascular concerns or complaints at this time.  She denies any anginal symptoms.  She was seen in the emergency room on May 30th, where an EKG indicated possible atrial fibrillation, though she was not informed at the time. She has no prior history of atrial fibrillation and currently experiences no palpitations, chest fluttering, or chest pain.  She has an Scientist, physiological and denies any alerts for abnormal rhythms.  She experienced elevated liver enzymes during a recent hospital stay, which have since improved. She continues on Crestor  and Zetia  for cholesterol management, with an LDL level of 39. She has abstained from alcohol since June 19th.  She denies shortness of breath, orthopnea, PND, leg swelling, syncope, presyncope.   Review of systems:  Please see the history of present illness. All other systems are reviewed and otherwise negative.      Studies Reviewed:    EKG Interpretation Date/Time:  Tuesday July 29 2023 08:08:41 EDT Ventricular Rate:  77 PR Interval:  148 QRS Duration:  76 QT Interval:  384 QTC Calculation: 434 R Axis:   21  Text Interpretation: Normal sinus rhythm Nonspecific ST changes When compared with ECG of 26-Jun-2023 09:15, PREVIOUS ECG IS PRESENT Reconfirmed by Rana Lum [phone])  on 07/29/2023 12:11:48 PM    Echocardiogram 07/30/2022 Left ventricular ejection fraction, by estimation, is 60 to 65%. The left ventricle has normal function. The left ventricle has no regional wall motion abnormalities. Left ventricular diastolic parameters were normal. 1. 2.  Right ventricular systolic function is normal. The right ventricular size is normal. The mitral valve is normal in structure. No evidence of mitral valve regurgitation. No evidence of mitral stenosis. 3. The aortic valve is normal in structure. There is mild calcification of the aortic valve. Aortic valve regurgitation is not visualized. No aortic stenosis is present. Aortic valve mean gradient measures 10 mmHg. Aortic valve Vmax measures 2.04 m/s. 4. The inferior vena cava is normal in size with greater than 50% respiratory variability, suggesting right atrial pressure of 3 mmHg.  Echocardiogram 02/14/2020 1. The aortic valve is tricuspid. There is mild calcification of the  aortic valve. There is mild thickening of the aortic valve. Aortic valve  regurgitation is not visualized. Aortic valve area, by VTI measures 1.98  cm. Aortic valve mean gradient  measures 12.0 mmHg. Aortic valve Vmax measures 2.41 m/s.   2. Left ventricular ejection fraction, by estimation, is 60 to 65%. Left  ventricular ejection fraction by 3D volume is 62 %. The left ventricle has  normal function. The left ventricle has no regional wall motion  abnormalities. Left ventricular diastolic   parameters were normal. The average left ventricular global longitudinal  strain is -20.6 %. The global longitudinal strain is normal.   3. Right ventricular systolic function is normal. The right ventricular  size is normal. Tricuspid regurgitation signal is inadequate for assessing  PA pressure.   4. The mitral valve is grossly normal. No evidence of mitral valve  regurgitation. No evidence of mitral stenosis.   5. The inferior vena cava is normal in size with greater than 50%  respiratory variability, suggesting right atrial pressure of 3 mmHg.   Cardiac monitor 06/18/2018 Predominant rhythm is sinus No atrial fibrillation No pauses There are occasional episodes of junction rhythm (HR in the 60-70's) with underlying P wave  activity (Isorhythmic dissociation) which are associated with the sensation of rapid beats or flutter Would recommend exercise treadmill test to demonstrate adequate chronotropic competance and would avoid AV nodal blocking agents (beta blockers, diltiazem for instance) Risk Assessment/Calculations:              Physical Exam:   VS:  BP 124/60   Pulse 74   Ht 5' 4 (1.626 m)   Wt 138 lb 6.4 oz (62.8 kg)   LMP  (LMP Unknown)   SpO2 95%   BMI 23.76 kg/m    Wt Readings from Last 3 Encounters:  07/29/23 138 lb 6.4 oz (62.8 kg)  07/24/23 140 lb (63.5 kg)  06/26/23 145 lb (65.8 kg)    GEN: Well nourished, well developed in no acute distress NECK: No JVD; No carotid bruits CARDIAC: RRR, no murmurs, rubs, gallops RESPIRATORY:  Clear to auscultation without rales, wheezing or rhonchi  ABDOMEN: Soft, non-tender, non-distended EXTREMITIES:  No edema; No acute deformity      Assessment and Plan:  Possible atrial fibrillation EKG 06/06/2023 while in the ED in the setting of diverticulitis read atrial fibrillation however this was not addressed during the ED visit nor repeat EKG was obtained.  EKG had a lot of artifact so unclear if this was true atrial fibrillation.  The rhythm seemed to be regular - Today she denies any symptoms concerning for atrial fibrillation -  EKG today shows normal sinus rhythm - Plan for 30-day monitor to evaluate for atrial fibrillation.  Will hold off on OAC at this time - Set up patient's Apple Watch in clinic today to monitor for arrhythmia and educated on how to take EKG  Aortic valve stenosis Echocardiogram 02/2020 showed mild aortic stenosis with mean gradient of 12 mmHg Repeat echocardiogram 07/2022 showed no evidence of aortic stenosis with mean gradient 10 mmHg - Today she remains asymptomatic - There is no indication for intervention at this time  Hyperlipidemia LDL 39 on 07/2023 and well-controlled - Statin recently held in setting of abnormal LFTs  during admission for pancreatitis, however this resolved on most recent labs on 07/18/2023 - Continue rosuvastatin  40 mg daily and ezetimibe  10 mg daily - Heart healthy diet encouraged  Junctional rhythm Occasional episodes of accelerated junctional rhythm noted on 30-day cardiac monitor in 2020.  It was suspected likely has some degree of sick sinus syndrome.  There was discussion of getting exercise treadmill test to evaluate for chronotropic incompetence however at that time patient declined treadmill stress test as she did not think she could walk on a treadmill due to chronic knee pain. - She has no evidence of symptomatic bradycardia at this time, no indication for pacemaker at this time - Recommend avoidance of AV nodal blocking agents  Hypertension Blood pressure today is 124/60 and well-controlled - Continue current antihypertensive regimen   Hypokalemia Potassium 3.3 on 07/18/2023 - Repeat BMET today      Dispo:  Return in about 4 months (around 11/29/2023).  Signed, Lum LITTIE Louis, NP

## 2023-07-29 ENCOUNTER — Encounter: Payer: Self-pay | Admitting: Emergency Medicine

## 2023-07-29 ENCOUNTER — Ambulatory Visit: Attending: Emergency Medicine | Admitting: Emergency Medicine

## 2023-07-29 ENCOUNTER — Encounter: Payer: Self-pay | Admitting: *Deleted

## 2023-07-29 VITALS — BP 124/60 | HR 74 | Ht 64.0 in | Wt 138.4 lb

## 2023-07-29 DIAGNOSIS — I350 Nonrheumatic aortic (valve) stenosis: Secondary | ICD-10-CM | POA: Diagnosis not present

## 2023-07-29 DIAGNOSIS — I4891 Unspecified atrial fibrillation: Secondary | ICD-10-CM | POA: Diagnosis not present

## 2023-07-29 DIAGNOSIS — E876 Hypokalemia: Secondary | ICD-10-CM

## 2023-07-29 DIAGNOSIS — I498 Other specified cardiac arrhythmias: Secondary | ICD-10-CM

## 2023-07-29 DIAGNOSIS — R079 Chest pain, unspecified: Secondary | ICD-10-CM | POA: Diagnosis not present

## 2023-07-29 DIAGNOSIS — E785 Hyperlipidemia, unspecified: Secondary | ICD-10-CM | POA: Diagnosis not present

## 2023-07-29 DIAGNOSIS — M25562 Pain in left knee: Secondary | ICD-10-CM | POA: Diagnosis not present

## 2023-07-29 DIAGNOSIS — I10 Essential (primary) hypertension: Secondary | ICD-10-CM | POA: Diagnosis not present

## 2023-07-29 DIAGNOSIS — E7800 Pure hypercholesterolemia, unspecified: Secondary | ICD-10-CM | POA: Diagnosis not present

## 2023-07-29 DIAGNOSIS — M25561 Pain in right knee: Secondary | ICD-10-CM | POA: Diagnosis not present

## 2023-07-29 LAB — BASIC METABOLIC PANEL WITH GFR
BUN/Creatinine Ratio: 10 — ABNORMAL LOW (ref 12–28)
BUN: 11 mg/dL (ref 8–27)
CO2: 21 mmol/L (ref 20–29)
Calcium: 10 mg/dL (ref 8.7–10.3)
Chloride: 105 mmol/L (ref 96–106)
Creatinine, Ser: 1.14 mg/dL — ABNORMAL HIGH (ref 0.57–1.00)
Glucose: 74 mg/dL (ref 70–99)
Potassium: 4 mmol/L (ref 3.5–5.2)
Sodium: 144 mmol/L (ref 134–144)
eGFR: 49 mL/min/1.73 — ABNORMAL LOW (ref >59)

## 2023-07-29 NOTE — Patient Instructions (Addendum)
 Medication Instructions:  NO CHANGES   Lab Work: BMET   Testing/Procedures: [employer] Cardiac Event Monitor Instructions  Your physician has requested you wear your cardiac event monitor for __30___ days, (1-30). [employer] may call or text to confirm a shipping address. The monitor will be sent to a land address via UPS. [employer] will not ship a monitor to a PO BOX. It typically takes 3-5 days to receive your monitor after it has been enrolled. [employer] will assist with USPS tracking if your package is delayed. The telephone number for [employer] is [phone]. Once you have received your monitor, please review the enclosed instructions. Instruction tutorials can also be viewed under help and settings on the enclosed cell phone. Your monitor has already been registered assigning a specific monitor serial # to you.  Billing and Self Pay Discount Information  [employer] has been provided the insurance information we had on file for you.  If your insurance has been updated, please call [employer] at [phone] to provide them with your updated insurance information.   [employer] offers a discounted Self Pay option for patients who have insurance that does not cover their cardiac event monitor or patients without insurance.  The discounted cost of a Self Pay Cardiac Event Monitor would be $225.00 , if the patient contacts [employer] at [phone] within 7 days of applying the monitor to make payment arrangements.  If the patient does not contact [employer] within 7 days of applying the monitor, the cost of the cardiac event monitor will be $350.00.  Applying the monitor  Remove cell phone from case and turn it on. The cell phone works as IT consultant and needs to be within [employer] of you at all times. The cell phone will need to be charged on a daily basis. We recommend you plug the cell phone into the enclosed charger at your bedside table every night.  Monitor  batteries: You will receive two monitor batteries labelled #1 and #2. These are your recorders. Plug battery #2 onto the second connection on the enclosed charger. Keep one battery on the charger at all times. This will keep the monitor battery deactivated. It will also keep it fully charged for when you need to switch your monitor batteries. A small light will be blinking on the battery emblem when it is charging. The light on the battery emblem will remain on when the battery is fully charged.  Open package of a Monitor strip. Insert battery #1 into black hood on strip and gently squeeze monitor battery onto connection as indicated in instruction booklet. Set aside while preparing skin.  Choose location for your strip, vertical or horizontal, as indicated in the instruction booklet. Shave to remove all hair from location. There cannot be any lotions, oils, powders, or colognes on skin where monitor is to be applied. Wipe skin clean with enclosed Saline wipe. Dry skin completely.  Peel paper labeled #1 off the back of the Monitor strip exposing the adhesive. Place the monitor on the chest in the vertical or horizontal position shown in the instruction booklet. One arrow on the monitor strip must be pointing upward. Carefully remove paper labeled #2, attaching remainder of strip to your skin. Try not to create any folds or wrinkles in the strip as you apply it.  Firmly press and release the circle in the center of the monitor battery. You will hear a small beep. This is turning the monitor battery on. The heart emblem on the monitor battery will light up  every 5 seconds if the monitor battery in turned on and connected to the patient securely. Do not push and hold the circle down as this turns the monitor battery off. The cell phone will locate the monitor battery. A screen will appear on the cell phone checking the connection of your monitor strip. This may read poor connection initially but  change to good connection within the next minute. Once your monitor accepts the connection you will hear a series of 3 beeps followed by a climbing crescendo of beeps. A screen will appear on the cell phone showing the two monitor strip placement options. Touch the picture that demonstrates where you applied the monitor strip.  Your monitor strip and battery are waterproof. You are able to shower, bathe, or swim with the monitor on. They just ask you do not submerge deeper than 3 feet underwater. We recommend removing the monitor if you are swimming in a lake, river, or ocean.  Your monitor battery will need to be switched to a fully charged monitor battery approximately once a week. The cell phone will alert you of an action which needs to be made.  On the cell phone, tap for details to reveal connection status, monitor battery status, and cell phone battery status. The green dots indicates your monitor is in good status. A red dot indicates there is something that needs your attention.  To record a symptom, click the circle on the monitor battery. In 30-60 seconds a list of symptoms will appear on the cell phone. Select your symptom and tap save. Your monitor will record a sustained or significant arrhythmia regardless of you clicking the button. Some patients do not feel the heart rhythm irregularities. [employer] will notify us  of any serious or critical events.  Refer to instruction booklet for instructions on switching batteries, changing strips, the Do not disturb or Pause features, or any additional questions.  Call [employer] at [phone], to confirm your monitor is transmitting and record your baseline. They will answer any questions you may have regarding the monitor instructions at that time.  Returning the monitor to [employer]  Place all equipment back into blue box. Peel off strip of paper to expose adhesive and close box securely. There is a prepaid UPS shipping  label on this box. Drop in a UPS drop box, or at a UPS facility like [employer]. You may also contact [employer] to arrange UPS to pick up monitor package at your home.   Follow-Up: At [facility], you and your health needs are our priority.  As part of our continuing mission to provide you with exceptional heart care, our providers are all part of one team.  This team includes your primary Cardiologist (physician) and Advanced Practice Providers or APPs (Physician Assistants and Nurse Practitioners) who all work together to provide you with the care you need, when you need it.  Your next appointment:   4 MONTHS  Provider:   Lonni LITTIE Nanas, MD OR Lum Louis, [place]

## 2023-07-29 NOTE — Progress Notes (Unsigned)
 Patient enrolled for [employer] [facility] [employer] to ship a 30 day cardiac event monitor to her address on file. Hydrocolloid strips requested. Dr. Kate to read.

## 2023-07-30 DIAGNOSIS — Z1231 Encounter for screening mammogram for malignant neoplasm of breast: Secondary | ICD-10-CM | POA: Diagnosis not present

## 2023-07-30 LAB — HM MAMMOGRAPHY

## 2023-07-31 ENCOUNTER — Ambulatory Visit: Payer: Self-pay | Admitting: Emergency Medicine

## 2023-07-31 DIAGNOSIS — M25562 Pain in left knee: Secondary | ICD-10-CM | POA: Diagnosis not present

## 2023-07-31 DIAGNOSIS — M25561 Pain in right knee: Secondary | ICD-10-CM | POA: Diagnosis not present

## 2023-08-04 ENCOUNTER — Telehealth: Payer: Self-pay | Admitting: Cardiology

## 2023-08-04 NOTE — Telephone Encounter (Signed)
 Pt called in and stated received her heart monitor in the mail and would like to see if she can come and get some help putting it on?    Best number [phone]

## 2023-08-05 DIAGNOSIS — M25562 Pain in left knee: Secondary | ICD-10-CM | POA: Diagnosis not present

## 2023-08-05 DIAGNOSIS — M25561 Pain in right knee: Secondary | ICD-10-CM | POA: Diagnosis not present

## 2023-08-07 DIAGNOSIS — M25561 Pain in right knee: Secondary | ICD-10-CM | POA: Diagnosis not present

## 2023-08-07 DIAGNOSIS — M25562 Pain in left knee: Secondary | ICD-10-CM | POA: Diagnosis not present

## 2023-08-09 DIAGNOSIS — I4891 Unspecified atrial fibrillation: Secondary | ICD-10-CM | POA: Diagnosis not present

## 2023-08-18 ENCOUNTER — Other Ambulatory Visit: Payer: Self-pay | Admitting: Family Medicine

## 2023-08-25 NOTE — Progress Notes (Unsigned)
 Ellouise Console, PA-C [address] [place], [place]  [address] Phone: [phone]   Primary Care Physician: Katrinka Garnette KIDD, MD  Primary Gastroenterologist:  Ellouise Console, PA-C / Glendia Holt, MD   Chief Complaint: Hospital follow-up alcohol induced pancreatitis       HPI:   Crystal Carroll is a 78 y.o. female presents for hospital follow-up of pancreatitis and diverticulitis.  Hospitalized 06/26/2023 until 06/28/2023 for acute pancreatitis, thought secondary to alcohol.  She consumes 2 to 4 ounces of alcohol daily.  no pancreatic cyst or necrosis or other complications.  Mildly elevated triglyceride 248, not because of pancreatitis.  RUQ ultrasound showed normal gallbladder with no gallstones.  Normal CBD.  No medications to cause pancreatitis.  She was treated with IV fluids, liquid diet, and her condition improved.  History of GERD, controlled on pantoprazole  40 Mg daily.  Hospitalized 06/06/2023 for mild early diverticulitis treated with Augmentin .  Current symptoms: Current alcohol use:  02/2022 last colonoscopy by Dr. Holt: 5 small tubular adenomas and 1 hyperplastic polyp removed.  Nonbleeding internal hemorrhoids.  Severe sigmoid and descending colon diverticulosis with narrowing of the colon.  Good prep.  3-year repeat colonoscopy is optional, pending health status.   Current Outpatient Medications  Medication Sig Dispense Refill   acetaminophen  (TYLENOL ) 500 MG tablet Take 1,000 mg by mouth at bedtime.     ascorbic acid (VITAMIN C) 500 MG tablet Take 500 mg by mouth in the morning.     aspirin EC 81 MG tablet Take 81 mg by mouth at bedtime.      buPROPion  (WELLBUTRIN  XL) 150 MG 24 hr tablet Take 1 tablet (150 mg total) by mouth daily. 30 tablet 5   busPIRone  (BUSPAR ) 10 MG tablet Take 1 tablet (10 mg total) by mouth daily. 30 tablet 5   Cholecalciferol (VITAMIN D3) 50 MCG (2000 UT) TABS Take 2,000 Units by mouth in the morning.     citalopram  (CELEXA ) 20  MG tablet TAKE ONE TABLET BY MOUTH ONCE DAILY 90 tablet 3   ezetimibe  (ZETIA ) 10 MG tablet Take 1 tablet (10 mg total) by mouth daily. 90 tablet 3   pantoprazole  (PROTONIX ) 40 MG tablet Take 1 tablet (40 mg total) by mouth daily. 30 tablet 3   potassium chloride  20 MEQ/15ML (10%) SOLN Take 7.5 mLs (10 mEq total) by mouth 2 (two) times daily. 473 mL 0   rosuvastatin  (CRESTOR ) 40 MG tablet TAKE ONE TABLET BY MOUTH DAILY 90 tablet 3   No current facility-administered medications for this visit.    Allergies as of 08/26/2023 - Review Complete 07/29/2023  Allergen Reaction Noted   Codeine Nausea Only 04/15/2017   Sulfamethoxazole Nausea Only 07/10/2006    Past Medical History:  Diagnosis Date   Anxiety    Aortic atherosclerosis (HCC)    Aortic valvar stenosis 11/2018   Mild, noted on ECHO   Arthritis    cervical neck   CKD (chronic kidney disease), stage III (HCC)    Stable   GERD (gastroesophageal reflux disease)    History of TIA (transient ischemic attack)    per patient, didn't know about it   Hyperlipidemia    Hyperparathyroidism (HCC)    Hypertension    History of    Junctional rhythm    Osteoporosis    Palpitations    Thyroid  nodule    2.0 cm solid nodule in the inferior left thyroid  lobe     Past Surgical History:  Procedure  Laterality Date   CERVICAL LAMINECTOMY  2004   Chyrl Budge   COLONOSCOPY  05/31/2014   Debrah   COLONOSCOPY WITH PROPOFOL  N/A 02/14/2022   Procedure: COLONOSCOPY WITH PROPOFOL ;  Surgeon: Stacia Glendia BRAVO, MD;  Location: [facility] ENDOSCOPY;  Service: Gastroenterology;  Laterality: N/A;   ESOPHAGOGASTRODUODENOSCOPY     PARATHYROIDECTOMY Left 01/13/2019   Procedure: LEFT PARATHYROIDECTOMY;  Surgeon: Eletha Boas, MD;  Location: [facility] ORS;  Service: General;  Laterality: Left;   POLYPECTOMY  02/14/2022   Procedure: POLYPECTOMY;  Surgeon: Stacia Glendia BRAVO, MD;  Location[facility] ENDOSCOPY;  Service: Gastroenterology;;   THYROID  LOBECTOMY Left 01/13/2019    Procedure: LEFT THYROID  LOBECTOMY;  Surgeon: Eletha Boas, MD;  Location: [facility] ORS;  Service: General;  Laterality: Left;    Review of Systems:    All systems reviewed and negative except where noted in HPI.    Physical Exam:  LMP  (LMP Unknown)  No LMP recorded (lmp unknown). Patient is postmenopausal.  General: Well-nourished, well-developed in no acute distress.  Lungs: Clear to auscultation bilaterally. Non-labored. Heart: Regular rate and rhythm, no murmurs rubs or gallops.  Abdomen: Bowel sounds are normal; Abdomen is Soft; No hepatosplenomegaly, masses or hernias;  No Abdominal Tenderness; No guarding or rebound tenderness. Neuro: Alert and oriented x 3.  Grossly intact.  Psych: Alert and cooperative, normal mood and affect.   Imaging Studies: No results found.  Labs: CBC    Component Value Date/Time   WBC 5.5 07/18/2023 1420   RBC 4.15 07/18/2023 1420   HGB 12.7 07/18/2023 1420   HCT 36.4 07/18/2023 1420   PLT 219.0 07/18/2023 1420   MCV 87.7 07/18/2023 1420   MCH 31.1 06/28/2023 0622   MCHC 35.0 07/18/2023 1420   RDW 13.4 07/18/2023 1420   LYMPHSABS 1.1 07/18/2023 1420   MONOABS 0.5 07/18/2023 1420   EOSABS 0.2 07/18/2023 1420   BASOSABS 0.0 07/18/2023 1420    CMP     Component Value Date/Time   NA 144 07/29/2023 0906   K 4.0 07/29/2023 0906   CL 105 07/29/2023 0906   CO2 21 07/29/2023 0906   GLUCOSE 74 07/29/2023 0906   GLUCOSE 123 (H) 07/18/2023 1420   BUN 11 07/29/2023 0906   CREATININE 1.14 (H) 07/29/2023 0906   CREATININE 1.50 (H) 05/22/2018 1513   CALCIUM  10.0 07/29/2023 0906   CALCIUM  10.7 (H) 07/26/2009 2346   PROT 6.4 07/18/2023 1420   ALBUMIN 4.3 07/18/2023 1420   AST 19 07/18/2023 1420   ALT 11 07/18/2023 1420   ALKPHOS 56 07/18/2023 1420   BILITOT 0.7 07/18/2023 1420   GFRNONAA >60 06/28/2023 0622   GFRAA 52 (L) 01/14/2019 0421       Assessment and Plan:   Crystal Carroll is a 78 y.o. y/o female hospital follow-up of alcohol  induced pancreatitis and diverticulitis.  Currently resolved.  She has no more abdominal pain.  Mild elevated triglycerides, not etiology for pancreatitis.  Normal RUQ ultrasound, no gallstones.  1.  Alcohol induced pancreatitis: Uncomplicated.  No pseudocyst or abscess. - Encouraged cessation of all alcohol  2.  Uncomplicated mild diverticulitis, resolved s/p treatment with Augmentin . - Start Benefiber daily - MiraLAX if needed for constipation  3.  History of adenomatous colon polyps - 3-year repeat surveillance colonoscopy is optional, pending health status  4.  GERD - Continue pantoprazole  40 Mg once daily    Ellouise Console, PA-C  Follow up ***

## 2023-08-26 ENCOUNTER — Encounter: Payer: Self-pay | Admitting: Physician Assistant

## 2023-08-26 ENCOUNTER — Ambulatory Visit (INDEPENDENT_AMBULATORY_CARE_PROVIDER_SITE_OTHER): Admitting: Physician Assistant

## 2023-08-26 ENCOUNTER — Other Ambulatory Visit (INDEPENDENT_AMBULATORY_CARE_PROVIDER_SITE_OTHER)

## 2023-08-26 VITALS — BP 110/50 | HR 57 | Ht 64.0 in | Wt 132.8 lb

## 2023-08-26 DIAGNOSIS — K8500 Idiopathic acute pancreatitis without necrosis or infection: Secondary | ICD-10-CM

## 2023-08-26 DIAGNOSIS — Z860101 Personal history of adenomatous and serrated colon polyps: Secondary | ICD-10-CM

## 2023-08-26 DIAGNOSIS — Z8719 Personal history of other diseases of the digestive system: Secondary | ICD-10-CM | POA: Diagnosis not present

## 2023-08-26 DIAGNOSIS — F1091 Alcohol use, unspecified, in remission: Secondary | ICD-10-CM | POA: Diagnosis not present

## 2023-08-26 DIAGNOSIS — K219 Gastro-esophageal reflux disease without esophagitis: Secondary | ICD-10-CM | POA: Diagnosis not present

## 2023-08-26 LAB — CBC WITH DIFFERENTIAL/PLATELET
Basophils Absolute: 0 K/uL (ref 0.0–0.1)
Basophils Relative: 0.7 % (ref 0.0–3.0)
Eosinophils Absolute: 0.1 K/uL (ref 0.0–0.7)
Eosinophils Relative: 3.5 % (ref 0.0–5.0)
HCT: 38.2 % (ref 36.0–46.0)
Hemoglobin: 13 g/dL (ref 12.0–15.0)
Lymphocytes Relative: 18.8 % (ref 12.0–46.0)
Lymphs Abs: 0.7 K/uL (ref 0.7–4.0)
MCHC: 34 g/dL (ref 30.0–36.0)
MCV: 89.2 fl (ref 78.0–100.0)
Monocytes Absolute: 0.5 K/uL (ref 0.1–1.0)
Monocytes Relative: 11.8 % (ref 3.0–12.0)
Neutro Abs: 2.6 K/uL (ref 1.4–7.7)
Neutrophils Relative %: 65.2 % (ref 43.0–77.0)
Platelets: 180 K/uL (ref 150.0–400.0)
RBC: 4.29 Mil/uL (ref 3.87–5.11)
RDW: 12.9 % (ref 11.5–15.5)
WBC: 4 K/uL (ref 4.0–10.5)

## 2023-08-26 LAB — COMPREHENSIVE METABOLIC PANEL WITH GFR
ALT: 7 U/L (ref 0–35)
AST: 14 U/L (ref 0–37)
Albumin: 4.6 g/dL (ref 3.5–5.2)
Alkaline Phosphatase: 52 U/L (ref 39–117)
BUN: 12 mg/dL (ref 6–23)
CO2: 28 meq/L (ref 19–32)
Calcium: 9.4 mg/dL (ref 8.4–10.5)
Chloride: 105 meq/L (ref 96–112)
Creatinine, Ser: 1.07 mg/dL (ref 0.40–1.20)
GFR: 49.62 mL/min — ABNORMAL LOW (ref >60.00)
Glucose, Bld: 88 mg/dL (ref 70–99)
Potassium: 4.2 meq/L (ref 3.5–5.1)
Sodium: 141 meq/L (ref 135–145)
Total Bilirubin: 0.6 mg/dL (ref 0.2–1.2)
Total Protein: 6.4 g/dL (ref 6.0–8.3)

## 2023-08-26 LAB — LIPASE: Lipase: 88 U/L — ABNORMAL HIGH (ref 11.0–59.0)

## 2023-08-26 NOTE — Patient Instructions (Signed)
 Your provider has requested that you go to the basement level for lab work before leaving today. Press B on the elevator. The lab is located at the first door on the left as you exit the elevator.  Please follow up sooner if symptoms increase or worsen  Due to recent changes in healthcare laws, you may see the results of your imaging and laboratory studies on MyChart before your provider has had a chance to review them.  We understand that in some cases there may be results that are confusing or concerning to you. Not all laboratory results come back in the same time frame and the provider may be waiting for multiple results in order to interpret others.  Please give us  48 hours in order for your provider to thoroughly review all the results before contacting the office for clarification of your results.   Thank you for trusting me with your gastrointestinal care!   Ellouise Console, PA-C _______________________________________________________  If your blood pressure at your visit was 140/90 or greater, please contact your primary care physician to follow up on this.  _______________________________________________________  If you are age 63 or older, your body mass index should be between 23-30. Your Body mass index is 22.8 kg/m. If this is out of the aforementioned range listed, please consider follow up with your Primary Care Provider.  If you are age 27 or younger, your body mass index should be between 19-25. Your Body mass index is 22.8 kg/m. If this is out of the aformentioned range listed, please consider follow up with your Primary Care Provider.   ________________________________________________________  The [facility] providers would like to encourage you to use MYCHART to communicate with providers for non-urgent requests or questions.  Due to long hold times on the telephone, sending your provider a message by [facility] may be a faster and more efficient way to get a response.  Please  allow 48 business hours for a response.  Please remember that this is for non-urgent requests.  _______________________________________________________

## 2023-08-27 ENCOUNTER — Ambulatory Visit: Payer: Self-pay | Admitting: Physician Assistant

## 2023-08-27 DIAGNOSIS — K8500 Idiopathic acute pancreatitis without necrosis or infection: Secondary | ICD-10-CM

## 2023-08-28 DIAGNOSIS — M170 Bilateral primary osteoarthritis of knee: Secondary | ICD-10-CM | POA: Diagnosis not present

## 2023-08-28 DIAGNOSIS — M25561 Pain in right knee: Secondary | ICD-10-CM | POA: Diagnosis not present

## 2023-08-28 DIAGNOSIS — M25562 Pain in left knee: Secondary | ICD-10-CM | POA: Diagnosis not present

## 2023-08-28 NOTE — Telephone Encounter (Signed)
 I have advised patient of improving lipase and had asked that she return for lipase recheck in 1 months. Also advised all alcohol avoidance. She verbalizes understanding.

## 2023-08-29 NOTE — Progress Notes (Signed)
 Agree with the assessment and plan as outlined by Ellouise Console, PA-C.  Agree with follow up MRI to exclude mass lesion as cause of pancreatitis.  Unusual to see alcohol-induced pancreatitis in this demographic.

## 2023-09-04 ENCOUNTER — Ambulatory Visit (INDEPENDENT_AMBULATORY_CARE_PROVIDER_SITE_OTHER): Admitting: Family Medicine

## 2023-09-04 ENCOUNTER — Encounter: Payer: Self-pay | Admitting: Family Medicine

## 2023-09-04 VITALS — BP 120/70 | HR 55 | Temp 98.0°F | Wt 132.4 lb

## 2023-09-04 DIAGNOSIS — E785 Hyperlipidemia, unspecified: Secondary | ICD-10-CM

## 2023-09-04 DIAGNOSIS — F411 Generalized anxiety disorder: Secondary | ICD-10-CM

## 2023-09-04 DIAGNOSIS — Z8673 Personal history of transient ischemic attack (TIA), and cerebral infarction without residual deficits: Secondary | ICD-10-CM

## 2023-09-04 DIAGNOSIS — M48062 Spinal stenosis, lumbar region with neurogenic claudication: Secondary | ICD-10-CM

## 2023-09-04 DIAGNOSIS — I10 Essential (primary) hypertension: Secondary | ICD-10-CM

## 2023-09-04 DIAGNOSIS — E538 Deficiency of other specified B group vitamins: Secondary | ICD-10-CM

## 2023-09-04 NOTE — Progress Notes (Signed)
 Phone [phone] In person visit   Subjective:   Crystal Carroll is a 78 y.o. year old very pleasant female patient who presents for/with See problem oriented charting Chief Complaint  Patient presents with   Follow-up    1 month follow up. She states she has been having some balance issues.    Discussed the use of AI scribe software for clinical note transcription with the patient, who gave verbal consent to proceed.   History of Present Illness   Crystal Carroll is a 78 year old female who presents for follow-up on unstable gait, cholesterol, and blood pressure management.  She has a long-standing issue with unstable gait, described as 'annoying' but not alarming. The imbalance is intermittent, with periods of more stability and instability, and does not cause sudden instability while walking. In 2020, an MRI of the cervical spine showed spinal stenosis, and a lumbar spine MRI revealed moderately severe to severe multifactorial spinal and bilateral lateral recess stenosis at L4-L5, with a disc protrusion contacting the right L4 nerve Ramdass. Her symptoms have not worsened substantially since 2020.  Previously evaluated by neurology and they offered neurosurgical consult but she wanted to hold off at that time-she is now interested  She has a history of pancreatitis and has been alcohol-free since June 26, 2023, noting a possible mild improvement in balance since stopping alcohol.  Gastroenterology thought her pancreatitis was somewhat atypical and wanted to order MRI of the abdomen to rule out pancreatic mass-this will be completed after she continues her cardiac monitoring as below.  No abdominal pain is present.  She is currently on Celebrex for knee pain, prescribed after physical therapy did not yield significant improvement, and takes B12 supplementation.  Knee osteoarthritis likely contributes to the imbalance issues  She is using a cardiac monitor to evaluate for possible atrial  fibrillation, having worn it for approximately two to two and a half weeks out of the prescribed month. The monitor was sent back today for evaluation.  Her cholesterol management includes rosuvastatin  40 mg and Zetia  10 mg, which she continues to take.  For anxiety management, she takes Wellbutrin  150 mg extended release, buspirone  10 mg daily, and citalopram  20 mg, which she feels is effective. She also takes pantoprazole  for acid reflux, describing it as a 'miracle drug'.  She has a history of smoking but has quit. She is considering getting the RSV and flu vaccines later in the season.      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 02/24/2019    Priority: High   Hyperparathyroidism, primary (HCC) 07/17/2018    Priority: High   History of lacunar cerebrovascular accident (CVA) 06/18/2018    Priority: High   Mild aortic stenosis 07/19/2019    Priority: Medium    Osteoporosis 03/13/2018    Priority: Medium    DDD (degenerative disc disease), lumbar 03/05/2018    Priority: Medium    Aortic atherosclerosis (HCC) 06/12/2017    Priority: Medium    Palpitations 05/12/2017    Priority: Medium    Vitamin B12 deficiency 05/12/2017    Priority: Medium    Cervical radiculopathy 04/03/2016    Priority: Medium    Essential tremor 04/18/2015    Priority: Medium  CKD (chronic kidney disease), stage III (HCC) 10/15/2013    Priority: Medium    Former smoker 12/24/2006    Priority: Medium    Hyperlipidemia 05/26/2006    Priority: Medium    GAD (generalized anxiety disorder) 05/26/2006    Priority: Medium    Essential hypertension 05/26/2006    Priority: Medium    Patellofemoral arthritis 03/05/2018    Priority: Low   Unstable  gait 01/20/2018    Priority: Low   Benign neoplasm of colon 06/14/2014    Priority: Low   H/O cold sores 10/15/2013    Priority: Low   GERD 05/26/2006    Priority: Low   Acute pancreatitis 06/26/2023    Medications- reviewed and updated Current Outpatient Medications  Medication Sig Dispense Refill   acetaminophen  (TYLENOL ) 500 MG tablet Take 1,000 mg by mouth at bedtime.     aspirin EC 81 MG tablet Take 81 mg by mouth at bedtime.      buPROPion  (WELLBUTRIN  XL) 150 MG 24 hr tablet Take 1 tablet (150 mg total) by mouth daily. 30 tablet 5   busPIRone  (BUSPAR ) 10 MG tablet Take 1 tablet (10 mg total) by mouth daily. 30 tablet 5   celecoxib (CELEBREX) 200 MG capsule Take 1 capsule every day by oral route as needed.     citalopram  (CELEXA ) 20 MG tablet TAKE ONE TABLET BY MOUTH ONCE DAILY 90 tablet 3   cyanocobalamin  (VITAMIN B12) 1000 MCG tablet Take 1,000 mcg by mouth daily.     ezetimibe  (ZETIA ) 10 MG tablet Take 1 tablet (10 mg total) by mouth daily. 90 tablet 3   magnesium oxide (MAG-OX) 400 (240 Mg) MG tablet Take 250 mg by mouth daily.     pantoprazole  (PROTONIX ) 40 MG tablet Take 1 tablet (40 mg total) by mouth daily. 30 tablet 3   rosuvastatin  (CRESTOR ) 40 MG tablet TAKE ONE TABLET BY MOUTH DAILY 90 tablet 3   Turmeric 500 MG CAPS Take 500 mg by mouth daily.     ascorbic acid (VITAMIN C) 500 MG tablet Take 500 mg by mouth in the morning. (Patient not taking: Reported on 09/04/2023)     Cholecalciferol (VITAMIN D3) 50 MCG (2000 UT) TABS Take 2,000 Units by mouth in the morning. (Patient not taking: Reported on 09/04/2023)     potassium chloride  20 MEQ/15ML (10%) SOLN Take 7.5 mLs (10 mEq total) by mouth 2 (two) times daily. (Patient not taking: Reported on 09/04/2023) 473 mL 0   No current facility-administered medications for this visit.     Objective:  BP 120/70   Pulse (!) 55   Temp 98 F (36.7 C) (Temporal)   Wt 132 lb 6.4 oz (60.1 kg)   LMP  (LMP Unknown)   SpO2 97%   BMI  22.73 kg/m  Gen: NAD, resting comfortably CV: slightly bradycardic but regular no murmurs rubs or gallops Lungs: CTAB no crackles, wheeze, rhonchi Abdomen: soft/nontender/nondistended/normal bowel sounds.  Ext: no edema Skin: warm, dry Neuro: walks with cane with imbalance history     Assessment and Plan      #Gait instability contributed to by cervical and lumbar spinal stenosis Chronic gait instability attributed to cervical and lumbar spinal stenosis. MRI from 2020 showed moderately severe to severe multifactorial spinal and bilateral lateral recess stenosis at L4-L5, with a disc protrusion contacting the right L4 nerve Faulstich. Symptoms are stable since 2020. Discussed potential contribution of spinal stenosis to gait instability. She prefers neurosurgery over orthopedic intervention and has a  previous relationship with neurosurgeon Dr. Chyrl Budge. -Previously evaluated by neurology and they offered neurosurgical consult but she wanted to hold off at that time-she is now interested - Refer to neurosurgery for evaluation of spinal stenosis and gait instability. - Print copies of cervical and lumbar spine MRI reports for neurosurgery consultation.  #Bilateral knee osteoarthritis with pain Bilateral knee osteoarthritis with pain, currently managed with Celebrex. Previous physical therapy was not effective. Discussed potential for steroid injection if Celebrex is not effective. - Continue Celebrex for knee pain management. - Consider steroid injection if Celebrex is ineffective.  #Possible atrial fibrillation, under evaluation Possible atrial fibrillation under evaluation. She used a cardiac monitor for approximately two and a half weeks. The monitor is being sent back for analysis. - Send cardiac monitor data for analysis.  #history pancreatitis, alcohol-related, in remission Alcohol-related acute pancreatitis, currently in remission. She has been abstinent from alcohol since June 26, 2023. Gastroenterology plans to perform an MRI of the abdomen to rule out other causes of pancreatitis, such as a pancreatic mass. - Perform MRI of the abdomen to rule out pancreatic mass.  #Hyperlipidemia Hyperlipidemia well-controlled with rosuvastatin  40 mg and ezetimibe  10 mg. Cholesterol levels are excellent, and the treatment also reduces the risk of heart attack and stroke. - Continue rosuvastatin  40 mg and ezetimibe  10 mg.  #Hypertension Hypertension is well controlled with current medication regimen- has not required recent antihypertensives thankfully. Could contribute to imbalance so extra cautious  #Depression and anxiety Depression and anxiety are managed with bupropion  150 mg extended release, buspirone  10 mg daily, and citalopram  20 mg. She reports satisfaction with current regimen. - Continue bupropion  150 mg extended release, buspirone  10 mg daily, and citalopram  20 mg.  #Gastroesophageal reflux disease (GERD) GERD is well managed with pantoprazole , which she describes as a 'miracle drug'. - Continue pantoprazole  for GERD management.      Recommended follow up: Return in about 2 months (around 11/04/2023) for followup or sooner if needed.Schedule b4 you leave. Future Appointments  Date Time Provider Department Center  10/28/2023 11:20 AM Rana Lum CROME, NP [facility]    Lab/Order associations:   ICD-10-CM   1. Spinal stenosis of lumbar region with neurogenic claudication  M48.062 Ambulatory referral to Neurosurgery    2. History of lacunar cerebrovascular accident (CVA)  Z86.73     3. Hyperlipidemia, unspecified hyperlipidemia type  E78.5     4. Essential hypertension  I10     5. Vitamin B12 deficiency  E53.8     6. GAD (generalized anxiety disorder)  F41.1       No orders of the defined types were placed in this encounter.   Return precautions advised.  Garnette Lukes, MD

## 2023-09-04 NOTE — Patient Instructions (Addendum)
 YOUR PLAN:  GAIT INSTABILITY DUE TO CERVICAL AND LUMBAR SPINAL STENOSIS: Your chronic gait instability is likely contributed to byto spinal stenosis in your cervical and lumbar spine. Ongoing symptoms since 2020.  -We will refer you to neurosurgery for further evaluation of your spinal stenosis and gait instability. -Copies of your cervical and lumbar spine MRI reports will be printed for your neurosurgery consultation to take with you -If you do not hear from neurosurgery Dr. Mavis within a week, please contact them.  BILATERAL KNEE OSTEOARTHRITIS WITH PAIN: You have pain in both knees due to osteoarthritis, currently managed with Celebrex. -Celebrex does carry some heart and stroke risk so we want to keep use to short term -If Celebrex is not effective, we may consider a steroid injection with ortho  POSSIBLE ATRIAL FIBRILLATION, UNDER EVALUATION: We are evaluating you for possible atrial fibrillation. You have worn a cardiac monitor for about two and a half weeks. -The cardiac monitor data will be sent for analysis once you submit  ACUTE PANCREATITIS, ALCOHOL-RELATED, IN REMISSION: Your alcohol-related acute pancreatitis is in remission. You have been abstinent from alcohol since June 26, 2023. Congrats! -An MRI of your abdomen will be performed to rule out a pancreatic mass- I agree with this plan  Contains text generated by [employer].    Recommended follow up: Return in about 2 months (around 11/04/2023) for followup or sooner if needed.Schedule b4 you leave.

## 2023-09-05 ENCOUNTER — Other Ambulatory Visit: Payer: Self-pay | Admitting: Family Medicine

## 2023-09-09 ENCOUNTER — Other Ambulatory Visit: Payer: Self-pay

## 2023-09-09 DIAGNOSIS — R109 Unspecified abdominal pain: Secondary | ICD-10-CM

## 2023-09-09 DIAGNOSIS — R7989 Other specified abnormal findings of blood chemistry: Secondary | ICD-10-CM

## 2023-09-09 DIAGNOSIS — R748 Abnormal levels of other serum enzymes: Secondary | ICD-10-CM

## 2023-09-09 DIAGNOSIS — K8500 Idiopathic acute pancreatitis without necrosis or infection: Secondary | ICD-10-CM

## 2023-09-10 ENCOUNTER — Ambulatory Visit: Attending: Emergency Medicine

## 2023-09-10 DIAGNOSIS — I4891 Unspecified atrial fibrillation: Secondary | ICD-10-CM

## 2023-09-17 ENCOUNTER — Ambulatory Visit (HOSPITAL_COMMUNITY)
Admission: RE | Admit: 2023-09-17 | Discharge: 2023-09-17 | Disposition: A | Discharge status: Home / Self Care | Source: Ambulatory Visit | Attending: Physician Assistant | Admitting: Physician Assistant

## 2023-09-17 ENCOUNTER — Other Ambulatory Visit: Payer: Self-pay | Admitting: Physician Assistant

## 2023-09-17 DIAGNOSIS — R748 Abnormal levels of other serum enzymes: Secondary | ICD-10-CM

## 2023-09-17 DIAGNOSIS — K8500 Idiopathic acute pancreatitis without necrosis or infection: Secondary | ICD-10-CM | POA: Insufficient documentation

## 2023-09-17 DIAGNOSIS — R7989 Other specified abnormal findings of blood chemistry: Secondary | ICD-10-CM

## 2023-09-17 DIAGNOSIS — K859 Acute pancreatitis without necrosis or infection, unspecified: Secondary | ICD-10-CM | POA: Diagnosis not present

## 2023-09-17 DIAGNOSIS — R109 Unspecified abdominal pain: Secondary | ICD-10-CM | POA: Insufficient documentation

## 2023-09-17 DIAGNOSIS — K5710 Diverticulosis of small intestine without perforation or abscess without bleeding: Secondary | ICD-10-CM | POA: Diagnosis not present

## 2023-09-17 MED ORDER — GADOBUTROL 1 MMOL/ML IV SOLN
6.0000 mL | Freq: Once | INTRAVENOUS | Status: AC | PRN
  Administered 2023-09-17: 6 mL via INTRAVENOUS

## 2023-09-19 ENCOUNTER — Ambulatory Visit: Payer: Self-pay | Admitting: Physician Assistant

## 2023-09-25 ENCOUNTER — Encounter: Payer: Self-pay | Admitting: *Deleted

## 2023-09-28 ENCOUNTER — Other Ambulatory Visit: Payer: Self-pay | Admitting: Family Medicine

## 2023-10-07 DIAGNOSIS — R292 Abnormal reflex: Secondary | ICD-10-CM | POA: Diagnosis not present

## 2023-10-07 DIAGNOSIS — M4313 Spondylolisthesis, cervicothoracic region: Secondary | ICD-10-CM | POA: Diagnosis not present

## 2023-10-07 DIAGNOSIS — R2681 Unsteadiness on feet: Secondary | ICD-10-CM | POA: Diagnosis not present

## 2023-10-07 DIAGNOSIS — M4802 Spinal stenosis, cervical region: Secondary | ICD-10-CM | POA: Diagnosis not present

## 2023-10-09 ENCOUNTER — Other Ambulatory Visit: Payer: Self-pay | Admitting: Neurosurgery

## 2023-10-09 DIAGNOSIS — M4802 Spinal stenosis, cervical region: Secondary | ICD-10-CM

## 2023-10-19 DIAGNOSIS — I4891 Unspecified atrial fibrillation: Secondary | ICD-10-CM

## 2023-10-21 ENCOUNTER — Telehealth: Payer: Self-pay | Admitting: Emergency Medicine

## 2023-10-21 NOTE — Telephone Encounter (Signed)
 Pt is returning call to nurse for results

## 2023-10-22 NOTE — Telephone Encounter (Signed)
 Reviewed the chart and saw that the patient was calling back to go over monitor results. Reviewed the Mag APP Result Pool and did not see the result. Spoke with the patient who states she was returning Crystals call.   Reviewed the following results with her.   Crystal LITTIE Louis, NP 10/21/2023  8:43 AM EDT     Heart monitor showed no significant arrhythmias.  Your predominant rhythm was normal sinus rhythm.  Your average heart rate was 61 bpm.  There was no atrial fibrillation found.  Overall very reassuring heart monitor.  Continue current medication regimen and follow-up as planned.    Patient has been notified directly; all questions, if any, were answered. Patient voiced understanding.   Message has been sent to the covering to make her aware.

## 2023-10-28 ENCOUNTER — Ambulatory Visit: Attending: Emergency Medicine | Admitting: Emergency Medicine

## 2023-10-28 ENCOUNTER — Encounter: Payer: Self-pay | Admitting: Emergency Medicine

## 2023-10-28 VITALS — BP 122/64 | HR 60 | Ht 63.0 in | Wt 126.0 lb

## 2023-10-28 DIAGNOSIS — I350 Nonrheumatic aortic (valve) stenosis: Secondary | ICD-10-CM

## 2023-10-28 DIAGNOSIS — I10 Essential (primary) hypertension: Secondary | ICD-10-CM

## 2023-10-28 DIAGNOSIS — E785 Hyperlipidemia, unspecified: Secondary | ICD-10-CM

## 2023-10-28 DIAGNOSIS — I498 Other specified cardiac arrhythmias: Secondary | ICD-10-CM | POA: Diagnosis not present

## 2023-10-28 NOTE — Progress Notes (Signed)
 Cardiology Office Note:    Date:  10/28/2023  ID:  RAFFAELLA EDISON, DOB 1945-03-29, MRN [id number] PCP: Katrinka Garnette KIDD, MD  [facility] Providers Cardiologist:  Lonni LITTIE Nanas, MD Cardiology APP:  Rana Lum LITTIE, NP       Patient Profile:       Chief Complaint: 59-month follow-up History of Present Illness:  Crystal Carroll is a 78 y.o. female with visit-pertinent history of hypertension, hyperlipidemia, hyperparathyroidism, CKD stage III, mild aortic stenosis   She was initially referred to cardiology on 11/11/2018 for evaluation of palpitations.  She wore a 30-day cardiac monitor which showed predominantly sinus rhythm and most other triggered events were sinus rhythm.  However there was occasional episodes of accelerated junctional rhythm and 2 of her triggered events involve the junctional rhythm.  During her initial visit it was discussed that she likely had some degree of sick sinus syndrome, there was discussion regarding an exercise treadmill test to evaluate for chronotropic incompetence however she declined the treadmill test as she did not think that she could walk on a treadmill due to her knee pain.  TTE during that time showed normal LV and RV function, mild aortic stenosis, small PFO.  Repeat echocardiogram on 02/2020 showed mild aortic stenosis with mean gradient of 12 mmHg and normal biventricular function.   Seen in office on 07/02/2022 by Lamarr, NP.  Routine echocardiogram was ordered for surveillance on 07/30/2022 showing normal biventricular function and no evidence of aortic stenosis.   She was seen in the ED on 06/06/2023 with abdominal pain and diagnosed with uncomplicated diverticulitis and started on antibiotics.  Interestingly the EKG read atrial fibrillation however this was not addressed by the ED physician.   She was recently admitted to the hospital on 06/26/2023 through 06/28/2023 for acute pancreatitis.  Most likely etiology seems to be  alcohol.  She remained in sinus rhythm throughout her admission.  She is seen for follow-up on 07/29/2023.  She was doing well without acute cardiovascular concerns.  She has abstained from any alcohol use.  Her liver enzymes have improved.  EKG from 06/06/2023 while in the ED in the setting of diverticulitis was reviewed and had artifact in several leads so unclear if true atrial fibrillation.  The rhythm seemed to be regular.  She denied any symptoms concerning for atrial fibrillation.  30-day cardiac event monitor was ordered showing no significant arrhythmias, no atrial fibrillation, sustained ventricular tachycardia, significant pauses, or high degree AV block, and average heart rate of 61 bpm.   Discussed the use of AI scribe software for clinical note transcription with the patient, who gave verbal consent to proceed.  Today patient is doing well without acute cardiovascular concerns or complaints.  She has continued to abstain from alcohol since June of this year.  She remains fairly active and does routinely annual with her friends.  She maintains her heart rate and rhythm on her Apple watch with no alerts.  She is without any symptoms concerning for atrial fibrillation.  No palpitations or irregular heart rates notified.  She remains without chest pains, dyspnea, orthopnea, PND, or LEE.  Overall she is very happy with her health and has lost some weight over the past several months.   Review of systems:  Please see the history of present illness. All other systems are reviewed and otherwise negative.      Studies Reviewed:        Cardiac event monitor 09/10/2023   No significant  arrhythmias   Predominant rhythm is sinus rhythm. Range is 49-93 bpm with average of 61 bpm. No atrial fibrillation, sustained ventricular tachycardia, significant pause, or high degree AV block. Total ectopy <1%.  No patient triggered events. No significant abnormalities.  Echocardiogram 07/30/2022 Left  ventricular ejection fraction, by estimation, is 60 to 65%. The left ventricle has normal function. The left ventricle has no regional wall motion abnormalities. Left ventricular diastolic parameters were normal. 1. 2. Right ventricular systolic function is normal. The right ventricular size is normal. The mitral valve is normal in structure. No evidence of mitral valve regurgitation. No evidence of mitral stenosis. 3. The aortic valve is normal in structure. There is mild calcification of the aortic valve. Aortic valve regurgitation is not visualized. No aortic stenosis is present. Aortic valve mean gradient measures 10 mmHg. Aortic valve Vmax measures 2.04 m/s. 4. The inferior vena cava is normal in size with greater than 50% respiratory variability, suggesting right atrial pressure of 3 mmHg.   Echocardiogram 02/14/2020 1. The aortic valve is tricuspid. There is mild calcification of the  aortic valve. There is mild thickening of the aortic valve. Aortic valve  regurgitation is not visualized. Aortic valve area, by VTI measures 1.98  cm. Aortic valve mean gradient  measures 12.0 mmHg. Aortic valve Vmax measures 2.41 m/s.   2. Left ventricular ejection fraction, by estimation, is 60 to 65%. Left  ventricular ejection fraction by 3D volume is 62 %. The left ventricle has  normal function. The left ventricle has no regional wall motion  abnormalities. Left ventricular diastolic   parameters were normal. The average left ventricular global longitudinal  strain is -20.6 %. The global longitudinal strain is normal.   3. Right ventricular systolic function is normal. The right ventricular  size is normal. Tricuspid regurgitation signal is inadequate for assessing  PA pressure.   4. The mitral valve is grossly normal. No evidence of mitral valve  regurgitation. No evidence of mitral stenosis.   5. The inferior vena cava is normal in size with greater than 50%  respiratory variability, suggesting right  atrial pressure of 3 mmHg.    Cardiac monitor 06/18/2018 Predominant rhythm is sinus No atrial fibrillation No pauses There are occasional episodes of junction rhythm (HR in the 60-70's) with underlying P wave activity (Isorhythmic dissociation) which are associated with the sensation of rapid beats or flutter Would recommend exercise treadmill test to demonstrate adequate chronotropic competance and would avoid AV nodal blocking agents (beta blockers, diltiazem for instance)  Cardiac monitor 07/2017 Normal sinus rhythm No bradycardia or atrial fibrillation No correlation between symptoms or rhythm disturbance.   Risk Assessment/Calculations:              Physical Exam:   VS:  BP 122/64   Pulse 60   Ht 5' 3 (1.6 m)   Wt 126 lb (57.2 kg)   LMP  (LMP Unknown)   SpO2 94%   BMI 22.32 kg/m    Wt Readings from Last 3 Encounters:  10/28/23 126 lb (57.2 kg)  09/04/23 132 lb 6.4 oz (60.1 kg)  08/26/23 132 lb 12.8 oz (60.2 kg)    GEN: Well nourished, well developed in no acute distress NECK: No JVD; No carotid bruits CARDIAC: RRR, no murmurs, rubs, gallops RESPIRATORY:  Clear to auscultation without rales, wheezing or rhonchi  ABDOMEN: Soft, non-tender, non-distended EXTREMITIES:  No edema; No acute deformity       Assessment and Plan:  Aortic valve stenosis Echocardiogram  02/2020 showed mild aortic stenosis with mean gradient of 12 mmHg Repeat echocardiogram 07/2022 showed no evidence of aortic stenosis with mean gradient 10 mmHg - Today she remains asymptomatic - There is no indication for intervention at this time   Hyperlipidemia LDL 39 on 07/2023 and well-controlled - Continue rosuvastatin  40 mg daily and ezetimibe  10 mg daily - Heart healthy diet encouraged  Possible atrial fibrillation EKG 06/06/2023 while in the ED in the setting of diverticulitis read atrial fibrillation however this was not addressed during the ED visit nor repeat EKG was obtained.  EKG was reviewed  by myself and showed a lot of artifact so there was no discernible P waves.  The rhythm was regular and inconsistent with atrial fibrillation - Prior event monitors in 06/2018 and 07/2017 showed no episodes of atrial fibrillation - 30-day event monitor on 09/2023 showed no significant arrhythmias and no atrial fibrillation - Today she denies any symptoms concerning for atrial fibrillation - She is maintaining rhythm monitoring on Apple Watch with no alerts   Junctional rhythm Occasional episodes of accelerated junctional rhythm noted on 30-day cardiac monitor in 2020.  It was suspected likely has some degree of sick sinus syndrome.  There was discussion of getting exercise treadmill test to evaluate for chronotropic incompetence however at that time patient declined treadmill stress test as she did not think she could walk on a treadmill due to chronic knee pain. - 30-day cardiac event monitor on 09/2023 showed no episodes of junctional rhythm with average heart rate of 61 bpm with no significant bradycardia or AV block - She has no evidence of symptomatic bradycardia at this time, no indication for pacemaker  - Recommend avoidance of AV nodal blocking agents   Hypertension Blood pressure today is 122/64 and well-controlled - Continue current antihypertensive regimen      Dispo:  Return in about 6 months (around 04/27/2024).  Signed, Lum LITTIE Louis, NP

## 2023-10-28 NOTE — Patient Instructions (Addendum)
 Medication Instructions:  NO CHANGES  Lab Work: NONE TO BE DONE TODAY.  Testing/Procedures: NONE  Follow-Up: At [facility], you and your health needs are our priority.  As part of our continuing mission to provide you with exceptional heart care, our providers are all part of one team.  This team includes your primary Cardiologist (physician) and Advanced Practice Providers or APPs (Physician Assistants and Nurse Practitioners) who all work together to provide you with the care you need, when you need it.  Your next appointment:   6 MONTHS  Provider:   Lonni LITTIE Nanas, MD OR Lum Louis, NP

## 2023-10-29 ENCOUNTER — Ambulatory Visit
Admission: RE | Admit: 2023-10-29 | Discharge: 2023-10-29 | Disposition: A | Discharge status: Home / Self Care | Source: Ambulatory Visit | Attending: Neurosurgery | Admitting: Neurosurgery

## 2023-10-29 ENCOUNTER — Other Ambulatory Visit: Payer: Self-pay | Admitting: Family Medicine

## 2023-10-29 DIAGNOSIS — M4802 Spinal stenosis, cervical region: Secondary | ICD-10-CM | POA: Diagnosis not present

## 2023-10-29 DIAGNOSIS — M47812 Spondylosis without myelopathy or radiculopathy, cervical region: Secondary | ICD-10-CM | POA: Diagnosis not present

## 2023-10-29 DIAGNOSIS — M4312 Spondylolisthesis, cervical region: Secondary | ICD-10-CM | POA: Diagnosis not present

## 2023-10-29 DIAGNOSIS — M5021 Other cervical disc displacement,  high cervical region: Secondary | ICD-10-CM | POA: Diagnosis not present

## 2023-11-04 ENCOUNTER — Ambulatory Visit: Admitting: Family Medicine

## 2023-11-04 ENCOUNTER — Encounter: Payer: Self-pay | Admitting: Family Medicine

## 2023-11-04 VITALS — BP 100/60 | HR 61 | Temp 98.1°F | Ht 63.0 in | Wt 128.2 lb

## 2023-11-04 DIAGNOSIS — E538 Deficiency of other specified B group vitamins: Secondary | ICD-10-CM | POA: Diagnosis not present

## 2023-11-04 DIAGNOSIS — E785 Hyperlipidemia, unspecified: Secondary | ICD-10-CM | POA: Diagnosis not present

## 2023-11-04 DIAGNOSIS — I10 Essential (primary) hypertension: Secondary | ICD-10-CM | POA: Diagnosis not present

## 2023-11-04 DIAGNOSIS — N183 Chronic kidney disease, stage 3 unspecified: Secondary | ICD-10-CM | POA: Diagnosis not present

## 2023-11-04 DIAGNOSIS — F411 Generalized anxiety disorder: Secondary | ICD-10-CM | POA: Diagnosis not present

## 2023-11-04 NOTE — Progress Notes (Signed)
 Phone [phone] In person visit   Subjective:   Crystal Carroll is a 78 y.o. 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 02/24/2019    Priority: High   Hyperparathyroidism, primary 07/17/2018    Priority: High   History of lacunar cerebrovascular accident (CVA) 06/18/2018    Priority: High   Mild aortic stenosis 07/19/2019    Priority: Medium    Osteoporosis 03/13/2018    Priority: Medium    DDD (degenerative disc disease), lumbar 03/05/2018    Priority: Medium    Aortic atherosclerosis 06/12/2017    Priority: Medium    Palpitations 05/12/2017    Priority: Medium    Vitamin B12 deficiency 05/12/2017    Priority: Medium    Cervical radiculopathy 04/03/2016    Priority: Medium    Essential tremor 04/18/2015    Priority: Medium    CKD (chronic kidney disease), stage III (HCC) 10/15/2013    Priority: Medium    Former smoker 12/24/2006    Priority: Medium    Hyperlipidemia 05/26/2006    Priority: Medium    GAD (generalized anxiety disorder) 05/26/2006    Priority: Medium    Essential hypertension 05/26/2006    Priority: Medium    Patellofemoral arthritis 03/05/2018    Priority: Low   Unstable gait 01/20/2018    Priority: Low   Benign neoplasm of colon 06/14/2014    Priority: Low   H/O cold sores 10/15/2013    Priority: Low   GERD 05/26/2006    Priority: Low   Acute pancreatitis  06/26/2023    Medications- reviewed and updated Current Outpatient Medications  Medication Sig Dispense Refill   acetaminophen  (TYLENOL ) 500 MG tablet Take 1,000 mg by mouth at bedtime.     aspirin EC 81 MG tablet Take 81 mg by mouth at bedtime.      buPROPion  (WELLBUTRIN  XL) 150 MG 24 hr tablet Take 1 tablet (150 mg total) by mouth daily. 30 tablet 5   busPIRone  (BUSPAR ) 10 MG tablet Take 1 tablet (10 mg total) by mouth daily. 30 tablet 5   citalopram  (CELEXA ) 20 MG tablet TAKE ONE TABLET BY MOUTH ONCE DAILY 90 tablet 3   cyanocobalamin  (VITAMIN B12) 1000 MCG tablet Take 1,000 mcg by mouth daily.     ezetimibe  (ZETIA ) 10 MG tablet Take 1 tablet (10 mg total) by mouth daily. 90 tablet 3   magnesium oxide (MAG-OX) 400 (240 Mg) MG tablet Take 250 mg by mouth daily.     pantoprazole  (PROTONIX ) 40 MG tablet Take 1 tablet (40 mg total) by mouth daily. 30 tablet 3   rosuvastatin  (CRESTOR ) 40 MG tablet TAKE ONE TABLET BY MOUTH DAILY 90 tablet 3   Turmeric 500 MG CAPS Take 500 mg by mouth daily.     ascorbic acid (VITAMIN C) 500 MG tablet Take 500 mg by mouth in the morning. (Patient not taking: Reported on 11/04/2023)     Cholecalciferol (VITAMIN  D3) 50 MCG (2000 UT) TABS Take 2,000 Units by mouth in the morning. (Patient not taking: Reported on 11/04/2023)     No current facility-administered medications for this visit.     Objective:  BP 100/60 (BP Location: Left Arm, Patient Position: Sitting, Cuff Size: Normal)   Pulse 61   Temp 98.1 F (36.7 C) (Temporal)   Ht 5' 3 (1.6 m)   Wt 128 lb 3.2 oz (58.2 kg)   LMP  (LMP Unknown)   SpO2 96%   BMI 22.71 kg/m  Gen: NAD, resting comfortably CV: RRR no murmurs rubs or gallops Lungs: CTAB no crackles, wheeze, rhonchi Ext: no edema Skin: warm, dry    Assessment and Plan   #social update- considering chair yoga. Doing garden club and mahjong- some days she doesn't want to do anything but doesn't panic and gets to it the next day  #  Patient sustained superficial laceration on the right lower leg last Saturday or Sunday- shin scrape basically- last Tetanus, Diphtheria, and Pertussis (Tdap) was 2018 so up to date . She is dressing daily and we changed her dressing today-no signs of infection-she will keep an eye out for expanding redness worsening pain or purulent material from the wound  # Cardiology visit follow-up S:Patient with cardiology visit and with cardiac telemetry for 30 days 09/10/2023 showing no significant arrhythmias thankfully and specifically no atrial fibrillation -Prior question of atrial fibrillation emergency department in May in setting of diverticulitis but when they looked at original EKG appeared to be more artifact -Also wears Apple Watch with no events reported -Looking back she did have some accelerated junctional rhythm noted on 30-day event monitor in 2020 and question of sick sinus syndrome but no episodes most recently.  No evidence of symptomatic bradycardia send no indication for pacemaker but AV nodal blocking agents should be avoided  A/P: thankfully very reassuring workup with cardiology with 30-day event monitor and no recurrence of questionable atrial fibrillation and they did not think the original event even indicated atrial fibrillation thankfully. - I will avoid advise nodal blocking agents   # Knee pain S:she did physical therapy and was not helpful and tried Celebrex but felt dizzy and had to stop - was ordered through Dr. Arnaldo with [facility].  A/P: she is going to schedule follow up with [facility] to consider injection of knee with ongoing pain.    # MRI result discussion from neurosurgery S: Most recently on 10/29/2023 patient with MRI cervical spine which showed C7-T1 grade 1 anterior listhesis with small disc bulge causing mild spinal canal stenosis slightly worsened from previous along with C3-C4 severe left foraminal stenosis unchanged and C5-C6 anterior fusion with mild left  foraminal stenosis  This was done due to gait instability, hyperreflexia and history of cervical stenosis history-plan was to see her back afterwards as she reports- has upcoming appointment- no worsening of balance symptoms but can still have bad days  A/P: ongoing mild spinal canal stenosis at c7-t1 but I do not think that is causing her symptoms gait instability    #lost 4 lbs- maintaining healthy weight. Avoiding drinking and pancreas diet are helping Wt Readings from Last 3 Encounters:  11/04/23 128 lb 3.2 oz (58.2 kg)  10/28/23 126 lb (57.2 kg)  09/04/23 132 lb 6.4 oz (60.1 kg)   #Hyperlipidemia/aortic atherosclerosis/ history of stroke- on aspirin 81 mg with LDL goal under 70 S: Compliant with rosuvastatin  40 mg and Zetia  10 mg.   Lab Results  Component Value Date   CHOL 116 07/18/2023   HDL 53.50 07/18/2023   LDLCALC 39 07/18/2023   LDLDIRECT 106 (H) 07/19/2016   TRIG 118.0 07/18/2023   CHOLHDL 2 07/18/2023  A/P:lipids at goal- continue current medications    #Hypertension/CKD stage III S: Patient remains off lisinopril  fortunately blood pressure remains controlled  -GFR has been stable largely in the 40s lately- in the past had been in the 30s A/P: still doing well off of medicine- continue to monitor   -GFR at 49 most recnetly- continue to monitor - doesn't need check today  #B12 deficiency- on oral B12 with relative lows - still taking   #Anxiety/stress management S: Compliant with Celexa  20 mg daily , Wellbutrin  150mg  XR (reduced from 300mg  due to anxiety concern). Buspirone  7.5 mg BID has been helpful thankfully S: Compliant with Celexa  20 mg, Wellbutrin  150 mg extended release(prior 300 but reduced in past to help with anxiety), buspirone   A/P:  she is doing well with current regimen and feels controlling stress enough of the hepatitis her not drink- wants to continue current medications - refill if needed  Recommended follow up: Return in about 2 months (around  01/04/2024) for followup or sooner if needed.Schedule b4 you leave.  Lab/Order associations:   ICD-10-CM   1. Hyperlipidemia, unspecified hyperlipidemia type  E78.5     2. Stage 3 chronic kidney disease, unspecified whether stage 3a or 3b CKD (HCC)  N18.30     3. Essential hypertension  I10     4. Vitamin B12 deficiency  E53.8     5. GAD (generalized anxiety disorder)  F41.1       No orders of the defined types were placed in this encounter.   Return precautions advised.  Garnette Lukes, MD

## 2023-11-04 NOTE — Patient Instructions (Addendum)
 Glad you are doing well!   Continue current medications   Recommended follow up: Return in about 2 months (around 01/04/2024) for followup or sooner if needed.Schedule b4 you leave.

## 2023-12-19 ENCOUNTER — Encounter: Payer: Self-pay | Admitting: Pharmacist

## 2023-12-19 NOTE — Progress Notes (Signed)
 Pharmacy Quality Measure Review  This patient is appearing on a report for being at risk of failing the adherence measure for cholesterol (statin) medications this calendar year.   Medication: rosuvastatin   Last fill date: 08/18/2023 for 90 day supply per adherence report.   Reviewed recent refill history in Dr Annemarie database. Actual last refill date was 11/19/2023 for 90 day supply. Patient has 2 refills remaining. Next appointment with PCP is 01/12/2024.    Insurance report was not up to date. No action needed at this time.   Madelin Ray, PharmD Clinical Pharmacist [facility]  Population Health [phone]

## 2024-01-12 ENCOUNTER — Encounter: Payer: Self-pay | Admitting: Family Medicine

## 2024-01-12 ENCOUNTER — Ambulatory Visit (INDEPENDENT_AMBULATORY_CARE_PROVIDER_SITE_OTHER): Admitting: Family Medicine

## 2024-01-12 VITALS — BP 130/62 | HR 62 | Temp 98.2°F | Ht 63.0 in | Wt 120.6 lb

## 2024-01-12 DIAGNOSIS — M85851 Other specified disorders of bone density and structure, right thigh: Secondary | ICD-10-CM | POA: Diagnosis not present

## 2024-01-12 DIAGNOSIS — E538 Deficiency of other specified B group vitamins: Secondary | ICD-10-CM | POA: Diagnosis not present

## 2024-01-12 DIAGNOSIS — N183 Chronic kidney disease, stage 3 unspecified: Secondary | ICD-10-CM | POA: Diagnosis not present

## 2024-01-12 DIAGNOSIS — I10 Essential (primary) hypertension: Secondary | ICD-10-CM | POA: Diagnosis not present

## 2024-01-12 DIAGNOSIS — M85852 Other specified disorders of bone density and structure, left thigh: Secondary | ICD-10-CM | POA: Diagnosis not present

## 2024-01-12 DIAGNOSIS — M4802 Spinal stenosis, cervical region: Secondary | ICD-10-CM

## 2024-01-12 DIAGNOSIS — E785 Hyperlipidemia, unspecified: Secondary | ICD-10-CM | POA: Diagnosis not present

## 2024-01-12 NOTE — Patient Instructions (Addendum)
 Schedule your bone density test at check out desk.  - located [address] across the street from [place] - in the basement - you DO NEED an appointment for the bone density tests.    Please stop by lab before you go If you have mychart- we will send your results within 3 business days of us  receiving them.  If you do not have mychart- we will call you about results within 5 business days of us  receiving them.  *please also note that you will see labs on mychart as soon as they post. I will later go in and write notes on them- will say notes from Dr. Katrinka   Recommended follow up: Return in about 6 months (around 07/11/2024) for physical or sooner if needed.Schedule b4 you leave.

## 2024-01-12 NOTE — Progress Notes (Signed)
 " Phone [phone] In person visit   Subjective:   Crystal Carroll is a 79 y.o. year old very pleasant female patient who presents for/with See problem oriented charting Chief Complaint  Patient presents with   Medical Management of Chronic Issues    2 month follow up; patient states she stressed and is no sleeping and losing her hair due to the stress; neurosurgery would like to operate and she has 3 months to decide; would like a bone density;     Past Medical History-  Patient Active Problem List   Diagnosis Date Noted   Cervical spinal stenosis 01/12/2024    Priority: High   Osteoporosis without current pathological fracture 09/10/2019    Priority: High   History of lobectomy of thyroid  02/24/2019    Priority: High   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 02/24/2019    Priority: High   Hyperparathyroidism, primary 07/17/2018    Priority: High   History of lacunar cerebrovascular accident (CVA) 06/18/2018    Priority: High   Mild aortic stenosis 07/19/2019    Priority: Medium    Osteoporosis 03/13/2018    Priority: Medium    DDD (degenerative disc disease), lumbar 03/05/2018    Priority: Medium    Aortic atherosclerosis 06/12/2017    Priority: Medium    Palpitations 05/12/2017    Priority: Medium    Vitamin B12 deficiency 05/12/2017    Priority: Medium    Cervical radiculopathy 04/03/2016    Priority: Medium    Essential tremor 04/18/2015    Priority: Medium    CKD (chronic kidney disease), stage III (HCC) 10/15/2013    Priority: Medium    Former smoker 12/24/2006    Priority: Medium    Hyperlipidemia 05/26/2006    Priority: Medium    GAD (generalized anxiety disorder) 05/26/2006    Priority: Medium    Essential hypertension 05/26/2006    Priority: Medium    Patellofemoral arthritis 03/05/2018    Priority: Low   Unstable gait 01/20/2018    Priority: Low   Benign neoplasm of colon 06/14/2014    Priority: Low   H/O  cold sores 10/15/2013    Priority: Low   GERD 05/26/2006    Priority: Low   Acute pancreatitis 06/26/2023    Medications- reviewed and updated Current Outpatient Medications  Medication Sig Dispense Refill   acetaminophen  (TYLENOL ) 500 MG tablet Take 1,000 mg by mouth at bedtime.     aspirin EC 81 MG tablet Take 81 mg by mouth at bedtime.      buPROPion  (WELLBUTRIN  XL) 150 MG 24 hr tablet Take 1 tablet (150 mg total) by mouth daily. 30 tablet 5   busPIRone  (BUSPAR ) 10 MG tablet Take 1 tablet (10 mg total) by mouth daily. 30 tablet 5   citalopram  (CELEXA ) 20 MG tablet TAKE ONE TABLET BY MOUTH ONCE DAILY 90 tablet 3   cyanocobalamin  (VITAMIN B12) 1000 MCG tablet Take 1,000 mcg by mouth daily.     ezetimibe  (ZETIA ) 10 MG tablet Take 1 tablet (10 mg total) by mouth daily. 90 tablet 3   magnesium oxide (MAG-OX) 400 (240 Mg) MG tablet Take 250 mg by mouth daily.     pantoprazole  (PROTONIX ) 40 MG tablet Take 1 tablet (40 mg total) by mouth daily. 30 tablet 3   rosuvastatin  (CRESTOR ) 40 MG tablet TAKE ONE TABLET BY MOUTH DAILY 90 tablet 3   Turmeric 500 MG CAPS Take 500 mg by mouth daily.     ascorbic  acid (VITAMIN C) 500 MG tablet Take 500 mg by mouth in the morning. (Patient not taking: Reported on 01/12/2024)     Cholecalciferol (VITAMIN D3) 50 MCG (2000 UT) TABS Take 2,000 Units by mouth in the morning. (Patient not taking: Reported on 01/12/2024)     No current facility-administered medications for this visit.     Objective:  BP 130/62 (BP Location: Left Arm, Patient Position: Sitting, Cuff Size: Normal)   Pulse 62   Temp 98.2 F (36.8 C) (Temporal)   Ht 5' 3 (1.6 m)   Wt 120 lb 9.6 oz (54.7 kg)   LMP  (LMP Unknown)   SpO2 96%   BMI 21.36 kg/m  Gen: NAD, resting comfortably CV: RRR no murmurs rubs or gallops Lungs: CTAB no crackles, wheeze, rhonchi Ext: no edema Skin: warm, dry      Assessment and Plan   # Health maintenance-patient would like to update bone density for  osteopenia  .   # Cervical spinal stenosis S: Spinal stenosis ongoing issue likely affecting gait and balance and neurosurgery has given her 3 months to decide on treatment.  Noted has mild but due to gait instability, hyperreflexia MRI was completed 01/17/2023 showing severe left foraminal stenosis at C3-C4  She had follow up visit with PA that was helpful A/P: She is still undecided on this and symptom(s) largely stable thankfully.   -no neck pain- knee and back more issues than the neck- but stable.    #Hyperlipidemia/aortic atherosclerosis/ history of stroke- on aspirin 81 mg with LDL goal under 70 S: Compliant with rosuvastatin  40 mg and Zetia  10 mg.   Lab Results  Component Value Date   CHOL 116 07/18/2023   HDL 53.50 07/18/2023   LDLCALC 39 07/18/2023   LDLDIRECT 106 (H) 07/19/2016   TRIG 118.0 07/18/2023   CHOLHDL 2 07/18/2023  A/P:cholesterol at goal even with history of stroke- continue current medications    #Hypertension/CKD stage III S: Patient remains off lisinopril  fortunately blood pressure remains controlled  -GFR has been stable largely in the 40s lately- in the past had been in the 30s BP Readings from Last 3 Encounters:  01/12/24 130/62  11/04/23 100/60  10/28/23 122/64  A/P: blood pressure well controlled continue without medicine.  Has lost weight intentionally.   #Primary hyperparathyroidism status post parathyroidectomy January 2021- check calcium  but has done well after parathyroidectomy and hoping this also helps bone density   #GERD S: Compliant with Protonix  40 mg A/P: works well- continue current medications    #B12 deficiency- on oral B12 with relative lows -check b12 Lab Results  Component Value Date   VITAMINB12 262 07/18/2023    #Anxiety/stress management S: Compliant with Celexa  20 mg daily , Wellbutrin  150mg  XR (reduced from 300mg  due to anxiety concern). Buspirone  10 mg dailyhas been helpful thankfully -Patient does not feel she is resting  well but never has- she doesn't panic about this fortunately.  For which she is losing hair due to the stress as well- above issues with neck, her dog has been ill and that's been hard. She has been alcohol free since June still despite this!  A/P: reasonable control despite stressors- we opted to continue current medications. ongoing stress after loss of husband and dog not doing well- offered support to best of my ability  Recommended follow up: Return in about 6 months (around 07/11/2024) for physical or sooner if needed.Schedule b4 you leave.  Lab/Order associations:   ICD-10-CM   1. Osteopenia  of necks of both femurs  M85.851 DG BONE DENSITY (DXA)   M85.852     2. B12 deficiency  E53.8 Vitamin B12    3. Stage 3 chronic kidney disease, unspecified whether stage 3a or 3b CKD (HCC)  N18.30 Comprehensive metabolic panel with GFR    CBC with Differential/Platelet    4. Hyperlipidemia, unspecified hyperlipidemia type  E78.5     5. Essential hypertension  I10     6. Cervical spinal stenosis  M48.02       No orders of the defined types were placed in this encounter.   Return precautions advised.  Garnette Lukes, MD  "

## 2024-01-13 ENCOUNTER — Ambulatory Visit: Payer: Self-pay | Admitting: Family Medicine

## 2024-01-13 LAB — COMPREHENSIVE METABOLIC PANEL WITH GFR
ALT: 8 U/L (ref 3–35)
AST: 14 U/L (ref 5–37)
Albumin: 4.6 g/dL (ref 3.5–5.2)
Alkaline Phosphatase: 54 U/L (ref 39–117)
BUN: 16 mg/dL (ref 6–23)
CO2: 28 meq/L (ref 19–32)
Calcium: 9.5 mg/dL (ref 8.4–10.5)
Chloride: 103 meq/L (ref 96–112)
Creatinine, Ser: 1 mg/dL (ref 0.40–1.20)
GFR: 53.68 mL/min — ABNORMAL LOW
Glucose, Bld: 92 mg/dL (ref 70–99)
Potassium: 4 meq/L (ref 3.5–5.1)
Sodium: 139 meq/L (ref 135–145)
Total Bilirubin: 0.5 mg/dL (ref 0.2–1.2)
Total Protein: 6.9 g/dL (ref 6.0–8.3)

## 2024-01-13 LAB — CBC WITH DIFFERENTIAL/PLATELET
Basophils Absolute: 0.1 K/uL (ref 0.0–0.1)
Basophils Relative: 1.2 % (ref 0.0–3.0)
Eosinophils Absolute: 0.1 K/uL (ref 0.0–0.7)
Eosinophils Relative: 2.5 % (ref 0.0–5.0)
HCT: 38 % (ref 36.0–46.0)
Hemoglobin: 13.1 g/dL (ref 12.0–15.0)
Lymphocytes Relative: 16.4 % (ref 12.0–46.0)
Lymphs Abs: 1 K/uL (ref 0.7–4.0)
MCHC: 34.4 g/dL (ref 30.0–36.0)
MCV: 88.8 fl (ref 78.0–100.0)
Monocytes Absolute: 0.4 K/uL (ref 0.1–1.0)
Monocytes Relative: 6.7 % (ref 3.0–12.0)
Neutro Abs: 4.3 K/uL (ref 1.4–7.7)
Neutrophils Relative %: 73.2 % (ref 43.0–77.0)
Platelets: 219 K/uL (ref 150.0–400.0)
RBC: 4.28 Mil/uL (ref 3.87–5.11)
RDW: 13.3 % (ref 11.5–15.5)
WBC: 5.9 K/uL (ref 4.0–10.5)

## 2024-01-13 LAB — VITAMIN B12: Vitamin B-12: >1500 pg/mL — ABNORMAL HIGH (ref 211–911)

## 2024-04-12 ENCOUNTER — Ambulatory Visit: Admitting: Family Medicine

## 2024-07-13 ENCOUNTER — Encounter: Admitting: Family Medicine
